# Patient Record
Sex: Female | Born: 1950
Health system: Southern US, Community
[De-identification: ages and names within clinical notes are randomized; demographics above are authoritative.]

## PROBLEM LIST (undated history)

## (undated) DIAGNOSIS — C7951 Secondary malignant neoplasm of bone: Secondary | ICD-10-CM

## (undated) DIAGNOSIS — C801 Malignant (primary) neoplasm, unspecified: Secondary | ICD-10-CM

## (undated) DIAGNOSIS — I10 Essential (primary) hypertension: Secondary | ICD-10-CM

## (undated) DIAGNOSIS — E785 Hyperlipidemia, unspecified: Secondary | ICD-10-CM

## (undated) DIAGNOSIS — Z923 Personal history of irradiation: Secondary | ICD-10-CM

## (undated) DIAGNOSIS — M1990 Unspecified osteoarthritis, unspecified site: Secondary | ICD-10-CM

## (undated) DIAGNOSIS — C50919 Malignant neoplasm of unspecified site of unspecified female breast: Secondary | ICD-10-CM

## (undated) HISTORY — DX: Malignant (primary) neoplasm, unspecified: C80.1

## (undated) HISTORY — DX: Essential (primary) hypertension: I10

## (undated) HISTORY — DX: Hyperlipidemia, unspecified: E78.5

## (undated) HISTORY — DX: Unspecified osteoarthritis, unspecified site: M19.90

## (undated) HISTORY — PX: CHOLECYSTECTOMY: SHX55

## (undated) HISTORY — PX: PORTACATH PLACEMENT: SHX2246

## (undated) HISTORY — PX: BREAST SURGERY: SHX581

## (undated) HISTORY — PX: PORT-A-CATH REMOVAL: SHX5289

---

## 2002-01-18 HISTORY — PX: TOTAL HIP ARTHROPLASTY: SHX124

## 2002-01-19 ENCOUNTER — Encounter: Payer: Self-pay | Admitting: Orthopedic Surgery

## 2002-01-19 ENCOUNTER — Inpatient Hospital Stay (HOSPITAL_COMMUNITY): Admission: RE | Admit: 2002-01-19 | Discharge: 2002-01-22 | Payer: Self-pay | Admitting: Orthopedic Surgery

## 2005-01-18 HISTORY — PX: COLONOSCOPY: SHX174

## 2006-01-18 HISTORY — PX: REPLACEMENT TOTAL KNEE: SUR1224

## 2006-01-18 HISTORY — PX: OTHER SURGICAL HISTORY: SHX169

## 2006-12-28 ENCOUNTER — Inpatient Hospital Stay (HOSPITAL_COMMUNITY): Admission: RE | Admit: 2006-12-28 | Discharge: 2007-01-01 | Payer: Self-pay | Admitting: Orthopedic Surgery

## 2008-01-19 HISTORY — PX: MASTECTOMY: SHX3

## 2008-04-25 ENCOUNTER — Encounter: Admission: RE | Admit: 2008-04-25 | Discharge: 2008-04-25 | Payer: Self-pay | Admitting: Surgery

## 2008-04-29 ENCOUNTER — Ambulatory Visit (HOSPITAL_BASED_OUTPATIENT_CLINIC_OR_DEPARTMENT_OTHER): Admission: RE | Admit: 2008-04-29 | Discharge: 2008-04-30 | Payer: Self-pay | Admitting: Surgery

## 2008-04-29 ENCOUNTER — Encounter (INDEPENDENT_AMBULATORY_CARE_PROVIDER_SITE_OTHER): Payer: Self-pay | Admitting: Surgery

## 2008-05-07 ENCOUNTER — Ambulatory Visit: Payer: Self-pay | Admitting: Oncology

## 2008-05-14 ENCOUNTER — Ambulatory Visit: Admission: RE | Admit: 2008-05-14 | Discharge: 2008-07-25 | Payer: Self-pay | Admitting: Radiation Oncology

## 2008-05-30 ENCOUNTER — Ambulatory Visit (HOSPITAL_COMMUNITY): Admission: RE | Admit: 2008-05-30 | Discharge: 2008-05-30 | Payer: Self-pay | Admitting: Oncology

## 2008-06-11 ENCOUNTER — Encounter: Payer: Self-pay | Admitting: Oncology

## 2008-06-11 ENCOUNTER — Ambulatory Visit: Admission: RE | Admit: 2008-06-11 | Discharge: 2008-06-11 | Payer: Self-pay | Admitting: Oncology

## 2008-06-28 ENCOUNTER — Ambulatory Visit: Payer: Self-pay | Admitting: Oncology

## 2008-07-01 ENCOUNTER — Ambulatory Visit (HOSPITAL_COMMUNITY): Admission: RE | Admit: 2008-07-01 | Discharge: 2008-07-01 | Payer: Self-pay | Admitting: Surgery

## 2008-07-12 LAB — CBC WITH DIFFERENTIAL/PLATELET
BASO%: 0.5 % (ref 0.0–2.0)
Basophils Absolute: 0.1 10e3/uL (ref 0.0–0.1)
EOS%: 2 % (ref 0.0–7.0)
Eosinophils Absolute: 0.2 10e3/uL (ref 0.0–0.5)
HCT: 35.5 % (ref 34.8–46.6)
HGB: 11.4 g/dL — ABNORMAL LOW (ref 11.6–15.9)
LYMPH%: 17.3 % (ref 14.0–49.7)
MCH: 26.4 pg (ref 25.1–34.0)
MCHC: 32.1 g/dL (ref 31.5–36.0)
MCV: 82.2 fL (ref 79.5–101.0)
MONO#: 1.1 10e3/uL — ABNORMAL HIGH (ref 0.1–0.9)
MONO%: 12 % (ref 0.0–14.0)
NEUT#: 6.4 10e3/uL (ref 1.5–6.5)
NEUT%: 68.2 % (ref 38.4–76.8)
Platelets: 164 10e3/uL (ref 145–400)
RBC: 4.32 10e6/uL (ref 3.70–5.45)
RDW: 14.2 % (ref 11.2–14.5)
WBC: 9.3 10e3/uL (ref 3.9–10.3)
lymph#: 1.6 10e3/uL (ref 0.9–3.3)

## 2008-07-19 LAB — COMPREHENSIVE METABOLIC PANEL
AST: 18 U/L (ref 0–37)
BUN: 29 mg/dL — ABNORMAL HIGH (ref 6–23)
CO2: 25 mEq/L (ref 19–32)
Calcium: 9.7 mg/dL (ref 8.4–10.5)
Chloride: 105 mEq/L (ref 96–112)
Creatinine, Ser: 1.12 mg/dL (ref 0.40–1.20)

## 2008-07-19 LAB — CBC WITH DIFFERENTIAL/PLATELET
Basophils Absolute: 0 10*3/uL (ref 0.0–0.1)
EOS%: 0.2 % (ref 0.0–7.0)
HCT: 34.8 % (ref 34.8–46.6)
HGB: 11.6 g/dL (ref 11.6–15.9)
MCH: 27.3 pg (ref 25.1–34.0)
NEUT%: 82.9 % — ABNORMAL HIGH (ref 38.4–76.8)
Platelets: 246 10*3/uL (ref 145–400)
lymph#: 1.2 10*3/uL (ref 0.9–3.3)

## 2008-07-23 LAB — CBC WITH DIFFERENTIAL/PLATELET
Basophils Absolute: 0 10*3/uL (ref 0.0–0.1)
Eosinophils Absolute: 0 10*3/uL (ref 0.0–0.5)
HCT: 34.3 % — ABNORMAL LOW (ref 34.8–46.6)
HGB: 11.1 g/dL — ABNORMAL LOW (ref 11.6–15.9)
MONO#: 1 10*3/uL — ABNORMAL HIGH (ref 0.1–0.9)
NEUT%: 80 % — ABNORMAL HIGH (ref 38.4–76.8)
WBC: 11.9 10*3/uL — ABNORMAL HIGH (ref 3.9–10.3)
lymph#: 1.3 10*3/uL (ref 0.9–3.3)

## 2008-08-08 ENCOUNTER — Ambulatory Visit: Payer: Self-pay | Admitting: Oncology

## 2008-08-13 LAB — CBC WITH DIFFERENTIAL/PLATELET
BASO%: 0.1 % (ref 0.0–2.0)
EOS%: 1.8 % (ref 0.0–7.0)
HGB: 11.6 g/dL (ref 11.6–15.9)
MCH: 26.5 pg (ref 25.1–34.0)
MCHC: 32.2 g/dL (ref 31.5–36.0)
MCV: 82.4 fL (ref 79.5–101.0)
NEUT#: 7.4 10*3/uL — ABNORMAL HIGH (ref 1.5–6.5)
NEUT%: 76.2 % (ref 38.4–76.8)
Platelets: 182 10*3/uL (ref 145–400)
RBC: 4.37 10*6/uL (ref 3.70–5.45)
WBC: 9.7 10*3/uL (ref 3.9–10.3)
lymph#: 1.1 10*3/uL (ref 0.9–3.3)

## 2008-08-13 LAB — COMPREHENSIVE METABOLIC PANEL
Albumin: 3.9 g/dL (ref 3.5–5.2)
BUN: 20 mg/dL (ref 6–23)
Calcium: 8.9 mg/dL (ref 8.4–10.5)
Chloride: 107 mEq/L (ref 96–112)
Glucose, Bld: 92 mg/dL (ref 70–99)
Potassium: 4.1 mEq/L (ref 3.5–5.3)

## 2008-09-03 LAB — CBC WITH DIFFERENTIAL/PLATELET
Basophils Absolute: 0 10*3/uL (ref 0.0–0.1)
Eosinophils Absolute: 0.2 10*3/uL (ref 0.0–0.5)
HCT: 34.8 % (ref 34.8–46.6)
HGB: 11.2 g/dL — ABNORMAL LOW (ref 11.6–15.9)
LYMPH%: 10.5 % — ABNORMAL LOW (ref 14.0–49.7)
MONO#: 0.9 10*3/uL (ref 0.1–0.9)
NEUT#: 6.5 10*3/uL (ref 1.5–6.5)
NEUT%: 76.1 % (ref 38.4–76.8)
Platelets: 189 10*3/uL (ref 145–400)
WBC: 8.5 10*3/uL (ref 3.9–10.3)
nRBC: 0 % (ref 0–0)

## 2008-09-04 ENCOUNTER — Ambulatory Visit: Payer: Self-pay | Admitting: Oncology

## 2008-09-10 ENCOUNTER — Ambulatory Visit: Admission: RE | Admit: 2008-09-10 | Discharge: 2008-11-18 | Payer: Self-pay | Admitting: Radiation Oncology

## 2008-10-04 ENCOUNTER — Ambulatory Visit: Payer: Self-pay | Admitting: Genetic Counselor

## 2008-10-04 ENCOUNTER — Ambulatory Visit: Payer: Self-pay | Admitting: Oncology

## 2008-10-04 LAB — CBC WITH DIFFERENTIAL/PLATELET
BASO%: 0.2 % (ref 0.0–2.0)
Basophils Absolute: 0 10*3/uL (ref 0.0–0.1)
EOS%: 4 % (ref 0.0–7.0)
HGB: 11.2 g/dL — ABNORMAL LOW (ref 11.6–15.9)
MCH: 28.8 pg (ref 25.1–34.0)
RDW: 19.7 % — ABNORMAL HIGH (ref 11.2–14.5)
lymph#: 0.5 10*3/uL — ABNORMAL LOW (ref 0.9–3.3)

## 2008-10-18 DIAGNOSIS — Z923 Personal history of irradiation: Secondary | ICD-10-CM

## 2008-10-18 HISTORY — DX: Personal history of irradiation: Z92.3

## 2008-10-21 ENCOUNTER — Encounter: Admission: RE | Admit: 2008-10-21 | Discharge: 2008-12-17 | Payer: Self-pay | Admitting: Radiation Oncology

## 2008-11-07 ENCOUNTER — Ambulatory Visit: Payer: Self-pay | Admitting: Oncology

## 2008-11-11 LAB — CBC WITH DIFFERENTIAL/PLATELET
BASO%: 0.1 % (ref 0.0–2.0)
EOS%: 2 % (ref 0.0–7.0)
HCT: 35.2 % (ref 34.8–46.6)
MCH: 31.3 pg (ref 25.1–34.0)
MCHC: 33.5 g/dL (ref 31.5–36.0)
NEUT%: 80.3 % — ABNORMAL HIGH (ref 38.4–76.8)
RBC: 3.77 10*6/uL (ref 3.70–5.45)
RDW: 13.4 % (ref 11.2–14.5)
lymph#: 0.6 10*3/uL — ABNORMAL LOW (ref 0.9–3.3)

## 2008-12-10 ENCOUNTER — Emergency Department (HOSPITAL_COMMUNITY): Admission: EM | Admit: 2008-12-10 | Discharge: 2008-12-10 | Payer: Self-pay | Admitting: Emergency Medicine

## 2009-01-03 ENCOUNTER — Ambulatory Visit: Payer: Self-pay | Admitting: Oncology

## 2009-07-04 ENCOUNTER — Ambulatory Visit: Payer: Self-pay | Admitting: Oncology

## 2010-01-05 ENCOUNTER — Ambulatory Visit: Payer: Self-pay | Admitting: Oncology

## 2010-04-27 LAB — DIFFERENTIAL
Basophils Absolute: 0 10*3/uL (ref 0.0–0.1)
Eosinophils Relative: 6 % — ABNORMAL HIGH (ref 0–5)
Lymphocytes Relative: 20 % (ref 12–46)
Monocytes Absolute: 0.5 10*3/uL (ref 0.1–1.0)

## 2010-04-27 LAB — COMPREHENSIVE METABOLIC PANEL
AST: 21 U/L (ref 0–37)
Albumin: 3.3 g/dL — ABNORMAL LOW (ref 3.5–5.2)
Alkaline Phosphatase: 116 U/L (ref 39–117)
Chloride: 108 mEq/L (ref 96–112)
GFR calc Af Amer: >60 mL/min (ref >60)
Potassium: 4.1 mEq/L (ref 3.5–5.1)
Sodium: 141 mEq/L (ref 135–145)
Total Bilirubin: 0.3 mg/dL (ref 0.3–1.2)

## 2010-04-27 LAB — CBC
Platelets: 252 10*3/uL (ref 150–400)
WBC: 7.3 10*3/uL (ref 4.0–10.5)

## 2010-04-27 LAB — GLUCOSE, CAPILLARY

## 2010-04-29 LAB — COMPREHENSIVE METABOLIC PANEL
ALT: 27 U/L (ref 0–35)
AST: 27 U/L (ref 0–37)
Alkaline Phosphatase: 108 U/L (ref 39–117)
CO2: 26 mEq/L (ref 19–32)
Chloride: 107 mEq/L (ref 96–112)
Creatinine, Ser: 1.06 mg/dL (ref 0.4–1.2)
GFR calc Af Amer: >60 mL/min (ref >60)
GFR calc non Af Amer: 53 mL/min — ABNORMAL LOW (ref >60)
Potassium: 4.8 mEq/L (ref 3.5–5.1)
Sodium: 141 mEq/L (ref 135–145)
Total Bilirubin: 0.4 mg/dL (ref 0.3–1.2)

## 2010-04-29 LAB — DIFFERENTIAL
Basophils Absolute: 0 10*3/uL (ref 0.0–0.1)
Basophils Relative: 0 % (ref 0–1)
Eosinophils Absolute: 0.3 10*3/uL (ref 0.0–0.7)
Eosinophils Relative: 5 % (ref 0–5)

## 2010-04-29 LAB — CBC
MCV: 90.3 fL (ref 78.0–100.0)
RBC: 4.27 MIL/uL (ref 3.87–5.11)
WBC: 5.9 10*3/uL (ref 4.0–10.5)

## 2010-04-29 LAB — GLUCOSE, CAPILLARY: Glucose-Capillary: 107 mg/dL — ABNORMAL HIGH (ref 70–99)

## 2010-05-21 ENCOUNTER — Encounter (INDEPENDENT_AMBULATORY_CARE_PROVIDER_SITE_OTHER): Payer: Self-pay | Admitting: Surgery

## 2010-06-02 NOTE — Op Note (Signed)
[name], [name]              ACCOUNT NO.:  [id number]   MEDICAL RECORD NO.:  [id number]          PATIENT TYPE:  AMB   LOCATION:  DAY                          FACILITY:  [facility]   PHYSICIAN:  Thomas A. Cornett, M.D.DATE OF BIRTH:  September 28, 1950   DATE OF PROCEDURE:  07/01/2008  DATE OF DISCHARGE:                               OPERATIVE REPORT   PREOPERATIVE DIAGNOSES:  Stage II breast cancer with poor venous access.   POSTOPERATIVE DIAGNOSES:  Stage II breast cancer with poor venous  access.   PROCEDURE:  Placement of right subclavian 8-French Power Port-A-Cath  under fluoroscopy guidance.   SURGEON:  Maisie Fus A. Cornett, M.D.   ANESTHESIA:  LMA of 0.25% Sensorcaine local.   ESTIMATED BLOOD LOSS:  20 mL.   SPECIMEN:  None.   INDICATIONS FOR PROCEDURE:  The patient is a 60 year old female with  stage II breast cancer.  She needs venous access for chemotherapy.  She  presents today for that.  Informed consent was obtained in the office  with her.   DESCRIPTION OF PROCEDURE:  The patient was brought to the operating  room.  She was placed supine.  After induction of LMA anesthesia, both  arms were tucked and the upper chest regions bilaterally were prepped  and draped in sterile fashion.  She was then placed in Trendelenburg.  The right subclavian vein was cannulated easily and the wire was  threaded through the needle without difficulty.  The patient was then  flattened out.  The wire was going into the subclavian vein down to the  superior vena cava and inferior vena cava.  A small incision was made at  the wire entrance site.  A second incision was made below that of about  3 cm.  A small cavity was created with the cautery.  A 8-French Power  Port-A-Cath was brought into the field, attached and then placed in the  wound and placed inside the pocket.  It was then tunneled from the lower  to the upper incision.  It was cut to about 20 cm.  The patient was  placed back in  Trendelenburg.  The dilator and introducer complex was  then fed over the wire moving the wire to-and-fro without difficulty.  Once the introducer was in place the dilator and wire were removed.  The  catheter was then placed within the peel-away sheath and it was peeled  away without difficulty.  The tip the catheter was then examined by  fluoroscopy.  Given her body habitus it was very difficult.  This  appeared to be in the superior vena cava and right atrium junction.  The  catheter was secured to the chest wall with 2-0 Prolene.  We were able  to draw back from the port without difficulty, getting dark non-  pulsatile blood and then flushing it with heparinized saline without  difficulty.  The 5 mL of 100 units/mL of heparinized saline were then  placed in the catheter.  We then closed the incision with a deep layer  of 3-0 Vicryl and subsequent 4-0 Monocryl stitch.  I then accessed  the  catheter with the [facility] needle and this drew back easily and flushed  easily.  This was secured to the skin with Steri-Strips.  Dermabond was placed over both incisions.  All final counts of sponge,  needle and instruments were found to be correct at this portion of the  case.  Sterile dressings were applied.  All final counts were found be  correct x2.  The patient was awoken and taken to recovery where a chest  x-ray will be obtained.  She was in stable condition.      Thomas A. Cornett, M.D.  Electronically Signed     TAC/MEDQ  D:  07/01/2008  T:  07/01/2008  Job:  [id number]   cc:   Leighton Roach. Truett Perna, M.D.  Fax: [phone]

## 2010-06-02 NOTE — Op Note (Signed)
[name], [name]              ACCOUNT NO.:  [id number]   MEDICAL RECORD NO.:  [id number]          PATIENT TYPE:  AMB   LOCATION:  DSC                          FACILITY:  [facility]   PHYSICIAN:  Thomas A. Cornett, M.D.DATE OF BIRTH:  02/18/1950   DATE OF PROCEDURE:  DATE OF DISCHARGE:                               OPERATIVE REPORT   PREOPERATIVE DIAGNOSIS:  Left breast cancer.   POSTOPERATIVE DIAGNOSIS:  Left breast cancer.   PROCEDURE:  1. Left modified radical mastectomy.  2. Left sentinel lymph node mapping with injection of methylene blue      dye.   SURGEON:  Maisie Fus A. Cornett, MD   ANESTHESIA:  LMA.   ESTIMATED BLOOD LOSS:  150 mL.   DRAINS:  A 19-Blake drains to left mastectomy wound.   SPECIMEN:  Left breast with axillary contents to pathology.   INDICATIONS FOR PROCEDURE:  The patient is a 60 year old female who has  two large foci of breast cancer in her left breast.  These were too far  apart for breast conserving means and she presents today for a left  simple mastectomy with sentinel lymph node mapping.  We discussed the  procedure preoperatively and the risk of bleeding, infection, wound  complications, poor wound healing, and other complications related to  surgery including DVT, with pulmonary embolus, myocardial infarction,  stroke, injury to other neighboring organs.  She understood all the  above potential complications and agreed to proceed.   DESCRIPTION OF PROCEDURE:  The patient was brought to the operating room  and placed supine.  After induction of LMA anesthesia, 4 mL of methylene  blue dye were injected in a subareolar position and massaged for 5  minutes after sterile prep.  Once this was done the left breast and left  axilla were prepped and draped in a sterile fashion.  She was injected  with technetium sulfur colloid in the holding area by Radiology.  She  had a very large breast that extended laterally.  Curvilinear incisions  were made  above and below the nipple areolar complex.  Superior skin  flap was taken all the way up to just above the clavicle.  Inferior skin  flap was taken down below the inframammary fold.  The breast was then  excised in a medial-to-lateral fashion off the pectoralis fascia.  The  axilla was entered and a NEO-prep was used to identify the sentinel  node.  This was both blue and hot in the left axilla.  It was found to  be positive.  At this point, we conducted a left axillary node  dissection.  The axillary vein was identified.  The long thoracic nerve  was also identified.  We identified the thoracodorsal trunk.  All  lymphovascular issue between these landmarks were removed with the  specimen passed off the field.  They were all preserved.  Clips were  used to control any small lymphatics or any small veins.  The  intercostal nerves were divided.  We inspected the axillary fossa and it  was dry.  She had a significant dog ear on this side  of the wound due to  her morbid obesity and I was able to trim the skin down as far as I  could to limit the superior inferior skin flaps to make this as flat as  we could without any of the tissue rolling up or bulging.  Irrigation  was used and suctioned out.  We used two separate stab incisions and 19-  Blake drains were placed.  She had a very large breast.  Therefore,  incision extended out well into the axilla to encompass all this and  just to the midline.  We closed the wound in layers, the deep layer with  of 3-0 Vicryl and subsequent 3-0 Monocryl layer.  Dermabond was applied.  Drains were secured to the skin with drain stitches of two 3-0 nylon and  placed to bulb suction.  Dermabond was applied.  All final counts of  sponge, needle, and instruments were found to be correct at this portion  of the case.  The patient was awoken and taken to recovery in  satisfactory condition.      Thomas A. Cornett, M.D.  Electronically Signed      TAC/MEDQ  D:  04/29/2008  T:  04/30/2008  Job:  [id number]   cc:   Mady Gemma, MD

## 2010-06-02 NOTE — Op Note (Signed)
NAMELISETH, WANN              ACCOUNT NO.:  [id number]   MEDICAL RECORD NO.:  [id number]          PATIENT TYPE:  INP   LOCATION:  2550                         FACILITY:  [facility]   PHYSICIAN:  Nadara Mustard, MD     DATE OF BIRTH:  04/29/1950   DATE OF PROCEDURE:  12/28/2006  DATE OF DISCHARGE:                               OPERATIVE REPORT   PREOPERATIVE DIAGNOSIS:  PVNS with degenerative arthritis, right knee.   POSTOPERATIVE DIAGNOSIS:  PVNS with degenerative arthritis, right knee.   PROCEDURE:  Right total knee arthroplasty with synovectomy with DePuy  components, 2.5 femur, 2.5 tibia, with a 10-mm poly tray and a 35-mm  patella.   SURGEON:  Nadara Mustard, MD   ANESTHESIA:  General plus femoral block.   ESTIMATED BLOOD LOSS:  Minimal.   ANTIBIOTICS:  2 grams of Kefzol.   DRAINS:  None.   COMPLICATIONS:  None.   TOURNIQUET TIME:  300 mmHg at approximately 45 minutes to the thigh.   DISPOSITION:  To PACU in stable condition.   INDICATIONS FOR PROCEDURE:  The patient is a 60 year old woman with PVNS  of her knee.  She has had degenerative arthritic changes secondary to  the PVNS.  She is status post arthroscopic debridement.  She has pain  with activities of daily living and presents at this time for total knee  replacement.  Risks and benefits were discussed including infection,  neurovascular injury, persistent pain, need for additional surgery.  The  patient states she understands and wished proceed at this time.   PROCEDURE:  The patient was brought to OR room 4 after undergoing a  femoral block.  She then underwent general anesthetic.  After adequate  level of anesthesia obtained the patient's right lower extremity was  prepped using DuraPrep, draped into a sterile field.  An Collier Flowers was used  to cover all exposed skin.  The knee was flexed and the tourniquet  inflated to 300 mmHg.  A midline incision was made.  This carried down  to the medial parapatellar  retinacular incision.  The patella was  everted and visualization showed a massive synovitis of all  compartments.  The patient underwent a complete synovectomy of the  joint.  The drill was then used for the IM guide up the femur with it  set to 5 degrees of valgus for the right knee.  This distal cutting  block was then placed and 11 mm was taken off the distal femur.  This  was sized for 2.5 and 2.5 cutting block was placed and the chamfer cuts  were made.  Attention was then focused on the tibia.  The external  alignment guide was used and with neutral posterior slope and neutral  varus and valgus alignment with the tibial cut was made at 10 mm.  This  was then sized for 2.5 and 2.5 trial tower and keel punches were made  and the tibial component was left in place.  Attention was then focused  on the femur.  The box cut was then made for the femur.  The trial  femoral component was placed.  The peg holes were drilled and this was  tried with a 10-mm poly tray.  The patient had full extension, stable  varus and valgus alignment.  The trial instruments removed.  The knee  was irrigated with pulse lavage.  The patella was then resurfaced and 10  mm was taken off the patella.  This was sized for a 35 and the peg cuts  were made for the 35-mm patella.  The knee was again irrigated with  pulse lavage.  The tibial and then femoral components were cemented in  place.  The loose cement was removed and the poly tray was placed after  again irrigation and the knee was kept in extension until the cement  hardened.  The patella button was placed and this was left with the  clamp in place after cement was removed and this was also left in place  until the cement hardened.  The knee was then placed through full range  of motion. There was no lateral tracking of the patella.  No lateral  tilt.  The patient's knee was stable.  The tourniquet was deflated and  hemostasis was obtained.  The posterior  capsule of the joint was  injected with total 60 mL of 0.25% Marcaine plain.  The retinacular  incision was closed using #1 Vicryl, the subcu was closed using 2-0  Vicryl.  Skin was closed using Proximate staples.  The wound was covered  Adaptic orthopedic sponges, ABD dressing, Webril and Coban dressing.  The patient was placed in easy wrap ice pack, extubated and taken to  PACU in stable condition      Nadara Mustard, MD  Electronically Signed     MVD/MEDQ  D:  12/28/2006  T:  12/28/2006  Job:  [id number]

## 2010-06-05 NOTE — H&P (Signed)
NAME:  [name], CHIEFFO                        ACCOUNT NO.:  [id number]   MEDICAL RECORD NO.:  [id number]                   PATIENT TYPE:  INP   LOCATION:  2899                                 FACILITY:  [facility]   PHYSICIAN:  Nadara Mustard, M.D.                DATE OF BIRTH:  07/12/1950   DATE OF ADMISSION:  01/19/2002  DATE OF DISCHARGE:                                HISTORY & PHYSICAL   HISTORY OF PRESENT ILLNESS:  The patient is a 60 year old woman with a  chronic right hip pain.  The patient has a decreased range of motion, has  chronic pain with activities of daily living.  The patient complains of  weakness, inability to ambulate, and presents at this time for a total hip  arthroplasty after a failure of conservative care, anti-inflammatories, and  cortisone injections.   ALLERGIES:  No known drug allergies.   MEDICATIONS:  Include Accupril, Vicodin, Flexeril, and glipizide, as well as  sulfasalizine and Arava.   PAST MEDICAL HISTORY:  Positive for cholecystectomy in 1990.   SOCIAL HISTORY:  Negative for tobacco, negative for alcohol.  She is  married.   FAMILY HISTORY:  Positive for diabetes, heart disease, and hypertension.   REVIEW OF SYSTEMS:  Positive for rheumatoid arthritis, diabetes type 2, and  hypertension.   PHYSICAL EXAMINATION:  VITAL SIGNS:  Temperature 98.1, pulse 64, respiratory  rate 16, blood pressure 142/90.  GENERAL:  She is well-developed, well-nourished, morbidly obese.  NECK:  Supple, no bruits.  LUNGS:  Clear to auscultation.  CARDIOVASCULAR:  Regular rate and rhythm.  RIGHT LOWER EXTREMITY:  She has approximately 10 to 20 degrees of external  rotation which is extremely painful.  She has 0 degrees of internal  rotation.  She has flexion of 90 degrees.  She has pain with straightening  of her hip.  She has a shortening of the right leg by about 1/4 to 1/2 inch.  She has no pain to palpation around the right knee.   IMAGING STUDIES:  She  does have an MRI scan of her right knee which does  show a meniscal tear but she is asymptomatic to palpation and range of  motion of the knee.  Radiographs of the right hip shows severe arthritic  changes with subchondral cysts and bone-on-bone contact.   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    PLAN:  The patient is scheduled for a right total hip arthroplasty at this  time.  The risks and benefits were discussed including infection,  neurovascular injury, persistent pain, injury to the sciatic nerve,  dislocation of the hip, DVT, pulmonary embolus and death.  The patient  states she understands and wishes to proceed at this time.  Nadara Mustard, M.D.    MVD/MEDQ  D:  01/19/2002  T:  01/19/2002  Job:  [id number]

## 2010-06-05 NOTE — Op Note (Signed)
NAME:  [name], [name]                        ACCOUNT NO.:  [id number]   MEDICAL RECORD NO.:  [id number]                   PATIENT TYPE:  INP   LOCATION:  2899                                 FACILITY:  [facility]   PHYSICIAN:  Nadara Mustard, M.D.                DATE OF BIRTH:  1950/03/20   DATE OF PROCEDURE:  01/19/2002  DATE OF DISCHARGE:                                 OPERATIVE REPORT   PREOPERATIVE DIAGNOSIS:  Osteoarthritis right hip versus rheumatoid  arthritis.   POSTOPERATIVE DIAGNOSIS:  Osteoarthritis right hip versus rheumatoid  arthritis.   OPERATION PERFORMED:  Right total hip arthroplasty with a #7 press-fit  secure fit stem, 32 mm ceramic ball with a 48 mm acetabulum with a 10 degree  poly liner.   SURGEON:  Nadara Mustard, M.D.   ANESTHESIA:  General.   ESTIMATED BLOOD LOSS:  300 cc.   ANTIBIOTICS:  1 gm of Kefzol.   DISPOSITION:  To PACU in stable condition.   INDICATIONS FOR PROCEDURE:  The patient is a 60 year old woman with severe  arthritic changes of her right hip.  The patient has failed conservative  care.  She is unable to perform activities of daily living and presents at  this time for total hip arthroplasty.  The risks and benefits were  discussed.  The patient states she understands and wishes to proceed at this  time.   DESCRIPTION OF PROCEDURE:  The patient was brought to operating room 5 and  underwent a general anesthetic.  After adequate level of anesthesia was  obtained, the patient was placed in left lateral decubitus position with the  right side up and the right lower extremity was prepped using DuraPrep,  draped into a sterile field and Ioban was used to cover all exposed skin.  A  posterolateral incision was made.  This was carried down to the tensor  fascia lata which was split.  A Charnley retractor was placed.  The sciatic  nerve was protected and was very close to the surgical field.  The short  external rotators and the  piriformis were tagged and retracted and were used  to protect the sciatic nerve.  The capsule was T-d and this was also tagged  and retracted.  The hip was dislocated and a femoral neck cut was made one  half inch proximal to the calcar.  Attention was first focused on the  acetabulum.  The patient had a significant amount of synovitis which was  debrided.  The acetabulum was sequentially reamed up to 48 mm and the 48 mm  acetabulum was inserted and the trial liner was placed.  Attention was then  focused on the femur.  The femur was sequentially reamed and broached up to  a #7.  This was then reduced with a  #7 trial and placed through a full  range of motion.  The patient had 90 degrees  of flexion, had 70 degrees of  internal rotation, was stable.  She also had full extension with external  rotation and this was also stable.  Leg lengths were equal.  The trials were  removed.  The centralizer plug was placed on the acetabulum.  The acetabular  liner was placed and the femoral canal was then reamed distally to 10.5 for  an 11 mm distal stem.  The stem was inserted as the ceramic head was  inserted and the hip was reduced.  The hip was again placed through a range  of motion and was stable.  The wound was irrigated throughout the case with  normal saline.  The deep fascia iliotibial band was closed using a #1  Vicryl.  The deep fascia was closed using a 2-0 Vicryl.  The superficial  fascia was closed with 2-0 Vicryl.  The skin was closed using Proximate  staples.  The wounds were covered with Adaptic orthopedic sponges, ABD  dressing and HypaFix tape.  The patient was extubated, transferred supine.  Knee immobilizer was applied and she was taken to PACU in stable condition.                                                Nadara Mustard, M.D.    MVD/MEDQ  D:  01/19/2002  T:  01/19/2002  Job:  [id number]

## 2010-06-05 NOTE — Discharge Summary (Signed)
   NAME:  [name], [name]                        ACCOUNT NO.:  [id number]   MEDICAL RECORD NO.:  [id number]                   PATIENT TYPE:  INP   LOCATION:  5037                                 FACILITY:  [facility]   PHYSICIAN:  Nadara Mustard, M.D.                DATE OF BIRTH:  12-26-1950   DATE OF ADMISSION:  01/19/2002  DATE OF DISCHARGE:  01/22/2002                                 DISCHARGE SUMMARY   DISPOSITION:  The patient is discharged to home in stable condition.   DIAGNOSIS:  Osteoarthritis of the right hip.   PROCEDURE:  Right total hip arthroplasty.   FOLLOW UP:  The patient is to follow-up in the office in two weeks.   HISTORY OF PRESENT ILLNESS:  The patient is a 60 year-old woman with chronic  right hip pain.  She has decreased range of motion and is unable to perform  activities of daily living.  She has failed conservative care and presents  at this time for a total hip arthroplasty.   HOSPITAL COURSE:  The patient underwent a right total hip arthroplasty on  January 19, 2002 with Osteonics components, #7 femur, 32 mm ceramic ball with  a 48 mm acetabulum.  She was treated with Kefzol for infection prophylaxis  and Coumadin for DVT prophylaxis.  She progressed well with physical therapy  and was discharged to home in stable condition on January 22, 2002 with home  physical therapy, weight bearing as tolerated, discharge on low dose  Coumadin with follow-up in the office in two weeks.                                               Nadara Mustard, M.D.    MVD/MEDQ  D:  02/22/2002  T:  02/22/2002  Job:  [id number]

## 2010-07-07 ENCOUNTER — Encounter (HOSPITAL_BASED_OUTPATIENT_CLINIC_OR_DEPARTMENT_OTHER): Payer: 59 | Admitting: Oncology

## 2010-07-07 DIAGNOSIS — M069 Rheumatoid arthritis, unspecified: Secondary | ICD-10-CM

## 2010-07-07 DIAGNOSIS — Z170 Estrogen receptor positive status [ER+]: Secondary | ICD-10-CM

## 2010-07-07 DIAGNOSIS — E119 Type 2 diabetes mellitus without complications: Secondary | ICD-10-CM

## 2010-07-07 DIAGNOSIS — C50919 Malignant neoplasm of unspecified site of unspecified female breast: Secondary | ICD-10-CM

## 2010-10-26 LAB — BASIC METABOLIC PANEL
BUN: 19
CO2: 24
CO2: 28
Calcium: 8.7
Calcium: 9
Chloride: 104
Chloride: 104
Chloride: 106
Creatinine, Ser: 0.92
Creatinine, Ser: 1.06
GFR calc Af Amer: >60
GFR calc non Af Amer: >60
Glucose, Bld: 121 — ABNORMAL HIGH
Glucose, Bld: 134 — ABNORMAL HIGH
Potassium: 4.4
Sodium: 136
Sodium: 139
Sodium: 141

## 2010-10-26 LAB — CBC
HCT: 26.5 — ABNORMAL LOW
HCT: 27.3 — ABNORMAL LOW
HCT: 33.8 — ABNORMAL LOW
Hemoglobin: 11.3 — ABNORMAL LOW
Hemoglobin: 8.1 — ABNORMAL LOW
MCHC: 32.8
MCHC: 33.1
MCV: 80.8
MCV: 81.3
MCV: 81.7
Platelets: 234
Platelets: 281
RDW: 17.8 — ABNORMAL HIGH
RDW: 17.9 — ABNORMAL HIGH
RDW: 18 — ABNORMAL HIGH
WBC: 11.1 — ABNORMAL HIGH
WBC: 8.8

## 2010-10-26 LAB — PROTIME-INR
INR: 1.4
INR: 2.4 — ABNORMAL HIGH
Prothrombin Time: 23.6 — ABNORMAL HIGH

## 2010-10-27 ENCOUNTER — Encounter (INDEPENDENT_AMBULATORY_CARE_PROVIDER_SITE_OTHER): Payer: Self-pay | Admitting: Surgery

## 2010-11-30 ENCOUNTER — Ambulatory Visit (INDEPENDENT_AMBULATORY_CARE_PROVIDER_SITE_OTHER): Payer: Self-pay | Admitting: Surgery

## 2011-01-01 ENCOUNTER — Encounter (INDEPENDENT_AMBULATORY_CARE_PROVIDER_SITE_OTHER): Payer: Self-pay | Admitting: Surgery

## 2011-01-01 ENCOUNTER — Ambulatory Visit (INDEPENDENT_AMBULATORY_CARE_PROVIDER_SITE_OTHER): Payer: 59 | Admitting: Surgery

## 2011-01-01 VITALS — BP 128/74 | HR 76 | Temp 97.4°F | Resp 12 | Ht 63.25 in | Wt 250.6 lb

## 2011-01-01 DIAGNOSIS — Z853 Personal history of malignant neoplasm of breast: Secondary | ICD-10-CM

## 2011-01-01 NOTE — Patient Instructions (Signed)
Follow up 1 year 

## 2011-01-01 NOTE — Progress Notes (Signed)
Subjective:     Patient ID: [name], female   DOB: 01-03-1951, 60 y.o.   MRN: [id number]  HPIThe patient returns today due to history of stage II left breast cancer. Date of surgery was 2010 and she underwent a left modified radical mastectomy. She completed chemotherapy. She is doing well except for small area of skin irritation along the mastectomy wound. This is been present for 2 weeks.  Review of Systems  Constitutional: Negative for fever, chills and unexpected weight change.  HENT: Negative for hearing loss, congestion, sore throat, trouble swallowing and voice change.   Eyes: Negative for visual disturbance.  Respiratory: Negative for cough and wheezing.   Cardiovascular: Negative for chest pain, palpitations and leg swelling.  Gastrointestinal: Negative for nausea, vomiting, abdominal pain, diarrhea, constipation, blood in stool, abdominal distention and anal bleeding.  Genitourinary: Negative for hematuria, vaginal bleeding and difficulty urinating.  Musculoskeletal: Negative for arthralgias.  Skin: Negative for rash and wound.  Neurological: Negative for seizures, syncope and headaches.  Hematological: Negative for adenopathy. Does not bruise/bleed easily.  Psychiatric/Behavioral: Negative for confusion.       Objective:   Physical Exam  Constitutional: She is oriented to person, place, and time. She appears well-developed and well-nourished.  HENT:  Head: Normocephalic and atraumatic.  Eyes: EOM are normal. Pupils are equal, round, and reactive to light.  Neck: Normal range of motion. Neck supple.  Pulmonary/Chest:       Left breast surgically absent. No mass. Right breast normal.  Musculoskeletal: Normal range of motion.  Neurological: She is alert and oriented to person, place, and time.       Assessment:     Stage II left breast cancer history    Plan:     Return to clinic one year. Her rheumatologist wants to start  embrel. This will be up to Dr.  Elnita Maxwell and the patient

## 2011-01-05 ENCOUNTER — Other Ambulatory Visit: Payer: Self-pay | Admitting: *Deleted

## 2011-01-05 ENCOUNTER — Ambulatory Visit (HOSPITAL_BASED_OUTPATIENT_CLINIC_OR_DEPARTMENT_OTHER): Payer: 59 | Admitting: Oncology

## 2011-01-05 VITALS — BP 119/63 | HR 87 | Temp 97.0°F | Ht 63.25 in | Wt 249.5 lb

## 2011-01-05 DIAGNOSIS — C50919 Malignant neoplasm of unspecified site of unspecified female breast: Secondary | ICD-10-CM

## 2011-01-05 DIAGNOSIS — Z79811 Long term (current) use of aromatase inhibitors: Secondary | ICD-10-CM

## 2011-01-05 DIAGNOSIS — Z9010 Acquired absence of unspecified breast and nipple: Secondary | ICD-10-CM

## 2011-01-05 DIAGNOSIS — Z9221 Personal history of antineoplastic chemotherapy: Secondary | ICD-10-CM

## 2011-01-05 MED ORDER — ANASTROZOLE 1 MG PO TABS: 1.0000 mg | ORAL_TABLET | Freq: Every day | ORAL | Status: DC

## 2011-01-05 NOTE — Progress Notes (Signed)
OFFICE PROGRESS NOTE   INTERVAL HISTORY:   She continues  arimidex. She exercises regularly. She does not have significant hot flashes or arthralgias. The rheumatoid arthritis is currently under good control. She has no new complaint.  Objective:  Vital signs in last 24 hours:  Blood pressure 119/63, pulse 87, temperature 97 F (36.1 C), temperature source Oral, height 5' 3.25" (1.607 m), weight 249 lb 8 oz (113.172 kg).    HEENT: Neck without mass Lymphatics: No cervical, supraclavicular, or axillary lymph nodes Resp: Lungs clear bilaterally Cardio: Regular rate and rhythm GI: No hepato- Vascular:  No leg edema, mild edema of the left arm  Breasts: Status post left mastectomy. No evidence for chest wall tumor recurrence. Right breast without mass.    Medications: I have reviewed the patient's current medications.  Assessment/Plan: 1. 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  She completed 4 cycles of adjuvant AC chemotherapy June 15 through September 03, 2008, and then completed the left chest wall radiation.  She began Arimidex following an office visit on November 11, 2008. 2. Left chest subcutaneous mass noted on exam October 04, 2008, status post biopsy by Dr. Luisa Hart with a benign pathology. 3. History of delayed healing of the left mastectomy incision. 4. Rheumatoid arthritis. 5. Diabetes. 6. Hypercholesterolemia. 7. Hypertension. 8. Family history of breast cancer.  She has been evaluated at the genetic screening clinic.  Disposition:  She remains in clinical remission from breast cancer. She will continue Arimidex. She is scheduled for a return visit in 6 months.   Lucile Shutters, MD  01/05/2011  8:59 PM

## 2011-01-06 ENCOUNTER — Telehealth: Payer: Self-pay | Admitting: Oncology

## 2011-01-06 NOTE — Telephone Encounter (Signed)
[facility] advising the pt of her f/u appt in June 2013

## 2011-01-21 ENCOUNTER — Other Ambulatory Visit: Payer: Self-pay | Admitting: *Deleted

## 2011-01-21 DIAGNOSIS — C50919 Malignant neoplasm of unspecified site of unspecified female breast: Secondary | ICD-10-CM

## 2011-01-21 DIAGNOSIS — C50419 Malignant neoplasm of upper-outer quadrant of unspecified female breast: Secondary | ICD-10-CM

## 2011-01-21 MED ORDER — ANASTROZOLE 1 MG PO TABS: 1.0000 mg | ORAL_TABLET | Freq: Every day | ORAL | Status: DC

## 2011-05-28 ENCOUNTER — Encounter (INDEPENDENT_AMBULATORY_CARE_PROVIDER_SITE_OTHER): Payer: Self-pay

## 2011-07-06 ENCOUNTER — Telehealth: Payer: Self-pay | Admitting: Oncology

## 2011-07-06 ENCOUNTER — Ambulatory Visit (HOSPITAL_BASED_OUTPATIENT_CLINIC_OR_DEPARTMENT_OTHER): Payer: 59 | Admitting: Nurse Practitioner

## 2011-07-06 VITALS — BP 125/66 | HR 86 | Temp 98.1°F | Ht 63.25 in | Wt 254.8 lb

## 2011-07-06 DIAGNOSIS — E785 Hyperlipidemia, unspecified: Secondary | ICD-10-CM

## 2011-07-06 DIAGNOSIS — E119 Type 2 diabetes mellitus without complications: Secondary | ICD-10-CM

## 2011-07-06 DIAGNOSIS — M069 Rheumatoid arthritis, unspecified: Secondary | ICD-10-CM

## 2011-07-06 DIAGNOSIS — C50919 Malignant neoplasm of unspecified site of unspecified female breast: Secondary | ICD-10-CM

## 2011-07-06 NOTE — Progress Notes (Signed)
OFFICE PROGRESS NOTE  Interval history:  [name] returns as scheduled. She continues Arimidex. She denies significant hot flashes or arthralgias. She feels the rheumatoid arthritis is under good control. She continues methotrexate. No unusual areas of pain. She has a good appetite. No nausea or vomiting. Bowels moving regularly. Her main complaint is fatigue. She relates the fatigue to traveling.   Objective: Blood pressure 125/66, pulse 86, temperature 98.1 F (36.7 C), temperature source Oral, height 5' 3.25" (1.607 m), weight 254 lb 12.8 oz (115.577 kg).  Oropharynx is without thrush or ulceration. No palpable cervical, supraclavicular or axillary lymph nodes. Status post left mastectomy. No evidence for chest wall tumor recurrence. No mass palpated in the right breast. Lungs clear. Regular cardiac rhythm. Abdomen is soft, obese. No obvious hepatomegaly. Legs without edema.  Lab Results: Lab Results  Component Value Date   WBC 7.3 11/11/2008   HGB 11.8 11/11/2008   HCT 35.2 11/11/2008   MCV 93.3 11/11/2008   PLT 209 11/11/2008    Chemistry:    Chemistry      Component Value Date/Time   NA 138 08/13/2008 1026   K 4.1 08/13/2008 1026   CL 107 08/13/2008 1026   CO2 24 08/13/2008 1026   BUN 20 08/13/2008 1026   CREATININE 1.13 08/13/2008 1026      Component Value Date/Time   CALCIUM 8.9 08/13/2008 1026   ALKPHOS 106 08/13/2008 1026   AST 31 08/13/2008 1026   ALT 32 08/13/2008 1026   BILITOT 0.5 08/13/2008 1026       Studies/Results: Mammogram 05/06/2011 with no significant abnormality or change.  Medications: I have reviewed the patient's current medications.  Assessment/Plan:  1. 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 She completed 4 cycles of adjuvant AC chemotherapy June 15 through September 03, 2008, and then completed the left chest wall  radiation. She began Arimidex following an office visit on November 11, 2008. 2. Left chest subcutaneous mass noted on exam October 04, 2008, status post biopsy by Dr. Luisa Hart with a benign pathology. 3. History of delayed healing of the left mastectomy incision. 4. Rheumatoid arthritis. 5. Diabetes. 6. Hypercholesterolemia. 7. Hypertension. 8. Family history of breast cancer. She has been evaluated at the genetic screening clinic.  Disposition-[name] appears stable. She remains in clinical remission from the breast cancer. She continues Arimidex. She will return for a followup visit in 6 months. She will contact the office in the interim with any problems.  Plan reviewed with Dr. Truett Perna.  [name] ANP/GNP-BC

## 2011-07-06 NOTE — Telephone Encounter (Signed)
Gave pt appt  Calendar for December 2013 MD only

## 2011-12-30 ENCOUNTER — Ambulatory Visit: Payer: 59 | Admitting: Oncology

## 2012-01-04 ENCOUNTER — Telehealth: Payer: Self-pay | Admitting: Oncology

## 2012-01-04 ENCOUNTER — Ambulatory Visit (HOSPITAL_BASED_OUTPATIENT_CLINIC_OR_DEPARTMENT_OTHER): Payer: 59 | Admitting: Oncology

## 2012-01-04 VITALS — BP 128/68 | HR 84 | Temp 96.6°F | Resp 20 | Ht 63.25 in | Wt 266.4 lb

## 2012-01-04 DIAGNOSIS — Z9010 Acquired absence of unspecified breast and nipple: Secondary | ICD-10-CM

## 2012-01-04 DIAGNOSIS — C50919 Malignant neoplasm of unspecified site of unspecified female breast: Secondary | ICD-10-CM | POA: Insufficient documentation

## 2012-01-04 DIAGNOSIS — L539 Erythematous condition, unspecified: Secondary | ICD-10-CM

## 2012-01-04 DIAGNOSIS — Z803 Family history of malignant neoplasm of breast: Secondary | ICD-10-CM

## 2012-01-04 MED ORDER — ANASTROZOLE 1 MG PO TABS: 1.0000 mg | ORAL_TABLET | Freq: Every day | ORAL | Status: DC

## 2012-01-04 NOTE — Progress Notes (Signed)
   [facility]    OFFICE PROGRESS NOTE   INTERVAL HISTORY:   She returns as scheduled. She continues Arimidex. No new complaint. No change at the chest wall. She continues to have a few small open areas at the left posterior lateral chest wall. She is scheduled to see Dr. Luisa Hart next month. A right mammogram was negative on 05/06/2011  Objective:  Vital signs in last 24 hours:  Blood pressure 128/68, pulse 84, temperature 96.6 F (35.9 C), temperature source Oral, resp. rate 20, height 5' 3.25" (1.607 m), weight 266 lb 6.4 oz (120.838 kg).    HEENT: Neck without mass Lymphatics: No cervical, supraclavicular, or axillary nodes Resp: Lungs clear bilaterally Cardio: Regular rate and rhythm GI: No hepatomegaly Vascular: No leg edema Breasts: Right breast without mass. Status post left mastectomy. No evidence for chest wall tumor recurrence.  Skin: There are 2-3  1 cm erythematous areas with superficial ulceration at the left posterior lateral chest wall.    Medications: I have reviewed the patient's current medications.  Assessment/Plan: 1. 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 She completed 4 cycles of adjuvant AC chemotherapy June 15 through September 03, 2008, and then completed the left chest wall radiation. She began Arimidex following an office visit on November 11, 2008. 2. Left chest subcutaneous mass noted on exam October 04, 2008, status post biopsy by Dr. Luisa Hart with a benign pathology. 3. History of delayed healing of the left mastectomy incision. 4. Rheumatoid arthritis. 5. Diabetes. 6. Hypercholesterolemia. 7. Hypertension. 8. Family history of breast cancer. She has been evaluated at the genetic screening clinic.   Disposition:  She remains in clinical remission from breast cancer. She will continue Arimidex.  [name] will see Dr. Luisa Hart next month. I suspect the erythematous areas over the left chest wall are related to benign skin abrasions from her bra, potentially complicated by treatment for the rheumatoid arthritis. She will return for an office visit in 6 months. She will schedule a right mammogram for April 2014.   Thornton Papas, MD  01/04/2012  12:33 PM

## 2012-01-04 NOTE — Telephone Encounter (Signed)
gv and printed pt appt schedule for June 2014 °

## 2012-02-04 ENCOUNTER — Encounter (INDEPENDENT_AMBULATORY_CARE_PROVIDER_SITE_OTHER): Payer: Self-pay | Admitting: Surgery

## 2012-02-04 ENCOUNTER — Ambulatory Visit (INDEPENDENT_AMBULATORY_CARE_PROVIDER_SITE_OTHER): Payer: 59 | Admitting: Surgery

## 2012-02-04 VITALS — BP 128/84 | Temp 97.2°F | Ht 63.3 in | Wt 263.8 lb

## 2012-02-04 DIAGNOSIS — Z853 Personal history of malignant neoplasm of breast: Secondary | ICD-10-CM | POA: Insufficient documentation

## 2012-02-04 NOTE — Progress Notes (Signed)
Subjective:     Patient ID: [name], female   DOB: 1950-06-28, 62 y.o.   MRN: [id number]  HPIThe patient returns today due to history of stage II left breast cancer. Date of surgery was 2010 and she underwent a left modified radical mastectomy. She completed chemotherapy. She is doing well except for small area of skin irritation along the mastectomy wound. This is been present for 2 weeks.  Review of Systems  Constitutional: Negative for fever, chills and unexpected weight change.  HENT: Negative for hearing loss, congestion, sore throat, trouble swallowing and voice change.   Eyes: Negative for visual disturbance.  Respiratory: Negative for cough and wheezing.   Cardiovascular: Negative for chest pain, palpitations and leg swelling.  Gastrointestinal: Negative for nausea, vomiting, abdominal pain, diarrhea, constipation, blood in stool, abdominal distention and anal bleeding.  Genitourinary: Negative for hematuria, vaginal bleeding and difficulty urinating.  Musculoskeletal: Negative for arthralgias.  Skin: Negative for rash and wound.  Neurological: Negative for seizures, syncope and headaches.  Hematological: Negative for adenopathy. Does not bruise/bleed easily.  Psychiatric/Behavioral: Negative for confusion.       Objective:   Physical Exam  Constitutional: She is oriented to person, place, and time. She appears well-developed and well-nourished.  HENT:  Head: Normocephalic and atraumatic.  Eyes: EOM are normal. Pupils are equal, round, and reactive to light.  Neck: Normal range of motion. Neck supple.  Pulmonary/Chest:       Left breast surgically absent. No mass. Right breast normal.  Musculoskeletal: Normal range of motion.  Neurological: She is alert and oriented to person, place, and time.  Mammogram UTD.  Due in April 2014       Assessment:     Stage II left breast cancer history    Plan:     Return to clinic one year.

## 2012-02-04 NOTE — Patient Instructions (Signed)
Return as needed

## 2012-06-20 ENCOUNTER — Telehealth: Payer: Self-pay | Admitting: Oncology

## 2012-06-20 ENCOUNTER — Ambulatory Visit (HOSPITAL_BASED_OUTPATIENT_CLINIC_OR_DEPARTMENT_OTHER): Payer: 59 | Admitting: Oncology

## 2012-06-20 VITALS — BP 138/80 | HR 88 | Temp 96.8°F | Resp 19 | Ht 63.0 in | Wt 262.2 lb

## 2012-06-20 DIAGNOSIS — Z803 Family history of malignant neoplasm of breast: Secondary | ICD-10-CM

## 2012-06-20 DIAGNOSIS — C50912 Malignant neoplasm of unspecified site of left female breast: Secondary | ICD-10-CM

## 2012-06-20 DIAGNOSIS — C50919 Malignant neoplasm of unspecified site of unspecified female breast: Secondary | ICD-10-CM

## 2012-06-20 DIAGNOSIS — C773 Secondary and unspecified malignant neoplasm of axilla and upper limb lymph nodes: Secondary | ICD-10-CM

## 2012-06-20 DIAGNOSIS — I10 Essential (primary) hypertension: Secondary | ICD-10-CM

## 2012-06-20 DIAGNOSIS — E119 Type 2 diabetes mellitus without complications: Secondary | ICD-10-CM

## 2012-06-20 NOTE — Telephone Encounter (Signed)
gv and printed appt sched and avs for pt  °

## 2012-06-20 NOTE — Progress Notes (Signed)
   [facility]    OFFICE PROGRESS NOTE   INTERVAL HISTORY:   She returns as scheduled. She continues Arimidex. No change at the chest wall. She is scheduled for a right mammogram on 06/22/2012. She continues an exercise program.  Objective:  Vital signs in last 24 hours:  Blood pressure 138/80, pulse 88, temperature 96.8 F (36 C), temperature source Oral, resp. rate 19, height 5\' 3"  (1.6 m), weight 262 lb 3.2 oz (118.933 kg).    HEENT: Neck without mass Lymphatics: No cervical, supraclavicular, or axillary nodes Resp: end inspiratory bronchial sounds bilaterally, no respiratory distress Cardio: Regular rate and rhythm GI: No hepatomegaly Vascular: No leg edema Breasts: Status post left mastectomy. No evidence for chest wall tumor recurrence. Right breast without mass.    Medications: I have reviewed the patient's current medications.  Assessment/Plan: 1. 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 She completed 4 cycles of adjuvant AC chemotherapy June 15 through September 03, 2008, and then completed the left chest wall radiation. She began Arimidex following an office visit on November 11, 2008. 2. Left chest subcutaneous mass noted on exam October 04, 2008, status post biopsy by Dr. Luisa Hart with a benign pathology. 3. History of delayed healing of the left mastectomy incision. 4. Rheumatoid arthritis. 5. Diabetes. 6. Hypercholesterolemia. 7. Hypertension. 8. Family history of breast cancer. She has been evaluated at the genetic screening clinic.  Disposition:  She remains in clinical remission from breast cancer. She will have a right mammogram later this week. Ms. Vicie Mutters will return for an office visit in 6 months. The plan is to continue Arimidex for at least 5 years.   Thornton Papas, MD  06/20/2012  10:41 AM

## 2012-07-19 ENCOUNTER — Encounter: Payer: Self-pay | Admitting: Oncology

## 2012-12-21 ENCOUNTER — Ambulatory Visit (HOSPITAL_BASED_OUTPATIENT_CLINIC_OR_DEPARTMENT_OTHER): Payer: 59 | Admitting: Oncology

## 2012-12-21 ENCOUNTER — Telehealth: Payer: Self-pay | Admitting: Oncology

## 2012-12-21 VITALS — BP 122/66 | HR 96 | Temp 97.1°F | Resp 18 | Ht 63.0 in | Wt 254.4 lb

## 2012-12-21 DIAGNOSIS — C773 Secondary and unspecified malignant neoplasm of axilla and upper limb lymph nodes: Secondary | ICD-10-CM

## 2012-12-21 DIAGNOSIS — C50912 Malignant neoplasm of unspecified site of left female breast: Secondary | ICD-10-CM

## 2012-12-21 DIAGNOSIS — E119 Type 2 diabetes mellitus without complications: Secondary | ICD-10-CM

## 2012-12-21 DIAGNOSIS — Z803 Family history of malignant neoplasm of breast: Secondary | ICD-10-CM

## 2012-12-21 DIAGNOSIS — Z170 Estrogen receptor positive status [ER+]: Secondary | ICD-10-CM

## 2012-12-21 DIAGNOSIS — I10 Essential (primary) hypertension: Secondary | ICD-10-CM

## 2012-12-21 DIAGNOSIS — C50919 Malignant neoplasm of unspecified site of unspecified female breast: Secondary | ICD-10-CM

## 2012-12-21 NOTE — Progress Notes (Signed)
   [name]    OFFICE PROGRESS NOTE   INTERVAL HISTORY:   She returns for scheduled followup of breast cancer. She continues Arimidex. A right mammogram on 06/22/2012 was negative. No hot flashes. She continues to have stiffness and arthritis pain. Good appetite and energy level. She exercises regularly. Objective:  Vital signs in last 24 hours:  Blood pressure 122/66, pulse 96, temperature 97.1 F (36.2 C), temperature source Oral, resp. rate 18, height 5\' 3"  (1.6 m), weight 254 lb 6.4 oz (115.395 kg).    HEENT: Neck without mass Lymphatics: No cervical, supraclavicular, or axillary nodes Resp: Lungs clear bilaterally Cardio: Regular rate and rhythm GI: No hepatomegaly Vascular: No leg edema  Breasts: Status post left mastectomy. No evidence for chest wall tumor recurrence. Right breast without mass.     Medications: I have reviewed the patient's current medications.  Assessment/Plan: 1. 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 She completed 4 cycles of adjuvant AC chemotherapy June 15 through September 03, 2008, and then completed the left chest wall radiation. She began Arimidex following an office visit on November 11, 2008. 2. Left chest subcutaneous mass noted on exam October 04, 2008, status post biopsy by Dr. Luisa Hart with a benign pathology. 3. History of delayed healing of the left mastectomy incision. 4. Rheumatoid arthritis. 5. Diabetes. 6. Hypercholesterolemia. 7. Hypertension. 8. Family history of breast cancer. She has been evaluated at the genetic screening clinic.  Disposition:  [name] remains in clinical remission from breast cancer. She will continue Arimidex. She will return for an office visit in 9 months. She is scheduled for a right mammogram in June of 2015.   Thornton Papas, MD  12/21/2012    8:48 AM

## 2012-12-21 NOTE — Telephone Encounter (Signed)
appt made per 12/4 POF AVS and CAL given shh

## 2013-01-02 ENCOUNTER — Other Ambulatory Visit: Payer: Self-pay | Admitting: Oncology

## 2013-01-02 DIAGNOSIS — C50919 Malignant neoplasm of unspecified site of unspecified female breast: Secondary | ICD-10-CM

## 2013-01-22 ENCOUNTER — Encounter (INDEPENDENT_AMBULATORY_CARE_PROVIDER_SITE_OTHER): Payer: Self-pay | Admitting: Surgery

## 2013-01-22 ENCOUNTER — Encounter (INDEPENDENT_AMBULATORY_CARE_PROVIDER_SITE_OTHER): Payer: Self-pay

## 2013-01-22 ENCOUNTER — Ambulatory Visit (INDEPENDENT_AMBULATORY_CARE_PROVIDER_SITE_OTHER): Payer: 59 | Admitting: Surgery

## 2013-01-22 VITALS — BP 132/80 | HR 98 | Temp 98.0°F | Resp 18 | Ht 63.75 in | Wt 251.0 lb

## 2013-01-22 DIAGNOSIS — Z853 Personal history of malignant neoplasm of breast: Secondary | ICD-10-CM

## 2013-01-22 NOTE — Progress Notes (Signed)
Subjective:     Patient ID: [name], female   DOB: 1950-04-13, 63 y.o.   MRN: [id number]  [place] patient returns today due to history of stage II left breast cancer. Date of surgery was 2010 and she underwent a left modified radical mastectomy. She completed chemotherapy. She is doing well except for small area of skin irritation along the mastectomy wound. Doing well.   Review of Systems  Constitutional: Negative for fever, chills and unexpected weight change.  HENT: Negative for hearing loss, congestion, sore throat, trouble swallowing and voice change.   Eyes: Negative for visual disturbance.  Respiratory: Negative for cough and wheezing.   Cardiovascular: Negative for chest pain, palpitations and leg swelling.  Gastrointestinal: Negative for nausea, vomiting, abdominal pain, diarrhea, constipation, blood in stool, abdominal distention and anal bleeding.  Genitourinary: Negative for hematuria, vaginal bleeding and difficulty urinating.  Musculoskeletal: Negative for arthralgias.  Skin: Negative for rash and wound.  Neurological: Negative for seizures, syncope and headaches.  Hematological: Negative for adenopathy. Does not bruise/bleed easily.  Psychiatric/Behavioral: Negative for confusion.       Objective:   Physical Exam  Constitutional: She is oriented to person, place, and time. She appears well-developed and well-nourished.  HENT:  Head: Normocephalic and atraumatic.  Eyes: EOM are normal. Pupils are equal, round, and reactive to light.  Neck: Normal range of motion. Neck supple.  Pulmonary/Chest:       Left breast surgically absent. No mass. Right breast normal.  Musculoskeletal: Normal range of motion.  Neurological: She is alert and oriented to person, place, and time.  Mammogram UTD.  Due in April 2015      Assessment:     Stage II left breast cancer history    Plan:     Pt is released at this point.  Continue oncology follow up as directed.

## 2013-01-22 NOTE — Patient Instructions (Signed)
You are released. Return as needed.

## 2013-05-21 ENCOUNTER — Other Ambulatory Visit (HOSPITAL_COMMUNITY): Payer: Self-pay | Admitting: Rheumatology

## 2013-05-21 ENCOUNTER — Telehealth: Payer: Self-pay | Admitting: *Deleted

## 2013-05-21 NOTE — Telephone Encounter (Signed)
Received message from pt stating "My rheumatologist Dr. Kathi Ludwig wants to order a bone scan since my alkaline level has increased; in Jan it was 1.48 and now it's 1.59.  Is this OK with Dr. Benay Spice?'  Note to MD.

## 2013-05-22 NOTE — Telephone Encounter (Signed)
Returned patient call, per Dr Benay Spice, we are not too clear on the previous lab values that patient left on voicemail, however he did say that if Dr Estanislado Pandy is concerned of cancer in her bones, he does not feel that she is of high risk for metastatic disease. We can not say that patient is completely resistant to bone mets, but it is not likely this is the cause of elevated Alk Phos levels. Patient is going to call the rheumotologist back and post pone this Bone Scan until her labs are drawn again. Reviewed contributing factors for elevated levels, patient will also ask to have Vit D levels evaluated at that time. It is also time for her every 2 year bone density. She will be intouch if needed or any further questions. Gave fax number to patient to have her next labs faxed to our office for our records.

## 2013-06-01 ENCOUNTER — Ambulatory Visit (HOSPITAL_COMMUNITY)
Admission: RE | Admit: 2013-06-01 | Discharge: 2013-06-01 | Disposition: A | Discharge status: Home / Self Care | Payer: 59 | Source: Ambulatory Visit | Attending: Rheumatology | Admitting: Rheumatology

## 2013-06-01 ENCOUNTER — Encounter (HOSPITAL_COMMUNITY)
Admission: RE | Admit: 2013-06-01 | Discharge: 2013-06-01 | Disposition: A | Discharge status: Home / Self Care | Payer: 59 | Source: Ambulatory Visit | Attending: Rheumatology | Admitting: Rheumatology

## 2013-06-01 DIAGNOSIS — M545 Low back pain, unspecified: Secondary | ICD-10-CM | POA: Insufficient documentation

## 2013-06-01 MED ORDER — TECHNETIUM TC 99M MEDRONATE IV KIT
26.5000 | PACK | Freq: Once | INTRAVENOUS | Status: AC | PRN
  Administered 2013-06-01: 26.5 via INTRAVENOUS

## 2013-06-04 ENCOUNTER — Telehealth: Payer: Self-pay | Admitting: *Deleted

## 2013-06-04 NOTE — Telephone Encounter (Signed)
Pt called states "Just wanted Dr. Benay Spice to know that they (Dr. Arlean Hopping office ) will be sending bone scan results; reports 3 suspicious areas that they want Dr. Benay Spice to look at"  Pt states she is anxious to know what MD thinks about this and her labs -alkaline phosphatase- level high.  Note to Dr Benay Spice.

## 2013-06-08 ENCOUNTER — Telehealth: Payer: Self-pay | Admitting: *Deleted

## 2013-06-08 NOTE — Telephone Encounter (Signed)
Per Dr. Benay Spice; notified pt that shoulder likely arthritc, right hip may be related to prosthesis; could order MRI T-Spine to make sure.  Pt verbalized understanding and states she does want to have an MRI.  Dr. Benay Spice made aware of request.

## 2013-06-15 ENCOUNTER — Telehealth: Payer: Self-pay | Admitting: Oncology

## 2013-06-15 ENCOUNTER — Other Ambulatory Visit: Payer: Self-pay | Admitting: Oncology

## 2013-06-15 DIAGNOSIS — C50919 Malignant neoplasm of unspecified site of unspecified female breast: Secondary | ICD-10-CM

## 2013-06-15 NOTE — Telephone Encounter (Signed)
, °

## 2013-06-26 ENCOUNTER — Other Ambulatory Visit: Payer: Self-pay | Admitting: Oncology

## 2013-06-26 ENCOUNTER — Ambulatory Visit
Admission: RE | Admit: 2013-06-26 | Discharge: 2013-06-26 | Disposition: A | Discharge status: Home / Self Care | Payer: 59 | Source: Ambulatory Visit | Attending: Oncology | Admitting: Oncology

## 2013-06-26 DIAGNOSIS — C50919 Malignant neoplasm of unspecified site of unspecified female breast: Secondary | ICD-10-CM

## 2013-06-27 ENCOUNTER — Other Ambulatory Visit: Payer: Self-pay | Admitting: *Deleted

## 2013-06-27 ENCOUNTER — Telehealth: Payer: Self-pay | Admitting: Oncology

## 2013-06-27 NOTE — Telephone Encounter (Signed)
added appt for 6/16 - s/w pt she is aware.

## 2013-07-03 ENCOUNTER — Telehealth: Payer: Self-pay | Admitting: Oncology

## 2013-07-03 ENCOUNTER — Ambulatory Visit (HOSPITAL_BASED_OUTPATIENT_CLINIC_OR_DEPARTMENT_OTHER): Payer: 59 | Admitting: Oncology

## 2013-07-03 ENCOUNTER — Ambulatory Visit (HOSPITAL_BASED_OUTPATIENT_CLINIC_OR_DEPARTMENT_OTHER): Payer: 59

## 2013-07-03 VITALS — BP 140/92 | HR 108 | Temp 98.0°F | Resp 20 | Ht 63.75 in | Wt 233.5 lb

## 2013-07-03 DIAGNOSIS — Z803 Family history of malignant neoplasm of breast: Secondary | ICD-10-CM

## 2013-07-03 DIAGNOSIS — C773 Secondary and unspecified malignant neoplasm of axilla and upper limb lymph nodes: Secondary | ICD-10-CM

## 2013-07-03 DIAGNOSIS — M549 Dorsalgia, unspecified: Secondary | ICD-10-CM

## 2013-07-03 DIAGNOSIS — I10 Essential (primary) hypertension: Secondary | ICD-10-CM

## 2013-07-03 DIAGNOSIS — E119 Type 2 diabetes mellitus without complications: Secondary | ICD-10-CM

## 2013-07-03 DIAGNOSIS — C50919 Malignant neoplasm of unspecified site of unspecified female breast: Secondary | ICD-10-CM

## 2013-07-03 DIAGNOSIS — E785 Hyperlipidemia, unspecified: Secondary | ICD-10-CM

## 2013-07-03 DIAGNOSIS — M899 Disorder of bone, unspecified: Secondary | ICD-10-CM

## 2013-07-03 DIAGNOSIS — M949 Disorder of cartilage, unspecified: Secondary | ICD-10-CM

## 2013-07-03 DIAGNOSIS — M069 Rheumatoid arthritis, unspecified: Secondary | ICD-10-CM

## 2013-07-03 LAB — CBC WITH DIFFERENTIAL/PLATELET
BASO%: 0.3 % (ref 0.0–2.0)
Basophils Absolute: 0 10*3/uL (ref 0.0–0.1)
EOS ABS: 0.1 10*3/uL (ref 0.0–0.5)
EOS%: 2.6 % (ref 0.0–7.0)
HCT: 37.9 % (ref 34.8–46.6)
HGB: 12.1 g/dL (ref 11.6–15.9)
LYMPH%: 12.1 % — AB (ref 14.0–49.7)
MCH: 28.5 pg (ref 25.1–34.0)
MCHC: 31.8 g/dL (ref 31.5–36.0)
MCV: 89.6 fL (ref 79.5–101.0)
MONO#: 0.5 10*3/uL (ref 0.1–0.9)
MONO%: 10.5 % (ref 0.0–14.0)
NEUT%: 74.5 % (ref 38.4–76.8)
NEUTROS ABS: 3.8 10*3/uL (ref 1.5–6.5)
PLATELETS: 181 10*3/uL (ref 145–400)
RBC: 4.23 10*6/uL (ref 3.70–5.45)
RDW: 14.5 % (ref 11.2–14.5)
WBC: 5.1 10*3/uL (ref 3.9–10.3)
lymph#: 0.6 10*3/uL — ABNORMAL LOW (ref 0.9–3.3)

## 2013-07-03 LAB — COMPREHENSIVE METABOLIC PANEL (CC13)
ALT: 15 U/L (ref 0–55)
AST: 20 U/L (ref 5–34)
Albumin: 3.6 g/dL (ref 3.5–5.0)
Alkaline Phosphatase: 179 U/L — ABNORMAL HIGH (ref 40–150)
Anion Gap: 11 mEq/L (ref 3–11)
BILIRUBIN TOTAL: 0.25 mg/dL (ref 0.20–1.20)
BUN: 26.3 mg/dL — ABNORMAL HIGH (ref 7.0–26.0)
CALCIUM: 9.9 mg/dL (ref 8.4–10.4)
CO2: 18 meq/L — AB (ref 22–29)
CREATININE: 1.7 mg/dL — AB (ref 0.6–1.1)
Chloride: 110 mEq/L — ABNORMAL HIGH (ref 98–109)
GLUCOSE: 115 mg/dL (ref 70–140)
Potassium: 4.9 mEq/L (ref 3.5–5.1)
SODIUM: 139 meq/L (ref 136–145)
Total Protein: 7.2 g/dL (ref 6.4–8.3)

## 2013-07-03 MED ORDER — HYDROCODONE-ACETAMINOPHEN 5-325 MG PO TABS: 1.0000 | ORAL_TABLET | ORAL | Status: DC | PRN

## 2013-07-03 MED ORDER — ALPRAZOLAM 0.5 MG PO TABS: 0.5000 mg | ORAL_TABLET | Freq: Three times a day (TID) | ORAL | Status: DC | PRN

## 2013-07-03 NOTE — Telephone Encounter (Signed)
gv and printed appt sched and avs for pt for June and July...sent pt to lab

## 2013-07-03 NOTE — Progress Notes (Signed)
Patient became tearful as nurse was speaking with her today. Is anxious and afraid. Is able to talk with family, but admits she holds back a lot to protect them. She is asking for something to help with her anxiety. She also agrees to [employer] officer, museum referral to talk about her situation and emotions and non-medical ways to manage stress. Referral placed in EPIC.

## 2013-07-03 NOTE — Progress Notes (Signed)
[facility] OFFICE PROGRESS NOTE   Diagnosis: Breast cancer  INTERVAL HISTORY:   She is followed by Dr. Patrecia [name] for rheumatoid arthritis. The alkaline phosphatase was elevated on recent laboratory determinations. She was referred for a bone scan on 06/01/2013. New areas of osseous remodeling were noted in the T10 and T11 vertebrae and at the right inferior pubic ramus. I reviewed the bone scan with a radiologist. An MRI of the thoracic spine was recommended. The MRI without contrast on 06/26/2013 revealed metastatic disease in the thoracic spine. No fracture or cord compression. Tumor was noted throughout the T9 vertebral body. Extensive tumor was noted in the T10 vertebral body extending into the pedicle and posterior elements. Mild epidural tumor in the canal without deformity of the cord. Metastatic deposits were also noted T11 and L1.  I discussed the MRI findings with [name] by telephone last week. She continues Arimidex. She complains of pain in the mid and lower back that has been worse recently. She takes half of a hydrocodone tablet for partial relief of pain. No change over the chest wall. She lost weight intentionally with an exercise program, but has recently developed anorexia. She also reports balance difficulty.  Objective:  Vital signs in last 24 hours:  Blood pressure 140/92, pulse 108, temperature 98 F (36.7 C), temperature source Oral, resp. rate 20, height 5' 3.75" (1.619 m), weight 233 lb 8 oz (105.915 kg), SpO2 100.00%.    HEENT: Neck without mass Lymphatics: No cervical, supraclavicular, axillary, or inguinal nodes Resp: Lungs clear bilaterally Cardio: Regular rate and rhythm GI: No hepatomegaly, nontender Vascular: No leg edema Neuro: Alert and oriented, the motor exam appears intact in the upper and lower extremities. Finger to nose testing is normal. The gait is normal   Breasts: Right breast without mass, status post left mastectomy. No  evidence for chest wall tumor recurrence. Musculoskeletal: No spine tenderness   Imaging:  I reviewed these 06/26/2013 thoracic MRI and the 06/01/2013 bone scan with [name] and [name]  X-ray right hip 06/26/2013-total right hip placement, no acute abnormality  Medications: I have reviewed the patient's current medications.  Assessment/Plan: 1. 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 ER positive, PR positive, Sabrina-2 negative She completed 4 cycles of adjuvant AC chemotherapy June 15 through September 03, 2008, and then completed the left chest wall radiation. She began Arimidex following an office visit on November 11, 2008.  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 Left chest subcutaneous mass noted on exam October 04, 2008, status post biopsy by Dr. Brantley Stage with a benign pathology. 3. History of delayed healing of the left mastectomy incision. 4. Rheumatoid arthritis. 5. Diabetes. 6. Hypercholesterolemia. 7. Hypertension. 8. [name] history of breast cancer. She has been evaluated at the genetic screening clinic.   Disposition:  I discussed the x-ray findings and reviewed the images with [name] and [name]. She understands the clinical presentation is most consistent with metastatic breast cancer. The differential diagnosis includes another primary tumor or less likely a benign process. She will be scheduled for a staging brain MRI and PET scan. We will then choose a site to biopsy to confirm the diagnosis and check hormone receptor/Sabrina-2 studies.  She will return for an office visit 07/18/2013. She will discontinue Arimidex. Ms.  [name] knows to contact us for neurologic symptoms. I prescribed hydrocodone/APAP (5-3.5) to  use as needed for pain.  We will likely recommend second line hormonal therapy if she is found to have hormone receptor positive metastatic breast cancer.  Approximately 40 minutes were spent with patient today. The majority of time was used for counseling and coordination of care.  Sabrina Coder, MD  07/03/2013  11:14 AM

## 2013-07-04 ENCOUNTER — Other Ambulatory Visit: Payer: Self-pay | Admitting: *Deleted

## 2013-07-04 DIAGNOSIS — C50919 Malignant neoplasm of unspecified site of unspecified female breast: Secondary | ICD-10-CM

## 2013-07-04 LAB — CANCER ANTIGEN 27.29: CA 27.29: 84 U/mL — ABNORMAL HIGH (ref 0–39)

## 2013-07-04 NOTE — Progress Notes (Signed)
Changed MRI to W/O contrast only due to creatinine

## 2013-07-09 ENCOUNTER — Telehealth: Payer: Self-pay | Admitting: *Deleted

## 2013-07-09 ENCOUNTER — Encounter (HOSPITAL_COMMUNITY)
Admission: RE | Admit: 2013-07-09 | Discharge: 2013-07-09 | Disposition: A | Discharge status: Home / Self Care | Payer: 59 | Source: Ambulatory Visit | Attending: Oncology | Admitting: Oncology

## 2013-07-09 DIAGNOSIS — C50919 Malignant neoplasm of unspecified site of unspecified female breast: Secondary | ICD-10-CM | POA: Insufficient documentation

## 2013-07-09 LAB — GLUCOSE, CAPILLARY: Glucose-Capillary: 105 mg/dL — ABNORMAL HIGH (ref 70–99)

## 2013-07-09 MED ORDER — FLUDEOXYGLUCOSE F - 18 (FDG) INJECTION
13.6000 | Freq: Once | INTRAVENOUS | Status: AC | PRN
  Administered 2013-07-09: 13.6 via INTRAVENOUS

## 2013-07-09 NOTE — Telephone Encounter (Signed)
Per Dr. Benay Spice; notified pt that ca27.29 is elevated consistent with breast cancer. Pt verbalized understanding and confirmed appt for 07/18/13.

## 2013-07-09 NOTE — Telephone Encounter (Signed)
Message copied by Domenic Schwab on Mon Jul 09, 2013  4:36 PM ------      Message from: Betsy Coder B      Created: Wed Jul 04, 2013  8:52 AM       Please call patient, ca27.29 is elevated consistent with breast cancer ------

## 2013-07-10 ENCOUNTER — Other Ambulatory Visit: Payer: 59

## 2013-07-10 ENCOUNTER — Telehealth: Payer: Self-pay | Admitting: *Deleted

## 2013-07-10 ENCOUNTER — Ambulatory Visit
Admission: RE | Admit: 2013-07-10 | Discharge: 2013-07-10 | Disposition: A | Discharge status: Home / Self Care | Payer: 59 | Source: Ambulatory Visit | Attending: Oncology | Admitting: Oncology

## 2013-07-10 DIAGNOSIS — C50919 Malignant neoplasm of unspecified site of unspecified female breast: Secondary | ICD-10-CM

## 2013-07-10 NOTE — Telephone Encounter (Signed)
THE RESULTS WERE CALLED. THE REPORT WAS FAXED AND GIVEN TO DR.SHERRILL.

## 2013-07-10 NOTE — Telephone Encounter (Signed)
Message copied by Domenic Schwab on Tue Jul 10, 2013  3:38 PM ------      Message from: Betsy Coder B      Created: Mon Jul 09, 2013  8:01 PM       Please call patient, PET c/w multiple bone metastases, and a few mediastinal lymph nodes, no lung or liver lesions ------

## 2013-07-10 NOTE — Telephone Encounter (Signed)
Per Dr. Benay Spice; notified pt that PET c/w multiple bone metastases, and a few mediastinal lymph nodes, no lung or liver lesions.  MRI Head showed no intracranial intra-axial metastatic disease identified.  Pt verbalized understanding and expressed appreciation for call and confirmed appt for 07/18/13.

## 2013-07-18 ENCOUNTER — Telehealth: Payer: Self-pay | Admitting: Nurse Practitioner

## 2013-07-18 ENCOUNTER — Ambulatory Visit (HOSPITAL_BASED_OUTPATIENT_CLINIC_OR_DEPARTMENT_OTHER): Payer: 59 | Admitting: Oncology

## 2013-07-18 VITALS — BP 132/51 | HR 101 | Temp 97.5°F | Resp 20 | Ht 63.75 in | Wt 229.9 lb

## 2013-07-18 DIAGNOSIS — C50919 Malignant neoplasm of unspecified site of unspecified female breast: Secondary | ICD-10-CM

## 2013-07-18 DIAGNOSIS — I10 Essential (primary) hypertension: Secondary | ICD-10-CM

## 2013-07-18 DIAGNOSIS — Z803 Family history of malignant neoplasm of breast: Secondary | ICD-10-CM

## 2013-07-18 DIAGNOSIS — M79609 Pain in unspecified limb: Secondary | ICD-10-CM

## 2013-07-18 DIAGNOSIS — E119 Type 2 diabetes mellitus without complications: Secondary | ICD-10-CM

## 2013-07-18 DIAGNOSIS — M949 Disorder of cartilage, unspecified: Secondary | ICD-10-CM

## 2013-07-18 DIAGNOSIS — C50912 Malignant neoplasm of unspecified site of left female breast: Secondary | ICD-10-CM

## 2013-07-18 DIAGNOSIS — C773 Secondary and unspecified malignant neoplasm of axilla and upper limb lymph nodes: Secondary | ICD-10-CM

## 2013-07-18 DIAGNOSIS — M899 Disorder of bone, unspecified: Secondary | ICD-10-CM

## 2013-07-18 DIAGNOSIS — E785 Hyperlipidemia, unspecified: Secondary | ICD-10-CM

## 2013-07-18 DIAGNOSIS — M549 Dorsalgia, unspecified: Secondary | ICD-10-CM

## 2013-07-18 DIAGNOSIS — G893 Neoplasm related pain (acute) (chronic): Secondary | ICD-10-CM

## 2013-07-18 MED ORDER — HYDROCODONE-ACETAMINOPHEN 10-325 MG PO TABS
1.0000 | ORAL_TABLET | ORAL | Status: DC | PRN
Start: 2013-07-18 — End: 2013-07-18

## 2013-07-18 MED ORDER — HYDROCODONE-ACETAMINOPHEN 10-325 MG PO TABS: 1.0000 | ORAL_TABLET | ORAL | Status: DC | PRN

## 2013-07-18 NOTE — Progress Notes (Signed)
  [facility] OFFICE PROGRESS NOTE   Diagnosis: Breast cancer  INTERVAL HISTORY:   [name] returns as scheduled. She continues to have back pain and pain in the left hip and leg. No leg weakness. No difficulty with bowel or bladder function. Hydrocodone does not help the pain significantly. The back pain is constant.  Objective:  Vital signs in last 24 hours:  Blood pressure 132/51, pulse 101, temperature 97.5 F (36.4 C), temperature source Oral, resp. rate 20, height 5' 3.75" (1.619 m), weight 229 lb 14.4 oz (104.282 kg).   Musculoskeletal: No spine tenderness. No pain with motion at the left hip.    Lab Results:  Lab Results  Component Value Date   WBC 5.1 07/03/2013   HGB 12.1 07/03/2013   HCT 37.9 07/03/2013   MCV 89.6 07/03/2013   PLT 181 07/03/2013   NEUTROABS 3.8 07/03/2013   CA 27-29-84  Imaging: PET scan 07/09/2013-hypermetabolic mediastinal nodes, no abnormal hypermetabolism in the liver, adrenal glands, spleen, or pancreas. No hypermetabolic abdomen/pelvis nodes. Numerous hypermetabolic bone lesions including lesions at the spine, pelvis, and scapula.  I reviewed the PET images with the [name] and her family  Medications: I have reviewed the patient's current medications.  Assessment/Plan: 1. 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 ER positive, PR positive, HER-2 negative She completed 4 cycles of adjuvant AC chemotherapy June 15 through September 03, 2008, and then completed the left chest wall radiation. She began Arimidex following an office visit on November 11, 2008. Bone scan 06/01/2013 suggestive of thoracic spine metastases, thoracic MRI 06/27/2011 consistent with multiple bone metastases involving the thoracolumbar spine PET scan 07/09/2013 with multiple hypermetabolic bone lesions and hypermetabolic  mediastinal nodes 2. Left chest subcutaneous mass noted on exam October 04, 2008, status post biopsy by Dr. Brantley Stage with a benign pathology. 3. History of delayed healing of the left mastectomy incision. 4. Rheumatoid arthritis. 5. Diabetes. 6. Hypercholesterolemia. 7. Hypertension. 8. Family history of breast cancer. She has been evaluated at the genetic screening clinic. 9. Pain secondary to metastatic breast cancer involving the spine   Disposition:  [name] appears to have metastatic breast cancer. I reviewed the PET images and discuss treatment options with [name] and her family. She will be referred for a biopsy of one of the bone lesions to confirm a diagnosis of metastatic breast cancer and performed ER/PR and if possible HER-2 testing.  We will decide on systemic therapy based on the biopsy findings.  She is symptomatic with pain at the lower back and left leg. The leg pain may be radicular pain from a lesion at the lumbar spine. I will refer her to radiation oncology to consider palliative radiation to the cervical, thoracic, and lumbar spine. We increased the dose of hydrocodone to use as needed for pain. She knows to contact us for neurologic symptoms.  [name] will return for an office visit and xygeva therapy 07/28/2011. We reviewed the potential toxicities associated with xygeva including osteonecrosis, hypocalcemia, flulike symptoms, and renal toxicity. She agrees to proceed.  Sabrina Coder, MD  07/18/2013  2:56 PM

## 2013-07-18 NOTE — Telephone Encounter (Signed)
, °

## 2013-07-19 ENCOUNTER — Other Ambulatory Visit: Payer: Self-pay | Admitting: *Deleted

## 2013-07-19 DIAGNOSIS — C50912 Malignant neoplasm of unspecified site of left female breast: Secondary | ICD-10-CM

## 2013-07-19 NOTE — Progress Notes (Signed)
Patient is scheduled to see Dr. Isidore Moos 07/27/13. Thanks, Santiago Glad

## 2013-07-24 ENCOUNTER — Other Ambulatory Visit: Payer: Self-pay | Admitting: Radiology

## 2013-07-25 ENCOUNTER — Encounter: Payer: Self-pay | Admitting: Radiation Oncology

## 2013-07-25 NOTE — Progress Notes (Signed)
Histology and Location of Primary Cancer: Breast: diagnosed in 2010  Sites of Visceral and Bony Metastatic Disease: Back pain and pain in the lumbar spine, left hip, and leg. No leg weakness. No difficulty with bowel or bladder function. Hydrocodone does not help the pain significantly. The back pain is constant.     Bone scan 06/01/2013 suggestive of thoracic spine metastases, thoracic MRI 06/27/2011 consistent with multiple bone metastases involving the thoracolumbar spine PET scan 07/09/2013 with multiple hypermetabolic bone lesions and hypermetabolic mediastinal nodes :  Original Biopsy:  1. SENTINEL NODE #1: METASTATIC CARCINOMA  2. LEFT BREAST, MASTECTOMY WITH AXILLARY CONTENTS: INVASIVE AND IN SITU DUCTAL CARCINOMA, TWO FOCI, 3.8 AND 2.2 CM. MARGINS NOT INVOLVED. METASTATIC CARCINOMA IN TWO OF EIGHT LYMPH NODES.   Location(s) of Symptomatic Metastases: Thoracic Spine  Past/Anticipated chemotherapy by medical oncology, if any: Completed 4 cycles of adjuvant AC chemotherapy June 15 through September 03, 2008, and then completed the left chest wall radiation. She began Arimidex following an office visit on November 11, 2008. She began Arimidex following an office visit on November 11, 2008  Pain on a scale of 0-10 is:  Lumbar spine, left hip and leg. Takes Hydrocodone but not daily for "waistband" back pain and left hip pain. Tylenol prn at times.  If Spine Met(s), symptoms, if any, include:  Bowel/Bladder retention or incontinence (please describe): no  Numbness or weakness in extremities (please describe): right knee/hip due to surgeries  Current Decadron regimen, if applicable: N/A  Ambulatory status? Walker? Wheelchair?: ambulatory  SAFETY ISSUES:  Prior radiation? 2010 left chest wall/left supraclavicular region- 45 Gy in 25 fractions, left chest wall/ mastectomy scar 59 Gy in 32 fractions, Dr Rolena Infante  Pacemaker/ICD? No  Possible current pregnancy? No  Is the patient on  methotrexate? No  Current Complaints / other details:  Married, no children. Retired from [employer].

## 2013-07-26 ENCOUNTER — Ambulatory Visit (HOSPITAL_COMMUNITY)
Admission: RE | Admit: 2013-07-26 | Discharge: 2013-07-26 | Disposition: A | Discharge status: Home / Self Care | Payer: 59 | Source: Ambulatory Visit | Attending: Oncology | Admitting: Oncology

## 2013-07-26 ENCOUNTER — Encounter (HOSPITAL_COMMUNITY): Payer: Self-pay

## 2013-07-26 DIAGNOSIS — Z96659 Presence of unspecified artificial knee joint: Secondary | ICD-10-CM | POA: Insufficient documentation

## 2013-07-26 DIAGNOSIS — C50912 Malignant neoplasm of unspecified site of left female breast: Secondary | ICD-10-CM

## 2013-07-26 DIAGNOSIS — M899 Disorder of bone, unspecified: Secondary | ICD-10-CM | POA: Insufficient documentation

## 2013-07-26 DIAGNOSIS — E785 Hyperlipidemia, unspecified: Secondary | ICD-10-CM | POA: Insufficient documentation

## 2013-07-26 DIAGNOSIS — Z9010 Acquired absence of unspecified breast and nipple: Secondary | ICD-10-CM | POA: Insufficient documentation

## 2013-07-26 DIAGNOSIS — C50919 Malignant neoplasm of unspecified site of unspecified female breast: Secondary | ICD-10-CM | POA: Insufficient documentation

## 2013-07-26 DIAGNOSIS — Z7982 Long term (current) use of aspirin: Secondary | ICD-10-CM | POA: Insufficient documentation

## 2013-07-26 DIAGNOSIS — Z79899 Other long term (current) drug therapy: Secondary | ICD-10-CM | POA: Insufficient documentation

## 2013-07-26 DIAGNOSIS — E119 Type 2 diabetes mellitus without complications: Secondary | ICD-10-CM | POA: Insufficient documentation

## 2013-07-26 DIAGNOSIS — M949 Disorder of cartilage, unspecified: Secondary | ICD-10-CM

## 2013-07-26 DIAGNOSIS — I10 Essential (primary) hypertension: Secondary | ICD-10-CM | POA: Insufficient documentation

## 2013-07-26 DIAGNOSIS — C7952 Secondary malignant neoplasm of bone marrow: Principal | ICD-10-CM

## 2013-07-26 DIAGNOSIS — C7951 Secondary malignant neoplasm of bone: Secondary | ICD-10-CM | POA: Insufficient documentation

## 2013-07-26 LAB — CBC
HEMATOCRIT: 37.8 % (ref 36.0–46.0)
Hemoglobin: 12.2 g/dL (ref 12.0–15.0)
MCH: 28.8 pg (ref 26.0–34.0)
MCHC: 32.3 g/dL (ref 30.0–36.0)
MCV: 89.2 fL (ref 78.0–100.0)
Platelets: 169 10*3/uL (ref 150–400)
RBC: 4.24 MIL/uL (ref 3.87–5.11)
RDW: 14.7 % (ref 11.5–15.5)
WBC: 4.9 10*3/uL (ref 4.0–10.5)

## 2013-07-26 LAB — APTT: APTT: 32 s (ref 24–37)

## 2013-07-26 LAB — PROTIME-INR
INR: 0.91 (ref 0.00–1.49)
Prothrombin Time: 12.3 seconds (ref 11.6–15.2)

## 2013-07-26 LAB — GLUCOSE, CAPILLARY: Glucose-Capillary: 108 mg/dL — ABNORMAL HIGH (ref 70–99)

## 2013-07-26 MED ORDER — MIDAZOLAM HCL 2 MG/2ML IJ SOLN
INTRAMUSCULAR | Status: AC
  Filled 2013-07-26: qty 4

## 2013-07-26 MED ORDER — MIDAZOLAM HCL 2 MG/2ML IJ SOLN
INTRAMUSCULAR | Status: AC | PRN
  Administered 2013-07-26: 2 mg via INTRAVENOUS
  Administered 2013-07-26: 1 mg via INTRAVENOUS

## 2013-07-26 MED ORDER — FENTANYL CITRATE 0.05 MG/ML IJ SOLN
INTRAMUSCULAR | Status: AC | PRN
Start: 2013-07-26 — End: 2013-07-26
  Administered 2013-07-26 (×2): 50 ug via INTRAVENOUS

## 2013-07-26 MED ORDER — FENTANYL CITRATE 0.05 MG/ML IJ SOLN
INTRAMUSCULAR | Status: AC
  Filled 2013-07-26: qty 4

## 2013-07-26 MED ORDER — SODIUM CHLORIDE 0.9 % IV SOLN
INTRAVENOUS | Status: DC
  Administered 2013-07-26: 08:00:00 via INTRAVENOUS

## 2013-07-26 NOTE — H&P (Signed)
[name] is an 63 y.o. female.   Chief Complaint: "I'm here for a CT biopsy" HPI: Patient with history of left breast carcinoma and recent PET scan revealing hypermetabolic mediastinal lymph nodes and widespread osseous disease presents today for CT guided left iliac bone lesion biopsy.  Past Medical History  Diagnosis Date  . Diabetes mellitus   . Hypertension   . Arthritis   . Hyperlipidemia   . Cancer     left breast  . Bone metastases     Past Surgical History  Procedure Laterality Date  . Colonoscopy  2007  . Bone density test  2008  . Replacement total knee  2008    RIGHT KNEE  . Total hip arthroplasty  2004    RIGHT HIP  . Mastectomy  2010    LEFT BREAST  . Portacath placement    . Port-a-cath removal    . Cholecystectomy    . Breast surgery      Family History  Problem Relation Age of Onset  . Diabetes Mother   . Hypertension Mother   . Heart disease Mother   . Diabetes Father   . Hypertension Father   . Heart disease Father   . Cancer Sister     breast   Social History:  reports that she has never smoked. She does not have any smokeless tobacco history on file. She reports that she does not drink alcohol or use illicit drugs.  Allergies:  Allergies  Allergen Reactions  . Doxycycline Hives    Current outpatient prescriptions:ALPRAZolam (XANAX) 0.5 MG tablet, Take 1 tablet (0.5 mg total) by mouth every 8 (eight) hours as needed for anxiety., Disp: 60 tablet, Rfl: 0;  Ascorbic Acid (VITAMIN C) 500 MG tablet, Take 500 mg by mouth daily.  , Disp: , Rfl: ;  aspirin (BABY ASPIRIN) 81 MG chewable tablet, Chew 81 mg by mouth daily.  , Disp: , Rfl: ;  buPROPion (WELLBUTRIN XL) 150 MG 24 hr tablet, Take 150 mg by mouth daily., Disp: , Rfl:  cholecalciferol (VITAMIN D) 1000 UNITS tablet, Take 1,000 Units by mouth daily., Disp: , Rfl: ;  diclofenac sodium (VOLTAREN) 1 % GEL, Apply 2 g topically as needed (for left hip, hands,shoulders,back). , Disp: , Rfl: ;   diphenhydrAMINE (BENADRYL) 25 mg capsule, Take 25 mg by mouth at bedtime as needed for sleep., Disp: , Rfl: ;  folic acid (FOLVITE) 1 MG tablet, Take 2 mg by mouth daily after breakfast. , Disp: , Rfl:  glipiZIDE (GLUCOTROL) 5 MG tablet, Take 5 mg by mouth daily before breakfast. , Disp: , Rfl: ;  HYDROcodone-acetaminophen (NORCO) 10-325 MG per tablet, Take 1 tablet by mouth every 4 (four) hours as needed., Disp: 60 tablet, Rfl: 0;  hydroxychloroquine (PLAQUENIL) 200 MG tablet, Take 200 mg by mouth at bedtime. , Disp: , Rfl: ;  leflunomide (ARAVA) 10 MG tablet, Take 10 mg by mouth daily., Disp: , Rfl:  metFORMIN (GLUMETZA) 500 MG (MOD) 24 hr tablet, Take 500 mg by mouth 2 (two) times daily with a meal. , Disp: , Rfl: ;  Multiple Vitamins-Minerals (CENTRUM SILVER PO), Take 1 tablet by mouth daily. , Disp: , Rfl: ;  quinapril (ACCUPRIL) 20 MG tablet, Take 20 mg by mouth at bedtime.  , Disp: , Rfl: ;  rosuvastatin (CRESTOR) 10 MG tablet, Take 10 mg by mouth at bedtime. , Disp: , Rfl:  Current facility-administered medications:0.9 %  sodium chloride infusion, , Intravenous, Continuous, Ascencion Dike, PA-C,  Last Rate: 20 mL/hr at 07/26/13 0814;  fentaNYL (SUBLIMAZE) 0.05 MG/ML injection, , , , ;  midazolam (VERSED) 2 MG/2ML injection, , , ,    Results for orders placed during the hospital encounter of 07/26/13 (from the past 48 hour(s))  APTT     Status: None   Collection Time    07/26/13  7:45 AM      Result Value Ref Range   aPTT 32  24 - 37 seconds  CBC     Status: None   Collection Time    07/26/13  7:45 AM      Result Value Ref Range   WBC 4.9  4.0 - 10.5 K/uL   RBC 4.24  3.87 - 5.11 MIL/uL   Hemoglobin 12.2  12.0 - 15.0 g/dL   HCT 37.8  36.0 - 46.0 %   MCV 89.2  78.0 - 100.0 fL   MCH 28.8  26.0 - 34.0 pg   MCHC 32.3  30.0 - 36.0 g/dL   RDW 14.7  11.5 - 15.5 %   Platelets 169  150 - 400 K/uL  PROTIME-INR     Status: None   Collection Time    07/26/13  7:45 AM      Result Value Ref Range    Prothrombin Time 12.3  11.6 - 15.2 seconds   INR 0.91  0.00 - 1.49   No results found.  Review of Systems  Constitutional: Negative for fever and chills.  Respiratory: Negative for hemoptysis and shortness of breath.        Occ cough  Cardiovascular: Negative for chest pain.       Has some occ tingling/numbnes at left breast mastectomy incision site  Gastrointestinal: Negative for nausea, vomiting, abdominal pain and blood in stool.  Genitourinary: Negative for dysuria and hematuria.  Musculoskeletal: Positive for back pain.       Has pain left hip/leg as well as some paresthesias rt hip /thigh  Neurological: Negative for headaches.  Psychiatric/Behavioral: The patient is nervous/anxious.     Blood pressure 142/79, pulse 97, temperature 97.3 F (36.3 C), resp. rate 18, SpO2 97.00%. Physical Exam   Assessment/Plan Patient with history of left breast carcinoma and recent PET scan revealing hypermetabolic mediastinal lymph nodes and widespread osseous disease presents today for CT guided left iliac bone lesion biopsy. Details/risks of procedure d/w pt with her understanding and consent.  ALLRED,D KEVIN 07/26/2013, 8:36 AM

## 2013-07-26 NOTE — H&P (Signed)
Agree.  For bone biopsy.  Most accessible lesion is left iliac lesion.

## 2013-07-26 NOTE — Discharge Instructions (Signed)
Conscious Sedation °Sedation is the use of medicines to promote relaxation and relieve discomfort and anxiety. Conscious sedation is a type of sedation. Under conscious sedation you are less alert than normal but are still able to respond to instructions or stimulation. Conscious sedation is used during short medical and dental procedures. It is milder than deep sedation or general anesthesia and allows you to return to your regular activities sooner.  °LET YOUR HEALTH CARE PROVIDER KNOW ABOUT:  °· Any allergies you have. °· All medicines you are taking, including vitamins, herbs, eye drops, creams, and over-the-counter medicines. °· Use of steroids (by mouth or creams). °· Previous problems you or members of your family have had with the use of anesthetics. °· Any blood disorders you have. °· Previous surgeries you have had. °· Medical conditions you have. °· Possibility of pregnancy, if this applies. °· Use of cigarettes, alcohol, or illegal drugs. °RISKS AND COMPLICATIONS °Generally, this is a safe procedure. However, as with any procedure, problems can occur. Possible problems include: °· Oversedation. °· Trouble breathing on your own. You may need to have a breathing tube until you are awake and breathing on your own. °· Allergic reaction to any of the medicines used for the procedure. °BEFORE THE PROCEDURE °· You may have blood tests done. These tests can help show how well your kidneys and liver are working. They can also show how well your blood clots. °· A physical exam will be done.   °· Only take medicines as directed by your health care provider. You may need to stop taking medicines (such as blood thinners, aspirin, or nonsteroidal anti-inflammatory drugs) before the procedure.   °· Do not eat or drink at least 6 hours before the procedure or as directed by your health care provider. °· Arrange for a responsible adult, family member, or friend to take you home after the procedure. He or she should stay  with you for at least 24 hours after the procedure, until the medicine has worn off. °PROCEDURE  °· An intravenous (IV) catheter will be inserted into one of your veins. Medicine will be able to flow directly into your body through this catheter. You may be given medicine through this tube to help prevent pain and help you relax. °· The medical or dental procedure will be done. °AFTER THE PROCEDURE °· You will stay in a recovery area until the medicine has worn off. Your blood pressure and pulse will be checked.   °·  Depending on the procedure you had, you may be allowed to go home when you can tolerate liquids and your pain is under control. °Document Released: 09/29/2000 Document Revised: 01/09/2013 Document Reviewed: 09/11/2012 °Exit[employer]® Patient Information ©2015 [employer]. This information is not intended to replace advice given to you by your health care provider. Make sure you discuss any questions you have with your health care provider. ° °Bone Marrow Aspiration, Bone Marrow Biopsy °Care After °Read the instructions outlined below and refer to this sheet in the next few weeks. These discharge instructions provide you with general information on caring for yourself after you leave the hospital. Your caregiver may also give you specific instructions. While your treatment has been planned according to the most current medical practices available, unavoidable complications occasionally occur. If you have any problems or questions after discharge, call your caregiver. °FINDING OUT THE RESULTS OF YOUR TEST °Not all test results are available during your visit. If your test results are not back during the visit, make an appointment with   your caregiver to find out the results. Do not assume everything is normal if you have not heard from your caregiver or the medical facility. It is important for you to follow up on all of your test results.  °HOME CARE INSTRUCTIONS  °You have had sedation and may be sleepy or  dizzy. Your thinking may not be as clear as usual. For the next 24 hours: °· Only take over-the-counter or prescription medicines for pain, discomfort, and or fever as directed by your caregiver. °· Do not drink alcohol. °· Do not smoke. °· Do not drive. °· Do not make important legal decisions. °· Do not operate heavy machinery. °· Do not care for small children by yourself. °· Keep your dressing clean and dry. You may replace dressing with a bandage after 24 hours. °· You may take a bath or shower after 24 hours. °· Use an ice pack for 20 minutes every 2 hours while awake for pain as needed. °SEEK MEDICAL CARE IF:  °· There is redness, swelling, or increasing pain at the biopsy site. °· There is pus coming from the biopsy site. °· There is drainage from a biopsy site lasting longer than one day. °· An unexplained oral temperature above 102° F (38.9° C) develops. °SEEK IMMEDIATE MEDICAL CARE IF:  °· You develop a rash. °· You have difficulty breathing. °· You develop any reaction or side effects to medications given. °Document Released: 07/24/2004 Document Revised: 03/29/2011 Document Reviewed: 01/02/2008 °Exit[employer]® Patient Information ©2015 [employer]. This information is not intended to replace advice given to you by your health care provider. Make sure you discuss any questions you have with your health care provider. ° °

## 2013-07-26 NOTE — Procedures (Signed)
Procedure:  CT guided biopsy of left iliac bone lesion Findings:  Left iliac bone lesion sampled with 11 G bone cutting needle.  Two core samples submitted in formalin.

## 2013-07-27 ENCOUNTER — Ambulatory Visit (HOSPITAL_BASED_OUTPATIENT_CLINIC_OR_DEPARTMENT_OTHER): Payer: 59 | Admitting: Nurse Practitioner

## 2013-07-27 ENCOUNTER — Ambulatory Visit
Admission: RE | Admit: 2013-07-27 | Discharge: 2013-07-27 | Disposition: A | Discharge status: Home / Self Care | Payer: 59 | Source: Ambulatory Visit | Attending: Radiation Oncology | Admitting: Radiation Oncology

## 2013-07-27 ENCOUNTER — Telehealth: Payer: Self-pay | Admitting: Oncology

## 2013-07-27 ENCOUNTER — Ambulatory Visit: Payer: 59

## 2013-07-27 ENCOUNTER — Encounter: Payer: Self-pay | Admitting: Radiation Oncology

## 2013-07-27 VITALS — BP 98/44 | HR 98 | Temp 98.6°F | Resp 20 | Ht 63.75 in | Wt 233.0 lb

## 2013-07-27 VITALS — BP 123/64 | HR 98 | Temp 98.4°F | Resp 18 | Ht 63.75 in | Wt 231.6 lb

## 2013-07-27 DIAGNOSIS — Z803 Family history of malignant neoplasm of breast: Secondary | ICD-10-CM

## 2013-07-27 DIAGNOSIS — C7951 Secondary malignant neoplasm of bone: Secondary | ICD-10-CM | POA: Insufficient documentation

## 2013-07-27 DIAGNOSIS — I10 Essential (primary) hypertension: Secondary | ICD-10-CM | POA: Diagnosis not present

## 2013-07-27 DIAGNOSIS — M545 Low back pain, unspecified: Secondary | ICD-10-CM

## 2013-07-27 DIAGNOSIS — Z9221 Personal history of antineoplastic chemotherapy: Secondary | ICD-10-CM | POA: Insufficient documentation

## 2013-07-27 DIAGNOSIS — Z923 Personal history of irradiation: Secondary | ICD-10-CM | POA: Diagnosis not present

## 2013-07-27 DIAGNOSIS — Z96649 Presence of unspecified artificial hip joint: Secondary | ICD-10-CM | POA: Insufficient documentation

## 2013-07-27 DIAGNOSIS — E119 Type 2 diabetes mellitus without complications: Secondary | ICD-10-CM

## 2013-07-27 DIAGNOSIS — E785 Hyperlipidemia, unspecified: Secondary | ICD-10-CM | POA: Insufficient documentation

## 2013-07-27 DIAGNOSIS — G893 Neoplasm related pain (acute) (chronic): Secondary | ICD-10-CM

## 2013-07-27 DIAGNOSIS — Z96659 Presence of unspecified artificial knee joint: Secondary | ICD-10-CM | POA: Insufficient documentation

## 2013-07-27 DIAGNOSIS — K117 Disturbances of salivary secretion: Secondary | ICD-10-CM | POA: Insufficient documentation

## 2013-07-27 DIAGNOSIS — C7952 Secondary malignant neoplasm of bone marrow: Principal | ICD-10-CM

## 2013-07-27 DIAGNOSIS — C50919 Malignant neoplasm of unspecified site of unspecified female breast: Secondary | ICD-10-CM

## 2013-07-27 DIAGNOSIS — Z510 Encounter for antineoplastic radiation therapy: Secondary | ICD-10-CM | POA: Diagnosis not present

## 2013-07-27 DIAGNOSIS — Z170 Estrogen receptor positive status [ER+]: Secondary | ICD-10-CM | POA: Insufficient documentation

## 2013-07-27 DIAGNOSIS — M069 Rheumatoid arthritis, unspecified: Secondary | ICD-10-CM

## 2013-07-27 DIAGNOSIS — Z9010 Acquired absence of unspecified breast and nipple: Secondary | ICD-10-CM | POA: Diagnosis not present

## 2013-07-27 DIAGNOSIS — Z79899 Other long term (current) drug therapy: Secondary | ICD-10-CM | POA: Insufficient documentation

## 2013-07-27 HISTORY — DX: Secondary malignant neoplasm of bone: C79.51

## 2013-07-27 HISTORY — DX: Personal history of irradiation: Z92.3

## 2013-07-27 HISTORY — DX: Malignant neoplasm of unspecified site of unspecified female breast: C50.919

## 2013-07-27 MED ORDER — DENOSUMAB 120 MG/1.7ML ~~LOC~~ SOLN
120.0000 mg | Freq: Once | SUBCUTANEOUS | Status: AC
  Administered 2013-07-27: 120 mg via SUBCUTANEOUS
  Filled 2013-07-27: qty 1.7

## 2013-07-27 NOTE — Progress Notes (Signed)
Please see the Nurse Progress Note in the MD Initial Consult Encounter for this patient. 

## 2013-07-27 NOTE — Progress Notes (Addendum)
[facility] OFFICE PROGRESS NOTE   Diagnosis:  Breast cancer.  INTERVAL HISTORY:   [name] returns as scheduled. She underwent a CT biopsy of a left iliac bone lesion on 07/26/2013. Pathology showed metastatic carcinoma. ER/PR and HER-2/neu pending.  She is having some pain at the biopsy site. She continues to have lower back pain which worsens with activity. She is taking hydrocodone one time a day with good relief. She denies nausea/vomiting. No mouth sores. No constipation. She denies shortness breath, cough and fever. No hematuria or dysuria.  She saw Dr. Isidore Moos earlier today and reports a course of radiation is planned to the left pelvic bones and left femur.  She reports she is currently taking a total of 1500 mg of calcium per day.  Objective:  Vital signs in last 24 hours:  Blood pressure 123/64, pulse 98, temperature 98.4 F (36.9 C), temperature source Oral, resp. rate 18, height 5' 3.75" (1.619 m), weight 231 lb 9.6 oz (105.053 kg).    HEENT: No thrush or ulcerations. Lymphatics: No palpable cervical or supraclavicular lymph nodes. Resp: Lungs clear. Cardio: Regular cardiac rhythm. GI: Abdomen soft and nontender. No hepatomegaly. Vascular: No leg edema. Calves soft and nontender.  Skin: Left iliac biopsy site is unremarkable.    Lab Results:  Lab Results  Component Value Date   WBC 4.9 07/26/2013   HGB 12.2 07/26/2013   HCT 37.8 07/26/2013   MCV 89.2 07/26/2013   PLT 169 07/26/2013   NEUTROABS 3.8 07/03/2013    Imaging:  Ct Biopsy  07/26/2013   CLINICAL DATA:  Metastatic breast carcinoma with multiple bone lesions. Request has been made to sample tissue from bone metastasis for confirmation of metastatic disease and prognostic tissue studies.  EXAM: CT GUIDED CORE BIOPSY OF LEFT ILIAC BONE LESION  ANESTHESIA/SEDATION: 3.0  Mg IV Versed; 100 mcg IV Fentanyl  Total Moderate Sedation Time: 15 minutes.  PROCEDURE: The procedure risks, benefits, and  alternatives were explained to the patient. Questions regarding the procedure were encouraged and answered. The patient understands and consents to the procedure.  The left gluteal region was prepped with Betadine in a sterile fashion, and a sterile drape was applied covering the operative field. A sterile gown and sterile gloves were used for the procedure. Local anesthesia was provided with 1% Lidocaine.  CT was performed in a prone position. A lesion was localized in the left iliac bone. Under CT guidance, an 11 gauge bone cutting needle was advanced to the lesion. Initial core biopsy sample was attempted via a coaxially placed 13 gauge needle. Ultimately, 2 separate 11 gauge core biopsy samples were obtained. These were submitted in formalin.  COMPLICATIONS: None  FINDINGS: After review of prior imaging, the left iliac bone lesion was chosen for sampling given its predominantly lytic appearance and safer access compared to other bone lesions. Solid tissue was obtained.  IMPRESSION: CT-guided core biopsy performed of left iliac bone lesion.   Electronically Signed   By: Aletta Edouard M.D.   On: 07/26/2013 13:22    Medications: I have reviewed the patient's current medications.  Assessment/Plan: 1. 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 ER positive, PR positive, HER-2 negative She completed 4 cycles of adjuvant AC chemotherapy June 15 through September 03, 2008, and then completed the left chest wall radiation. She began Arimidex following an office visit on November 11, 2008. Bone scan 06/01/2013 suggestive of thoracic spine metastases, thoracic MRI 06/27/2011 consistent with multiple bone metastases involving the thoracolumbar spine  PET scan 07/09/2013 with multiple hypermetabolic bone lesions and hypermetabolic mediastinal nodes. Left iliac lesion biopsy 07/26/2013.  Pathology showed metastatic carcinoma. 2. Left chest subcutaneous mass noted on exam October 04, 2008, status post biopsy by Dr. Brantley Stage with a benign pathology. 3. History of delayed healing of the left mastectomy incision. 4. Rheumatoid arthritis. 5. Diabetes. 6. Hypercholesterolemia. 7. Hypertension. 8. Family history of breast cancer. She has been evaluated at the genetic screening clinic. 9. Pain secondary to metastatic breast cancer involving the spine.   Disposition: [name] appears stable. Dr. Benay Spice reviewed the biopsy result with her which confirmed metastatic breast cancer. ER/PR and HER-2/neu pending. If the hormone receptor status is positive the plan is to begin Faslodex. She has seen Dr. Isidore Moos with plans to proceed with palliative radiation. Delton See will be initiated today on a 3 month schedule.  We will contact her once the ER/PR and HER-2/neu results are available. She will return for a followup visit in 6 weeks.   Patient seen with Dr. Benay Spice. 25 minutes were spent face-to-face at today's visit with the majority of that time involved in counseling/coordination of care.    [name] ANP/GNP-BC   07/27/2013  10:47 AM This was a shared visit with [name]. The bone biopsy confirms metastatic carcinoma. A breast prognostic profile is pending. The plan is to begin Faslodex the tumor is ER positive. She will begin xygeva today. We will coordinate the timing of Faslodex therapy with Dr. Karma Lew, M.D.

## 2013-07-27 NOTE — Patient Instructions (Signed)
Denosumab injection What is this medicine? DENOSUMAB (den oh sue mab) slows bone breakdown. Prolia is used to treat osteoporosis in women after menopause and in men. Xgeva is used to prevent bone fractures and other bone problems caused by cancer bone metastases. Xgeva is also used to treat giant cell tumor of the bone. This medicine may be used for other purposes; ask your health care provider or pharmacist if you have questions. COMMON BRAND NAME(S): Prolia, XGEVA What should I tell my health care provider before I take this medicine? They need to know if you have any of these conditions: -dental disease -eczema -infection or history of infections -kidney disease or on dialysis -low blood calcium or vitamin D -malabsorption syndrome -scheduled to have surgery or tooth extraction -taking medicine that contains denosumab -thyroid or parathyroid disease -an unusual reaction to denosumab, other medicines, foods, dyes, or preservatives -pregnant or trying to get pregnant -breast-feeding How should I use this medicine? This medicine is for injection under the skin. It is given by a health care professional in a hospital or clinic setting. If you are getting Prolia, a special MedGuide will be given to you by the pharmacist with each prescription and refill. Be sure to read this information carefully each time. For Prolia, talk to your pediatrician regarding the use of this medicine in children. Special care may be needed. For Xgeva, talk to your pediatrician regarding the use of this medicine in children. While this drug may be prescribed for children as young as 13 years for selected conditions, precautions do apply. Overdosage: If you think you've taken too much of this medicine contact a poison control center or emergency room at once. Overdosage: If you think you have taken too much of this medicine contact a poison control center or emergency room at once. NOTE: This medicine is only for  you. Do not share this medicine with others. What if I miss a dose? It is important not to miss your dose. Call your doctor or health care professional if you are unable to keep an appointment. What may interact with this medicine? Do not take this medicine with any of the following medications: -other medicines containing denosumab This medicine may also interact with the following medications: -medicines that suppress the immune system -medicines that treat cancer -steroid medicines like prednisone or cortisone This list may not describe all possible interactions. Give your health care provider a list of all the medicines, herbs, non-prescription drugs, or dietary supplements you use. Also tell them if you smoke, drink alcohol, or use illegal drugs. Some items may interact with your medicine. What should I watch for while using this medicine? Visit your doctor or health care professional for regular checks on your progress. Your doctor or health care professional may order blood tests and other tests to see how you are doing. Call your doctor or health care professional if you get a cold or other infection while receiving this medicine. Do not treat yourself. This medicine may decrease your body's ability to fight infection. You should make sure you get enough calcium and vitamin D while you are taking this medicine, unless your doctor tells you not to. Discuss the foods you eat and the vitamins you take with your health care professional. See your dentist regularly. Brush and floss your teeth as directed. Before you have any dental work done, tell your dentist you are receiving this medicine. Do not become pregnant while taking this medicine or for 5 months after stopping   it. Women should inform their doctor if they wish to become pregnant or think they might be pregnant. There is a potential for serious side effects to an unborn child. Talk to your health care professional or pharmacist for more  information. What side effects may I notice from receiving this medicine? Side effects that you should report to your doctor or health care professional as soon as possible: -allergic reactions like skin rash, itching or hives, swelling of the face, lips, or tongue -breathing problems -chest pain -fast, irregular heartbeat -feeling faint or lightheaded, falls -fever, chills, or any other sign of infection -muscle spasms, tightening, or twitches -numbness or tingling -skin blisters or bumps, or is dry, peels, or red -slow healing or unexplained pain in the mouth or jaw -unusual bleeding or bruising Side effects that usually do not require medical attention (Report these to your doctor or health care professional if they continue or are bothersome.): -muscle pain -stomach upset, gas This list may not describe all possible side effects. Call your doctor for medical advice about side effects. You may report side effects to FDA at 1-800-FDA-1088. Where should I keep my medicine? This medicine is only given in a clinic, doctor's office, or other health care setting and will not be stored at home. NOTE: This sheet is a summary. It may not cover all possible information. If you have questions about this medicine, talk to your doctor, pharmacist, or health care provider.  2015, Elsevier/Gold Standard. (2011-07-05 12:37:47)  

## 2013-07-27 NOTE — Progress Notes (Signed)
Radiation Oncology         [phone]) [phone] ________________________________  Initial outpatient Consultation  Name: GUILLERMINA SHAFT MRN: [id number]  Date: 07/27/2013  DOB: 04-12-1950  YF:VCBSWH,QPRFFMB, PA-C  Ladell Pier, MD   REFERRING PHYSICIAN: Ladell Pier, MD  DIAGNOSIS: Breast cancer with bone metastases  HISTORY OF PRESENT ILLNESS::Chiana V Hufford is a 63 y.o. female who was diagnosed W. 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 She underwent left mastectomy and axillary lymph node dissection in April 2010. Her disease is ER/PR positive and HER-2/neu negative. She received adjuvant chemotherapy followed by radiotherapy to her left chest wall and completed these treatments later in 2010. She then started Arimidex. A bone scan in May 2015 showed lesions consistent with thoracic spine metastases. She underwent a thoracic MRI on 06/26/2013 which was reviewed at our spine tumor board this week. There is multilevel disease, including T9, T10, T11, and L1. The most prominent lesion is at T10 with mild epidural tumor. There is no cord compression or fracture identified. MRI of her brain without contrast on 623 was negative for metastatic disease. PET scan on 07/09/2013, which I have also reviewed in detail with the patient, demonstrates lesions consistent with mediastinal disease as well as numerous bone metastases. The most concerning regions to my eye aren't C1 and C2, as well as the lower thoracic spine.  She has significant disease in the left ilium which was biopsied yesterday.  Symptomatically, she reports low back pain, which is central and on the left side, over the left iliac region. Sometimes the back pain goes up to her bra strap. She sometimes has pain in her left hip and left upper leg if she is standing for a long period. Also sometimes has left thigh pain when she lies down at night. She denies any neck pain. She has only had pain radiate down her leg in a  radicular fashion once or twice. She is ambulatory. She denies any bowel bladder or issues. She denies any new numbness or weakness. She has rheumatoid arthritis but does not take methotrexate.  PREVIOUS RADIATION THERAPY: Yes 2010 left chest wall/left supraclavicular region- 45 Gy in 25 fractions, left chest wall/ mastectomy scar 59 Gy in 32 fractions, Dr Rolena Infante   PAST MEDICAL HISTORY:  has a past medical history of Diabetes mellitus; Hypertension; Arthritis; Hyperlipidemia; Cancer; Bone metastases; Breast cancer; and History of radiation therapy (10/2008).    PAST SURGICAL HISTORY: Past Surgical History  Procedure Laterality Date  . Colonoscopy  2007  . Bone density test  2008  . Replacement total knee  2008    RIGHT KNEE  . Total hip arthroplasty  2004    RIGHT HIP  . Mastectomy  2010    LEFT BREAST  . Portacath placement    . Port-a-cath removal    . Cholecystectomy    . Breast surgery      FAMILY HISTORY: family history includes Cancer in her sister; Diabetes in her father and mother; Heart disease in her father and mother; Hypertension in her father and mother.  SOCIAL HISTORY:  reports that she has never smoked. She does not have any smokeless tobacco history on file. She reports that she does not drink alcohol or use illicit drugs.  ALLERGIES: Doxycycline  MEDICATIONS:  Current Outpatient Prescriptions  Medication Sig Dispense Refill  . ALPRAZolam (XANAX) 0.5 MG tablet Take 1 tablet (0.5 mg total) by mouth every 8 (eight) hours as needed for anxiety.  Coqui  tablet  0  . Ascorbic Acid (VITAMIN C) 500 MG tablet Take 500 mg by mouth daily.        Marland Kitchen buPROPion (WELLBUTRIN XL) 150 MG 24 hr tablet Take 150 mg by mouth daily.      . cholecalciferol (VITAMIN D) 1000 UNITS tablet Take 1,000 Units by mouth daily.      . diclofenac sodium (VOLTAREN) 1 % GEL Apply 2 g topically as needed (for left hip, hands,shoulders,back).       . diphenhydrAMINE (BENADRYL) 25 mg capsule Take 25 mg by  mouth at bedtime as needed for sleep.      . folic acid (FOLVITE) 1 MG tablet Take 2 mg by mouth daily after breakfast.       . glipiZIDE (GLUCOTROL) 5 MG tablet Take 5 mg by mouth daily before breakfast.       . HYDROcodone-acetaminophen (NORCO) 10-325 MG per tablet Take 1 tablet by mouth every 4 (four) hours as needed.  60 tablet  0  . hydroxychloroquine (PLAQUENIL) 200 MG tablet Take 200 mg by mouth at bedtime.       Marland Kitchen leflunomide (ARAVA) 10 MG tablet Take 10 mg by mouth daily.      . metFORMIN (GLUMETZA) 500 MG (MOD) 24 hr tablet Take 500 mg by mouth 2 (two) times daily with a meal.       . Multiple Vitamins-Minerals (CENTRUM SILVER PO) Take 1 tablet by mouth daily.       . quinapril (ACCUPRIL) 20 MG tablet Take 20 mg by mouth at bedtime.        . rosuvastatin (CRESTOR) 10 MG tablet Take 10 mg by mouth at bedtime.        No current facility-administered medications for this encounter.   Facility-Administered Medications Ordered in Other Encounters  Medication Dose Route Frequency Provider Last Rate Last Dose  . denosumab (XGEVA) injection 120 mg  120 mg Subcutaneous Once Owens Shark, NP        REVIEW OF SYSTEMS:  Notable for that above.   PHYSICAL EXAM:  height is 5' 3.75" (1.619 m) and weight is 233 lb (105.688 kg). Her oral temperature is 98.6 F (37 C). Her blood pressure is 98/44 and her pulse is 98. Her respiration is 20.   General: Alert and oriented, in no acute distress HEENT: Head is normocephalic. Extraocular movements are intact. Oropharynx is clear. Neck: Neck is supple, no palpable cervical or supraclavicular lymphadenopathy. Heart: Regular in rate and rhythm Chest: Clear to auscultation bilaterally, with no rhonchi, wheezes, or rales. Abdomen: Soft, nontender, nondistended, with no rigidity or guarding. Extremities: No cyanosis or edema. Skin: No concerning lesions. Musculoskeletal: Ambulatory without cane. No reproducible tenderness. Although, I did not palpate the  left ilium where she had recent biopsy. Pain is localized in the left lower back/ilium Neurologic: Cranial nerves II through XII are grossly intact. No obvious focalities. Speech is fluent. Coordination is intact. Psychiatric: Judgment and insight are intact. Affect is appropriate.    ECOG = 1  0 - Asymptomatic (Fully active, able to carry on all predisease activities without restriction)  1 - Symptomatic but completely ambulatory (Restricted in physically strenuous activity but ambulatory and able to carry out work of a light or sedentary nature. For example, light housework, office work)  2 - Symptomatic, <50% in bed during the day (Ambulatory and capable of all self care but unable to carry out any work activities. Up and about more than 50% of waking hours)  3 - Symptomatic, >50% in bed, but not bedbound (Capable of only limited self-care, confined to bed or chair 50% or more of waking hours)  4 - Bedbound (Completely disabled. Cannot carry on any self-care. Totally confined to bed or chair)  5 - Death   Eustace Pen MM, Creech RH, Tormey DC, et al. [phone]). "Toxicity and response criteria of the [facility]". [place] Oncol. 5 (6): 649-55   LABORATORY DATA:  Lab Results  Component Value Date   WBC 4.9 07/26/2013   HGB 12.2 07/26/2013   HCT 37.8 07/26/2013   MCV 89.2 07/26/2013   PLT 169 07/26/2013   CMP     Component Value Date/Time   NA 139 07/03/2013 1233   NA 138 08/13/2008 1026   K 4.9 07/03/2013 1233   K 4.1 08/13/2008 1026   CL 107 08/13/2008 1026   CO2 18* 07/03/2013 1233   CO2 24 08/13/2008 1026   GLUCOSE 115 07/03/2013 1233   GLUCOSE 92 08/13/2008 1026   BUN 26.3* 07/03/2013 1233   BUN 20 08/13/2008 1026   CREATININE 1.7* 07/03/2013 1233   CREATININE 1.13 08/13/2008 1026   CALCIUM 9.9 07/03/2013 1233   CALCIUM 8.9 08/13/2008 1026   PROT 7.2 07/03/2013 1233   PROT 6.6 08/13/2008 1026   ALBUMIN 3.6 07/03/2013 1233   ALBUMIN 3.9 08/13/2008 1026   AST 20 07/03/2013  1233   AST 31 08/13/2008 1026   ALT 15 07/03/2013 1233   ALT 32 08/13/2008 1026   ALKPHOS 179* 07/03/2013 1233   ALKPHOS 106 08/13/2008 1026   BILITOT 0.25 07/03/2013 1233   BILITOT 0.5 08/13/2008 1026   GFRNONAA 55* 07/01/2008 0800   GFRAA  Value: >60        The eGFR has been calculated using the MDRD equation. This calculation has not been validated in all clinical situations. eGFR's persistently <60 mL/min signify possible Chronic Kidney Disease. 07/01/2008 0800         RADIOGRAPHY: Mr Brain Wo Contrast  07/10/2013   CLINICAL DATA:  Evaluate for metastatic disease. Renal function precludes gadolinium administration.  EXAM: MRI HEAD WITHOUT CONTRAST  TECHNIQUE: Multiplanar, multiecho pulse sequences of the brain and surrounding structures were obtained without intravenous contrast.  COMPARISON:  MRI of the spine 06/26/2013.  PET scan 07/09/2013.  FINDINGS: Sensitivity in the detection of intracranial metastatic disease is diminished in the absence of IV gadolinium.  Premature for age cerebral and cerebellar atrophy. Moderately extensive T2 and FLAIR hyperintensities suggesting chronic microvascular ischemic change.  No evidence for acute infarction, hemorrhage, mass lesion, hydrocephalus, or extra-axial fluid. Flow voids are maintained in the carotid, basilar, and both vertebral arteries. Normal pituitary, with no tonsillar herniation.  Extensive metastatic disease involves the C2 vertebral body primarily on the left, extending into the odontoid. Involvement of the lateral mass of C1 on the right is suspected. Posterior element involvement at C2 is also suspected. Within limits of evaluation on noncontrast MR, no visible epidural tumor or cord compression. No skull base involvement, but heterogeneity of the diploic space over the calvarium could reflect diffuse metastatic disease.  Negative orbits. Left nasal turbinate hypertrophy. Mild chronic sinus disease. No mastoid fluid.  IMPRESSION: No intracranial  intra-axial metastatic disease is identified. Atrophy and small vessel disease.  Extensive osseous metastatic disease involving the left C2 vertebral body and odontoid, and lateral mass of C1. No epidural tumor at this location although difficult to predict the likelihood of pathologic fracture at this location. Additional imaging  could be performed with dedicated MRI cervical spine or CT of the cervical spine.   Electronically Signed   By: Rolla Flatten M.D.   On: 07/10/2013 11:00   Nm Pet Image Initial (pi) Skull Base To Thigh  07/09/2013   CLINICAL DATA:  The initial treatment strategy for breast cancer.  EXAM: NUCLEAR MEDICINE PET SKULL BASE TO THIGH  TECHNIQUE: 13.6 mCi F-18 FDG was injected intravenously. Full-ring PET imaging was performed from the skull base to thigh after the radiotracer. CT data was obtained and used for attenuation correction and anatomic localization.  FASTING BLOOD GLUCOSE:  Value: 105 mg/dl  COMPARISON:  MR thoracic spine 06/26/2013 and bone scan 06/01/2013.  FINDINGS: NECK  No hypermetabolic lymph nodes in the neck. CT images show no acute findings.  CHEST  Hypermetabolic lymph nodes are seen in mediastinum and along the course of the descending thoracic aorta. Index high left paratracheal lymph node measures 7 mm (CT image 55) with an SUV max of 10.7. No hypermetabolic hilar or axillary lymph nodes. Postoperative changes of left mastectomy are seen with mild associated postoperative hypermetabolism. No hypermetabolic pulmonary nodules.  CT images show a 1.3 cm low-attenuation lesion in the right lobe of the thyroid. No pericardial or pleural effusion. Given normal respiratory motion, lungs are grossly clear. There is mild subpleural radiation fibrosis in the anterior left lung. Airway is unremarkable.  ABDOMEN/PELVIS  No abnormal hypermetabolism in the liver, adrenal glands, spleen or pancreas. No hypermetabolic lymph nodes.  CT images show the liver to be grossly unremarkable.  Cholecystectomy. Adrenal glands, kidneys, spleen, pancreas, stomach and bowel are grossly unremarkable. Uterus and ovaries are visualized. No free fluid. Scattered atherosclerotic calcification of the arterial vasculature without abdominal aortic aneurysm.  SKELETON  There are numerous hypermetabolic osseous metastatic lesions involving the axial and appendicular skeleton. Index lesion involving the T10 vertebral body has an SUV max of 20.0. Extensive epidural involvement was better seen on 06/26/2013.  IMPRESSION: 1. Hypermetabolic mediastinal lymph nodes, most consistent with metastatic disease. 2. Widespread osseous metastatic disease. T10 epidural involvement was better seen and described on 06/26/2013.   Electronically Signed   By: Lorin Picket M.D.   On: 07/09/2013 14:38   Ct Biopsy  07/26/2013   CLINICAL DATA:  Metastatic breast carcinoma with multiple bone lesions. Request has been made to sample tissue from bone metastasis for confirmation of metastatic disease and prognostic tissue studies.  EXAM: CT GUIDED CORE BIOPSY OF LEFT ILIAC BONE LESION  ANESTHESIA/SEDATION: 3.0  Mg IV Versed; 100 mcg IV Fentanyl  Total Moderate Sedation Time: 15 minutes.  PROCEDURE: The procedure risks, benefits, and alternatives were explained to the patient. Questions regarding the procedure were encouraged and answered. The patient understands and consents to the procedure.  The left gluteal region was prepped with Betadine in a sterile fashion, and a sterile drape was applied covering the operative field. A sterile gown and sterile gloves were used for the procedure. Local anesthesia was provided with 1% Lidocaine.  CT was performed in a prone position. A lesion was localized in the left iliac bone. Under CT guidance, an 11 gauge bone cutting needle was advanced to the lesion. Initial core biopsy sample was attempted via a coaxially placed 13 gauge needle. Ultimately, 2 separate 11 gauge core biopsy samples were obtained.  These were submitted in formalin.  COMPLICATIONS: None  FINDINGS: After review of prior imaging, the left iliac bone lesion was chosen for sampling given its predominantly lytic appearance and safer  access compared to other bone lesions. Solid tissue was obtained.  IMPRESSION: CT-guided core biopsy performed of left iliac bone lesion.   Electronically Signed   By: Aletta Edouard M.D.   On: 07/26/2013 13:22      IMPRESSION/PLAN: This Is a lovely 63 year old woman with recurrent breast cancer, now demonstrate mediastinal and bone metastases on recent PET scan. As discussed above, she was reviewed at our [facility] spine tumor board. After looking at her imaging, and examining and speaking with the patient, I feel that she is most likely symptomatic from the left ilium disease, and probably the lower thoracic disease. I'm not convinced that the lumbar metastases are causing symptoms. 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   We talked about the risks of radiating large amounts of bone marrow and large volumes all at once. Prioritizing the areas that are most likely to benefit from radiation, I recommended that we treat the upper cervical spine, lower thoracic spine, left ilium, and the left hip and proximal femur. I will probably treat all sites to 30 gray in 10 fractions except the left hip and proximal femur would receive 8 gray in 1 fraction. The cervical spine is not symptomatic but this area could cause problems in the future if she doesn't receive treatment now. She is enthusiastic about this plan. We discussed the risks benefits and side effects of treatment. This may include but not necessarily be limited to fatigue, skin irritation, sore throat, GI upset. A consent form has been signed and placed in her chart. Simulation will take place this Monday, July 13.     ________________________   Eppie Gibson, MD

## 2013-07-27 NOTE — Telephone Encounter (Signed)
gv pt appt schedule for aug thru oct. pt aware they BS will address sept f/u when he sees her 8/24.

## 2013-07-30 ENCOUNTER — Ambulatory Visit
Admission: RE | Admit: 2013-07-30 | Discharge: 2013-07-30 | Disposition: A | Discharge status: Home / Self Care | Payer: 59 | Source: Ambulatory Visit | Attending: Radiation Oncology | Admitting: Radiation Oncology

## 2013-07-30 DIAGNOSIS — Z510 Encounter for antineoplastic radiation therapy: Secondary | ICD-10-CM | POA: Diagnosis not present

## 2013-07-30 DIAGNOSIS — C7952 Secondary malignant neoplasm of bone marrow: Principal | ICD-10-CM

## 2013-07-30 DIAGNOSIS — C7951 Secondary malignant neoplasm of bone: Secondary | ICD-10-CM

## 2013-07-30 NOTE — Progress Notes (Signed)
  Radiation Oncology         [phone] ________________________________  Name: TERRIA DESCHEPPER MRN: [id number]  Date: 07/30/2013  DOB: 12/09/1950  SIMULATION AND TREATMENT PLANNING NOTE  Outpatient  DIAGNOSIS:  Bone metastases from breast cancer  NARRATIVE:  The patient was brought to the [facility].  Identity was confirmed.  All relevant records and images related to the planned course of therapy were reviewed.  The patient freely provided informed written consent to proceed with treatment after reviewing the details related to the planned course of therapy. The consent form was witnessed and verified by the simulation staff.    Then, the patient was set-up in a stable reproducible  supine position for radiation therapy.  Aquaplast mask for head and legs vaclock were fabricated. CT images were obtained.  Surface markings were placed.  The CT images were loaded into the planning software.    TREATMENT PLANNING NOTE: Treatment planning then occurred.  The radiation prescription was entered and confirmed.    A total of 11 medically necessary complex treatment devices were fabricated and supervised by me: aquaplast, vaclock, and 9 fields with MLCs to block  parotids, cord, esophagus/pharynx, lungs, heart. I have requested : 3D Simulation and dose calc's.  I have requested a DVH of the following structures: parotids, cord, esophagus/pharynx, lungs, heart, target volumes.   The patient will receive 30 Gy in 10 fractions to C1-3 with 3 fields (opposed laterals and PA), T8-T12 with 2 fields (AP/PA), and L ilium lesion (AP/PA).  8 Gy in 1 fraction to L proximal femur and L hip (AP/PA).   -----------------------------------  Eppie Gibson, MD

## 2013-08-01 DIAGNOSIS — Z510 Encounter for antineoplastic radiation therapy: Secondary | ICD-10-CM | POA: Diagnosis not present

## 2013-08-03 ENCOUNTER — Other Ambulatory Visit: Payer: Self-pay | Admitting: Nurse Practitioner

## 2013-08-03 ENCOUNTER — Ambulatory Visit
Admission: RE | Admit: 2013-08-03 | Discharge: 2013-08-03 | Disposition: A | Discharge status: Home / Self Care | Payer: 59 | Source: Ambulatory Visit | Attending: Radiation Oncology | Admitting: Radiation Oncology

## 2013-08-03 DIAGNOSIS — C7951 Secondary malignant neoplasm of bone: Secondary | ICD-10-CM

## 2013-08-03 DIAGNOSIS — C50912 Malignant neoplasm of unspecified site of left female breast: Secondary | ICD-10-CM

## 2013-08-03 DIAGNOSIS — C7952 Secondary malignant neoplasm of bone marrow: Secondary | ICD-10-CM

## 2013-08-03 NOTE — Progress Notes (Signed)
  Radiation Oncology         [phone] ________________________________  Name: [name] MRN: [id number]  Date: 08/03/2013  DOB: [birth year]  Simulation Verification Note  Status: outpatient  NARRATIVE: The patient was brought to the treatment unit and placed in the planned treatment position. The clinical setup was verified. Then port films were obtained and uploaded to the radiation oncology medical record software.  The treatment beams were carefully compared against the planned radiation fields. The position location and shape of the radiation fields was reviewed. They targeted volume of tissue appears to be appropriately covered by the radiation beams. Organs at risk appear to be excluded as planned.  Based on my personal review, I approved the simulation verification. The patient's treatment will proceed as planned.  ------------------------------------------------  Sheral Apley Tammi Klippel, M.D.

## 2013-08-05 ENCOUNTER — Telehealth: Payer: Self-pay | Admitting: Oncology

## 2013-08-05 NOTE — Telephone Encounter (Signed)
s.w. pt husband and advised on appts...ok and aware

## 2013-08-06 ENCOUNTER — Ambulatory Visit
Admission: RE | Admit: 2013-08-06 | Discharge: 2013-08-06 | Disposition: A | Discharge status: Home / Self Care | Payer: 59 | Source: Ambulatory Visit | Attending: Radiation Oncology | Admitting: Radiation Oncology

## 2013-08-06 VITALS — BP 142/69 | HR 88 | Temp 97.5°F | Resp 20 | Wt 230.0 lb

## 2013-08-06 DIAGNOSIS — C7952 Secondary malignant neoplasm of bone marrow: Principal | ICD-10-CM

## 2013-08-06 DIAGNOSIS — C7951 Secondary malignant neoplasm of bone: Secondary | ICD-10-CM

## 2013-08-06 DIAGNOSIS — Z510 Encounter for antineoplastic radiation therapy: Secondary | ICD-10-CM | POA: Diagnosis not present

## 2013-08-06 NOTE — Addendum Note (Signed)
Addended by: Betsy Coder B on: 08/06/2013 08:47 AM   Modules accepted: Orders

## 2013-08-06 NOTE — Progress Notes (Signed)
Patient denies pain but states her back "hurts" when she goes form sitting to standing or is on her feet for extended periods. She denies fatigue, loss of appetite.

## 2013-08-06 NOTE — Progress Notes (Signed)
   Weekly Management Note:  outpatient  Bone metastases  Current Dose:  8 of 8 Gy  To left hip/femur, and 3 of 30 Gy to T spine C spine and L ilium    Narrative:  The patient presents for routine under treatment assessment.  CBCT/MVCT images/Port film x-rays were reviewed.  The chart was checked. Dry mouth.  No fatigue. Appetite good. Weight stable. Drinking fluids.  Physical Findings:  weight is 230 lb (104.327 kg). Her temperature is 97.5 F (36.4 C). Her blood pressure is 142/69 and her pulse is 88. Her respiration is 20.  NAD, oral mucosa slightly dry, no thrush  Impression:  The patient is tolerating radiotherapy.  Plan:  Continue radiotherapy as planned.    ________________________________   Eppie Gibson, M.D.

## 2013-08-07 ENCOUNTER — Ambulatory Visit
Admission: RE | Admit: 2013-08-07 | Discharge: 2013-08-07 | Disposition: A | Discharge status: Home / Self Care | Payer: 59 | Source: Ambulatory Visit | Attending: Radiation Oncology | Admitting: Radiation Oncology

## 2013-08-07 DIAGNOSIS — Z510 Encounter for antineoplastic radiation therapy: Secondary | ICD-10-CM | POA: Diagnosis not present

## 2013-08-08 ENCOUNTER — Ambulatory Visit
Admission: RE | Admit: 2013-08-08 | Discharge: 2013-08-08 | Disposition: A | Discharge status: Home / Self Care | Payer: 59 | Source: Ambulatory Visit | Attending: Radiation Oncology | Admitting: Radiation Oncology

## 2013-08-08 DIAGNOSIS — Z510 Encounter for antineoplastic radiation therapy: Secondary | ICD-10-CM | POA: Diagnosis not present

## 2013-08-09 ENCOUNTER — Ambulatory Visit
Admission: RE | Admit: 2013-08-09 | Discharge: 2013-08-09 | Disposition: A | Discharge status: Home / Self Care | Payer: 59 | Source: Ambulatory Visit | Attending: Radiation Oncology | Admitting: Radiation Oncology

## 2013-08-09 DIAGNOSIS — Z510 Encounter for antineoplastic radiation therapy: Secondary | ICD-10-CM | POA: Diagnosis not present

## 2013-08-10 ENCOUNTER — Telehealth: Payer: Self-pay | Admitting: Oncology

## 2013-08-10 ENCOUNTER — Other Ambulatory Visit: Payer: Self-pay | Admitting: Physician Assistant

## 2013-08-10 ENCOUNTER — Ambulatory Visit: Payer: 59

## 2013-08-10 ENCOUNTER — Ambulatory Visit
Admission: RE | Admit: 2013-08-10 | Discharge: 2013-08-10 | Disposition: A | Discharge status: Home / Self Care | Payer: 59 | Source: Ambulatory Visit | Attending: Radiation Oncology | Admitting: Radiation Oncology

## 2013-08-10 ENCOUNTER — Ambulatory Visit (HOSPITAL_BASED_OUTPATIENT_CLINIC_OR_DEPARTMENT_OTHER): Payer: 59

## 2013-08-10 VITALS — BP 124/70 | HR 91 | Temp 98.1°F

## 2013-08-10 DIAGNOSIS — C7952 Secondary malignant neoplasm of bone marrow: Principal | ICD-10-CM

## 2013-08-10 DIAGNOSIS — Z853 Personal history of malignant neoplasm of breast: Secondary | ICD-10-CM

## 2013-08-10 DIAGNOSIS — C7951 Secondary malignant neoplasm of bone: Secondary | ICD-10-CM

## 2013-08-10 DIAGNOSIS — C50919 Malignant neoplasm of unspecified site of unspecified female breast: Secondary | ICD-10-CM

## 2013-08-10 DIAGNOSIS — Z5111 Encounter for antineoplastic chemotherapy: Secondary | ICD-10-CM

## 2013-08-10 DIAGNOSIS — Z510 Encounter for antineoplastic radiation therapy: Secondary | ICD-10-CM | POA: Diagnosis not present

## 2013-08-10 MED ORDER — FULVESTRANT 250 MG/5ML IM SOLN
500.0000 mg | INTRAMUSCULAR | Status: DC
  Administered 2013-08-10: 500 mg via INTRAMUSCULAR
  Filled 2013-08-10: qty 10

## 2013-08-10 NOTE — Patient Instructions (Signed)
Fulvestrant injection  What is this medicine?  FULVESTRANT (ful VES trant) blocks the effects of estrogen. It is used to treat breast cancer in women past the age of menopause.  This medicine may be used for other purposes; ask your health care provider or pharmacist if you have questions.  COMMON BRAND NAME(S): FASLODEX  What should I tell my health care provider before I take this medicine?  They need to know if you have any of these conditions:  -bleeding problems  -liver disease  -low levels of platelets in the blood  -an unusual or allergic reaction to fulvestrant, other medicines, foods, dyes, or preservatives  -pregnant or trying to get pregnant  -breast-feeding  How should I use this medicine?  This medicine is for injection into a muscle. It is usually given by a health care professional in a hospital or clinic setting.  Talk to your pediatrician regarding the use of this medicine in children. Special care may be needed.  Overdosage: If you think you have taken too much of this medicine contact a poison control center or emergency room at once.  NOTE: This medicine is only for you. Do not share this medicine with others.  What if I miss a dose?  It is important not to miss your dose. Call your doctor or health care professional if you are unable to keep an appointment.  What may interact with this medicine?  -medicines that treat or prevent blood clots like warfarin, enoxaparin, and dalteparin  This list may not describe all possible interactions. Give your health care provider a list of all the medicines, herbs, non-prescription drugs, or dietary supplements you use. Also tell them if you smoke, drink alcohol, or use illegal drugs. Some items may interact with your medicine.  What should I watch for while using this medicine?  Your condition will be monitored carefully while you are receiving this medicine. You will need important blood work done while you are taking this medicine.  Do not become pregnant  while taking this medicine. Women should inform their doctor if they wish to become pregnant or think they might be pregnant. There is a potential for serious side effects to an unborn child. Talk to your health care professional or pharmacist for more information.  What side effects may I notice from receiving this medicine?  Side effects that you should report to your doctor or health care professional as soon as possible:  -allergic reactions like skin rash, itching or hives, swelling of the face, lips, or tongue  -feeling faint or lightheaded, falls  -fever or flu-like symptoms  -sore throat  -vaginal bleeding  Side effects that usually do not require medical attention (report to your doctor or health care professional if they continue or are bothersome):  -aches, pains  -constipation or diarrhea  -headache  -hot flashes  -nausea, vomiting  -pain at site where injected  -stomach pain  This list may not describe all possible side effects. Call your doctor for medical advice about side effects. You may report side effects to FDA at 1-800-FDA-1088.  Where should I keep my medicine?  This drug is given in a hospital or clinic and will not be stored at home.  NOTE: This sheet is a summary. It may not cover all possible information. If you have questions about this medicine, talk to your doctor, pharmacist, or health care provider.   2015, Elsevier/Gold Standard. (2007-05-15 15:39:24)

## 2013-08-10 NOTE — Telephone Encounter (Signed)
gv and printed appt sched and avs for pt for July thru Dec

## 2013-08-13 ENCOUNTER — Encounter: Payer: Self-pay | Admitting: Radiation Oncology

## 2013-08-13 ENCOUNTER — Ambulatory Visit
Admission: RE | Admit: 2013-08-13 | Discharge: 2013-08-13 | Disposition: A | Discharge status: Home / Self Care | Payer: 59 | Source: Ambulatory Visit | Attending: Radiation Oncology | Admitting: Radiation Oncology

## 2013-08-13 VITALS — BP 118/72 | HR 87 | Temp 98.1°F | Ht 63.0 in | Wt 223.2 lb

## 2013-08-13 DIAGNOSIS — C7951 Secondary malignant neoplasm of bone: Secondary | ICD-10-CM

## 2013-08-13 DIAGNOSIS — Z510 Encounter for antineoplastic radiation therapy: Secondary | ICD-10-CM | POA: Diagnosis not present

## 2013-08-13 DIAGNOSIS — C7952 Secondary malignant neoplasm of bone marrow: Principal | ICD-10-CM

## 2013-08-13 NOTE — Progress Notes (Addendum)
[name] has received 6 fractions to her Lt. Ilium, C1-C# spine, and T8-T12.  She denies any pain while sitting, but reports that ambulation increases pain in her back.  She is taking Fasoldex and notes that since she started she notes a smell and a taste in her mouth " like a skunk" and notes that her urine smells the same.  Called the [facility] and they research that patients have stated that they have noted "really bad" body odors with Fasoldex which remain for approx. 1 week after starting.

## 2013-08-13 NOTE — Progress Notes (Signed)
Weekly Management Note:  Site: Left ilium/cervical spine/thoracic spine Current Dose:  1800  cGy Projected Dose: 3  cGy  Narrative: The patient is seen today for routine under treatment assessment. CBCT/MVCT images/port films were reviewed. The chart was reviewed.   She is without new complaints today, although she does have an altered sense of smell since starting Faslodex last Friday. The pharmacy confirms that smell can be altered on initiation of Faslodex therapy.  Physical Examination:  Filed Vitals:   08/13/13 1308  BP: 118/72  Pulse: 87  Temp: 98.1 F (36.7 C)  .  Weight: 223 lb 3.2 oz (101.243 kg). No change.  Impression: Tolerating radiation therapy well. She will finish radiation therapy this Friday  Plan: Continue radiation therapy as planned. One-month followup with Dr. Isidore Moos after completion of radiation therapy.

## 2013-08-14 ENCOUNTER — Ambulatory Visit
Admission: RE | Admit: 2013-08-14 | Discharge: 2013-08-14 | Disposition: A | Discharge status: Home / Self Care | Payer: 59 | Source: Ambulatory Visit | Attending: Radiation Oncology | Admitting: Radiation Oncology

## 2013-08-14 DIAGNOSIS — Z510 Encounter for antineoplastic radiation therapy: Secondary | ICD-10-CM | POA: Diagnosis not present

## 2013-08-15 ENCOUNTER — Encounter: Payer: Self-pay | Admitting: Physician Assistant

## 2013-08-15 ENCOUNTER — Ambulatory Visit
Admission: RE | Admit: 2013-08-15 | Discharge: 2013-08-15 | Disposition: A | Discharge status: Home / Self Care | Payer: 59 | Source: Ambulatory Visit | Attending: Radiation Oncology | Admitting: Radiation Oncology

## 2013-08-15 DIAGNOSIS — Z510 Encounter for antineoplastic radiation therapy: Secondary | ICD-10-CM | POA: Diagnosis not present

## 2013-08-15 NOTE — Progress Notes (Signed)
Late entry: Was approached on 08/10/2013 the injection nurse as well as [address] [facility] pharmacist regarding the initiation of Faslodex injections on [name]. Per Dr. Gearldine Shown note pending her pathology report Faslodex would be indicated. Her pathology report session number [id number] revealed estrogen receptor 100% progesterone receptor 90% proliferation marker Ki-67 was 27% HER-2/neu was negative. There review this information along with Dr. Gearldine Shown note with Dr. Jana Hakim agreed with initiation of Faslodex therapy. He recommended the patient be given an injection today followed by another injection in 2 weeks and in 4 weeks followed by  an injection every 28 days. He questioned whether Ibrandsshould also be added. This be discussed with Dr. Benay Spice when he returns to the office.  Wynetta Emery, Emarie Paul E, PA-C

## 2013-08-16 ENCOUNTER — Ambulatory Visit
Admission: RE | Admit: 2013-08-16 | Discharge: 2013-08-16 | Disposition: A | Discharge status: Home / Self Care | Payer: 59 | Source: Ambulatory Visit | Attending: Radiation Oncology | Admitting: Radiation Oncology

## 2013-08-16 DIAGNOSIS — Z510 Encounter for antineoplastic radiation therapy: Secondary | ICD-10-CM | POA: Diagnosis not present

## 2013-08-17 ENCOUNTER — Ambulatory Visit
Admission: RE | Admit: 2013-08-17 | Discharge: 2013-08-17 | Disposition: A | Discharge status: Home / Self Care | Payer: 59 | Source: Ambulatory Visit | Attending: Radiation Oncology | Admitting: Radiation Oncology

## 2013-08-17 ENCOUNTER — Encounter: Payer: Self-pay | Admitting: Radiation Oncology

## 2013-08-17 DIAGNOSIS — Z510 Encounter for antineoplastic radiation therapy: Secondary | ICD-10-CM | POA: Diagnosis not present

## 2013-08-19 NOTE — Progress Notes (Signed)
  Radiation Oncology         [phone] ________________________________  Name: [name] MRN: [id number]  Date: 08/17/2013  DOB: 21-Jun-1950  End of Treatment Note  Diagnosis: Breast cancer, bone metastases     Indication for treatment:  palliative       Radiation treatment dates:   08/03/2013-08/17/2013  Site/dose:  1) Left Ilium / 30 Gy in 10 fractions 2) C1-C3 / 30 Gy in 10 fractions  3) T8-T12 /  30 Gy in 10 fractions 4) left hip and proximal femur / 8 Gy in 1 fraction  Beams/energy:    1) AP/PA / 15MV 2) 3D / 10MV 3) 3D / 15 MV 4) AP/PA / 15 MV  Narrative: The patient tolerated radiation treatment relatively well.      Plan: The patient has completed radiation treatment. The patient will return to radiation oncology clinic for routine followup in one month. I advised them to call or return sooner if they have any questions or concerns related to their recovery or treatment.  -----------------------------------  Eppie Gibson, MD

## 2013-08-21 ENCOUNTER — Other Ambulatory Visit: Payer: Self-pay

## 2013-08-23 ENCOUNTER — Telehealth: Payer: Self-pay | Admitting: *Deleted

## 2013-08-23 NOTE — Telephone Encounter (Signed)
.    Ms Worster called and spoke with Rebeca Allegra to report that she is experiencing intermittent nausea and dry heaves and requested a script. Called and spoke with Awilda Metro to ask if Faslodex could cause nausea since she received an injection on 08/13/13.  Called a script for Compazine 10 mg po Q 6 hrs prn nausea, total 30 tabs as ordered by Awilda Metro, PA and informed Ms. Fulgham.  She expressed thanks.  Will follow-up later today.

## 2013-08-23 NOTE — Telephone Encounter (Signed)
Called [name] to check her status.  She stated that since taking Compazine she is feeling "much better.

## 2013-08-24 ENCOUNTER — Ambulatory Visit (HOSPITAL_BASED_OUTPATIENT_CLINIC_OR_DEPARTMENT_OTHER): Payer: 59

## 2013-08-24 VITALS — BP 108/64 | HR 91 | Temp 98.3°F

## 2013-08-24 DIAGNOSIS — C7952 Secondary malignant neoplasm of bone marrow: Principal | ICD-10-CM

## 2013-08-24 DIAGNOSIS — C50919 Malignant neoplasm of unspecified site of unspecified female breast: Secondary | ICD-10-CM

## 2013-08-24 DIAGNOSIS — C7951 Secondary malignant neoplasm of bone: Secondary | ICD-10-CM

## 2013-08-24 MED ORDER — FULVESTRANT 250 MG/5ML IM SOLN
500.0000 mg | Freq: Once | INTRAMUSCULAR | Status: AC
  Administered 2013-08-24: 500 mg via INTRAMUSCULAR
  Filled 2013-08-24: qty 10

## 2013-09-07 ENCOUNTER — Ambulatory Visit (HOSPITAL_BASED_OUTPATIENT_CLINIC_OR_DEPARTMENT_OTHER): Payer: 59

## 2013-09-07 VITALS — BP 106/54 | HR 82 | Temp 97.8°F

## 2013-09-07 DIAGNOSIS — Z5111 Encounter for antineoplastic chemotherapy: Secondary | ICD-10-CM

## 2013-09-07 DIAGNOSIS — C50919 Malignant neoplasm of unspecified site of unspecified female breast: Secondary | ICD-10-CM

## 2013-09-07 DIAGNOSIS — Z853 Personal history of malignant neoplasm of breast: Secondary | ICD-10-CM

## 2013-09-07 DIAGNOSIS — C7952 Secondary malignant neoplasm of bone marrow: Principal | ICD-10-CM

## 2013-09-07 DIAGNOSIS — C7951 Secondary malignant neoplasm of bone: Secondary | ICD-10-CM

## 2013-09-07 MED ORDER — FULVESTRANT 250 MG/5ML IM SOLN
500.0000 mg | Freq: Once | INTRAMUSCULAR | Status: AC
  Administered 2013-09-07: 500 mg via INTRAMUSCULAR
  Filled 2013-09-07: qty 10

## 2013-09-10 ENCOUNTER — Ambulatory Visit (HOSPITAL_BASED_OUTPATIENT_CLINIC_OR_DEPARTMENT_OTHER): Payer: 59 | Admitting: Oncology

## 2013-09-10 ENCOUNTER — Other Ambulatory Visit: Payer: Self-pay | Admitting: *Deleted

## 2013-09-10 ENCOUNTER — Ambulatory Visit: Payer: 59 | Admitting: Nutrition

## 2013-09-10 ENCOUNTER — Ambulatory Visit: Payer: 59

## 2013-09-10 ENCOUNTER — Telehealth: Payer: Self-pay | Admitting: Oncology

## 2013-09-10 VITALS — BP 122/64 | HR 94 | Temp 97.7°F | Resp 18 | Ht 63.0 in | Wt 223.6 lb

## 2013-09-10 DIAGNOSIS — Z853 Personal history of malignant neoplasm of breast: Secondary | ICD-10-CM

## 2013-09-10 DIAGNOSIS — R1310 Dysphagia, unspecified: Secondary | ICD-10-CM

## 2013-09-10 DIAGNOSIS — C50919 Malignant neoplasm of unspecified site of unspecified female breast: Secondary | ICD-10-CM

## 2013-09-10 DIAGNOSIS — C7951 Secondary malignant neoplasm of bone: Secondary | ICD-10-CM

## 2013-09-10 DIAGNOSIS — C50912 Malignant neoplasm of unspecified site of left female breast: Secondary | ICD-10-CM

## 2013-09-10 DIAGNOSIS — C7952 Secondary malignant neoplasm of bone marrow: Secondary | ICD-10-CM

## 2013-09-10 DIAGNOSIS — R5383 Other fatigue: Secondary | ICD-10-CM

## 2013-09-10 DIAGNOSIS — R5381 Other malaise: Secondary | ICD-10-CM

## 2013-09-10 MED ORDER — ALPRAZOLAM 0.5 MG PO TABS: 0.5000 mg | ORAL_TABLET | Freq: Three times a day (TID) | ORAL | Status: DC | PRN

## 2013-09-10 MED ORDER — HYDROCODONE-ACETAMINOPHEN 10-325 MG PO TABS: 1.0000 | ORAL_TABLET | ORAL | Status: DC | PRN

## 2013-09-10 NOTE — Progress Notes (Signed)
63 year old female diagnosed with metastatic breast cancer.  She is a patient of Dr. Benay Spice..  Past medical history includes diabetes, hypertension, arthritis, hyperlipidemia, and radiation therapy.  Medications include Xanax, vitamin C,  Vitamin D, Glucotrol, metformin, Crestor, and multivitamin.  Labs were reviewed.  Height: 63 inches. Weight: 223 pounds on August 24. Usual body weight 250 pounds. BMI: 39.5.  Patient reports taste alterations including metallic taste especially with meats.  Her nausea has improved, but she continues to have occasional dry heaves.  Dietary recall reveals patient is consuming alternate sources of protein.  Patient endorses weight loss and reports all her physicians have asked her not to lose anymore weight.  Nutrition diagnosis: Food and nutrition related knowledge deficit related to diagnosis of metastatic breast cancer and associated treatments as evidenced by no prior need for nutrition related information.  Intervention: Patient educated on strategies for improving taste.  Provided patient with fact sheet on taste alterations. Educated patient on strategies for dealing with nausea. Recommended patient consume primarily, low-fat plant-based diet with adequate protein.  Encouraged patient to resume activity with physician approval. Provided contact information.  Teach back method used.  Monitoring, evaluation, goals: Patient will tolerate adequate calories and protein to minimize further weight loss throughout treatment.  Patient will altered diet to be more plant-based with adequate protein.  Next visit: Patient will contact me if she develops further questions.  **Disclaimer: This note was dictated with voice recognition software. Similar sounding words can inadvertently be transcribed and this note may contain transcription errors which may not have been corrected upon publication of note.**

## 2013-09-10 NOTE — Telephone Encounter (Signed)
gv and printed appt sched and avs for pt for Sept and OCT °

## 2013-09-10 NOTE — Progress Notes (Signed)
  [facility] OFFICE PROGRESS NOTE   Diagnosis: Breast cancer  INTERVAL HISTORY:   [name] returns as scheduled. She completed palliative radiation 08/17/2013. She last received Faslodex on 09/07/2013. She has been treated with one dose of xygeva. She reports an abnormal taste after receiving Faslodex. The back pain has improved.  [name] reports developing odynophagia, altered taste, and malaise during and following radiation. She has occasional episodes of a cold feeling in the left arm and left leg.  Objective:  Vital signs in last 24 hours:  Blood pressure 122/64, pulse 94, temperature 97.7 F (36.5 C), temperature source Oral, resp. rate 18, height $RemoveBe'5\' 3"'mMfyITtPb$  (1.6 m), weight 223 lb 9.6 oz (101.424 kg), SpO2 100.00%.    HEENT: No thrush or ulcers Resp: Lungs clear bilaterally Cardio: Regular rate and rhythm GI: No hepatomegaly Vascular: No leg edema  Skin: Status post left mastectomy, no evidence for chest wall tumor recurrence     Medications: I have reviewed the patient's current medications.  Assessment/Plan: 1. 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 ER positive, PR positive, HER-2 negative She completed 4 cycles of adjuvant AC chemotherapy June 15 through September 03, 2008, and then completed the left chest wall radiation. She began Arimidex following an office visit on November 11, 2008. Bone scan 06/01/2013 suggestive of thoracic spine metastases, thoracic MRI 06/27/2011 consistent with multiple bone metastases involving the thoracolumbar spine  PET scan 07/09/2013 with multiple hypermetabolic bone lesions and hypermetabolic mediastinal nodes.  Left iliac lesion biopsy 07/26/2013. Pathology showed metastatic carcinoma, ER positive, PR positive, HER-2 negative Initiation of Faslodex 08/10/2013 2. Left chest subcutaneous  mass noted on exam October 04, 2008, status post biopsy by Dr. Brantley Stage with a benign pathology. 3. History of delayed healing of the left mastectomy incision. 4. Rheumatoid arthritis. 5. Diabetes. 6. Hypercholesterolemia. 7. Hypertension. 8. Family history of breast cancer. She has been evaluated at the genetic screening clinic. 9. 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  Disposition:  [name] has completed palliative radiation to multiple sites of bone metastases. The plan is to continue every three-month xgeva and monthly Faslodex. We will check a CA 27.29 when she returns for an office visit 11/02/2013. The radiation induced odynophagia and altered taste should improve.  Betsy Coder, MD  09/10/2013  9:52 AM

## 2013-09-21 ENCOUNTER — Encounter: Payer: Self-pay | Admitting: Radiation Oncology

## 2013-09-21 ENCOUNTER — Ambulatory Visit
Admission: RE | Admit: 2013-09-21 | Discharge: 2013-09-21 | Disposition: A | Discharge status: Home / Self Care | Payer: 59 | Source: Ambulatory Visit | Attending: Radiation Oncology | Admitting: Radiation Oncology

## 2013-09-21 VITALS — BP 115/68 | HR 79 | Temp 97.8°F | Wt 216.1 lb

## 2013-09-21 DIAGNOSIS — C7951 Secondary malignant neoplasm of bone: Secondary | ICD-10-CM

## 2013-09-21 DIAGNOSIS — C7952 Secondary malignant neoplasm of bone marrow: Principal | ICD-10-CM

## 2013-09-21 NOTE — Progress Notes (Signed)
Patient here for routine one month follow up completion of radiation for metastatic breast cancer to bone (left ilium, C1-C3 and T8-T12) Improvement in pain level since completion of radiation takes hydrocodone occasionally.At times patient has tingling/shooting sensation down left arm and right great toe.Improvement in taste buds.Skin looks great.Bowels are regular.No urinary problems.

## 2013-09-21 NOTE — Progress Notes (Signed)
Radiation Oncology         [phone]) [phone] ________________________________  Name: [name] MRN: [id number]  Date: 09/21/2013  DOB: September 11, 1950  Follow-Up Visit Note  Outpatient  CC: Lesia Sago, MD  Diagnosis and Prior Radiotherapy:   Breast cancer, bone metastases  Indication for treatment: palliative  Radiation treatment dates: 08/03/2013-08/17/2013  Site/dose:  1) Left Ilium / 30 Gy in 10 fractions  2) C1-C3 / 30 Gy in 10 fractions  3) T8-T12 / 30 Gy in 10 fractions  4) left hip and proximal femur / 8 Gy in 1 fraction  Narrative:  The patient returns today for routine follow-up. She reports improvement in pain level since completion of radiation takes hydrocodone occasionally. At times patient has tingling/shooting sensation down left arm and right great toe.Improvement in taste buds. Bowels are regular. No urinary problems. Pleased with the palliation she received from RT. She has lost about 16 lb in 2 months, partly due to taste changes.                             ALLERGIES:  is allergic to doxycycline.  Meds: Current Outpatient Prescriptions  Medication Sig Dispense Refill  . ALPRAZolam (XANAX) 0.5 MG tablet Take 1 tablet (0.5 mg total) by mouth every 8 (eight) hours as needed for anxiety.  60 tablet  0  . Ascorbic Acid (VITAMIN C) 500 MG tablet Take 500 mg by mouth daily.        . cholecalciferol (VITAMIN D) 1000 UNITS tablet Take 1,000 Units by mouth daily.      . diclofenac sodium (VOLTAREN) 1 % GEL Apply 2 g topically as needed (for left hip, hands,shoulders,back).       . diphenhydrAMINE (BENADRYL) 25 mg capsule Take 25 mg by mouth at bedtime as needed for sleep.      . folic acid (FOLVITE) 1 MG tablet Take 2 mg by mouth daily after breakfast.       . glipiZIDE (GLUCOTROL) 5 MG tablet Take 5 mg by mouth daily before breakfast.       . HYDROcodone-acetaminophen (NORCO) 10-325 MG per tablet Take 1 tablet by mouth every 4 (four) hours as  needed.  60 tablet  0  . hydroxychloroquine (PLAQUENIL) 200 MG tablet Take 200 mg by mouth at bedtime.       Marland Kitchen leflunomide (ARAVA) 10 MG tablet Take 10 mg by mouth daily.      . metFORMIN (GLUMETZA) 500 MG (MOD) 24 hr tablet Take 500 mg by mouth 2 (two) times daily with a meal.       . Multiple Vitamins-Minerals (CENTRUM SILVER PO) Take 1 tablet by mouth daily.       . prochlorperazine (COMPAZINE) 10 MG tablet       . quinapril (ACCUPRIL) 20 MG tablet Take 20 mg by mouth at bedtime.        . rosuvastatin (CRESTOR) 10 MG tablet Take 10 mg by mouth at bedtime.        No current facility-administered medications for this encounter.    Physical Findings: The patient is in no acute distress. Patient is alert and oriented.  weight is 216 lb 1.6 oz (98.022 kg). Her temperature is 97.8 F (36.6 C). Her blood pressure is 115/68 and her pulse is 79. Her oxygen saturation is 100%. .   No skin irritation in RT fields. Alopecia at nape of neck   Lab  Findings: Lab Results  Component Value Date   WBC 4.9 07/26/2013   HGB 12.2 07/26/2013   HCT 37.8 07/26/2013   MCV 89.2 07/26/2013   PLT 169 07/26/2013    Radiographic Findings: No results found.  Impression/Plan:  Great palliation from RT.  I think she would benefit from receiving guidance from [facility] of nutrition for controlled/modest weight loss, as she is overweight and adipose tissue could increase the estrogen in her body that is "feeding" her ER+ cancer. She is enthusiastic about learning more from [facility]. I will contact her about this.  She will increase her light exercise regimen from 2 to 3 times a week.  I encouraged her to continue followup with medical oncology. I will see her back on an as-needed basis. I have encouraged her to call if she has any issues or concerns in the future. I wished her the very best.  _____________________________________   Eppie Gibson, MD

## 2013-09-27 ENCOUNTER — Ambulatory Visit (HOSPITAL_BASED_OUTPATIENT_CLINIC_OR_DEPARTMENT_OTHER): Payer: 59 | Admitting: Oncology

## 2013-09-27 VITALS — BP 137/81 | HR 92 | Temp 97.7°F | Resp 18 | Ht 63.0 in | Wt 222.4 lb

## 2013-09-27 DIAGNOSIS — C50912 Malignant neoplasm of unspecified site of left female breast: Secondary | ICD-10-CM

## 2013-09-27 NOTE — Progress Notes (Signed)
Patient not seen.  Scheduled by mistake

## 2013-10-05 ENCOUNTER — Ambulatory Visit (HOSPITAL_BASED_OUTPATIENT_CLINIC_OR_DEPARTMENT_OTHER): Payer: 59

## 2013-10-05 VITALS — BP 121/61 | HR 88 | Temp 98.0°F

## 2013-10-05 DIAGNOSIS — C7952 Secondary malignant neoplasm of bone marrow: Principal | ICD-10-CM

## 2013-10-05 DIAGNOSIS — C50919 Malignant neoplasm of unspecified site of unspecified female breast: Secondary | ICD-10-CM

## 2013-10-05 DIAGNOSIS — Z5111 Encounter for antineoplastic chemotherapy: Secondary | ICD-10-CM

## 2013-10-05 DIAGNOSIS — Z853 Personal history of malignant neoplasm of breast: Secondary | ICD-10-CM

## 2013-10-05 DIAGNOSIS — C7951 Secondary malignant neoplasm of bone: Secondary | ICD-10-CM

## 2013-10-05 MED ORDER — FULVESTRANT 250 MG/5ML IM SOLN
500.0000 mg | Freq: Once | INTRAMUSCULAR | Status: AC
  Administered 2013-10-05: 500 mg via INTRAMUSCULAR
  Filled 2013-10-05: qty 10

## 2013-10-10 ENCOUNTER — Telehealth: Payer: Self-pay | Admitting: Nutrition

## 2013-10-10 NOTE — Telephone Encounter (Signed)
Called patient to schedule nutrition follow up for healthy diet for safe weight loss.  Patient not available.  Will continue to try to reach patient to schedule appointment.

## 2013-10-19 ENCOUNTER — Ambulatory Visit: Payer: 59

## 2013-10-19 ENCOUNTER — Other Ambulatory Visit: Payer: 59

## 2013-11-02 ENCOUNTER — Ambulatory Visit (HOSPITAL_BASED_OUTPATIENT_CLINIC_OR_DEPARTMENT_OTHER): Payer: 59

## 2013-11-02 ENCOUNTER — Ambulatory Visit (HOSPITAL_BASED_OUTPATIENT_CLINIC_OR_DEPARTMENT_OTHER): Payer: 59 | Admitting: Nurse Practitioner

## 2013-11-02 ENCOUNTER — Telehealth: Payer: Self-pay | Admitting: Oncology

## 2013-11-02 ENCOUNTER — Ambulatory Visit: Payer: 59

## 2013-11-02 ENCOUNTER — Other Ambulatory Visit (HOSPITAL_BASED_OUTPATIENT_CLINIC_OR_DEPARTMENT_OTHER): Payer: 59

## 2013-11-02 VITALS — BP 130/55 | HR 87 | Temp 98.0°F | Resp 19 | Ht 63.0 in | Wt 219.8 lb

## 2013-11-02 DIAGNOSIS — C50919 Malignant neoplasm of unspecified site of unspecified female breast: Secondary | ICD-10-CM

## 2013-11-02 DIAGNOSIS — Z5111 Encounter for antineoplastic chemotherapy: Secondary | ICD-10-CM

## 2013-11-02 DIAGNOSIS — Z853 Personal history of malignant neoplasm of breast: Secondary | ICD-10-CM

## 2013-11-02 DIAGNOSIS — Z23 Encounter for immunization: Secondary | ICD-10-CM

## 2013-11-02 DIAGNOSIS — C7951 Secondary malignant neoplasm of bone: Secondary | ICD-10-CM

## 2013-11-02 DIAGNOSIS — C773 Secondary and unspecified malignant neoplasm of axilla and upper limb lymph nodes: Secondary | ICD-10-CM

## 2013-11-02 DIAGNOSIS — E119 Type 2 diabetes mellitus without complications: Secondary | ICD-10-CM

## 2013-11-02 DIAGNOSIS — C50912 Malignant neoplasm of unspecified site of left female breast: Secondary | ICD-10-CM

## 2013-11-02 DIAGNOSIS — C7952 Secondary malignant neoplasm of bone marrow: Principal | ICD-10-CM

## 2013-11-02 DIAGNOSIS — Z170 Estrogen receptor positive status [ER+]: Secondary | ICD-10-CM

## 2013-11-02 DIAGNOSIS — M069 Rheumatoid arthritis, unspecified: Secondary | ICD-10-CM

## 2013-11-02 DIAGNOSIS — I10 Essential (primary) hypertension: Secondary | ICD-10-CM

## 2013-11-02 LAB — BASIC METABOLIC PANEL (CC13)
ANION GAP: 11 meq/L (ref 3–11)
BUN: 31.8 mg/dL — ABNORMAL HIGH (ref 7.0–26.0)
CHLORIDE: 111 meq/L — AB (ref 98–109)
CO2: 18 mEq/L — ABNORMAL LOW (ref 22–29)
Calcium: 9.5 mg/dL (ref 8.4–10.4)
Creatinine: 1.5 mg/dL — ABNORMAL HIGH (ref 0.6–1.1)
Glucose: 72 mg/dl (ref 70–140)
Potassium: 4.6 mEq/L (ref 3.5–5.1)
SODIUM: 140 meq/L (ref 136–145)

## 2013-11-02 MED ORDER — INFLUENZA VAC SPLIT QUAD 0.5 ML IM SUSY
0.5000 mL | PREFILLED_SYRINGE | Freq: Once | INTRAMUSCULAR | Status: AC
  Administered 2013-11-02: 0.5 mL via INTRAMUSCULAR
  Filled 2013-11-02: qty 0.5

## 2013-11-02 MED ORDER — HYDROCODONE-ACETAMINOPHEN 10-325 MG PO TABS: 1.0000 | ORAL_TABLET | ORAL | Status: DC | PRN

## 2013-11-02 MED ORDER — FULVESTRANT 250 MG/5ML IM SOLN
500.0000 mg | Freq: Once | INTRAMUSCULAR | Status: AC
  Administered 2013-11-02: 500 mg via INTRAMUSCULAR
  Filled 2013-11-02: qty 10

## 2013-11-02 MED ORDER — ALPRAZOLAM 0.5 MG PO TABS: 0.5000 mg | ORAL_TABLET | Freq: Three times a day (TID) | ORAL | Status: DC | PRN

## 2013-11-02 MED ORDER — DENOSUMAB 120 MG/1.7ML ~~LOC~~ SOLN
120.0000 mg | Freq: Once | SUBCUTANEOUS | Status: AC
  Administered 2013-11-02: 120 mg via SUBCUTANEOUS
  Filled 2013-11-02: qty 1.7

## 2013-11-02 NOTE — Telephone Encounter (Signed)
gv and printed appt sched and avs for pt for NOV and DEc.Marland KitchenMarland Kitchen

## 2013-11-02 NOTE — Progress Notes (Signed)
  [facility] OFFICE PROGRESS NOTE   Diagnosis:  Breast cancer  INTERVAL HISTORY:   [name] returns as scheduled. She last received Faslodex on 10/05/2013. No significant nausea/vomiting. She occasionally has "dry heaves". Stable back pain. She currently rates it as a 2 on the pain scale. She has an occasional sensation at the right back. She occasionally has a sharp pain beginning at the right side of her head and radiating to the neck. The pain occurs randomly and does not happen on a daily basis. She continues to have frequent bowel movements typically in the morning hours. No hot flashes.  Objective:  Vital signs in last 24 hours:  Blood pressure 130/55, pulse 87, temperature 98 F (36.7 C), temperature source Oral, resp. rate 19, height $RemoveBe'5\' 3"'LuTTlPHJM$  (1.6 m), weight 219 lb 12.8 oz (99.701 kg), SpO2 100.00%.    HEENT: No thrush or ulcers. Lymphatics: No palpable cervical, supraclavicular or axillary lymph nodes. Resp: Lungs clear bilaterally. Cardio: Regular rate and rhythm. GI: Abdomen soft and nontender. No hepatomegaly. Vascular: No leg edema.  Skin: Status post left mastectomy. No evidence for chest wall tumor recurrence    Lab Results:  Lab Results  Component Value Date   WBC 4.9 07/26/2013   HGB 12.2 07/26/2013   HCT 37.8 07/26/2013   MCV 89.2 07/26/2013   PLT 169 07/26/2013   NEUTROABS 3.8 07/03/2013    Imaging:  No results found.  Medications: I have reviewed the patient's current medications.  Assessment/Plan: 1. 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 ER positive, PR positive, HER-2 negative She completed 4 cycles of adjuvant AC chemotherapy June 15 through September 03, 2008, and then completed the left chest wall radiation. She began Arimidex following an office visit on November 11, 2008. Bone scan 06/01/2013 suggestive of  thoracic spine metastases, thoracic MRI 06/27/2011 consistent with multiple bone metastases involving the thoracolumbar spine  PET scan 07/09/2013 with multiple hypermetabolic bone lesions and hypermetabolic mediastinal nodes.  Left iliac lesion biopsy 07/26/2013. Pathology showed metastatic carcinoma, ER positive, PR positive, HER-2 negative  Initiation of Faslodex 08/10/2013. Xgeva every 3 months initiated 07/27/2013. 2. Left chest subcutaneous mass noted on exam October 04, 2008, status post biopsy by Dr. Brantley Stage with a benign pathology. 3. History of delayed healing of the left mastectomy incision. 4. Rheumatoid arthritis. 5. Diabetes. 6. Hypercholesterolemia. 7. Hypertension. 8. Family history of breast cancer. She has been evaluated at the genetic screening clinic. 9. 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   Disposition: [name] appears stable. Plan to continue monthly Faslodex and every three-month Xgeva. We will followup on the CA 27-29 from today. She will return for a followup visit in 2 months.  She will receive the influenza vaccine today.  Plan reviewed with Dr. Benay Spice.    [name] ANP/GNP-BC   11/02/2013  11:34 AM

## 2013-11-02 NOTE — Patient Instructions (Signed)
Denosumab injection What is this medicine? DENOSUMAB (den oh sue mab) slows bone breakdown. Prolia is used to treat osteoporosis in women after menopause and in men. Xgeva is used to prevent bone fractures and other bone problems caused by cancer bone metastases. Xgeva is also used to treat giant cell tumor of the bone. This medicine may be used for other purposes; ask your health care provider or pharmacist if you have questions. COMMON BRAND NAME(S): Prolia, XGEVA What should I tell my health care provider before I take this medicine? They need to know if you have any of these conditions: -dental disease -eczema -infection or history of infections -kidney disease or on dialysis -low blood calcium or vitamin D -malabsorption syndrome -scheduled to have surgery or tooth extraction -taking medicine that contains denosumab -thyroid or parathyroid disease -an unusual reaction to denosumab, other medicines, foods, dyes, or preservatives -pregnant or trying to get pregnant -breast-feeding How should I use this medicine? This medicine is for injection under the skin. It is given by a health care professional in a hospital or clinic setting. If you are getting Prolia, a special MedGuide will be given to you by the pharmacist with each prescription and refill. Be sure to read this information carefully each time. For Prolia, talk to your pediatrician regarding the use of this medicine in children. Special care may be needed. For Xgeva, talk to your pediatrician regarding the use of this medicine in children. While this drug may be prescribed for children as young as 13 years for selected conditions, precautions do apply. Overdosage: If you think you've taken too much of this medicine contact a poison control center or emergency room at once. Overdosage: If you think you have taken too much of this medicine contact a poison control center or emergency room at once. NOTE: This medicine is only for  you. Do not share this medicine with others. What if I miss a dose? It is important not to miss your dose. Call your doctor or health care professional if you are unable to keep an appointment. What may interact with this medicine? Do not take this medicine with any of the following medications: -other medicines containing denosumab This medicine may also interact with the following medications: -medicines that suppress the immune system -medicines that treat cancer -steroid medicines like prednisone or cortisone This list may not describe all possible interactions. Give your health care provider a list of all the medicines, herbs, non-prescription drugs, or dietary supplements you use. Also tell them if you smoke, drink alcohol, or use illegal drugs. Some items may interact with your medicine. What should I watch for while using this medicine? Visit your doctor or health care professional for regular checks on your progress. Your doctor or health care professional may order blood tests and other tests to see how you are doing. Call your doctor or health care professional if you get a cold or other infection while receiving this medicine. Do not treat yourself. This medicine may decrease your body's ability to fight infection. You should make sure you get enough calcium and vitamin D while you are taking this medicine, unless your doctor tells you not to. Discuss the foods you eat and the vitamins you take with your health care professional. See your dentist regularly. Brush and floss your teeth as directed. Before you have any dental work done, tell your dentist you are receiving this medicine. Do not become pregnant while taking this medicine or for 5 months after stopping   it. Women should inform their doctor if they wish to become pregnant or think they might be pregnant. There is a potential for serious side effects to an unborn child. Talk to your health care professional or pharmacist for more  information. What side effects may I notice from receiving this medicine? Side effects that you should report to your doctor or health care professional as soon as possible: -allergic reactions like skin rash, itching or hives, swelling of the face, lips, or tongue -breathing problems -chest pain -fast, irregular heartbeat -feeling faint or lightheaded, falls -fever, chills, or any other sign of infection -muscle spasms, tightening, or twitches -numbness or tingling -skin blisters or bumps, or is dry, peels, or red -slow healing or unexplained pain in the mouth or jaw -unusual bleeding or bruising Side effects that usually do not require medical attention (Report these to your doctor or health care professional if they continue or are bothersome.): -muscle pain -stomach upset, gas This list may not describe all possible side effects. Call your doctor for medical advice about side effects. You may report side effects to FDA at 1-800-FDA-1088. Where should I keep my medicine? This medicine is only given in a clinic, doctor's office, or other health care setting and will not be stored at home. NOTE: This sheet is a summary. It may not cover all possible information. If you have questions about this medicine, talk to your doctor, pharmacist, or health care provider.  2015, Elsevier/Gold Standard. (2011-07-05 12:37:47)  Fulvestrant injection What is this medicine? FULVESTRANT (ful VES trant) blocks the effects of estrogen. It is used to treat breast cancer in women past the age of menopause. This medicine may be used for other purposes; ask your health care provider or pharmacist if you have questions. COMMON BRAND NAME(S): FASLODEX What should I tell my health care provider before I take this medicine? They need to know if you have any of these conditions: -bleeding problems -liver disease -low levels of platelets in the blood -an unusual or allergic reaction to fulvestrant, other  medicines, foods, dyes, or preservatives -pregnant or trying to get pregnant -breast-feeding How should I use this medicine? This medicine is for injection into a muscle. It is usually given by a health care professional in a hospital or clinic setting. Talk to your pediatrician regarding the use of this medicine in children. Special care may be needed. Overdosage: If you think you have taken too much of this medicine contact a poison control center or emergency room at once. NOTE: This medicine is only for you. Do not share this medicine with others. What if I miss a dose? It is important not to miss your dose. Call your doctor or health care professional if you are unable to keep an appointment. What may interact with this medicine? -medicines that treat or prevent blood clots like warfarin, enoxaparin, and dalteparin This list may not describe all possible interactions. Give your health care provider a list of all the medicines, herbs, non-prescription drugs, or dietary supplements you use. Also tell them if you smoke, drink alcohol, or use illegal drugs. Some items may interact with your medicine. What should I watch for while using this medicine? Your condition will be monitored carefully while you are receiving this medicine. You will need important blood work done while you are taking this medicine. Do not become pregnant while taking this medicine. Women should inform their doctor if they wish to become pregnant or think they might be pregnant. There is   potential for serious side effects to an unborn child. Talk to your health care professional or pharmacist for more information. What side effects may I notice from receiving this medicine? Side effects that you should report to your doctor or health care professional as soon as possible: -allergic reactions like skin rash, itching or hives, swelling of the face, lips, or tongue -feeling faint or lightheaded, falls -fever or flu-like  symptoms -sore throat -vaginal bleeding Side effects that usually do not require medical attention (report to your doctor or health care professional if they continue or are bothersome): -aches, pains -constipation or diarrhea -headache -hot flashes -nausea, vomiting -pain at site where injected -stomach pain This list may not describe all possible side effects. Call your doctor for medical advice about side effects. You may report side effects to FDA at 1-800-FDA-1088. Where should I keep my medicine? This drug is given in a hospital or clinic and will not be stored at home. NOTE: This sheet is a summary. It may not cover all possible information. If you have questions about this medicine, talk to your doctor, pharmacist, or health care provider.  2015, Elsevier/Gold Standard. (2007-05-15 15:39:24) Avian Influenza Viruses Avian influenza or "bird flu" is also known as the H5N1 virus. It occurs naturally in wild and domestic birds. [place] flu is easily spread (contagious) among birds and is deadly to them. Though rare, bird flu can cause disease in humans.  CAUSES  Infected birds can shed the H5N1 virus through:   Feces.  Nasal secretions.  Saliva. Birds become infected when they come into contact with infected birds or contaminated surfaces. The bird flu virus is spread from country to country through international poultry trade or by migrating birds.  MODES OF TRANSMISSION TO HUMANS The bird flu virus does not normally infect humans. However, the virus can infect humans who have contact with infected birds, breathe in dust or touch surfaces contaminated with the virus. Human-to-human transmission of the H5N1 virus has been rare. The virus lacks the ability to grow itself (replicate) in humans. However, because all influenza viruses can mutate, scientists are concerned the H5N1 virus will someday replicate itself and make human-to-human transmission easier. If this happens, an influenza  "pandemic" could occur.  SYMPTOMS   Symptoms of H5N1 virus are similar to other influenza viruses:  Fever.  Cough.  Sore throat.  Nausea and vomiting.  Diarrhea.  Muscle aches.  Tiredness (malaise).  Some people may get inflammation or redness of the eyes (conjunctivitis).  Life-threatening complications may result in the death of the patient. These include:  Viral pneumonia.  Breathing (respiratory) distress syndrome.  Multi-organ failure. DIAGNOSIS   A person with a respiratory illness may be suffering from bird flu if direct or indirect contact has been made with infected birds. This includes handling or taking care of sick birds. The H5N1 virus may also be suspected if a person has breathed in particles or touched surfaces contaminated with the virus.  In addition to the above symptoms, a chest X-ray is useful to detect pneumonia.  Fluid specimens such as a sputum sample may be sent to a laboratory for further investigation.  Blood tests may be done to help detect the H5N1 virus. TREATMENT   The H5N1 virus has shown resistance to amantadine and rimantadine, which are two antiviral drugs commonly used for other influenza viruses. However, two other antivirals, oseltamivir and zanamivir, seem to be effective against the H5N1 strain.  If bird flu is suspected in a  person, treatment should start immediately without waiting for laboratory confirmation.  Treatment for the H5N1 strain is essentially the same as treating other influenza viruses. PREVENTION   Stay home from work, school, and errands when you are sick. Not being in contact with other people will help stop the spread of illness.  Cover your mouth and nose with your arm when coughing or sneezing. This may help keep those around you from getting sick.  Wash your hands often with warm water and soap. Illnesses are often spread when a person touches something that is contaminated with germs and then touches his  or her eyes, nose, or mouth.  Antiviral medications can help prevent the flu.  For optimal health, get plenty of rest, eat a healthy diet, and exercise. Document Released: 01/07/2003 Document Revised: 05/21/2013 Document Reviewed: 08/03/2007 [facility] Patient Information 2015 [place], [place]. This information is not intended to replace advice given to you by your health care provider. Make sure you discuss any questions you have with your health care provider.

## 2013-11-03 LAB — CANCER ANTIGEN 27.29: CA 27.29: 53 U/mL — ABNORMAL HIGH (ref 0–39)

## 2013-11-05 ENCOUNTER — Telehealth: Payer: Self-pay

## 2013-11-05 NOTE — Telephone Encounter (Signed)
Called and informed pt Ca27.29 is better at 53 and to follow up as scheduled. Pt verbalized understanding and denies any questions or concerns at this time.

## 2013-11-23 ENCOUNTER — Telehealth: Payer: Self-pay | Admitting: *Deleted

## 2013-11-23 NOTE — Telephone Encounter (Signed)
Reports noting a small amount of "pink" vaginal discharge when she wiped today. Not dripping in toilet or in panties. This is first incidence. Per Dr. Benay Spice : Just monitor for now. Call if it becomes consistent and increases in volume, may need to go to GYN. She understands and agrees.

## 2013-11-30 ENCOUNTER — Ambulatory Visit (HOSPITAL_BASED_OUTPATIENT_CLINIC_OR_DEPARTMENT_OTHER): Payer: 59

## 2013-11-30 DIAGNOSIS — C7952 Secondary malignant neoplasm of bone marrow: Principal | ICD-10-CM

## 2013-11-30 DIAGNOSIS — C50912 Malignant neoplasm of unspecified site of left female breast: Secondary | ICD-10-CM

## 2013-11-30 DIAGNOSIS — C50919 Malignant neoplasm of unspecified site of unspecified female breast: Secondary | ICD-10-CM

## 2013-11-30 DIAGNOSIS — C773 Secondary and unspecified malignant neoplasm of axilla and upper limb lymph nodes: Secondary | ICD-10-CM

## 2013-11-30 DIAGNOSIS — Z5111 Encounter for antineoplastic chemotherapy: Secondary | ICD-10-CM

## 2013-11-30 DIAGNOSIS — C7951 Secondary malignant neoplasm of bone: Secondary | ICD-10-CM

## 2013-11-30 MED ORDER — FULVESTRANT 250 MG/5ML IM SOLN
500.0000 mg | Freq: Once | INTRAMUSCULAR | Status: AC
  Administered 2013-11-30: 500 mg via INTRAMUSCULAR
  Filled 2013-11-30: qty 10

## 2013-11-30 NOTE — Patient Instructions (Signed)
Fulvestrant injection  What is this medicine?  FULVESTRANT (ful VES trant) blocks the effects of estrogen. It is used to treat breast cancer in women past the age of menopause.  This medicine may be used for other purposes; ask your health care provider or pharmacist if you have questions.  COMMON BRAND NAME(S): FASLODEX  What should I tell my health care provider before I take this medicine?  They need to know if you have any of these conditions:  -bleeding problems  -liver disease  -low levels of platelets in the blood  -an unusual or allergic reaction to fulvestrant, other medicines, foods, dyes, or preservatives  -pregnant or trying to get pregnant  -breast-feeding  How should I use this medicine?  This medicine is for injection into a muscle. It is usually given by a health care professional in a hospital or clinic setting.  Talk to your pediatrician regarding the use of this medicine in children. Special care may be needed.  Overdosage: If you think you have taken too much of this medicine contact a poison control center or emergency room at once.  NOTE: This medicine is only for you. Do not share this medicine with others.  What if I miss a dose?  It is important not to miss your dose. Call your doctor or health care professional if you are unable to keep an appointment.  What may interact with this medicine?  -medicines that treat or prevent blood clots like warfarin, enoxaparin, and dalteparin  This list may not describe all possible interactions. Give your health care provider a list of all the medicines, herbs, non-prescription drugs, or dietary supplements you use. Also tell them if you smoke, drink alcohol, or use illegal drugs. Some items may interact with your medicine.  What should I watch for while using this medicine?  Your condition will be monitored carefully while you are receiving this medicine. You will need important blood work done while you are taking this medicine.  Do not become pregnant  while taking this medicine. Women should inform their doctor if they wish to become pregnant or think they might be pregnant. There is a potential for serious side effects to an unborn child. Talk to your health care professional or pharmacist for more information.  What side effects may I notice from receiving this medicine?  Side effects that you should report to your doctor or health care professional as soon as possible:  -allergic reactions like skin rash, itching or hives, swelling of the face, lips, or tongue  -feeling faint or lightheaded, falls  -fever or flu-like symptoms  -sore throat  -vaginal bleeding  Side effects that usually do not require medical attention (report to your doctor or health care professional if they continue or are bothersome):  -aches, pains  -constipation or diarrhea  -headache  -hot flashes  -nausea, vomiting  -pain at site where injected  -stomach pain  This list may not describe all possible side effects. Call your doctor for medical advice about side effects. You may report side effects to FDA at 1-800-FDA-1088.  Where should I keep my medicine?  This drug is given in a hospital or clinic and will not be stored at home.  NOTE: This sheet is a summary. It may not cover all possible information. If you have questions about this medicine, talk to your doctor, pharmacist, or health care provider.   2015, Elsevier/Gold Standard. (2007-05-15 15:39:24)

## 2013-12-28 ENCOUNTER — Telehealth: Payer: Self-pay | Admitting: Oncology

## 2013-12-28 ENCOUNTER — Ambulatory Visit (HOSPITAL_BASED_OUTPATIENT_CLINIC_OR_DEPARTMENT_OTHER): Payer: 59 | Admitting: Oncology

## 2013-12-28 ENCOUNTER — Ambulatory Visit: Payer: 59

## 2013-12-28 ENCOUNTER — Ambulatory Visit (HOSPITAL_BASED_OUTPATIENT_CLINIC_OR_DEPARTMENT_OTHER): Payer: 59

## 2013-12-28 ENCOUNTER — Telehealth: Payer: Self-pay | Admitting: *Deleted

## 2013-12-28 ENCOUNTER — Other Ambulatory Visit (HOSPITAL_BASED_OUTPATIENT_CLINIC_OR_DEPARTMENT_OTHER): Payer: 59

## 2013-12-28 VITALS — BP 133/68 | HR 94 | Temp 98.2°F | Resp 18 | Ht 63.0 in | Wt 212.3 lb

## 2013-12-28 DIAGNOSIS — C773 Secondary and unspecified malignant neoplasm of axilla and upper limb lymph nodes: Secondary | ICD-10-CM

## 2013-12-28 DIAGNOSIS — C7951 Secondary malignant neoplasm of bone: Secondary | ICD-10-CM

## 2013-12-28 DIAGNOSIS — R152 Fecal urgency: Secondary | ICD-10-CM

## 2013-12-28 DIAGNOSIS — M069 Rheumatoid arthritis, unspecified: Secondary | ICD-10-CM

## 2013-12-28 DIAGNOSIS — Z5111 Encounter for antineoplastic chemotherapy: Secondary | ICD-10-CM

## 2013-12-28 DIAGNOSIS — C50919 Malignant neoplasm of unspecified site of unspecified female breast: Secondary | ICD-10-CM

## 2013-12-28 DIAGNOSIS — E119 Type 2 diabetes mellitus without complications: Secondary | ICD-10-CM

## 2013-12-28 DIAGNOSIS — G893 Neoplasm related pain (acute) (chronic): Secondary | ICD-10-CM

## 2013-12-28 DIAGNOSIS — C7952 Secondary malignant neoplasm of bone marrow: Principal | ICD-10-CM

## 2013-12-28 DIAGNOSIS — C50912 Malignant neoplasm of unspecified site of left female breast: Secondary | ICD-10-CM

## 2013-12-28 DIAGNOSIS — I10 Essential (primary) hypertension: Secondary | ICD-10-CM

## 2013-12-28 LAB — BASIC METABOLIC PANEL (CC13)
Anion Gap: 13 mEq/L — ABNORMAL HIGH (ref 3–11)
BUN: 33.9 mg/dL — AB (ref 7.0–26.0)
CHLORIDE: 108 meq/L (ref 98–109)
CO2: 20 mEq/L — ABNORMAL LOW (ref 22–29)
Calcium: 9.3 mg/dL (ref 8.4–10.4)
Creatinine: 1.2 mg/dL — ABNORMAL HIGH (ref 0.6–1.1)
EGFR: 46 mL/min/{1.73_m2} — ABNORMAL LOW (ref >90)
Glucose: 110 mg/dl (ref 70–140)
POTASSIUM: 4.3 meq/L (ref 3.5–5.1)
SODIUM: 141 meq/L (ref 136–145)

## 2013-12-28 LAB — CANCER ANTIGEN 27.29: CA 27.29: 35 U/mL (ref 0–39)

## 2013-12-28 LAB — ALKALINE PHOSPHATASE: ALK PHOS: 154 U/L — AB (ref 39–117)

## 2013-12-28 MED ORDER — DIPHENHYDRAMINE HCL 50 MG/ML IJ SOLN: 25.0000 mg | Freq: Once | INTRAMUSCULAR | Status: DC | PRN

## 2013-12-28 MED ORDER — DIPHENHYDRAMINE HCL 50 MG/ML IJ SOLN: 50.0000 mg | Freq: Once | INTRAMUSCULAR | Status: DC | PRN

## 2013-12-28 MED ORDER — EPINEPHRINE HCL 0.1 MG/ML IJ SOLN: 0.2500 mg | Freq: Once | INTRAMUSCULAR | Status: DC | PRN

## 2013-12-28 MED ORDER — ALBUTEROL SULFATE (2.5 MG/3ML) 0.083% IN NEBU
2.5000 mg | INHALATION_SOLUTION | Freq: Once | RESPIRATORY_TRACT | Status: DC | PRN
  Filled 2013-12-28: qty 3

## 2013-12-28 MED ORDER — HYDROCODONE-ACETAMINOPHEN 10-325 MG PO TABS: 1.0000 | ORAL_TABLET | ORAL | Status: DC | PRN

## 2013-12-28 MED ORDER — ALPRAZOLAM 0.5 MG PO TABS: 0.5000 mg | ORAL_TABLET | Freq: Three times a day (TID) | ORAL | Status: DC | PRN

## 2013-12-28 MED ORDER — METHYLPREDNISOLONE SODIUM SUCC 125 MG IJ SOLR: 125.0000 mg | Freq: Once | INTRAMUSCULAR | Status: DC | PRN

## 2013-12-28 MED ORDER — SODIUM CHLORIDE 0.9 % IV SOLN: Freq: Once | INTRAVENOUS | Status: DC | PRN

## 2013-12-28 MED ORDER — FULVESTRANT 250 MG/5ML IM SOLN
500.0000 mg | Freq: Once | INTRAMUSCULAR | Status: AC
  Administered 2013-12-28: 250 mg via INTRAMUSCULAR
  Filled 2013-12-28: qty 10

## 2013-12-28 NOTE — Progress Notes (Signed)
  [facility] OFFICE PROGRESS NOTE   Diagnosis: Breast cancer  INTERVAL HISTORY:   [name] returns as scheduled. She continues monthly Faslodex. Stable back pain. She takes hydrocodone once or twice daily for relief of pain. She complains of rectal urgency intermittently. She feels her stomach growling and has to get to the bathroom quickly. She otherwise has no difficulty with bowel or bladder control.  Objective:  Vital signs in last 24 hours:  Blood pressure 133/68, pulse 94, temperature 98.2 F (36.8 C), temperature source Oral, resp. rate 18, height $RemoveBe'5\' 3"'[id number]$  (1.6 m), weight 212 lb 4.8 oz (96.299 kg).    HEENT: Neck without mass Lymphatics: No cervical, supra-clavicular, or axillary nodes Resp: Lungs clear bilaterally Cardio: Regular rate and rhythm GI: No hepatomegaly, nontender Vascular: No leg edema Breast: Status post left mastectomy. No evidence for chest wall tumor recurrence.    Lab Results: Potassium 4.3, creatinine 1.2, BUN 33.9, calcium 9.3   11/02/2013-CA 27.29:  53   Medications: I have reviewed the patient's current medications.  Assessment/Plan: 1. 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 ER positive, PR positive, HER-2 negative She completed 4 cycles of adjuvant AC chemotherapy June 15 through September 03, 2008, and then completed the left chest wall radiation. She began Arimidex following an office visit on November 11, 2008.  Bone scan 06/01/2013 suggestive of thoracic spine metastases, thoracic MRI 06/27/2011 consistent with multiple bone metastases involving the thoracolumbar spine   PET scan 07/09/2013 with multiple hypermetabolic bone lesions and hypermetabolic mediastinal nodes.   Left iliac lesion biopsy 07/26/2013. Pathology showed metastatic carcinoma, ER positive, PR positive, HER-2 negative    Initiation of Faslodex 08/10/2013.  Xgeva every 3 months initiated 07/27/2013. 2. Left chest subcutaneous mass noted on exam October 04, 2008, status post biopsy by Dr. Brantley Stage with a benign pathology. 3. History of delayed healing of the left mastectomy incision. 4. Rheumatoid arthritis. 5. Diabetes. 6. Hypercholesterolemia. 7. Hypertension. 8. Family history of breast cancer. She has been evaluated at the genetic screening clinic. 9. 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 10. Diarrhea/urgency--most likely unrelated to the breast cancer diagnosis   Disposition:  She appears stable from an oncology standpoint. The plan is to continue monthly Faslodex. She receives xgeva every 3 months.   [name] will undergo a restaging PET scan prior to an office visit in one month.   She will contact us for worsening of the rectal urgency or for new neurologic symptoms.   Betsy Coder, MD  12/28/2013  1:33 PM

## 2013-12-28 NOTE — Telephone Encounter (Signed)
Gave asv & cal for Jan. °

## 2013-12-28 NOTE — Telephone Encounter (Signed)
Called and informed patient that CA 27-29 tumor marker is better and now in normal range.  Per Elby Showers. Marcello Moores, NP.  Patient verbalized understanding.

## 2013-12-28 NOTE — Telephone Encounter (Signed)
-----   Message from Owens Shark, NP sent at 12/28/2013  3:47 PM EST ----- Please let her know the CA 27-29 tumor marker is better and now in normal range.

## 2013-12-31 ENCOUNTER — Telehealth: Payer: Self-pay | Admitting: *Deleted

## 2013-12-31 NOTE — Telephone Encounter (Signed)
Per Dr. Benay Spice; notified pt that Alkaline Phos is better, coming down.  Pt verbalized understanding and expressed appreciation for call.

## 2013-12-31 NOTE — Telephone Encounter (Signed)
-----  Message from Ladell Pier, MD sent at 12/28/2013  6:06 PM EST ----- Please call patient, alk. Phos is better

## 2014-01-23 ENCOUNTER — Ambulatory Visit (HOSPITAL_COMMUNITY)
Admission: RE | Admit: 2014-01-23 | Discharge: 2014-01-23 | Disposition: A | Discharge status: Home / Self Care | Payer: 59 | Source: Ambulatory Visit | Attending: Oncology | Admitting: Oncology

## 2014-01-23 DIAGNOSIS — C7951 Secondary malignant neoplasm of bone: Secondary | ICD-10-CM | POA: Diagnosis not present

## 2014-01-23 DIAGNOSIS — C50912 Malignant neoplasm of unspecified site of left female breast: Secondary | ICD-10-CM | POA: Diagnosis present

## 2014-01-23 DIAGNOSIS — C50919 Malignant neoplasm of unspecified site of unspecified female breast: Secondary | ICD-10-CM

## 2014-01-23 LAB — GLUCOSE, CAPILLARY: Glucose-Capillary: 100 mg/dL — ABNORMAL HIGH (ref 70–99)

## 2014-01-23 MED ORDER — FLUDEOXYGLUCOSE F - 18 (FDG) INJECTION
10.5000 | Freq: Once | INTRAVENOUS | Status: AC | PRN
  Administered 2014-01-23: 10.5 via INTRAVENOUS

## 2014-01-24 ENCOUNTER — Other Ambulatory Visit: Payer: Self-pay | Admitting: Nurse Practitioner

## 2014-01-24 DIAGNOSIS — C50919 Malignant neoplasm of unspecified site of unspecified female breast: Secondary | ICD-10-CM

## 2014-01-24 DIAGNOSIS — C7952 Secondary malignant neoplasm of bone marrow: Secondary | ICD-10-CM

## 2014-01-24 DIAGNOSIS — C7951 Secondary malignant neoplasm of bone: Secondary | ICD-10-CM

## 2014-01-25 ENCOUNTER — Ambulatory Visit (HOSPITAL_BASED_OUTPATIENT_CLINIC_OR_DEPARTMENT_OTHER): Payer: 59 | Admitting: Nurse Practitioner

## 2014-01-25 ENCOUNTER — Ambulatory Visit (HOSPITAL_BASED_OUTPATIENT_CLINIC_OR_DEPARTMENT_OTHER): Payer: 59

## 2014-01-25 ENCOUNTER — Other Ambulatory Visit (HOSPITAL_BASED_OUTPATIENT_CLINIC_OR_DEPARTMENT_OTHER): Payer: 59

## 2014-01-25 ENCOUNTER — Telehealth: Payer: Self-pay | Admitting: Nurse Practitioner

## 2014-01-25 VITALS — BP 133/48 | HR 99 | Temp 97.5°F | Resp 19 | Ht 63.0 in | Wt 211.3 lb

## 2014-01-25 DIAGNOSIS — C50912 Malignant neoplasm of unspecified site of left female breast: Secondary | ICD-10-CM

## 2014-01-25 DIAGNOSIS — I10 Essential (primary) hypertension: Secondary | ICD-10-CM

## 2014-01-25 DIAGNOSIS — C773 Secondary and unspecified malignant neoplasm of axilla and upper limb lymph nodes: Secondary | ICD-10-CM

## 2014-01-25 DIAGNOSIS — C7951 Secondary malignant neoplasm of bone: Secondary | ICD-10-CM

## 2014-01-25 DIAGNOSIS — C7952 Secondary malignant neoplasm of bone marrow: Secondary | ICD-10-CM

## 2014-01-25 DIAGNOSIS — R150 Incomplete defecation: Secondary | ICD-10-CM

## 2014-01-25 DIAGNOSIS — C50919 Malignant neoplasm of unspecified site of unspecified female breast: Secondary | ICD-10-CM

## 2014-01-25 DIAGNOSIS — Z5111 Encounter for antineoplastic chemotherapy: Secondary | ICD-10-CM

## 2014-01-25 LAB — COMPREHENSIVE METABOLIC PANEL (CC13)
ALBUMIN: 3.5 g/dL (ref 3.5–5.0)
ALT: 13 U/L (ref 0–55)
ANION GAP: 9 meq/L (ref 3–11)
AST: 19 U/L (ref 5–34)
Alkaline Phosphatase: 161 U/L — ABNORMAL HIGH (ref 40–150)
BILIRUBIN TOTAL: 0.31 mg/dL (ref 0.20–1.20)
BUN: 25.4 mg/dL (ref 7.0–26.0)
CO2: 22 mEq/L (ref 22–29)
Calcium: 9.3 mg/dL (ref 8.4–10.4)
Chloride: 109 mEq/L (ref 98–109)
Creatinine: 1.1 mg/dL (ref 0.6–1.1)
EGFR: 51 mL/min/{1.73_m2} — ABNORMAL LOW (ref >90)
GLUCOSE: 89 mg/dL (ref 70–140)
POTASSIUM: 4.4 meq/L (ref 3.5–5.1)
Sodium: 140 mEq/L (ref 136–145)
Total Protein: 6.6 g/dL (ref 6.4–8.3)

## 2014-01-25 LAB — CBC WITH DIFFERENTIAL/PLATELET
BASO%: 0 % (ref 0.0–2.0)
Basophils Absolute: 0 10*3/uL (ref 0.0–0.1)
EOS%: 5.2 % (ref 0.0–7.0)
Eosinophils Absolute: 0.2 10*3/uL (ref 0.0–0.5)
HEMATOCRIT: 33.8 % — AB (ref 34.8–46.6)
HGB: 10.7 g/dL — ABNORMAL LOW (ref 11.6–15.9)
LYMPH%: 8.8 % — ABNORMAL LOW (ref 14.0–49.7)
MCH: 30 pg (ref 25.1–34.0)
MCHC: 31.7 g/dL (ref 31.5–36.0)
MCV: 94.7 fL (ref 79.5–101.0)
MONO#: 0.5 10*3/uL (ref 0.1–0.9)
MONO%: 12.3 % (ref 0.0–14.0)
NEUT%: 73.7 % (ref 38.4–76.8)
NEUTROS ABS: 3 10*3/uL (ref 1.5–6.5)
PLATELETS: 133 10*3/uL — AB (ref 145–400)
RBC: 3.57 10*6/uL — ABNORMAL LOW (ref 3.70–5.45)
RDW: 14 % (ref 11.2–14.5)
WBC: 4.1 10*3/uL (ref 3.9–10.3)
lymph#: 0.4 10*3/uL — ABNORMAL LOW (ref 0.9–3.3)

## 2014-01-25 MED ORDER — DENOSUMAB 120 MG/1.7ML ~~LOC~~ SOLN
120.0000 mg | Freq: Once | SUBCUTANEOUS | Status: AC
  Administered 2014-01-25: 120 mg via SUBCUTANEOUS
  Filled 2014-01-25: qty 1.7

## 2014-01-25 MED ORDER — FULVESTRANT 250 MG/5ML IM SOLN
500.0000 mg | Freq: Once | INTRAMUSCULAR | Status: AC
  Administered 2014-01-25: 500 mg via INTRAMUSCULAR
  Filled 2014-01-25: qty 10

## 2014-01-25 MED ORDER — HYDROCODONE-ACETAMINOPHEN 10-325 MG PO TABS: 1.0000 | ORAL_TABLET | ORAL | Status: DC | PRN

## 2014-01-25 NOTE — Telephone Encounter (Signed)
Pt confirmed labs/ov/inj per 01/08 POF, gave pt AVS.... KJ  °

## 2014-01-25 NOTE — Patient Instructions (Signed)
Fulvestrant injection What is this medicine? FULVESTRANT (ful VES trant) blocks the effects of estrogen. It is used to treat breast cancer in women past the age of menopause. This medicine may be used for other purposes; ask your health care provider or pharmacist if you have questions. COMMON BRAND NAME(S): FASLODEX What should I tell my health care provider before I take this medicine? They need to know if you have any of these conditions: -bleeding problems -liver disease -low levels of platelets in the blood -an unusual or allergic reaction to fulvestrant, other medicines, foods, dyes, or preservatives -pregnant or trying to get pregnant -breast-feeding How should I use this medicine? This medicine is for injection into a muscle. It is usually given by a health care professional in a hospital or clinic setting. Talk to your pediatrician regarding the use of this medicine in children. Special care may be needed. Overdosage: If you think you have taken too much of this medicine contact a poison control center or emergency room at once. NOTE: This medicine is only for you. Do not share this medicine with others. What if I miss a dose? It is important not to miss your dose. Call your doctor or health care professional if you are unable to keep an appointment. What may interact with this medicine? -medicines that treat or prevent blood clots like warfarin, enoxaparin, and dalteparin This list may not describe all possible interactions. Give your health care provider a list of all the medicines, herbs, non-prescription drugs, or dietary supplements you use. Also tell them if you smoke, drink alcohol, or use illegal drugs. Some items may interact with your medicine. What should I watch for while using this medicine? Your condition will be monitored carefully while you are receiving this medicine. You will need important blood work done while you are taking this medicine. Do not become pregnant  while taking this medicine. Women should inform their doctor if they wish to become pregnant or think they might be pregnant. There is a potential for serious side effects to an unborn child. Talk to your health care professional or pharmacist for more information. What side effects may I notice from receiving this medicine? Side effects that you should report to your doctor or health care professional as soon as possible: -allergic reactions like skin rash, itching or hives, swelling of the face, lips, or tongue -feeling faint or lightheaded, falls -fever or flu-like symptoms -sore throat -vaginal bleeding Side effects that usually do not require medical attention (report to your doctor or health care professional if they continue or are bothersome): -aches, pains -constipation or diarrhea -headache -hot flashes -nausea, vomiting -pain at site where injected -stomach pain This list may not describe all possible side effects. Call your doctor for medical advice about side effects. You may report side effects to FDA at 1-800-FDA-1088. Where should I keep my medicine? This drug is given in a hospital or clinic and will not be stored at home. NOTE: This sheet is a summary. It may not cover all possible information. If you have questions about this medicine, talk to your doctor, pharmacist, or health care provider.  2015, Elsevier/Gold Standard. (2007-05-15 15:39:24) Denosumab injection What is this medicine? DENOSUMAB (den oh sue mab) slows bone breakdown. Prolia is used to treat osteoporosis in women after menopause and in men. Delton See is used to prevent bone fractures and other bone problems caused by cancer bone metastases. Delton See is also used to treat giant cell tumor of the bone. This  medicine may be used for other purposes; ask your health care provider or pharmacist if you have questions. COMMON BRAND NAME(S): Prolia, XGEVA What should I tell my health care provider before I take this  medicine? They need to know if you have any of these conditions: -dental disease -eczema -infection or history of infections -kidney disease or on dialysis -low blood calcium or vitamin D -malabsorption syndrome -scheduled to have surgery or tooth extraction -taking medicine that contains denosumab -thyroid or parathyroid disease -an unusual reaction to denosumab, other medicines, foods, dyes, or preservatives -pregnant or trying to get pregnant -breast-feeding How should I use this medicine? This medicine is for injection under the skin. It is given by a health care professional in a hospital or clinic setting. If you are getting Prolia, a special MedGuide will be given to you by the pharmacist with each prescription and refill. Be sure to read this information carefully each time. For Prolia, talk to your pediatrician regarding the use of this medicine in children. Special care may be needed. For Delton See, talk to your pediatrician regarding the use of this medicine in children. While this drug may be prescribed for children as young as 13 years for selected conditions, precautions do apply. Overdosage: If you think you've taken too much of this medicine contact a poison control center or emergency room at once. Overdosage: If you think you have taken too much of this medicine contact a poison control center or emergency room at once. NOTE: This medicine is only for you. Do not share this medicine with others. What if I miss a dose? It is important not to miss your dose. Call your doctor or health care professional if you are unable to keep an appointment. What may interact with this medicine? Do not take this medicine with any of the following medications: -other medicines containing denosumab This medicine may also interact with the following medications: -medicines that suppress the immune system -medicines that treat cancer -steroid medicines like prednisone or cortisone This list  may not describe all possible interactions. Give your health care provider a list of all the medicines, herbs, non-prescription drugs, or dietary supplements you use. Also tell them if you smoke, drink alcohol, or use illegal drugs. Some items may interact with your medicine. What should I watch for while using this medicine? Visit your doctor or health care professional for regular checks on your progress. Your doctor or health care professional may order blood tests and other tests to see how you are doing. Call your doctor or health care professional if you get a cold or other infection while receiving this medicine. Do not treat yourself. This medicine may decrease your body's ability to fight infection. You should make sure you get enough calcium and vitamin D while you are taking this medicine, unless your doctor tells you not to. Discuss the foods you eat and the vitamins you take with your health care professional. See your dentist regularly. Brush and floss your teeth as directed. Before you have any dental work done, tell your dentist you are receiving this medicine. Do not become pregnant while taking this medicine or for 5 months after stopping it. Women should inform their doctor if they wish to become pregnant or think they might be pregnant. There is a potential for serious side effects to an unborn child. Talk to your health care professional or pharmacist for more information. What side effects may I notice from receiving this medicine? Side effects that you  should report to your doctor or health care professional as soon as possible: -allergic reactions like skin rash, itching or hives, swelling of the face, lips, or tongue -breathing problems -chest pain -fast, irregular heartbeat -feeling faint or lightheaded, falls -fever, chills, or any other sign of infection -muscle spasms, tightening, or twitches -numbness or tingling -skin blisters or bumps, or is dry, peels, or red -slow  healing or unexplained pain in the mouth or jaw -unusual bleeding or bruising Side effects that usually do not require medical attention (Report these to your doctor or health care professional if they continue or are bothersome.): -muscle pain -stomach upset, gas This list may not describe all possible side effects. Call your doctor for medical advice about side effects. You may report side effects to FDA at 1-800-FDA-1088. Where should I keep my medicine? This medicine is only given in a clinic, doctor's office, or other health care setting and will not be stored at home. NOTE: This sheet is a summary. It may not cover all possible information. If you have questions about this medicine, talk to your doctor, pharmacist, or health care provider.  2015, Elsevier/Gold Standard. (2011-07-05 12:37:47)

## 2014-01-25 NOTE — Progress Notes (Signed)
  [facility] OFFICE PROGRESS NOTE   Diagnosis:  Breast cancer  INTERVAL HISTORY:   [name] returns as scheduled. She continues monthly Faslodex and every three-month Xgeva. She has occasional mild nausea. No vomiting. No hot flashes. For about 5 days after each Faslodex injection she notes a foul taste in her mouth and a foul odor to her urine.  She continues to exercise. She notes increased left hip pain with increased activity. She takes Norco about 2 times a day.  Objective:  Vital signs in last 24 hours:  Blood pressure 133/48, pulse 99, temperature 97.5 F (36.4 C), temperature source Oral, resp. rate 19, height $RemoveBe'5\' 3"'sqaSLwzqp$  (1.6 m), weight 211 lb 4.8 oz (95.845 kg), SpO2 99 %.    HEENT: No thrush or ulcers.  Lymphatics: No palpable cervical, supraclavicular or axillary lymph nodes. Resp: Lungs clear bilaterally. Cardio: Regular rate and rhythm. GI: Abdomen soft and nontender. No hepatomegaly. Vascular: No leg edema.  Breast: Status post left mastectomy. No evidence for chest wall tumor recurrence.   Lab Results:  Lab Results  Component Value Date   WBC 4.1 01/25/2014   HGB 10.7* 01/25/2014   HCT 33.8* 01/25/2014   MCV 94.7 01/25/2014   PLT 133* 01/25/2014   NEUTROABS 3.0 01/25/2014    Imaging:  No results found.  Medications: I have reviewed the patient's current medications.  Assessment/Plan: 1. 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 ER positive, PR positive, HER-2 negative She completed 4 cycles of adjuvant AC chemotherapy June 15 through September 03, 2008, and then completed the left chest wall radiation. She began Arimidex following an office visit on November 11, 2008.  Bone scan 06/01/2013 suggestive of thoracic spine metastases, thoracic MRI 06/27/2011 consistent with multiple bone metastases involving the  thoracolumbar spine   PET scan 07/09/2013 with multiple hypermetabolic bone lesions and hypermetabolic mediastinal nodes.   Left iliac lesion biopsy 07/26/2013. Pathology showed metastatic carcinoma, ER positive, PR positive, HER-2 negative   Initiation of Faslodex 08/10/2013.  Xgeva every 3 months initiated 07/27/2013.  Restaging PET scan 01/23/2014 with no residual hypermetabolic mediastinal disease; marked improvement in the metastatic bone disease. 2. Left chest subcutaneous mass noted on exam October 04, 2008, status post biopsy by Dr. Brantley Stage with a benign pathology. 3. History of delayed healing of the left mastectomy incision. 4. Rheumatoid arthritis. 5. Diabetes. 6. Hypercholesterolemia. 7. Hypertension. 8. Family history of breast cancer. She has been evaluated at the genetic screening clinic. 9. 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 10. Diarrhea/urgency--most likely unrelated to the breast cancer diagnosis   Disposition: [name] appears stable. The restaging PET scan shows significant improvement. The plan is to continue monthly Faslodex. She will also continue every 3 month Xgeva. She will return for a follow-up visit in 3 months.  Plan reviewed with Dr. Benay Spice.    [name] ANP/GNP-BC   01/25/2014  11:48 AM

## 2014-01-26 LAB — CANCER ANTIGEN 27.29: CA 27.29: 32 U/mL (ref 0–39)

## 2014-01-28 ENCOUNTER — Telehealth: Payer: Self-pay

## 2014-01-28 NOTE — Telephone Encounter (Signed)
Called and informed pt of CA-27-29 results. Pt verbalized understanding and denies any questions or concerns at this time.

## 2014-01-28 NOTE — Telephone Encounter (Signed)
-----   Message from Owens Shark, NP sent at 01/28/2014  9:01 AM EST ----- Please let patient know CA-27-29 remains in normal range.

## 2014-02-14 ENCOUNTER — Ambulatory Visit: Payer: 59

## 2014-02-15 ENCOUNTER — Telehealth: Payer: Self-pay | Admitting: Oncology

## 2014-02-15 NOTE — Telephone Encounter (Signed)
Lvm advising appt chg from 4/1 [facility] to 3/31 @ 8.30am.

## 2014-02-22 ENCOUNTER — Other Ambulatory Visit: Payer: Self-pay | Admitting: Nurse Practitioner

## 2014-02-22 ENCOUNTER — Ambulatory Visit (HOSPITAL_BASED_OUTPATIENT_CLINIC_OR_DEPARTMENT_OTHER): Payer: 59

## 2014-02-22 VITALS — BP 124/48 | HR 82 | Temp 98.1°F

## 2014-02-22 DIAGNOSIS — C50919 Malignant neoplasm of unspecified site of unspecified female breast: Secondary | ICD-10-CM

## 2014-02-22 DIAGNOSIS — C7952 Secondary malignant neoplasm of bone marrow: Secondary | ICD-10-CM

## 2014-02-22 DIAGNOSIS — C50912 Malignant neoplasm of unspecified site of left female breast: Secondary | ICD-10-CM

## 2014-02-22 DIAGNOSIS — C773 Secondary and unspecified malignant neoplasm of axilla and upper limb lymph nodes: Secondary | ICD-10-CM

## 2014-02-22 DIAGNOSIS — C7951 Secondary malignant neoplasm of bone: Secondary | ICD-10-CM

## 2014-02-22 DIAGNOSIS — Z5111 Encounter for antineoplastic chemotherapy: Secondary | ICD-10-CM

## 2014-02-22 MED ORDER — FULVESTRANT 250 MG/5ML IM SOLN
500.0000 mg | Freq: Once | INTRAMUSCULAR | Status: AC
  Administered 2014-02-22: 500 mg via INTRAMUSCULAR
  Filled 2014-02-22: qty 10

## 2014-03-04 ENCOUNTER — Other Ambulatory Visit: Payer: Self-pay | Admitting: *Deleted

## 2014-03-04 DIAGNOSIS — C50919 Malignant neoplasm of unspecified site of unspecified female breast: Secondary | ICD-10-CM

## 2014-03-04 DIAGNOSIS — C7951 Secondary malignant neoplasm of bone: Principal | ICD-10-CM

## 2014-03-04 DIAGNOSIS — C50912 Malignant neoplasm of unspecified site of left female breast: Secondary | ICD-10-CM

## 2014-03-04 MED ORDER — ALPRAZOLAM 0.5 MG PO TABS: 0.5000 mg | ORAL_TABLET | Freq: Three times a day (TID) | ORAL | Status: DC | PRN

## 2014-03-04 MED ORDER — HYDROCODONE-ACETAMINOPHEN 10-325 MG PO TABS: 1.0000 | ORAL_TABLET | ORAL | Status: DC | PRN

## 2014-03-04 NOTE — Telephone Encounter (Signed)
Notified pt per request that re-fills for Xanax and Norco are ready for p/u.  Pt verbalized understanding and states she will p/u this Wednesday.

## 2014-03-22 ENCOUNTER — Other Ambulatory Visit: Payer: Self-pay | Admitting: Nurse Practitioner

## 2014-03-22 ENCOUNTER — Ambulatory Visit (HOSPITAL_BASED_OUTPATIENT_CLINIC_OR_DEPARTMENT_OTHER): Payer: 59

## 2014-03-22 VITALS — BP 139/58 | HR 81 | Temp 98.4°F

## 2014-03-22 DIAGNOSIS — C50919 Malignant neoplasm of unspecified site of unspecified female breast: Secondary | ICD-10-CM

## 2014-03-22 DIAGNOSIS — C7951 Secondary malignant neoplasm of bone: Secondary | ICD-10-CM

## 2014-03-22 DIAGNOSIS — Z5111 Encounter for antineoplastic chemotherapy: Secondary | ICD-10-CM

## 2014-03-22 DIAGNOSIS — C7952 Secondary malignant neoplasm of bone marrow: Secondary | ICD-10-CM

## 2014-03-22 DIAGNOSIS — C773 Secondary and unspecified malignant neoplasm of axilla and upper limb lymph nodes: Secondary | ICD-10-CM

## 2014-03-22 DIAGNOSIS — C50912 Malignant neoplasm of unspecified site of left female breast: Secondary | ICD-10-CM

## 2014-03-22 MED ORDER — FULVESTRANT 250 MG/5ML IM SOLN
500.0000 mg | Freq: Once | INTRAMUSCULAR | Status: AC
  Administered 2014-03-22: 500 mg via INTRAMUSCULAR
  Filled 2014-03-22: qty 10

## 2014-03-24 ENCOUNTER — Other Ambulatory Visit: Payer: Self-pay | Admitting: Oncology

## 2014-03-24 DIAGNOSIS — C7951 Secondary malignant neoplasm of bone: Secondary | ICD-10-CM

## 2014-03-24 DIAGNOSIS — C7952 Secondary malignant neoplasm of bone marrow: Principal | ICD-10-CM

## 2014-04-03 ENCOUNTER — Other Ambulatory Visit: Payer: Self-pay | Admitting: *Deleted

## 2014-04-03 DIAGNOSIS — C50919 Malignant neoplasm of unspecified site of unspecified female breast: Secondary | ICD-10-CM

## 2014-04-03 DIAGNOSIS — C50912 Malignant neoplasm of unspecified site of left female breast: Secondary | ICD-10-CM

## 2014-04-03 DIAGNOSIS — C7951 Secondary malignant neoplasm of bone: Principal | ICD-10-CM

## 2014-04-03 MED ORDER — ALPRAZOLAM 0.5 MG PO TABS
0.5000 mg | ORAL_TABLET | Freq: Three times a day (TID) | ORAL | Status: DC | PRN
Start: 2014-04-03 — End: 2014-04-30

## 2014-04-03 MED ORDER — HYDROCODONE-ACETAMINOPHEN 10-325 MG PO TABS: 1.0000 | ORAL_TABLET | ORAL | Status: DC | PRN

## 2014-04-03 NOTE — Telephone Encounter (Signed)
Per request; notified pt that pain refill request--Xanax and Hydrocodone---are ready for p/u.  Pt verbalized understanding and expressed appreciation for call.

## 2014-04-18 ENCOUNTER — Telehealth: Payer: Self-pay | Admitting: Oncology

## 2014-04-18 ENCOUNTER — Other Ambulatory Visit (HOSPITAL_BASED_OUTPATIENT_CLINIC_OR_DEPARTMENT_OTHER): Payer: 59

## 2014-04-18 ENCOUNTER — Ambulatory Visit (HOSPITAL_BASED_OUTPATIENT_CLINIC_OR_DEPARTMENT_OTHER): Payer: 59 | Admitting: Oncology

## 2014-04-18 ENCOUNTER — Ambulatory Visit (HOSPITAL_BASED_OUTPATIENT_CLINIC_OR_DEPARTMENT_OTHER): Payer: 59

## 2014-04-18 VITALS — BP 152/75 | HR 90 | Temp 98.7°F | Resp 18 | Ht 63.0 in | Wt 212.4 lb

## 2014-04-18 DIAGNOSIS — M25552 Pain in left hip: Secondary | ICD-10-CM

## 2014-04-18 DIAGNOSIS — C7951 Secondary malignant neoplasm of bone: Secondary | ICD-10-CM

## 2014-04-18 DIAGNOSIS — C50919 Malignant neoplasm of unspecified site of unspecified female breast: Secondary | ICD-10-CM

## 2014-04-18 DIAGNOSIS — G893 Neoplasm related pain (acute) (chronic): Secondary | ICD-10-CM

## 2014-04-18 DIAGNOSIS — Z803 Family history of malignant neoplasm of breast: Secondary | ICD-10-CM

## 2014-04-18 DIAGNOSIS — C773 Secondary and unspecified malignant neoplasm of axilla and upper limb lymph nodes: Secondary | ICD-10-CM

## 2014-04-18 DIAGNOSIS — I10 Essential (primary) hypertension: Secondary | ICD-10-CM

## 2014-04-18 DIAGNOSIS — Z170 Estrogen receptor positive status [ER+]: Secondary | ICD-10-CM | POA: Diagnosis not present

## 2014-04-18 DIAGNOSIS — C50912 Malignant neoplasm of unspecified site of left female breast: Secondary | ICD-10-CM

## 2014-04-18 DIAGNOSIS — C7952 Secondary malignant neoplasm of bone marrow: Principal | ICD-10-CM

## 2014-04-18 DIAGNOSIS — Z5111 Encounter for antineoplastic chemotherapy: Secondary | ICD-10-CM

## 2014-04-18 DIAGNOSIS — M069 Rheumatoid arthritis, unspecified: Secondary | ICD-10-CM

## 2014-04-18 DIAGNOSIS — E780 Pure hypercholesterolemia: Secondary | ICD-10-CM

## 2014-04-18 DIAGNOSIS — E119 Type 2 diabetes mellitus without complications: Secondary | ICD-10-CM

## 2014-04-18 DIAGNOSIS — M25551 Pain in right hip: Secondary | ICD-10-CM

## 2014-04-18 DIAGNOSIS — M545 Low back pain: Secondary | ICD-10-CM

## 2014-04-18 LAB — COMPREHENSIVE METABOLIC PANEL (CC13)
ALBUMIN: 3.4 g/dL — AB (ref 3.5–5.0)
ALK PHOS: 157 U/L — AB (ref 40–150)
ALT: 16 U/L (ref 0–55)
AST: 20 U/L (ref 5–34)
Anion Gap: 14 mEq/L — ABNORMAL HIGH (ref 3–11)
BILIRUBIN TOTAL: 0.24 mg/dL (ref 0.20–1.20)
BUN: 26 mg/dL (ref 7.0–26.0)
CALCIUM: 10.4 mg/dL (ref 8.4–10.4)
CHLORIDE: 108 meq/L (ref 98–109)
CO2: 22 mEq/L (ref 22–29)
Creatinine: 1.1 mg/dL (ref 0.6–1.1)
EGFR: 56 mL/min/{1.73_m2} — AB (ref >90)
Glucose: 53 mg/dl — ABNORMAL LOW (ref 70–140)
POTASSIUM: 4.5 meq/L (ref 3.5–5.1)
SODIUM: 144 meq/L (ref 136–145)
TOTAL PROTEIN: 6.7 g/dL (ref 6.4–8.3)

## 2014-04-18 LAB — CBC WITH DIFFERENTIAL/PLATELET
BASO%: 0.3 % (ref 0.0–2.0)
BASOS ABS: 0 10*3/uL (ref 0.0–0.1)
EOS ABS: 0.2 10*3/uL (ref 0.0–0.5)
EOS%: 5.5 % (ref 0.0–7.0)
HEMATOCRIT: 34.6 % — AB (ref 34.8–46.6)
HEMOGLOBIN: 11.2 g/dL — AB (ref 11.6–15.9)
LYMPH#: 0.6 10*3/uL — AB (ref 0.9–3.3)
LYMPH%: 15.5 % (ref 14.0–49.7)
MCH: 30 pg (ref 25.1–34.0)
MCHC: 32.2 g/dL (ref 31.5–36.0)
MCV: 93.2 fL (ref 79.5–101.0)
MONO#: 0.6 10*3/uL (ref 0.1–0.9)
MONO%: 13.9 % (ref 0.0–14.0)
NEUT%: 64.8 % (ref 38.4–76.8)
NEUTROS ABS: 2.6 10*3/uL (ref 1.5–6.5)
Platelets: 152 10*3/uL (ref 145–400)
RBC: 3.72 10*6/uL (ref 3.70–5.45)
RDW: 14.3 % (ref 11.2–14.5)
WBC: 4 10*3/uL (ref 3.9–10.3)

## 2014-04-18 LAB — CANCER ANTIGEN 27.29: CA 27.29: 22 U/mL (ref 0–39)

## 2014-04-18 MED ORDER — FULVESTRANT 250 MG/5ML IM SOLN
500.0000 mg | Freq: Once | INTRAMUSCULAR | Status: AC
  Administered 2014-04-18: 500 mg via INTRAMUSCULAR
  Filled 2014-04-18: qty 10

## 2014-04-18 MED ORDER — DENOSUMAB 120 MG/1.7ML ~~LOC~~ SOLN
120.0000 mg | Freq: Once | SUBCUTANEOUS | Status: AC
  Administered 2014-04-18: 120 mg via SUBCUTANEOUS
  Filled 2014-04-18: qty 1.7

## 2014-04-18 NOTE — Telephone Encounter (Signed)
Pt confirmed labs/ov per 03/31 POF, gave pt AVS and Calendar.... KJ  °

## 2014-04-18 NOTE — Progress Notes (Signed)
  [facility] OFFICE PROGRESS NOTE   Diagnosis: Breast cancer  INTERVAL HISTORY:   [name] returns as scheduled. She continues monthly Faslodex. She has noted increased pain at the low back and bilateral hips for the past few days. She recently completed a prednisone taper for a flare of rheumatoid arthritis in the right hand.  Objective:  Vital signs in last 24 hours:  Blood pressure 152/75, pulse 90, temperature 98.7 F (37.1 C), temperature source Oral, resp. rate 18, height $RemoveBe'5\' 3"'ZQXOhRjSd$  (1.6 m), weight 212 lb 6.4 oz (96.344 kg), SpO2 100 %.    HEENT: Neck without mass Lymphatics: No cervical, supra-clavicular, or axillary nodes Resp: Lungs clear bilaterally Cardio: Regular rate and rhythm GI: No hepatomegaly Vascular: No leg edema Breasts: Has post left mastectomy, no evidence for chest wall tumor recurrence  Musculoskeletal: No spine tenderness, no pain with motion at the hips, minimal tenderness over the iliacs     Lab Results:  Lab Results  Component Value Date   WBC 4.0 04/18/2014   HGB 11.2* 04/18/2014   HCT 34.6* 04/18/2014   MCV 93.2 04/18/2014   PLT 152 04/18/2014   NEUTROABS 2.6 04/18/2014   Creatinine 1.1, calcium 10.4, alk phosphatase 157  Medications: I have reviewed the patient's current medications.  Assessment/Plan: 1. 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 ER positive, PR positive, HER-2 negative She completed 4 cycles of adjuvant AC chemotherapy June 15 through September 03, 2008, and then completed the left chest wall radiation. She began Arimidex following an office visit on November 11, 2008.  Bone scan 06/01/2013 suggestive of thoracic spine metastases, thoracic MRI 06/27/2011 consistent with multiple bone metastases involving the thoracolumbar spine   PET scan 07/09/2013 with multiple hypermetabolic  bone lesions and hypermetabolic mediastinal nodes.   Left iliac lesion biopsy 07/26/2013. Pathology showed metastatic carcinoma, ER positive, PR positive, HER-2 negative   Initiation of Faslodex 08/10/2013.  Xgeva every 3 months initiated 07/27/2013.  Restaging PET scan 01/23/2014 with no residual hypermetabolic mediastinal disease; marked improvement in the metastatic bone disease. 2. Left chest subcutaneous mass noted on exam October 04, 2008, status post biopsy by Dr. Brantley Stage with a benign pathology. 3. History of delayed healing of the left mastectomy incision. 4. Rheumatoid arthritis. 5. Diabetes. 6. Hypercholesterolemia. 7. Hypertension. 8. Family history of breast cancer. She has been evaluated at the genetic screening clinic. 9. 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  Disposition:  Her overall status appears unchanged. She will continue monthly Faslodex. She will receive xgeva today. The low back and hip pain may be related to a benign musculoskeletal condition, rheumatoid arthritis, or less likely breast cancer. She will contact us if the pain does not improve. [name] will return for an office visit in 3 months. We will follow-up on the CA-27-29 from today.  Betsy Coder, MD  04/18/2014  9:11 AM

## 2014-04-19 ENCOUNTER — Ambulatory Visit: Payer: 59

## 2014-04-19 ENCOUNTER — Other Ambulatory Visit: Payer: 59

## 2014-04-19 ENCOUNTER — Ambulatory Visit: Payer: 59 | Admitting: Oncology

## 2014-04-22 ENCOUNTER — Telehealth: Payer: Self-pay | Admitting: *Deleted

## 2014-04-22 NOTE — Telephone Encounter (Signed)
Called pt with normal tumor marker results. She voiced understanding.

## 2014-04-22 NOTE — Telephone Encounter (Signed)
-----   Message from Ladell Pier, MD sent at 04/20/2014  7:31 PM EDT ----- Please call patient, ca27.29 is normal

## 2014-04-30 ENCOUNTER — Telehealth: Payer: Self-pay | Admitting: *Deleted

## 2014-04-30 ENCOUNTER — Other Ambulatory Visit: Payer: Self-pay | Admitting: *Deleted

## 2014-04-30 DIAGNOSIS — C7951 Secondary malignant neoplasm of bone: Principal | ICD-10-CM

## 2014-04-30 DIAGNOSIS — C50912 Malignant neoplasm of unspecified site of left female breast: Secondary | ICD-10-CM

## 2014-04-30 DIAGNOSIS — C50919 Malignant neoplasm of unspecified site of unspecified female breast: Secondary | ICD-10-CM

## 2014-04-30 MED ORDER — HYDROCODONE-ACETAMINOPHEN 10-325 MG PO TABS: 1.0000 | ORAL_TABLET | ORAL | Status: DC | PRN

## 2014-04-30 MED ORDER — ALPRAZOLAM 0.5 MG PO TABS: 0.5000 mg | ORAL_TABLET | Freq: Three times a day (TID) | ORAL | Status: DC | PRN

## 2014-04-30 NOTE — Telephone Encounter (Signed)
TCF patient asking for refills on Hydrocodone/APAP and Xanax. Prescriptions written; signed by [name], PA for Dr. Benay Spice. Pt. Notified that they would be available for pick up tomorrow 05/01/14

## 2014-04-30 NOTE — Telephone Encounter (Signed)
TCF from patient requesting refill on Hydrocodone/APAP and Xanax. Prescriptions printed out and to be signed by [name], PA. Pt. Made aware that prescriptions would be available for pick up on 05/01/14.

## 2014-05-01 ENCOUNTER — Other Ambulatory Visit: Payer: Self-pay | Admitting: *Deleted

## 2014-05-01 DIAGNOSIS — C7951 Secondary malignant neoplasm of bone: Principal | ICD-10-CM

## 2014-05-01 DIAGNOSIS — C50919 Malignant neoplasm of unspecified site of unspecified female breast: Secondary | ICD-10-CM

## 2014-05-01 DIAGNOSIS — C50912 Malignant neoplasm of unspecified site of left female breast: Secondary | ICD-10-CM

## 2014-05-01 MED ORDER — ALPRAZOLAM 0.5 MG PO TABS: 0.5000 mg | ORAL_TABLET | Freq: Three times a day (TID) | ORAL | Status: DC | PRN

## 2014-05-01 MED ORDER — HYDROCODONE-ACETAMINOPHEN 10-325 MG PO TABS: 1.0000 | ORAL_TABLET | ORAL | Status: DC | PRN

## 2014-05-16 ENCOUNTER — Ambulatory Visit (HOSPITAL_BASED_OUTPATIENT_CLINIC_OR_DEPARTMENT_OTHER): Payer: 59

## 2014-05-16 VITALS — BP 123/61 | HR 84 | Temp 98.4°F

## 2014-05-16 DIAGNOSIS — C7952 Secondary malignant neoplasm of bone marrow: Principal | ICD-10-CM

## 2014-05-16 DIAGNOSIS — C7951 Secondary malignant neoplasm of bone: Secondary | ICD-10-CM

## 2014-05-16 DIAGNOSIS — C773 Secondary and unspecified malignant neoplasm of axilla and upper limb lymph nodes: Secondary | ICD-10-CM

## 2014-05-16 DIAGNOSIS — C50912 Malignant neoplasm of unspecified site of left female breast: Secondary | ICD-10-CM

## 2014-05-16 DIAGNOSIS — Z5111 Encounter for antineoplastic chemotherapy: Secondary | ICD-10-CM | POA: Diagnosis not present

## 2014-05-16 DIAGNOSIS — C50919 Malignant neoplasm of unspecified site of unspecified female breast: Secondary | ICD-10-CM

## 2014-05-16 MED ORDER — FULVESTRANT 250 MG/5ML IM SOLN
500.0000 mg | Freq: Once | INTRAMUSCULAR | Status: AC
  Administered 2014-05-16: 500 mg via INTRAMUSCULAR
  Filled 2014-05-16: qty 10

## 2014-05-29 ENCOUNTER — Other Ambulatory Visit: Payer: Self-pay | Admitting: *Deleted

## 2014-05-29 DIAGNOSIS — C50912 Malignant neoplasm of unspecified site of left female breast: Secondary | ICD-10-CM

## 2014-05-29 DIAGNOSIS — C7951 Secondary malignant neoplasm of bone: Secondary | ICD-10-CM

## 2014-05-29 DIAGNOSIS — C50919 Malignant neoplasm of unspecified site of unspecified female breast: Secondary | ICD-10-CM

## 2014-05-29 MED ORDER — ALPRAZOLAM 0.5 MG PO TABS: 0.5000 mg | ORAL_TABLET | Freq: Three times a day (TID) | ORAL | Status: DC | PRN

## 2014-05-29 MED ORDER — HYDROCODONE-ACETAMINOPHEN 10-325 MG PO TABS: 1.0000 | ORAL_TABLET | ORAL | Status: DC | PRN

## 2014-05-29 NOTE — Telephone Encounter (Signed)
Refill: Hydrocodone/Xanax. Patient notified and verbalized understanding.

## 2014-06-13 ENCOUNTER — Ambulatory Visit (HOSPITAL_BASED_OUTPATIENT_CLINIC_OR_DEPARTMENT_OTHER): Payer: 59

## 2014-06-13 VITALS — BP 134/65 | HR 88 | Temp 98.6°F

## 2014-06-13 DIAGNOSIS — C50912 Malignant neoplasm of unspecified site of left female breast: Secondary | ICD-10-CM | POA: Diagnosis not present

## 2014-06-13 DIAGNOSIS — C7951 Secondary malignant neoplasm of bone: Secondary | ICD-10-CM | POA: Diagnosis not present

## 2014-06-13 DIAGNOSIS — C773 Secondary and unspecified malignant neoplasm of axilla and upper limb lymph nodes: Secondary | ICD-10-CM | POA: Diagnosis not present

## 2014-06-13 DIAGNOSIS — C7952 Secondary malignant neoplasm of bone marrow: Principal | ICD-10-CM

## 2014-06-13 DIAGNOSIS — C50919 Malignant neoplasm of unspecified site of unspecified female breast: Secondary | ICD-10-CM

## 2014-06-13 MED ORDER — FULVESTRANT 250 MG/5ML IM SOLN
500.0000 mg | Freq: Once | INTRAMUSCULAR | Status: AC
  Administered 2014-06-13: 500 mg via INTRAMUSCULAR
  Filled 2014-06-13: qty 10

## 2014-06-25 ENCOUNTER — Telehealth: Payer: Self-pay | Admitting: *Deleted

## 2014-06-25 ENCOUNTER — Other Ambulatory Visit: Payer: Self-pay | Admitting: *Deleted

## 2014-06-25 DIAGNOSIS — C50912 Malignant neoplasm of unspecified site of left female breast: Secondary | ICD-10-CM

## 2014-06-25 DIAGNOSIS — C50919 Malignant neoplasm of unspecified site of unspecified female breast: Secondary | ICD-10-CM

## 2014-06-25 DIAGNOSIS — C7951 Secondary malignant neoplasm of bone: Principal | ICD-10-CM

## 2014-06-25 MED ORDER — HYDROCODONE-ACETAMINOPHEN 10-325 MG PO TABS: 1.0000 | ORAL_TABLET | ORAL | Status: DC | PRN

## 2014-06-25 MED ORDER — ALPRAZOLAM 0.5 MG PO TABS: 0.5000 mg | ORAL_TABLET | Freq: Three times a day (TID) | ORAL | Status: DC | PRN

## 2014-06-25 NOTE — Telephone Encounter (Signed)
PT. WOULD LIKE TO PICK UP PRESCRIPTIONS TODAY. CALL PT. WHEN PRESCRIPTIONS ARE READY AT [facility] PHONE.

## 2014-06-25 NOTE — Telephone Encounter (Signed)
Notified pt that re-fill scripts for Xanax and Norco are ready for pick-up.  Pt verbalized understanding

## 2014-07-18 ENCOUNTER — Telehealth: Payer: Self-pay | Admitting: Nurse Practitioner

## 2014-07-18 ENCOUNTER — Ambulatory Visit (HOSPITAL_BASED_OUTPATIENT_CLINIC_OR_DEPARTMENT_OTHER): Payer: 59

## 2014-07-18 ENCOUNTER — Other Ambulatory Visit (HOSPITAL_BASED_OUTPATIENT_CLINIC_OR_DEPARTMENT_OTHER): Payer: 59

## 2014-07-18 ENCOUNTER — Ambulatory Visit (HOSPITAL_BASED_OUTPATIENT_CLINIC_OR_DEPARTMENT_OTHER): Payer: 59 | Admitting: Nurse Practitioner

## 2014-07-18 VITALS — BP 133/62 | HR 90 | Temp 97.9°F | Resp 18 | Ht 63.0 in | Wt 211.8 lb

## 2014-07-18 DIAGNOSIS — C7951 Secondary malignant neoplasm of bone: Secondary | ICD-10-CM

## 2014-07-18 DIAGNOSIS — C50919 Malignant neoplasm of unspecified site of unspecified female breast: Secondary | ICD-10-CM

## 2014-07-18 DIAGNOSIS — C50912 Malignant neoplasm of unspecified site of left female breast: Secondary | ICD-10-CM

## 2014-07-18 DIAGNOSIS — Z5111 Encounter for antineoplastic chemotherapy: Secondary | ICD-10-CM | POA: Diagnosis not present

## 2014-07-18 DIAGNOSIS — C7952 Secondary malignant neoplasm of bone marrow: Principal | ICD-10-CM

## 2014-07-18 LAB — COMPREHENSIVE METABOLIC PANEL (CC13)
ALBUMIN: 3.5 g/dL (ref 3.5–5.0)
ALT: 18 U/L (ref 0–55)
AST: 21 U/L (ref 5–34)
Alkaline Phosphatase: 149 U/L (ref 40–150)
Anion Gap: 10 mEq/L (ref 3–11)
BUN: 44.3 mg/dL — AB (ref 7.0–26.0)
CO2: 23 mEq/L (ref 22–29)
Calcium: 9.3 mg/dL (ref 8.4–10.4)
Chloride: 107 mEq/L (ref 98–109)
Creatinine: 1.4 mg/dL — ABNORMAL HIGH (ref 0.6–1.1)
EGFR: 41 mL/min/{1.73_m2} — ABNORMAL LOW (ref >90)
Glucose: 102 mg/dl (ref 70–140)
Potassium: 4.6 mEq/L (ref 3.5–5.1)
Sodium: 140 mEq/L (ref 136–145)
Total Bilirubin: 0.29 mg/dL (ref 0.20–1.20)
Total Protein: 6.7 g/dL (ref 6.4–8.3)

## 2014-07-18 LAB — CBC WITH DIFFERENTIAL/PLATELET
BASO%: 0 % (ref 0.0–2.0)
Basophils Absolute: 0 10*3/uL (ref 0.0–0.1)
EOS%: 6.3 % (ref 0.0–7.0)
Eosinophils Absolute: 0.3 10*3/uL (ref 0.0–0.5)
HCT: 35.9 % (ref 34.8–46.6)
HGB: 11.4 g/dL — ABNORMAL LOW (ref 11.6–15.9)
LYMPH%: 13.2 % — ABNORMAL LOW (ref 14.0–49.7)
MCH: 30 pg (ref 25.1–34.0)
MCHC: 31.8 g/dL (ref 31.5–36.0)
MCV: 94.5 fL (ref 79.5–101.0)
MONO#: 0.6 10*3/uL (ref 0.1–0.9)
MONO%: 12.2 % (ref 0.0–14.0)
NEUT#: 3.3 10*3/uL (ref 1.5–6.5)
NEUT%: 68.3 % (ref 38.4–76.8)
PLATELETS: 140 10*3/uL — AB (ref 145–400)
RBC: 3.8 10*6/uL (ref 3.70–5.45)
RDW: 14.1 % (ref 11.2–14.5)
WBC: 4.8 10*3/uL (ref 3.9–10.3)
lymph#: 0.6 10*3/uL — ABNORMAL LOW (ref 0.9–3.3)

## 2014-07-18 LAB — CANCER ANTIGEN 27.29: CA 27.29: 26 U/mL (ref 0–39)

## 2014-07-18 MED ORDER — HYDROCODONE-ACETAMINOPHEN 10-325 MG PO TABS: 1.0000 | ORAL_TABLET | ORAL | Status: DC | PRN

## 2014-07-18 MED ORDER — PROCHLORPERAZINE MALEATE 10 MG PO TABS: 10.0000 mg | ORAL_TABLET | Freq: Four times a day (QID) | ORAL | Status: DC | PRN

## 2014-07-18 MED ORDER — FULVESTRANT 250 MG/5ML IM SOLN
500.0000 mg | Freq: Once | INTRAMUSCULAR | Status: AC
  Administered 2014-07-18: 500 mg via INTRAMUSCULAR
  Filled 2014-07-18: qty 10

## 2014-07-18 MED ORDER — DENOSUMAB 120 MG/1.7ML ~~LOC~~ SOLN
120.0000 mg | Freq: Once | SUBCUTANEOUS | Status: AC
  Administered 2014-07-18: 120 mg via SUBCUTANEOUS
  Filled 2014-07-18: qty 1.7

## 2014-07-18 NOTE — Progress Notes (Signed)
[facility] OFFICE PROGRESS NOTE   Diagnosis:  Breast cancer  INTERVAL HISTORY:   [name] returns as scheduled. She continues monthly Faslodex. She receives [place] every 3 months. This week she has noted increased left hip and back pain. She takes hydrocodone as needed. She occasionally notes that her toes "tingle" at the end of the day. No leg weakness or numbness. No bowel or bladder dysfunction. She has occasional mild nausea. Overall good appetite. No urinary symptoms. She has difficulty sleeping. She plans on increasing the Benadryl dose from 25 mg to 50 mg at bedtime. She had a recent flare of rheumatoid arthritis in the right hand. She is completing a prednisone taper.  Objective:  Vital signs in last 24 hours:  Blood pressure 133/62, pulse 90, temperature 97.9 F (36.6 C), temperature source Oral, resp. rate 18, height '5\' 3"'  (1.6 m), weight 211 lb 12.8 oz (96.072 kg), SpO2 100 %.    HEENT: No thrush or ulcers. Lymphatics: No palpable cervical, supraclavicular or axillary lymph nodes. Resp: Lungs clear bilaterally. Cardio: Regular rate and rhythm. GI: Abdomen soft and nontender. No hepatomegaly. Vascular: No leg edema. Neuro: Lower extremity motor strength 5 over 5.  Breasts: Status post left mastectomy. No evidence for chest wall tumor recurrence.    Lab Results:  Lab Results  Component Value Date   WBC 4.8 07/18/2014   HGB 11.4* 07/18/2014   HCT 35.9 07/18/2014   MCV 94.5 07/18/2014   PLT 140* 07/18/2014   NEUTROABS 3.3 07/18/2014    Imaging:  No results found.  Medications: I have reviewed the patient's current medications.  Assessment/Plan: 1. 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 ER positive, PR positive, HER-2 negative She completed 4 cycles of adjuvant AC chemotherapy June 15 through September 03, 2008,  and then completed the left chest wall radiation. She began Arimidex following an office visit on November 11, 2008.  Bone scan 06/01/2013 suggestive of thoracic spine metastases, thoracic MRI 06/27/2011 consistent with multiple bone metastases involving the thoracolumbar spine   PET scan 07/09/2013 with multiple hypermetabolic bone lesions and hypermetabolic mediastinal nodes.   Left iliac lesion biopsy 07/26/2013. Pathology showed metastatic carcinoma, ER positive, PR positive, HER-2 negative   Initiation of Faslodex 08/10/2013.  Xgeva every 3 months initiated 07/27/2013.  Restaging PET scan 01/23/2014 with no residual hypermetabolic mediastinal disease; marked improvement in the metastatic bone disease. 2. Left chest subcutaneous mass noted on exam October 04, 2008, status post biopsy by Dr. Brantley Stage with a benign pathology. 3. History of delayed healing of the left mastectomy incision. 4. Rheumatoid arthritis. 5. Diabetes. 6. Hypercholesterolemia. 7. Hypertension. 8. Family history of breast cancer. She has been evaluated at the genetic screening clinic. 9. 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   Disposition: [name] appears unchanged. The plan is to continue monthly Faslodex and every three-month Xgeva. She understands to contact the office if the low back and hip pain worsen or she develops any new symptoms such as leg weakness or numbness, or bowel/bladder dysfunction. We will follow-up on the CA-27-29 from today. She will return for a follow-up visit in 3 months.  Plan reviewed with Dr. Benay Spice.    [name] ANP/GNP-BC   07/18/2014  10:20 AM

## 2014-07-18 NOTE — Telephone Encounter (Signed)
Pt confirmed labs/ov/inj per 06/30 POF, gave pt AVS and Calendar... KJ

## 2014-07-19 ENCOUNTER — Telehealth: Payer: Self-pay | Admitting: *Deleted

## 2014-07-19 NOTE — Telephone Encounter (Signed)
Per Dr. Benay Spice; notified pt that ca27.29 is normal.  Pt verbalized understanding and expressed appreciation "that makes my weekend"

## 2014-07-19 NOTE — Telephone Encounter (Signed)
-----   Message from Ladell Pier, MD sent at 07/18/2014  9:19 PM EDT ----- Please call patient, ca27.29 is normal

## 2014-08-05 ENCOUNTER — Encounter: Payer: Self-pay | Admitting: Genetic Counselor

## 2014-08-13 ENCOUNTER — Telehealth: Payer: Self-pay | Admitting: *Deleted

## 2014-08-13 ENCOUNTER — Other Ambulatory Visit: Payer: Self-pay | Admitting: *Deleted

## 2014-08-13 DIAGNOSIS — C50919 Malignant neoplasm of unspecified site of unspecified female breast: Secondary | ICD-10-CM

## 2014-08-13 DIAGNOSIS — C7951 Secondary malignant neoplasm of bone: Secondary | ICD-10-CM

## 2014-08-13 DIAGNOSIS — C50912 Malignant neoplasm of unspecified site of left female breast: Secondary | ICD-10-CM

## 2014-08-13 MED ORDER — HYDROCODONE-ACETAMINOPHEN 10-325 MG PO TABS: 1.0000 | ORAL_TABLET | ORAL | Status: DC | PRN

## 2014-08-13 MED ORDER — ALPRAZOLAM 0.5 MG PO TABS: 0.5000 mg | ORAL_TABLET | Freq: Three times a day (TID) | ORAL | Status: DC | PRN

## 2014-08-13 NOTE — Telephone Encounter (Signed)
"  I am coming in on Thursday for Treatment.  I would like to pick up hydrocodone and valium scripts at this time."

## 2014-08-13 NOTE — Telephone Encounter (Signed)
Refill: Norco/Xanax

## 2014-08-15 ENCOUNTER — Ambulatory Visit (HOSPITAL_BASED_OUTPATIENT_CLINIC_OR_DEPARTMENT_OTHER): Payer: 59

## 2014-08-15 VITALS — BP 129/66 | HR 90 | Temp 98.0°F

## 2014-08-15 DIAGNOSIS — C50912 Malignant neoplasm of unspecified site of left female breast: Secondary | ICD-10-CM

## 2014-08-15 DIAGNOSIS — C7951 Secondary malignant neoplasm of bone: Secondary | ICD-10-CM | POA: Diagnosis not present

## 2014-08-15 DIAGNOSIS — Z5111 Encounter for antineoplastic chemotherapy: Secondary | ICD-10-CM | POA: Diagnosis not present

## 2014-08-15 DIAGNOSIS — C773 Secondary and unspecified malignant neoplasm of axilla and upper limb lymph nodes: Secondary | ICD-10-CM

## 2014-08-15 DIAGNOSIS — C50919 Malignant neoplasm of unspecified site of unspecified female breast: Secondary | ICD-10-CM

## 2014-08-15 DIAGNOSIS — C7952 Secondary malignant neoplasm of bone marrow: Principal | ICD-10-CM

## 2014-08-15 MED ORDER — FULVESTRANT 250 MG/5ML IM SOLN
500.0000 mg | Freq: Once | INTRAMUSCULAR | Status: AC
  Administered 2014-08-15: 500 mg via INTRAMUSCULAR
  Filled 2014-08-15: qty 10

## 2014-09-10 ENCOUNTER — Telehealth: Payer: Self-pay | Admitting: *Deleted

## 2014-09-10 NOTE — Telephone Encounter (Signed)
PT. WOULD LIKE TO PICK UP THESE PRESCRIPTIONS ON 09/12/14.

## 2014-09-11 NOTE — Telephone Encounter (Signed)
Voicemail from patient reporting she called yesterday for one refill but needs two.  I want to pick up prescription for my Xanaxc and the Hydrocodone when I come in tomorrow for my injection."  Injection appointment is 09-12-2014 at 0945.

## 2014-09-11 NOTE — Telephone Encounter (Signed)
ok 

## 2014-09-12 ENCOUNTER — Other Ambulatory Visit: Payer: Self-pay | Admitting: *Deleted

## 2014-09-12 ENCOUNTER — Ambulatory Visit (HOSPITAL_BASED_OUTPATIENT_CLINIC_OR_DEPARTMENT_OTHER): Payer: 59

## 2014-09-12 ENCOUNTER — Other Ambulatory Visit: Payer: 59

## 2014-09-12 VITALS — BP 154/64 | HR 85 | Temp 98.7°F

## 2014-09-12 DIAGNOSIS — C7951 Secondary malignant neoplasm of bone: Secondary | ICD-10-CM | POA: Diagnosis not present

## 2014-09-12 DIAGNOSIS — Z5111 Encounter for antineoplastic chemotherapy: Secondary | ICD-10-CM | POA: Diagnosis not present

## 2014-09-12 DIAGNOSIS — C50912 Malignant neoplasm of unspecified site of left female breast: Secondary | ICD-10-CM

## 2014-09-12 DIAGNOSIS — C50919 Malignant neoplasm of unspecified site of unspecified female breast: Secondary | ICD-10-CM

## 2014-09-12 DIAGNOSIS — C7952 Secondary malignant neoplasm of bone marrow: Principal | ICD-10-CM

## 2014-09-12 MED ORDER — FULVESTRANT 250 MG/5ML IM SOLN
500.0000 mg | Freq: Once | INTRAMUSCULAR | Status: AC
  Administered 2014-09-12: 500 mg via INTRAMUSCULAR
  Filled 2014-09-12: qty 10

## 2014-09-12 MED ORDER — HYDROCODONE-ACETAMINOPHEN 10-325 MG PO TABS: 1.0000 | ORAL_TABLET | ORAL | Status: DC | PRN

## 2014-09-12 MED ORDER — ALPRAZOLAM 0.5 MG PO TABS: 0.5000 mg | ORAL_TABLET | Freq: Three times a day (TID) | ORAL | Status: DC | PRN

## 2014-09-12 NOTE — Telephone Encounter (Signed)
Scripts prepared 09-12-2014.

## 2014-10-10 ENCOUNTER — Other Ambulatory Visit: Payer: Self-pay | Admitting: Nurse Practitioner

## 2014-10-10 DIAGNOSIS — C50919 Malignant neoplasm of unspecified site of unspecified female breast: Secondary | ICD-10-CM

## 2014-10-11 ENCOUNTER — Encounter: Payer: Self-pay | Admitting: Oncology

## 2014-10-11 ENCOUNTER — Ambulatory Visit (HOSPITAL_BASED_OUTPATIENT_CLINIC_OR_DEPARTMENT_OTHER): Payer: 59 | Admitting: Oncology

## 2014-10-11 ENCOUNTER — Other Ambulatory Visit (HOSPITAL_BASED_OUTPATIENT_CLINIC_OR_DEPARTMENT_OTHER): Payer: 59

## 2014-10-11 ENCOUNTER — Telehealth: Payer: Self-pay | Admitting: Oncology

## 2014-10-11 ENCOUNTER — Ambulatory Visit (HOSPITAL_BASED_OUTPATIENT_CLINIC_OR_DEPARTMENT_OTHER): Payer: 59

## 2014-10-11 VITALS — BP 124/55 | HR 86 | Temp 98.0°F | Resp 18 | Ht 63.0 in | Wt 213.8 lb

## 2014-10-11 DIAGNOSIS — C50919 Malignant neoplasm of unspecified site of unspecified female breast: Secondary | ICD-10-CM | POA: Diagnosis not present

## 2014-10-11 DIAGNOSIS — C7951 Secondary malignant neoplasm of bone: Secondary | ICD-10-CM

## 2014-10-11 DIAGNOSIS — C50912 Malignant neoplasm of unspecified site of left female breast: Secondary | ICD-10-CM | POA: Diagnosis not present

## 2014-10-11 DIAGNOSIS — K121 Other forms of stomatitis: Secondary | ICD-10-CM | POA: Diagnosis not present

## 2014-10-11 DIAGNOSIS — C773 Secondary and unspecified malignant neoplasm of axilla and upper limb lymph nodes: Secondary | ICD-10-CM

## 2014-10-11 DIAGNOSIS — Z5111 Encounter for antineoplastic chemotherapy: Secondary | ICD-10-CM | POA: Diagnosis not present

## 2014-10-11 DIAGNOSIS — C7952 Secondary malignant neoplasm of bone marrow: Principal | ICD-10-CM

## 2014-10-11 DIAGNOSIS — Z23 Encounter for immunization: Secondary | ICD-10-CM

## 2014-10-11 LAB — COMPREHENSIVE METABOLIC PANEL (CC13)
ALT: 14 U/L (ref 0–55)
ANION GAP: 11 meq/L (ref 3–11)
AST: 22 U/L (ref 5–34)
Albumin: 3.7 g/dL (ref 3.5–5.0)
Alkaline Phosphatase: 155 U/L — ABNORMAL HIGH (ref 40–150)
BUN: 31.6 mg/dL — ABNORMAL HIGH (ref 7.0–26.0)
CALCIUM: 10 mg/dL (ref 8.4–10.4)
CHLORIDE: 110 meq/L — AB (ref 98–109)
CO2: 22 meq/L (ref 22–29)
Creatinine: 1.2 mg/dL — ABNORMAL HIGH (ref 0.6–1.1)
EGFR: 50 mL/min/{1.73_m2} — AB (ref >90)
Glucose: 54 mg/dl — ABNORMAL LOW (ref 70–140)
POTASSIUM: 4.3 meq/L (ref 3.5–5.1)
Sodium: 143 mEq/L (ref 136–145)
Total Bilirubin: <0.3 mg/dL (ref 0.20–1.20)
Total Protein: 6.9 g/dL (ref 6.4–8.3)

## 2014-10-11 MED ORDER — ALPRAZOLAM 0.5 MG PO TABS: 0.5000 mg | ORAL_TABLET | Freq: Three times a day (TID) | ORAL | Status: DC | PRN

## 2014-10-11 MED ORDER — HYDROCODONE-ACETAMINOPHEN 10-325 MG PO TABS: 1.0000 | ORAL_TABLET | ORAL | Status: DC | PRN

## 2014-10-11 MED ORDER — MAGIC MOUTHWASH: 5.0000 mL | Freq: Four times a day (QID) | ORAL | Status: DC | PRN

## 2014-10-11 MED ORDER — DENOSUMAB 120 MG/1.7ML ~~LOC~~ SOLN
120.0000 mg | Freq: Once | SUBCUTANEOUS | Status: AC
  Administered 2014-10-11: 120 mg via SUBCUTANEOUS
  Filled 2014-10-11: qty 1.7

## 2014-10-11 MED ORDER — INFLUENZA VAC SPLIT QUAD 0.5 ML IM SUSY
0.5000 mL | PREFILLED_SYRINGE | Freq: Once | INTRAMUSCULAR | Status: AC
  Administered 2014-10-11: 0.5 mL via INTRAMUSCULAR
  Filled 2014-10-11: qty 0.5

## 2014-10-11 MED ORDER — FULVESTRANT 250 MG/5ML IM SOLN
500.0000 mg | Freq: Once | INTRAMUSCULAR | Status: AC
  Administered 2014-10-11: 500 mg via INTRAMUSCULAR
  Filled 2014-10-11: qty 10

## 2014-10-11 NOTE — Telephone Encounter (Signed)
per pof to sch pt appt-gave pt copy of avs °

## 2014-10-11 NOTE — Progress Notes (Signed)
  [facility] OFFICE PROGRESS NOTE   Diagnosis: Breast cancer  INTERVAL HISTORY:   [name] returns as scheduled. She continues monthly Faslodex. She reports stable pain at multiple bone sites. She is exercising several days per week.  Objective:  Vital signs in last 24 hours:  Blood pressure 124/55, pulse 86, temperature 98 F (36.7 C), temperature source Oral, resp. rate 18, height $RemoveBe'5\' 3"'gZBIOKqXM$  (1.6 m), weight 213 lb 12.8 oz (96.979 kg), SpO2 99 %.    HEENT: Neck without mass Lymphatics: No cervical, supraclavicular, or axillar nodes Resp: Lungs clear bilaterally Cardio: Regular rate and rhythm GI: No hepatomegaly Vascular: No leg edema Breasts: Status post left mastectomy. No evidence for chest wall tumor recurrence.    Lab Results:  Lab Results  Component Value Date   WBC 4.8 07/18/2014   HGB 11.4* 07/18/2014   HCT 35.9 07/18/2014   MCV 94.5 07/18/2014   PLT 140* 07/18/2014   NEUTROABS 3.3 07/18/2014   CA-27-29 on 07/18/2014: 26  Medications: I have reviewed the patient's current medications.  Assessment/Plan: 1. 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 ER positive, PR positive, HER-2 negative She completed 4 cycles of adjuvant AC chemotherapy June 15 through September 03, 2008, and then completed the left chest wall radiation. She began Arimidex following an office visit on November 11, 2008.  Bone scan 06/01/2013 suggestive of thoracic spine metastases, thoracic MRI 06/27/2011 consistent with multiple bone metastases involving the thoracolumbar spine   PET scan 07/09/2013 with multiple hypermetabolic bone lesions and hypermetabolic mediastinal nodes.   Left iliac lesion biopsy 07/26/2013. Pathology showed metastatic carcinoma, ER positive, PR positive, HER-2 negative   Initiation of Faslodex 08/10/2013.  Xgeva every 3  months initiated 07/27/2013.  Restaging PET scan 01/23/2014 with no residual hypermetabolic mediastinal disease; marked improvement in the metastatic bone disease. 2. Left chest subcutaneous mass noted on exam October 04, 2008, status post biopsy by Dr. Brantley Stage with a benign pathology. 3. History of delayed healing of the left mastectomy incision. 4. Rheumatoid arthritis. 5. Diabetes. 6. Hypercholesterolemia. 7. Hypertension. 8. Family history of breast cancer. She has been evaluated at the genetic screening clinic. 9. 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 10.    Disposition:  [name] appears stable. The plan is to continue monthly Faslodex and every three-month Xgeva. She will return for an office visit in 3 months. She received an influenza vaccine today. There is no clinical evidence of progressive breast cancer.  Betsy Coder, MD  10/11/2014  8:40 AM

## 2014-10-12 LAB — CANCER ANTIGEN 27.29: CA 27.29: 25 U/mL (ref 0–39)

## 2014-10-15 ENCOUNTER — Telehealth: Payer: Self-pay | Admitting: *Deleted

## 2014-10-15 NOTE — Telephone Encounter (Signed)
Pt made aware of results and appreciated call. No other needs identified at this time.

## 2014-10-15 NOTE — Telephone Encounter (Signed)
-----   Message from Ladell Pier, MD sent at 10/14/2014  5:15 PM EDT ----- Please call patient, ca27.29 is normal

## 2014-11-06 ENCOUNTER — Other Ambulatory Visit: Payer: Self-pay | Admitting: *Deleted

## 2014-11-06 DIAGNOSIS — C50919 Malignant neoplasm of unspecified site of unspecified female breast: Secondary | ICD-10-CM

## 2014-11-06 DIAGNOSIS — C7951 Secondary malignant neoplasm of bone: Secondary | ICD-10-CM

## 2014-11-06 DIAGNOSIS — K121 Other forms of stomatitis: Secondary | ICD-10-CM

## 2014-11-06 MED ORDER — ALPRAZOLAM 0.5 MG PO TABS: 0.5000 mg | ORAL_TABLET | Freq: Three times a day (TID) | ORAL | Status: DC | PRN

## 2014-11-06 MED ORDER — HYDROCODONE-ACETAMINOPHEN 10-325 MG PO TABS: 1.0000 | ORAL_TABLET | ORAL | Status: DC | PRN

## 2014-11-06 NOTE — Telephone Encounter (Signed)
Message from pt requesting to pick up Xanax and Hydrocodone Rx on 10/21 while here for injections. Rx will be left in prescription book for pick up.

## 2014-11-08 ENCOUNTER — Ambulatory Visit (HOSPITAL_BASED_OUTPATIENT_CLINIC_OR_DEPARTMENT_OTHER): Payer: 59

## 2014-11-08 ENCOUNTER — Telehealth: Payer: Self-pay | Admitting: Nurse Practitioner

## 2014-11-08 VITALS — BP 139/77 | HR 89 | Temp 98.0°F | Resp 20

## 2014-11-08 DIAGNOSIS — C50912 Malignant neoplasm of unspecified site of left female breast: Secondary | ICD-10-CM

## 2014-11-08 DIAGNOSIS — C7951 Secondary malignant neoplasm of bone: Secondary | ICD-10-CM | POA: Diagnosis not present

## 2014-11-08 DIAGNOSIS — C7952 Secondary malignant neoplasm of bone marrow: Principal | ICD-10-CM

## 2014-11-08 DIAGNOSIS — C50919 Malignant neoplasm of unspecified site of unspecified female breast: Secondary | ICD-10-CM

## 2014-11-08 DIAGNOSIS — C773 Secondary and unspecified malignant neoplasm of axilla and upper limb lymph nodes: Secondary | ICD-10-CM | POA: Diagnosis not present

## 2014-11-08 MED ORDER — FULVESTRANT 250 MG/5ML IM SOLN
500.0000 mg | Freq: Once | INTRAMUSCULAR | Status: AC
  Administered 2014-11-08: 500 mg via INTRAMUSCULAR
  Filled 2014-11-08: qty 10

## 2014-11-08 NOTE — Patient Instructions (Signed)
Fulvestrant injection What is this medicine? FULVESTRANT (ful VES trant) blocks the effects of estrogen. It is used to treat breast cancer in women past the age of menopause. This medicine may be used for other purposes; ask your health care provider or pharmacist if you have questions. COMMON BRAND NAME(S): FASLODEX What should I tell my health care provider before I take this medicine? They need to know if you have any of these conditions: -bleeding problems -liver disease -low levels of platelets in the blood -an unusual or allergic reaction to fulvestrant, other medicines, foods, dyes, or preservatives -pregnant or trying to get pregnant -breast-feeding How should I use this medicine? This medicine is for injection into a muscle. It is usually given by a health care professional in a hospital or clinic setting. Talk to your pediatrician regarding the use of this medicine in children. Special care may be needed. Overdosage: If you think you have taken too much of this medicine contact a poison control center or emergency room at once. NOTE: This medicine is only for you. Do not share this medicine with others. What if I miss a dose? It is important not to miss your dose. Call your doctor or health care professional if you are unable to keep an appointment. What may interact with this medicine? -medicines that treat or prevent blood clots like warfarin, enoxaparin, and dalteparin This list may not describe all possible interactions. Give your health care provider a list of all the medicines, herbs, non-prescription drugs, or dietary supplements you use. Also tell them if you smoke, drink alcohol, or use illegal drugs. Some items may interact with your medicine. What should I watch for while using this medicine? Your condition will be monitored carefully while you are receiving this medicine. You will need important blood work done while you are taking this medicine. Do not become pregnant  while taking this medicine. Women should inform their doctor if they wish to become pregnant or think they might be pregnant. There is a potential for serious side effects to an unborn child. Talk to your health care professional or pharmacist for more information. What side effects may I notice from receiving this medicine? Side effects that you should report to your doctor or health care professional as soon as possible: -allergic reactions like skin rash, itching or hives, swelling of the face, lips, or tongue -feeling faint or lightheaded, falls -fever or flu-like symptoms -sore throat -vaginal bleeding Side effects that usually do not require medical attention (report to your doctor or health care professional if they continue or are bothersome): -aches, pains -constipation or diarrhea -headache -hot flashes -nausea, vomiting -pain at site where injected -stomach pain This list may not describe all possible side effects. Call your doctor for medical advice about side effects. You may report side effects to FDA at 1-800-FDA-1088. Where should I keep my medicine? This drug is given in a hospital or clinic and will not be stored at home. NOTE: This sheet is a summary. It may not cover all possible information. If you have questions about this medicine, talk to your doctor, pharmacist, or health care provider.  2015, Elsevier/Gold Standard. (2007-05-15 15:39:24)  Denosumab injection What is this medicine? DENOSUMAB (den oh sue mab) slows bone breakdown. Prolia is used to treat osteoporosis in women after menopause and in men. Xgeva is used to prevent bone fractures and other bone problems caused by cancer bone metastases. Xgeva is also used to treat giant cell tumor of the bone.   This medicine may be used for other purposes; ask your health care provider or pharmacist if you have questions. COMMON BRAND NAME(S): Prolia, XGEVA What should I tell my health care provider before I take this  medicine? They need to know if you have any of these conditions: -dental disease -eczema -infection or history of infections -kidney disease or on dialysis -low blood calcium or vitamin D -malabsorption syndrome -scheduled to have surgery or tooth extraction -taking medicine that contains denosumab -thyroid or parathyroid disease -an unusual reaction to denosumab, other medicines, foods, dyes, or preservatives -pregnant or trying to get pregnant -breast-feeding How should I use this medicine? This medicine is for injection under the skin. It is given by a health care professional in a hospital or clinic setting. If you are getting Prolia, a special MedGuide will be given to you by the pharmacist with each prescription and refill. Be sure to read this information carefully each time. For Prolia, talk to your pediatrician regarding the use of this medicine in children. Special care may be needed. For Xgeva, talk to your pediatrician regarding the use of this medicine in children. While this drug may be prescribed for children as young as 13 years for selected conditions, precautions do apply. Overdosage: If you think you've taken too much of this medicine contact a poison control center or emergency room at once. Overdosage: If you think you have taken too much of this medicine contact a poison control center or emergency room at once. NOTE: This medicine is only for you. Do not share this medicine with others. What if I miss a dose? It is important not to miss your dose. Call your doctor or health care professional if you are unable to keep an appointment. What may interact with this medicine? Do not take this medicine with any of the following medications: -other medicines containing denosumab This medicine may also interact with the following medications: -medicines that suppress the immune system -medicines that treat cancer -steroid medicines like prednisone or cortisone This list  may not describe all possible interactions. Give your health care provider a list of all the medicines, herbs, non-prescription drugs, or dietary supplements you use. Also tell them if you smoke, drink alcohol, or use illegal drugs. Some items may interact with your medicine. What should I watch for while using this medicine? Visit your doctor or health care professional for regular checks on your progress. Your doctor or health care professional may order blood tests and other tests to see how you are doing. Call your doctor or health care professional if you get a cold or other infection while receiving this medicine. Do not treat yourself. This medicine may decrease your body's ability to fight infection. You should make sure you get enough calcium and vitamin D while you are taking this medicine, unless your doctor tells you not to. Discuss the foods you eat and the vitamins you take with your health care professional. See your dentist regularly. Brush and floss your teeth as directed. Before you have any dental work done, tell your dentist you are receiving this medicine. Do not become pregnant while taking this medicine or for 5 months after stopping it. Women should inform their doctor if they wish to become pregnant or think they might be pregnant. There is a potential for serious side effects to an unborn child. Talk to your health care professional or pharmacist for more information. What side effects may I notice from receiving this medicine? Side effects that   you should report to your doctor or health care professional as soon as possible: -allergic reactions like skin rash, itching or hives, swelling of the face, lips, or tongue -breathing problems -chest pain -fast, irregular heartbeat -feeling faint or lightheaded, falls -fever, chills, or any other sign of infection -muscle spasms, tightening, or twitches -numbness or tingling -skin blisters or bumps, or is dry, peels, or red -slow  healing or unexplained pain in the mouth or jaw -unusual bleeding or bruising Side effects that usually do not require medical attention (Report these to your doctor or health care professional if they continue or are bothersome.): -muscle pain -stomach upset, gas This list may not describe all possible side effects. Call your doctor for medical advice about side effects. You may report side effects to FDA at 1-800-FDA-1088. Where should I keep my medicine? This medicine is only given in a clinic, doctor's office, or other health care setting and will not be stored at home. NOTE: This sheet is a summary. It may not cover all possible information. If you have questions about this medicine, talk to your doctor, pharmacist, or health care provider.  2015, Elsevier/Gold Standard. (2011-07-05 12:37:47)  

## 2014-11-08 NOTE — Telephone Encounter (Signed)
per pt req to sch time & date for appt to 12/16-gave updated avs

## 2014-12-03 ENCOUNTER — Other Ambulatory Visit: Payer: Self-pay | Admitting: *Deleted

## 2014-12-03 ENCOUNTER — Telehealth: Payer: Self-pay | Admitting: *Deleted

## 2014-12-03 DIAGNOSIS — K121 Other forms of stomatitis: Secondary | ICD-10-CM

## 2014-12-03 DIAGNOSIS — C50919 Malignant neoplasm of unspecified site of unspecified female breast: Secondary | ICD-10-CM

## 2014-12-03 DIAGNOSIS — C7951 Secondary malignant neoplasm of bone: Principal | ICD-10-CM

## 2014-12-03 MED ORDER — ALPRAZOLAM 0.5 MG PO TABS: 0.5000 mg | ORAL_TABLET | Freq: Three times a day (TID) | ORAL | Status: DC | PRN

## 2014-12-03 MED ORDER — HYDROCODONE-ACETAMINOPHEN 10-325 MG PO TABS: 1.0000 | ORAL_TABLET | ORAL | Status: DC | PRN

## 2014-12-03 NOTE — Telephone Encounter (Signed)
Patient would like a refill of her Norco and Xanax. Patient will pick up Friday (Same day as injection). Message forwarded to RN Amy.

## 2014-12-03 NOTE — Telephone Encounter (Signed)
Left voice message notifying pt that scripts are ready for p/u at her convenience.

## 2014-12-06 ENCOUNTER — Ambulatory Visit (HOSPITAL_BASED_OUTPATIENT_CLINIC_OR_DEPARTMENT_OTHER): Payer: 59

## 2014-12-06 VITALS — BP 142/58 | HR 80 | Temp 98.4°F

## 2014-12-06 DIAGNOSIS — Z5111 Encounter for antineoplastic chemotherapy: Secondary | ICD-10-CM

## 2014-12-06 DIAGNOSIS — C50912 Malignant neoplasm of unspecified site of left female breast: Secondary | ICD-10-CM

## 2014-12-06 DIAGNOSIS — C7951 Secondary malignant neoplasm of bone: Secondary | ICD-10-CM

## 2014-12-06 DIAGNOSIS — C50919 Malignant neoplasm of unspecified site of unspecified female breast: Secondary | ICD-10-CM

## 2014-12-06 DIAGNOSIS — C773 Secondary and unspecified malignant neoplasm of axilla and upper limb lymph nodes: Secondary | ICD-10-CM

## 2014-12-06 MED ORDER — FULVESTRANT 250 MG/5ML IM SOLN
500.0000 mg | Freq: Once | INTRAMUSCULAR | Status: AC
  Administered 2014-12-06: 500 mg via INTRAMUSCULAR
  Filled 2014-12-06: qty 10

## 2014-12-31 ENCOUNTER — Telehealth: Payer: Self-pay | Admitting: Oncology

## 2014-12-31 NOTE — Telephone Encounter (Signed)
Due to LT our per BS patient to keep 12/16 inj and f/u one mth after for lab/LT/inj. Called patient re change and patient answered during my leaving a message. Spoke with patient re schedule changes for 12/16 and gave her time for 12/16 inj appointment at 11 am and lab/LT/inj 01/31/15 @ 9:15 am.

## 2015-01-02 ENCOUNTER — Other Ambulatory Visit: Payer: Self-pay | Admitting: *Deleted

## 2015-01-02 DIAGNOSIS — C50919 Malignant neoplasm of unspecified site of unspecified female breast: Secondary | ICD-10-CM

## 2015-01-02 DIAGNOSIS — C7951 Secondary malignant neoplasm of bone: Secondary | ICD-10-CM

## 2015-01-02 DIAGNOSIS — K121 Other forms of stomatitis: Secondary | ICD-10-CM

## 2015-01-02 MED ORDER — HYDROCODONE-ACETAMINOPHEN 10-325 MG PO TABS: 1.0000 | ORAL_TABLET | ORAL | Status: DC | PRN

## 2015-01-02 MED ORDER — ALPRAZOLAM 0.5 MG PO TABS: 0.5000 mg | ORAL_TABLET | Freq: Three times a day (TID) | ORAL | Status: DC | PRN

## 2015-01-02 NOTE — Telephone Encounter (Signed)
Message from pt requesting to have prescriptions available for pick up when here for injection 12/16. Rx will be in prescription book.

## 2015-01-03 ENCOUNTER — Other Ambulatory Visit: Payer: 59

## 2015-01-03 ENCOUNTER — Ambulatory Visit: Payer: 59

## 2015-01-03 ENCOUNTER — Ambulatory Visit (HOSPITAL_BASED_OUTPATIENT_CLINIC_OR_DEPARTMENT_OTHER): Payer: 59

## 2015-01-03 ENCOUNTER — Other Ambulatory Visit (HOSPITAL_BASED_OUTPATIENT_CLINIC_OR_DEPARTMENT_OTHER): Payer: 59

## 2015-01-03 ENCOUNTER — Ambulatory Visit: Payer: 59 | Admitting: Nurse Practitioner

## 2015-01-03 VITALS — BP 140/56 | HR 76 | Temp 97.6°F

## 2015-01-03 DIAGNOSIS — C50912 Malignant neoplasm of unspecified site of left female breast: Secondary | ICD-10-CM

## 2015-01-03 DIAGNOSIS — C7951 Secondary malignant neoplasm of bone: Secondary | ICD-10-CM

## 2015-01-03 DIAGNOSIS — C50919 Malignant neoplasm of unspecified site of unspecified female breast: Secondary | ICD-10-CM

## 2015-01-03 DIAGNOSIS — Z5111 Encounter for antineoplastic chemotherapy: Secondary | ICD-10-CM

## 2015-01-03 DIAGNOSIS — K121 Other forms of stomatitis: Secondary | ICD-10-CM

## 2015-01-03 LAB — CBC WITH DIFFERENTIAL/PLATELET
BASO%: 0.3 % (ref 0.0–2.0)
Basophils Absolute: 0 10*3/uL (ref 0.0–0.1)
EOS%: 4.1 % (ref 0.0–7.0)
Eosinophils Absolute: 0.2 10*3/uL (ref 0.0–0.5)
HCT: 37.3 % (ref 34.8–46.6)
HGB: 11.9 g/dL (ref 11.6–15.9)
LYMPH#: 0.4 10*3/uL — AB (ref 0.9–3.3)
LYMPH%: 9.9 % — ABNORMAL LOW (ref 14.0–49.7)
MCH: 29.2 pg (ref 25.1–34.0)
MCHC: 31.8 g/dL (ref 31.5–36.0)
MCV: 91.9 fL (ref 79.5–101.0)
MONO#: 0.5 10*3/uL (ref 0.1–0.9)
MONO%: 10.3 % (ref 0.0–14.0)
NEUT#: 3.4 10*3/uL (ref 1.5–6.5)
NEUT%: 75.4 % (ref 38.4–76.8)
Platelets: 157 10*3/uL (ref 145–400)
RBC: 4.06 10*6/uL (ref 3.70–5.45)
RDW: 14.2 % (ref 11.2–14.5)
WBC: 4.5 10*3/uL (ref 3.9–10.3)

## 2015-01-03 LAB — COMPREHENSIVE METABOLIC PANEL
ALT: 17 U/L (ref 0–55)
AST: 22 U/L (ref 5–34)
Albumin: 3.8 g/dL (ref 3.5–5.0)
Alkaline Phosphatase: 138 U/L (ref 40–150)
Anion Gap: 10 mEq/L (ref 3–11)
BUN: 35.9 mg/dL — AB (ref 7.0–26.0)
CHLORIDE: 109 meq/L (ref 98–109)
CO2: 20 meq/L — AB (ref 22–29)
Calcium: 9.4 mg/dL (ref 8.4–10.4)
Creatinine: 1.2 mg/dL — ABNORMAL HIGH (ref 0.6–1.1)
EGFR: 50 mL/min/{1.73_m2} — ABNORMAL LOW (ref >90)
GLUCOSE: 69 mg/dL — AB (ref 70–140)
POTASSIUM: 4.6 meq/L (ref 3.5–5.1)
SODIUM: 139 meq/L (ref 136–145)
Total Bilirubin: <0.3 mg/dL (ref 0.20–1.20)
Total Protein: 7.2 g/dL (ref 6.4–8.3)

## 2015-01-03 MED ORDER — FULVESTRANT 250 MG/5ML IM SOLN
500.0000 mg | Freq: Once | INTRAMUSCULAR | Status: AC
  Administered 2015-01-03: 500 mg via INTRAMUSCULAR
  Filled 2015-01-03: qty 10

## 2015-01-03 MED ORDER — DENOSUMAB 120 MG/1.7ML ~~LOC~~ SOLN
120.0000 mg | Freq: Once | SUBCUTANEOUS | Status: AC
  Administered 2015-01-03: 120 mg via SUBCUTANEOUS
  Filled 2015-01-03: qty 1.7

## 2015-01-04 LAB — CANCER ANTIGEN 27.29: CA 27.29: 27 U/mL (ref 0–39)

## 2015-01-06 ENCOUNTER — Telehealth: Payer: Self-pay | Admitting: *Deleted

## 2015-01-06 NOTE — Telephone Encounter (Signed)
-----   Message from Ladell Pier, MD sent at 01/05/2015 11:09 AM EST ----- Please call patient, ca27.29 is normal

## 2015-01-06 NOTE — Telephone Encounter (Signed)
Per Dr Benay Spice; notified pt that ca27.29 is normal.  Pt verbalized understanding.

## 2015-01-10 ENCOUNTER — Ambulatory Visit: Payer: 59 | Admitting: Nurse Practitioner

## 2015-01-10 ENCOUNTER — Other Ambulatory Visit: Payer: 59

## 2015-01-10 ENCOUNTER — Ambulatory Visit: Payer: 59

## 2015-01-17 ENCOUNTER — Telehealth: Payer: Self-pay | Admitting: Oncology

## 2015-01-17 NOTE — Telephone Encounter (Signed)
Moved 1/13 lab/fu/inj from AM to 1:45 pm. Spoke with patient she is aware and ok with coming in the PM (room/maintience resource).

## 2015-01-31 ENCOUNTER — Telehealth: Payer: Self-pay | Admitting: Oncology

## 2015-01-31 ENCOUNTER — Other Ambulatory Visit (HOSPITAL_BASED_OUTPATIENT_CLINIC_OR_DEPARTMENT_OTHER): Payer: 59

## 2015-01-31 ENCOUNTER — Ambulatory Visit: Payer: 59

## 2015-01-31 ENCOUNTER — Ambulatory Visit (HOSPITAL_BASED_OUTPATIENT_CLINIC_OR_DEPARTMENT_OTHER): Payer: 59 | Admitting: Nurse Practitioner

## 2015-01-31 VITALS — BP 139/66 | HR 82 | Temp 97.4°F | Resp 17 | Ht 63.0 in | Wt 213.6 lb

## 2015-01-31 DIAGNOSIS — C50919 Malignant neoplasm of unspecified site of unspecified female breast: Secondary | ICD-10-CM

## 2015-01-31 DIAGNOSIS — I10 Essential (primary) hypertension: Secondary | ICD-10-CM

## 2015-01-31 DIAGNOSIS — C773 Secondary and unspecified malignant neoplasm of axilla and upper limb lymph nodes: Secondary | ICD-10-CM

## 2015-01-31 DIAGNOSIS — G893 Neoplasm related pain (acute) (chronic): Secondary | ICD-10-CM

## 2015-01-31 DIAGNOSIS — C7951 Secondary malignant neoplasm of bone: Principal | ICD-10-CM

## 2015-01-31 DIAGNOSIS — E119 Type 2 diabetes mellitus without complications: Secondary | ICD-10-CM

## 2015-01-31 DIAGNOSIS — M79605 Pain in left leg: Secondary | ICD-10-CM

## 2015-01-31 DIAGNOSIS — C50912 Malignant neoplasm of unspecified site of left female breast: Secondary | ICD-10-CM

## 2015-01-31 DIAGNOSIS — M545 Low back pain: Secondary | ICD-10-CM

## 2015-01-31 DIAGNOSIS — M069 Rheumatoid arthritis, unspecified: Secondary | ICD-10-CM

## 2015-01-31 DIAGNOSIS — Z803 Family history of malignant neoplasm of breast: Secondary | ICD-10-CM

## 2015-01-31 DIAGNOSIS — K121 Other forms of stomatitis: Secondary | ICD-10-CM

## 2015-01-31 DIAGNOSIS — Z5111 Encounter for antineoplastic chemotherapy: Secondary | ICD-10-CM | POA: Diagnosis not present

## 2015-01-31 LAB — COMPREHENSIVE METABOLIC PANEL
ALK PHOS: 127 U/L (ref 40–150)
ALT: 17 U/L (ref 0–55)
AST: 22 U/L (ref 5–34)
Albumin: 3.7 g/dL (ref 3.5–5.0)
Anion Gap: 11 mEq/L (ref 3–11)
BUN: 30.3 mg/dL — ABNORMAL HIGH (ref 7.0–26.0)
CO2: 21 meq/L — AB (ref 22–29)
Calcium: 8.6 mg/dL (ref 8.4–10.4)
Chloride: 109 mEq/L (ref 98–109)
Creatinine: 1.2 mg/dL — ABNORMAL HIGH (ref 0.6–1.1)
EGFR: 46 mL/min/{1.73_m2} — AB (ref >90)
GLUCOSE: 78 mg/dL (ref 70–140)
POTASSIUM: 5 meq/L (ref 3.5–5.1)
SODIUM: 141 meq/L (ref 136–145)
Total Bilirubin: <0.3 mg/dL (ref 0.20–1.20)
Total Protein: 7.1 g/dL (ref 6.4–8.3)

## 2015-01-31 MED ORDER — FULVESTRANT 250 MG/5ML IM SOLN
500.0000 mg | Freq: Once | INTRAMUSCULAR | Status: AC
  Administered 2015-01-31: 500 mg via INTRAMUSCULAR
  Filled 2015-01-31: qty 10

## 2015-01-31 MED ORDER — OXYCODONE-ACETAMINOPHEN 10-325 MG PO TABS: 1.0000 | ORAL_TABLET | ORAL | Status: DC | PRN

## 2015-01-31 MED ORDER — ALPRAZOLAM 0.5 MG PO TABS: 0.5000 mg | ORAL_TABLET | Freq: Two times a day (BID) | ORAL | Status: DC | PRN

## 2015-01-31 NOTE — Progress Notes (Signed)
[facility] OFFICE PROGRESS NOTE   Diagnosis:  Breast cancer  INTERVAL HISTORY:   [name] returns as scheduled. She continues monthly Faslodex. She reports increased pain at the low back and left hip. She attributes the increase in pain to providing care for her husband. 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 This requires a significant amount of bending. She is taking 6-6-1/2 Norco tablets a day with incomplete relief. No leg weakness or numbness. No bowel or bladder dysfunction. She is much more anxious. She also attributes this to providing care for her husband. She is having difficulty sleeping. Xanax 0.5 mg at bedtime is no longer effective.  Objective:  Vital signs in last 24 hours:  Blood pressure 139/66, pulse 82, temperature 97.4 F (36.3 C), temperature source Oral, resp. rate 17, height '5\' 3"'  (1.6 m), weight 213 lb 9.6 oz (96.888 kg), SpO2 100 %.    HEENT: No thrush or ulcers. Lymphatics: No palpable cervical, supraclavicular or axillary lymph nodes. Resp: Lungs clear bilaterally. Cardio: Regular rate and rhythm. GI: Abdomen soft and nontender. No hepatomegaly. Vascular: No leg edema. Breast: Status post post left mastectomy. No evidence for chest wall tumor recurrence.    Lab Results:  Lab Results  Component Value Date   WBC 4.5 01/03/2015   HGB 11.9 01/03/2015   HCT 37.3 01/03/2015   MCV 91.9 01/03/2015   PLT 157 01/03/2015   NEUTROABS 3.4 01/03/2015    Imaging:  No results found.  Medications: I have reviewed the patient's current medications.  Assessment/Plan: 1. 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 ER positive, PR positive, HER-2 negative She completed 4 cycles of adjuvant AC chemotherapy June 15 through September 03, 2008, and then completed the left chest wall radiation. She began  Arimidex following an office visit on November 11, 2008.  Bone scan 06/01/2013 suggestive of thoracic spine metastases, thoracic MRI 06/27/2011 consistent with multiple bone metastases involving the thoracolumbar spine   PET scan 07/09/2013 with multiple hypermetabolic bone lesions and hypermetabolic mediastinal nodes.   Left iliac lesion biopsy 07/26/2013. Pathology showed metastatic carcinoma, ER positive, PR positive, HER-2 negative   Initiation of Faslodex 08/10/2013.  Xgeva every 3 months initiated 07/27/2013.  Restaging PET scan 01/23/2014 with no residual hypermetabolic mediastinal disease; marked improvement in the metastatic bone disease. 2. Left chest subcutaneous mass noted on exam October 04, 2008, status post biopsy by Dr. Brantley Stage with a benign pathology. 3. History of delayed healing of the left mastectomy incision. 4. Rheumatoid arthritis. 5. Diabetes. 6. Hypercholesterolemia. 7. Hypertension. 8. Family history of breast cancer. She has been evaluated at the genetic screening clinic. 9. 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   Disposition: [name] is having increased low back and left hip pain. We adjusted her pain medication to Percocet 10/325 one tablet every 4 hours as needed and referred her for a restaging PET scan. She understands to contact the office if the pain worsens, is poorly controlled or she develops any new symptoms such as leg weakness, numbness, bowel/bladder dysfunction.  For the difficulty sleeping she will try increasing the Xanax to 1 mg at bedtime.   She understands she should not take Xanax and Percocet at the same time and that she should not  be driving while taking these medications.  She will receive a Faslodex injection today.  She will return for a follow-up visit in 4 weeks. She  will contact the office in the interim as outlined above or with any other problems.  Plan reviewed with Dr. Benay Spice. 25 minutes were spent face-to-face at today's visit with the majority of that time involved in counseling/coordination of care.    [name] ANP/GNP-BC   01/31/2015  1:57 PM

## 2015-01-31 NOTE — Telephone Encounter (Signed)
Gave patient avs report and appointments for Feb.

## 2015-02-17 ENCOUNTER — Ambulatory Visit (HOSPITAL_COMMUNITY)
Admission: RE | Admit: 2015-02-17 | Discharge: 2015-02-17 | Disposition: A | Discharge status: Home / Self Care | Payer: 59 | Source: Ambulatory Visit | Attending: Nurse Practitioner | Admitting: Nurse Practitioner

## 2015-02-17 DIAGNOSIS — C50919 Malignant neoplasm of unspecified site of unspecified female breast: Secondary | ICD-10-CM | POA: Diagnosis present

## 2015-02-17 DIAGNOSIS — C7951 Secondary malignant neoplasm of bone: Secondary | ICD-10-CM | POA: Diagnosis not present

## 2015-02-17 LAB — GLUCOSE, CAPILLARY: GLUCOSE-CAPILLARY: 85 mg/dL (ref 65–99)

## 2015-02-17 MED ORDER — FLUDEOXYGLUCOSE F - 18 (FDG) INJECTION
10.6600 | Freq: Once | INTRAVENOUS | Status: AC | PRN
  Administered 2015-02-17: 10.66 via INTRAVENOUS

## 2015-02-18 ENCOUNTER — Encounter: Payer: Self-pay | Admitting: Oncology

## 2015-02-18 NOTE — Progress Notes (Signed)
per [facility], they need notes/labs faxed [phone]- sharepoint noted

## 2015-02-20 ENCOUNTER — Telehealth: Payer: Self-pay | Admitting: *Deleted

## 2015-02-20 NOTE — Telephone Encounter (Signed)
Pt called requesting PET results

## 2015-02-20 NOTE — Telephone Encounter (Signed)
Pt notified of PET results by Dr. Benay Spice.

## 2015-02-28 ENCOUNTER — Telehealth: Payer: Self-pay | Admitting: Oncology

## 2015-02-28 ENCOUNTER — Ambulatory Visit (HOSPITAL_BASED_OUTPATIENT_CLINIC_OR_DEPARTMENT_OTHER): Payer: 59 | Admitting: Nurse Practitioner

## 2015-02-28 ENCOUNTER — Other Ambulatory Visit (HOSPITAL_BASED_OUTPATIENT_CLINIC_OR_DEPARTMENT_OTHER): Payer: 59

## 2015-02-28 ENCOUNTER — Ambulatory Visit (HOSPITAL_BASED_OUTPATIENT_CLINIC_OR_DEPARTMENT_OTHER): Payer: 59

## 2015-02-28 VITALS — BP 137/74 | HR 90 | Temp 98.2°F | Resp 17 | Ht 63.0 in | Wt 214.2 lb

## 2015-02-28 DIAGNOSIS — C50919 Malignant neoplasm of unspecified site of unspecified female breast: Secondary | ICD-10-CM | POA: Diagnosis not present

## 2015-02-28 DIAGNOSIS — C7951 Secondary malignant neoplasm of bone: Secondary | ICD-10-CM

## 2015-02-28 DIAGNOSIS — Z5111 Encounter for antineoplastic chemotherapy: Secondary | ICD-10-CM | POA: Diagnosis not present

## 2015-02-28 DIAGNOSIS — E119 Type 2 diabetes mellitus without complications: Secondary | ICD-10-CM

## 2015-02-28 DIAGNOSIS — G893 Neoplasm related pain (acute) (chronic): Secondary | ICD-10-CM | POA: Diagnosis not present

## 2015-02-28 DIAGNOSIS — I10 Essential (primary) hypertension: Secondary | ICD-10-CM

## 2015-02-28 DIAGNOSIS — C50912 Malignant neoplasm of unspecified site of left female breast: Secondary | ICD-10-CM

## 2015-02-28 LAB — COMPREHENSIVE METABOLIC PANEL
ALK PHOS: 141 U/L (ref 40–150)
ALT: 15 U/L (ref 0–55)
AST: 19 U/L (ref 5–34)
Albumin: 3.9 g/dL (ref 3.5–5.0)
Anion Gap: 12 mEq/L — ABNORMAL HIGH (ref 3–11)
BUN: 33 mg/dL — AB (ref 7.0–26.0)
CHLORIDE: 106 meq/L (ref 98–109)
CO2: 22 meq/L (ref 22–29)
Calcium: 10.5 mg/dL — ABNORMAL HIGH (ref 8.4–10.4)
Creatinine: 1.4 mg/dL — ABNORMAL HIGH (ref 0.6–1.1)
EGFR: 40 mL/min/{1.73_m2} — AB (ref >90)
GLUCOSE: 59 mg/dL — AB (ref 70–140)
POTASSIUM: 4.6 meq/L (ref 3.5–5.1)
SODIUM: 140 meq/L (ref 136–145)
Total Bilirubin: <0.3 mg/dL (ref 0.20–1.20)
Total Protein: 7.5 g/dL (ref 6.4–8.3)

## 2015-02-28 LAB — CBC WITH DIFFERENTIAL/PLATELET
BASO%: 0.2 % (ref 0.0–2.0)
BASOS ABS: 0 10*3/uL (ref 0.0–0.1)
EOS%: 4.2 % (ref 0.0–7.0)
Eosinophils Absolute: 0.2 10*3/uL (ref 0.0–0.5)
HEMATOCRIT: 39.3 % (ref 34.8–46.6)
HGB: 12.4 g/dL (ref 11.6–15.9)
LYMPH#: 0.7 10*3/uL — AB (ref 0.9–3.3)
LYMPH%: 13.3 % — AB (ref 14.0–49.7)
MCH: 29.2 pg (ref 25.1–34.0)
MCHC: 31.6 g/dL (ref 31.5–36.0)
MCV: 92.1 fL (ref 79.5–101.0)
MONO#: 0.7 10*3/uL (ref 0.1–0.9)
MONO%: 11.9 % (ref 0.0–14.0)
NEUT#: 3.9 10*3/uL (ref 1.5–6.5)
NEUT%: 70.4 % (ref 38.4–76.8)
PLATELETS: 180 10*3/uL (ref 145–400)
RBC: 4.27 10*6/uL (ref 3.70–5.45)
RDW: 14.5 % (ref 11.2–14.5)
WBC: 5.5 10*3/uL (ref 3.9–10.3)

## 2015-02-28 MED ORDER — OXYCODONE-ACETAMINOPHEN 10-325 MG PO TABS: 1.0000 | ORAL_TABLET | ORAL | Status: DC | PRN

## 2015-02-28 MED ORDER — FULVESTRANT 250 MG/5ML IM SOLN
500.0000 mg | Freq: Once | INTRAMUSCULAR | Status: AC
  Administered 2015-02-28: 500 mg via INTRAMUSCULAR
  Filled 2015-02-28: qty 10

## 2015-02-28 MED ORDER — ALPRAZOLAM 0.5 MG PO TABS: 0.5000 mg | ORAL_TABLET | Freq: Two times a day (BID) | ORAL | Status: DC | PRN

## 2015-02-28 NOTE — Telephone Encounter (Signed)
Appointments made and avs printed °

## 2015-02-28 NOTE — Progress Notes (Addendum)
[name] OFFICE PROGRESS NOTE   Diagnosis:  Breast cancer  INTERVAL HISTORY:   [name] returns as scheduled. She continues monthly Faslodex and receives [place] every 3 months. She reports her pain is controlled with Percocet 4-6 tablets a day. Main pain is involving left hip and lower back.  Objective:  Vital signs in last 24 hours:  Blood pressure 137/74, pulse 90, temperature 98.2 F (36.8 C), temperature source Oral, resp. rate 17, height '5\' 3"'  (1.6 m), weight 214 lb 3.2 oz (97.16 kg), SpO2 100 %.    HEENT: No thrush or ulcers. Lymphatics: No left axillary adenopathy. Resp: Lungs clear bilaterally. Cardio: Regular rate and rhythm. GI: Abdomen soft and nontender. No hepatomegaly. Vascular: No leg edema. Status post left mastectomy. No evidence for chest wall tumor recurrence.   Lab Results:  Lab Results  Component Value Date   WBC 5.5 02/28/2015   HGB 12.4 02/28/2015   HCT 39.3 02/28/2015   MCV 92.1 02/28/2015   PLT 180 02/28/2015   NEUTROABS 3.9 02/28/2015    Imaging:  No results found.  Medications: I have reviewed the patient's current medications.  Assessment/Plan: 1. 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 ER positive, PR positive, HER-2 negative She completed 4 cycles of adjuvant AC chemotherapy June 15 through September 03, 2008, and then completed the left chest wall radiation. She began Arimidex following an office visit on November 11, 2008.  Bone scan 06/01/2013 suggestive of thoracic spine metastases, thoracic MRI 06/27/2011 consistent with multiple bone metastases involving the thoracolumbar spine   PET scan 07/09/2013 with multiple hypermetabolic bone lesions and hypermetabolic mediastinal nodes.   Left iliac lesion biopsy 07/26/2013. Pathology showed metastatic carcinoma, ER positive, PR  positive, HER-2 negative   Initiation of Faslodex 08/10/2013.  Xgeva every 3 months initiated 07/27/2013.  Restaging PET scan 01/23/2014 with no residual hypermetabolic mediastinal disease; marked improvement in the metastatic bone disease.  Restaging PET scan 02/17/2015 with recurrence of skeletal metastasis. New hypermetabolic lesions within the pelvis and ribs. No hypermetabolic lymphadenopathy. 2. Left chest subcutaneous mass noted on exam October 04, 2008, status post biopsy by Dr. Brantley Stage with a benign pathology. 3. History of delayed healing of the left mastectomy incision. 4. Rheumatoid arthritis. 5. Diabetes. 6. Hypercholesterolemia. 7. Hypertension. 8. Family history of breast cancer. She has been evaluated at the genetic screening clinic. 9. 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   Disposition: [name] has progressive metastatic breast cancer involving the bones. We reviewed the recent PET scan results/images with her on the computer. Dr. Benay Spice recommends continuation of Faslodex and the addition of Ibrance 21 days of a 28 day cycle. We reviewed potential toxicities associated with Ibrance including myelotoxicity, nausea, vomiting, mouth sores, diarrhea, constipation, fatigue, hair loss. She is agreeable to proceed. Delton See will be continued every 3 months.  She will return for a follow-up visit and CBC approximately 2 weeks after beginning Ibrance.  Patient seen with Dr. Benay Spice. 30 minutes were spent face-to-face at today's visit with the majority of that time involving counseling/coordination of care.    [name] ANP/GNP-BC   02/28/2015  10:11 AM   This was a shared visit with [name]. He reviewed the PET images with [name] and her family. There  is clinical and x-ray evidence of disease  progression on Faslodex. She will begin Ibrance with continuation of Faslodex. We reviewed the potential toxicities associated with Ibrance.  [name], M.D.

## 2015-03-01 LAB — CANCER ANTIGEN 27-29 (PARALLEL TESTING): CA 27.29: 35 U/mL (ref <38)

## 2015-03-01 LAB — CANCER ANTIGEN 27.29: CAN 27.29: 32.7 U/mL (ref 0.0–38.6)

## 2015-03-03 ENCOUNTER — Other Ambulatory Visit: Payer: Self-pay | Admitting: Nurse Practitioner

## 2015-03-03 DIAGNOSIS — C50912 Malignant neoplasm of unspecified site of left female breast: Secondary | ICD-10-CM

## 2015-03-03 DIAGNOSIS — C7951 Secondary malignant neoplasm of bone: Principal | ICD-10-CM

## 2015-03-04 MED FILL — *IBRANCE 125 MG CAPSULE: 125 | 28 days supply | Qty: 21 | Fill #0

## 2015-03-06 ENCOUNTER — Encounter: Payer: Self-pay | Admitting: Pharmacist

## 2015-03-06 NOTE — Progress Notes (Signed)
Oral Chemotherapy Pharmacist Encounter   I spoke with patient for overview of new oral chemotherapy medication: Ibrance. Pt is doing well. The prescriptions have been sent to the [facility] for benefit analysis and approval. Copay card was used as patient has commercial insurance to bring cost down to < $25. Patient started medication last night on 2/15 and is doing well.   Counseled patient on administration, dosing, side effects, safe handling, and monitoring. Side effects include but not limited to: Myelosuppression, fatigue, headache, rash, nausea/vomiting and mouth sores.  [name] voiced understanding and appreciation.   All questions answered.  Will follow up in 1-2 weeks for adherence and toxicity management.   Thank you,  Montel Clock, PharmD, [facility]

## 2015-03-13 ENCOUNTER — Encounter: Payer: Self-pay | Admitting: Pharmacist

## 2015-03-13 NOTE — Progress Notes (Signed)
Oral Chemotherapy Follow-Up Form  Original Start date of oral chemotherapy: _2/15/2017___   Called patient today to follow up regarding patient's oral chemotherapy medication: _Ibrance_  Pt is doing well today. No major side effects. She is able to complete regular activities and go out on her errands. Occasional minor headache and mild diarrhea. [name] takes a half tablet of imodium if needed. No other symptoms to report. Follow up labs and visit next week on 3/2 (Day 14 of cycle 1) with [name].   Pt reports _0__ tablets/doses missed in the last week/month.   Pt reports the following side effects: _Mild headache and occasional diarrhea____    Will follow up and call patient again in _2 weeks____   Thank you,  Montel Clock, PharmD, [facility]

## 2015-03-20 ENCOUNTER — Telehealth: Payer: Self-pay | Admitting: Nurse Practitioner

## 2015-03-20 ENCOUNTER — Other Ambulatory Visit (HOSPITAL_BASED_OUTPATIENT_CLINIC_OR_DEPARTMENT_OTHER): Payer: 59

## 2015-03-20 ENCOUNTER — Ambulatory Visit (HOSPITAL_BASED_OUTPATIENT_CLINIC_OR_DEPARTMENT_OTHER): Payer: 59 | Admitting: Nurse Practitioner

## 2015-03-20 VITALS — BP 135/40 | HR 79 | Temp 97.5°F | Resp 17 | Ht 63.0 in | Wt 216.3 lb

## 2015-03-20 DIAGNOSIS — C50912 Malignant neoplasm of unspecified site of left female breast: Secondary | ICD-10-CM

## 2015-03-20 DIAGNOSIS — I10 Essential (primary) hypertension: Secondary | ICD-10-CM

## 2015-03-20 DIAGNOSIS — D6959 Other secondary thrombocytopenia: Secondary | ICD-10-CM

## 2015-03-20 DIAGNOSIS — M069 Rheumatoid arthritis, unspecified: Secondary | ICD-10-CM

## 2015-03-20 DIAGNOSIS — E119 Type 2 diabetes mellitus without complications: Secondary | ICD-10-CM

## 2015-03-20 DIAGNOSIS — C7951 Secondary malignant neoplasm of bone: Secondary | ICD-10-CM

## 2015-03-20 DIAGNOSIS — G893 Neoplasm related pain (acute) (chronic): Secondary | ICD-10-CM

## 2015-03-20 DIAGNOSIS — D709 Neutropenia, unspecified: Secondary | ICD-10-CM

## 2015-03-20 DIAGNOSIS — C50919 Malignant neoplasm of unspecified site of unspecified female breast: Secondary | ICD-10-CM

## 2015-03-20 LAB — COMPREHENSIVE METABOLIC PANEL
ALBUMIN: 3.7 g/dL (ref 3.5–5.0)
ALK PHOS: 122 U/L (ref 40–150)
ALT: 12 U/L (ref 0–55)
ANION GAP: 10 meq/L (ref 3–11)
AST: 18 U/L (ref 5–34)
BILIRUBIN TOTAL: 0.31 mg/dL (ref 0.20–1.20)
BUN: 37.5 mg/dL — ABNORMAL HIGH (ref 7.0–26.0)
CO2: 22 meq/L (ref 22–29)
CREATININE: 1.7 mg/dL — AB (ref 0.6–1.1)
Calcium: 9.5 mg/dL (ref 8.4–10.4)
Chloride: 110 mEq/L — ABNORMAL HIGH (ref 98–109)
EGFR: 31 mL/min/{1.73_m2} — AB (ref >90)
GLUCOSE: 55 mg/dL — AB (ref 70–140)
Potassium: 4.9 mEq/L (ref 3.5–5.1)
Sodium: 142 mEq/L (ref 136–145)
TOTAL PROTEIN: 7.2 g/dL (ref 6.4–8.3)

## 2015-03-20 LAB — CBC WITH DIFFERENTIAL/PLATELET
BASO%: 0 % (ref 0.0–2.0)
BASOS ABS: 0 10*3/uL (ref 0.0–0.1)
EOS ABS: 0.1 10*3/uL (ref 0.0–0.5)
EOS%: 3.4 % (ref 0.0–7.0)
HCT: 33.9 % — ABNORMAL LOW (ref 34.8–46.6)
HGB: 10.9 g/dL — ABNORMAL LOW (ref 11.6–15.9)
LYMPH%: 25 % (ref 14.0–49.7)
MCH: 29.9 pg (ref 25.1–34.0)
MCHC: 32.2 g/dL (ref 31.5–36.0)
MCV: 93.1 fL (ref 79.5–101.0)
MONO#: 0.1 10*3/uL (ref 0.1–0.9)
MONO%: 6.7 % (ref 0.0–14.0)
NEUT#: 1.4 10*3/uL — ABNORMAL LOW (ref 1.5–6.5)
NEUT%: 64.9 % (ref 38.4–76.8)
PLATELETS: 83 10*3/uL — AB (ref 145–400)
RBC: 3.64 10*6/uL — AB (ref 3.70–5.45)
RDW: 13.7 % (ref 11.2–14.5)
WBC: 2.1 10*3/uL — AB (ref 3.9–10.3)
lymph#: 0.5 10*3/uL — ABNORMAL LOW (ref 0.9–3.3)

## 2015-03-20 MED ORDER — ALPRAZOLAM 0.5 MG PO TABS: 0.5000 mg | ORAL_TABLET | Freq: Two times a day (BID) | ORAL | Status: DC | PRN

## 2015-03-20 MED ORDER — OXYCODONE-ACETAMINOPHEN 10-325 MG PO TABS: 1.0000 | ORAL_TABLET | ORAL | Status: DC | PRN

## 2015-03-20 NOTE — Progress Notes (Signed)
[place] OFFICE PROGRESS NOTE   Diagnosis:  Breast cancer  INTERVAL HISTORY:   [name] returns as scheduled. She continues monthly Faslodex and began cycle 1 Ibrance 03/05/2015. She initially had mild nausea. None at present. She thinks she may be slightly more fatigued. No mouth sores. No diarrhea. She has noted significant constipation. No fever. She continues to have pain mainly over the right ribs and left hip. She takes oxycodone as needed.  Objective:  Vital signs in last 24 hours:  Blood pressure 135/40, pulse 79, temperature 97.5 F (36.4 C), temperature source Oral, resp. rate 17, height '5\' 3"'  (1.6 m), weight 216 lb 4.8 oz (98.113 kg), SpO2 100 %.    HEENT: No thrush or ulcers. Resp: Lungs clear bilaterally. Cardio: Regular rate and rhythm. GI: Abdomen soft and nontender. No hepatomegaly. Vascular: No leg edema.  Lab Results:  Lab Results  Component Value Date   WBC 2.1* 03/20/2015   HGB 10.9* 03/20/2015   HCT 33.9* 03/20/2015   MCV 93.1 03/20/2015   PLT 83* 03/20/2015   NEUTROABS 1.4* 03/20/2015    Imaging:  No results found.  Medications: I have reviewed the patient's current medications.  Assessment/Plan: 1. 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 ER positive, PR positive, HER-2 negative She completed 4 cycles of adjuvant AC chemotherapy June 15 through September 03, 2008, and then completed the left chest wall radiation. She began Arimidex following an office visit on November 11, 2008.  Bone scan 06/01/2013 suggestive of thoracic spine metastases, thoracic MRI 06/27/2011 consistent with multiple bone metastases involving the thoracolumbar spine   PET scan 07/09/2013 with multiple hypermetabolic bone lesions and hypermetabolic mediastinal nodes.   Left iliac lesion biopsy 07/26/2013. Pathology showed  metastatic carcinoma, ER positive, PR positive, HER-2 negative   Initiation of Faslodex 08/10/2013.  Xgeva every 3 months initiated 07/27/2013.  Restaging PET scan 01/23/2014 with no residual hypermetabolic mediastinal disease; marked improvement in the metastatic bone disease.  Restaging PET scan 02/17/2015 with recurrence of skeletal metastasis. New hypermetabolic lesions within the pelvis and ribs. No hypermetabolic lymphadenopathy.  Continuation of Faslodex; initiation of Ibrance 03/05/2015 2. Left chest subcutaneous mass noted on exam October 04, 2008, status post biopsy by Dr. Brantley Stage with a benign pathology. 3. History of delayed healing of the left mastectomy incision. 4. Rheumatoid arthritis. 5. Diabetes. 6. Hypercholesterolemia. 7. Hypertension. 8. Family history of breast cancer. She has been evaluated at the genetic screening clinic. 9. 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     Disposition: [name] appears unchanged. She began cycle 1 Ibrance on 03/05/2015. She has mild neutropenia and thrombocytopenia on labs today. She will continue the Ibrance to complete the cycle. She understands to contact the office with fever, chills, other signs of infection, bleeding. She will return for a follow-up CBC in one week.  She also has progressive renal dysfunction on labs today. I discussed the renal dysfunction with pharmacy. No dose reduction recommended for Ibrance. We will repeat a basic metabolic panel in one week.  Next Faslodex 03/26/2015.  She will return for a follow-up visit and labs on 04/02/2015. The [facility] will likely be dose reduced with cycle 2.  Plan reviewed with Dr. Benay Spice. 25 minutes were spent face-to-face at today's visit  with the majority of that time involved in counseling/correlation of  care.  [name] ANP/GNP-BC   03/20/2015  11:04 AM

## 2015-03-20 NOTE — Telephone Encounter (Signed)
per pof to sch pt appt-sent Melissa email to override appt/pt aware

## 2015-03-26 ENCOUNTER — Ambulatory Visit (HOSPITAL_BASED_OUTPATIENT_CLINIC_OR_DEPARTMENT_OTHER): Payer: 59

## 2015-03-26 ENCOUNTER — Other Ambulatory Visit (HOSPITAL_BASED_OUTPATIENT_CLINIC_OR_DEPARTMENT_OTHER): Payer: 59

## 2015-03-26 VITALS — BP 141/63 | HR 77 | Temp 98.1°F

## 2015-03-26 DIAGNOSIS — Z5111 Encounter for antineoplastic chemotherapy: Secondary | ICD-10-CM

## 2015-03-26 DIAGNOSIS — C50919 Malignant neoplasm of unspecified site of unspecified female breast: Secondary | ICD-10-CM

## 2015-03-26 DIAGNOSIS — C50912 Malignant neoplasm of unspecified site of left female breast: Secondary | ICD-10-CM

## 2015-03-26 DIAGNOSIS — E162 Hypoglycemia, unspecified: Secondary | ICD-10-CM | POA: Diagnosis not present

## 2015-03-26 DIAGNOSIS — C7951 Secondary malignant neoplasm of bone: Secondary | ICD-10-CM

## 2015-03-26 LAB — BASIC METABOLIC PANEL
ANION GAP: 9 meq/L (ref 3–11)
BUN: 31.6 mg/dL — AB (ref 7.0–26.0)
CALCIUM: 8.5 mg/dL (ref 8.4–10.4)
CO2: 22 mEq/L (ref 22–29)
CREATININE: 1.4 mg/dL — AB (ref 0.6–1.1)
Chloride: 108 mEq/L (ref 98–109)
EGFR: 41 mL/min/{1.73_m2} — ABNORMAL LOW (ref >90)
GLUCOSE: 37 mg/dL — AB (ref 70–140)
POTASSIUM: 4.6 meq/L (ref 3.5–5.1)
Sodium: 140 mEq/L (ref 136–145)

## 2015-03-26 LAB — WHOLE BLOOD GLUCOSE
Glucose: 85 mg/dL (ref 70–100)
HRS PC: 4 Hours

## 2015-03-26 LAB — CBC WITH DIFFERENTIAL/PLATELET
BASO%: 0 % (ref 0.0–2.0)
BASOS ABS: 0 10*3/uL (ref 0.0–0.1)
EOS ABS: 0 10*3/uL (ref 0.0–0.5)
EOS%: 2.5 % (ref 0.0–7.0)
HEMATOCRIT: 32 % — AB (ref 34.8–46.6)
HGB: 10.5 g/dL — ABNORMAL LOW (ref 11.6–15.9)
LYMPH#: 0.5 10*3/uL — AB (ref 0.9–3.3)
LYMPH%: 30.2 % (ref 14.0–49.7)
MCH: 30.6 pg (ref 25.1–34.0)
MCHC: 32.8 g/dL (ref 31.5–36.0)
MCV: 93.3 fL (ref 79.5–101.0)
MONO#: 0.1 10*3/uL (ref 0.1–0.9)
MONO%: 6.2 % (ref 0.0–14.0)
NEUT#: 1 10*3/uL — ABNORMAL LOW (ref 1.5–6.5)
NEUT%: 61.1 % (ref 38.4–76.8)
PLATELETS: 51 10*3/uL — AB (ref 145–400)
RBC: 3.43 10*6/uL — AB (ref 3.70–5.45)
RDW: 13.9 % (ref 11.2–14.5)
WBC: 1.6 10*3/uL — ABNORMAL LOW (ref 3.9–10.3)

## 2015-03-26 MED ORDER — DENOSUMAB 120 MG/1.7ML ~~LOC~~ SOLN
120.0000 mg | Freq: Once | SUBCUTANEOUS | Status: AC
  Administered 2015-03-26: 120 mg via SUBCUTANEOUS
  Filled 2015-03-26: qty 1.7

## 2015-03-26 MED ORDER — FULVESTRANT 250 MG/5ML IM SOLN
500.0000 mg | Freq: Once | INTRAMUSCULAR | Status: AC
  Administered 2015-03-26: 500 mg via INTRAMUSCULAR
  Filled 2015-03-26: qty 10

## 2015-03-26 NOTE — Progress Notes (Signed)
Critical glucose noted, reviewed by Dr. Benay Spice. Pt given refreshments. Will recheck glucose level.

## 2015-03-27 ENCOUNTER — Telehealth: Payer: Self-pay | Admitting: Nurse Practitioner

## 2015-03-27 NOTE — Telephone Encounter (Signed)
-----   Message from Owens Shark, NP sent at 03/27/2015  3:09 PM EST ----- Please follow-up with her regarding the low blood sugar from yesterday. Does she check blood sugars at home? How have the readings been today? Please have her call with fever/bleeding.

## 2015-03-27 NOTE — Telephone Encounter (Signed)
TC placed to Pt. To follow up from blood sugar result from yesterday. Pt Stated she took blood sugar this morning it was 110 will take again in the evening. Also if she has any signs or symptoms of fever or bleeding to call  [place]. Pt. Verbalized understanding. No further questions at this time. Appreciated the follow up call.

## 2015-03-28 ENCOUNTER — Encounter: Payer: Self-pay | Admitting: Pharmacist

## 2015-03-28 NOTE — Progress Notes (Signed)
Oral Chemotherapy Follow-Up Form  Original Start date of oral chemotherapy: _2/15/2017_   Called patient today to follow up regarding patient's oral chemotherapy medication: _Ibrance_  Pt is doing well today. No major side effects. Pt reports mild fatigue. She is currently day 23 of cycle 1 and off Ibrance for the week. Her ANC on day 21 was low at 1.0 but should hopefully recover by next week. She has lab appointment next week prior to starting cycle 2 if counts have recovered.  She is able to complete regular activities and go out on her errands. Will call again in 2 weeks and if stable will follow up monthly.   Pt reports _0__ tablets/doses missed in the last week  Pt reports the following side effects: _mild fatigue__    Will follow up and call patient again in _2 weeks____   Thank you,  Montel Clock, PharmD, [facility]

## 2015-04-01 ENCOUNTER — Other Ambulatory Visit: Payer: Self-pay | Admitting: Nurse Practitioner

## 2015-04-01 ENCOUNTER — Other Ambulatory Visit: Payer: Self-pay | Admitting: *Deleted

## 2015-04-01 DIAGNOSIS — C7951 Secondary malignant neoplasm of bone: Principal | ICD-10-CM

## 2015-04-01 DIAGNOSIS — C50919 Malignant neoplasm of unspecified site of unspecified female breast: Secondary | ICD-10-CM

## 2015-04-02 ENCOUNTER — Other Ambulatory Visit: Payer: Self-pay | Admitting: *Deleted

## 2015-04-02 ENCOUNTER — Other Ambulatory Visit (HOSPITAL_BASED_OUTPATIENT_CLINIC_OR_DEPARTMENT_OTHER): Payer: 59

## 2015-04-02 ENCOUNTER — Telehealth: Payer: Self-pay | Admitting: Oncology

## 2015-04-02 ENCOUNTER — Ambulatory Visit (HOSPITAL_BASED_OUTPATIENT_CLINIC_OR_DEPARTMENT_OTHER): Payer: 59 | Admitting: Oncology

## 2015-04-02 VITALS — BP 140/68 | HR 90 | Temp 98.3°F | Resp 18 | Ht 63.0 in | Wt 218.5 lb

## 2015-04-02 DIAGNOSIS — D6959 Other secondary thrombocytopenia: Secondary | ICD-10-CM

## 2015-04-02 DIAGNOSIS — C50912 Malignant neoplasm of unspecified site of left female breast: Secondary | ICD-10-CM

## 2015-04-02 DIAGNOSIS — M069 Rheumatoid arthritis, unspecified: Secondary | ICD-10-CM

## 2015-04-02 DIAGNOSIS — D701 Agranulocytosis secondary to cancer chemotherapy: Secondary | ICD-10-CM | POA: Diagnosis not present

## 2015-04-02 DIAGNOSIS — E119 Type 2 diabetes mellitus without complications: Secondary | ICD-10-CM

## 2015-04-02 DIAGNOSIS — C7951 Secondary malignant neoplasm of bone: Secondary | ICD-10-CM

## 2015-04-02 DIAGNOSIS — C50919 Malignant neoplasm of unspecified site of unspecified female breast: Secondary | ICD-10-CM

## 2015-04-02 DIAGNOSIS — I10 Essential (primary) hypertension: Secondary | ICD-10-CM

## 2015-04-02 DIAGNOSIS — E785 Hyperlipidemia, unspecified: Secondary | ICD-10-CM

## 2015-04-02 DIAGNOSIS — M545 Low back pain: Secondary | ICD-10-CM

## 2015-04-02 LAB — COMPREHENSIVE METABOLIC PANEL
ALBUMIN: 3.8 g/dL (ref 3.5–5.0)
ALK PHOS: 143 U/L (ref 40–150)
ALT: 18 U/L (ref 0–55)
AST: 21 U/L (ref 5–34)
Anion Gap: 9 mEq/L (ref 3–11)
BUN: 24.4 mg/dL (ref 7.0–26.0)
CO2: 24 mEq/L (ref 22–29)
Calcium: 9.7 mg/dL (ref 8.4–10.4)
Chloride: 107 mEq/L (ref 98–109)
Creatinine: 1.3 mg/dL — ABNORMAL HIGH (ref 0.6–1.1)
EGFR: 42 mL/min/{1.73_m2} — ABNORMAL LOW (ref >90)
GLUCOSE: 65 mg/dL — AB (ref 70–140)
Potassium: 4.3 mEq/L (ref 3.5–5.1)
SODIUM: 140 meq/L (ref 136–145)
TOTAL PROTEIN: 7.5 g/dL (ref 6.4–8.3)

## 2015-04-02 LAB — CBC WITH DIFFERENTIAL/PLATELET
BASO%: 0.6 % (ref 0.0–2.0)
Basophils Absolute: 0 10*3/uL (ref 0.0–0.1)
EOS ABS: 0.1 10*3/uL (ref 0.0–0.5)
EOS%: 3.2 % (ref 0.0–7.0)
HCT: 33.6 % — ABNORMAL LOW (ref 34.8–46.6)
HEMOGLOBIN: 11.1 g/dL — AB (ref 11.6–15.9)
LYMPH%: 26.5 % (ref 14.0–49.7)
MCH: 30.7 pg (ref 25.1–34.0)
MCHC: 33 g/dL (ref 31.5–36.0)
MCV: 92.8 fL (ref 79.5–101.0)
MONO#: 0.2 10*3/uL (ref 0.1–0.9)
MONO%: 12.3 % (ref 0.0–14.0)
NEUT%: 57.4 % (ref 38.4–76.8)
NEUTROS ABS: 0.9 10*3/uL — AB (ref 1.5–6.5)
Platelets: 63 10*3/uL — ABNORMAL LOW (ref 145–400)
RBC: 3.62 10*6/uL — ABNORMAL LOW (ref 3.70–5.45)
RDW: 14.3 % (ref 11.2–14.5)
WBC: 1.6 10*3/uL — AB (ref 3.9–10.3)
lymph#: 0.4 10*3/uL — ABNORMAL LOW (ref 0.9–3.3)

## 2015-04-02 MED ORDER — PALBOCICLIB 100 MG PO CAPS: 100.0000 mg | ORAL_CAPSULE | Freq: Every day | ORAL | Status: DC

## 2015-04-02 NOTE — Telephone Encounter (Signed)
Ibrance dose decreased to 100 mg per order of Dr. Benay Spice.

## 2015-04-02 NOTE — Telephone Encounter (Signed)
per pof to sch pt appt-gave pt copy of avs °

## 2015-04-02 NOTE — Progress Notes (Signed)
Glucose 65 this AM.  Pt given oj, graham crackers and peanut butter to eat. Pt is without complaints at this time and plans to call Dr. Deatra Ina today with glucose levels.

## 2015-04-02 NOTE — Progress Notes (Signed)
  [place] OFFICE PROGRESS NOTE   Diagnosis: Breast cancer  INTERVAL HISTORY:   [name] returns as scheduled. She reports stable back pain. The rheumatoid arthritis has been worse with the cold weather. She reports constipation and mild nausea when taking Ibrance. She received xgeva and Faslodex last week.  Objective:  Vital signs in last 24 hours:  Blood pressure 140/68, pulse 90, temperature 98.3 F (36.8 C), temperature source Oral, resp. rate 18, height _0  (1.6 m), weight 218 lb 8 oz (99.111 kg), SpO2 99 %.    HEENT: No thrush or ulcers Resp: Lungs clear bilaterally Cardio: Regular rate and rhythm GI: No hepatosplenomegaly, tender at the right costal margin Vascular: No leg edema       Lab Results:  Lab Results  Component Value Date   WBC 1.6* 04/02/2015   HGB 11.1* 04/02/2015   HCT 33.6* 04/02/2015   MCV 92.8 04/02/2015   PLT 63* 04/02/2015   NEUTROABS 0.9* 04/02/2015    Medications: I have reviewed the patient's current medications.  Assessment/Plan: 1. 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 ER positive, PR positive, HER-2 negative She completed 4 cycles of adjuvant AC chemotherapy June 15 through September 03, 2008, and then completed the left chest wall radiation. She began Arimidex following an office visit on November 11, 2008.  Bone scan 06/01/2013 suggestive of thoracic spine metastases, thoracic MRI 06/27/2011 consistent with multiple bone metastases involving the thoracolumbar spine   PET scan 07/09/2013 with multiple hypermetabolic bone lesions and hypermetabolic mediastinal nodes.   Left iliac lesion biopsy 07/26/2013. Pathology showed metastatic carcinoma, ER positive, PR positive, HER-2 negative   Initiation of Faslodex 08/10/2013.  Xgeva every 3 months initiated 07/27/2013.  Restaging  PET scan 01/23/2014 with no residual hypermetabolic mediastinal disease; marked improvement in the metastatic bone disease.  Restaging PET scan 02/17/2015 with recurrence of skeletal metastasis. New hypermetabolic lesions within the pelvis and ribs. No hypermetabolic lymphadenopathy.  Continuation of Faslodex; initiation of Ibrance 03/05/2015 2. Left chest subcutaneous mass noted on exam October 04, 2008, status post biopsy by Dr. Brantley Stage with a benign pathology. 3. History of delayed healing of the left mastectomy incision. 4. Rheumatoid arthritis. 5. Diabetes. 6. Hypercholesterolemia. 7. Hypertension. 8. Family history of breast cancer. She has been evaluated at the genetic screening clinic. 9. 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 10. Neutropenia/thrombocytopenia secondary to Ibrance my dose reduction planned with cycle 2     Disposition:  Her overall status appears unchanged. She has persistent neutropenia/thrombocytopenia following cycle 1 Ibrance.  She will return for a CBC prior to beginning cycle 2 on 04/09/2015. The [facility] will be dose reduced with cycle 2.  [name] will see Dr. Deatra Ina to discuss recent episodes of hypoglycemia. She will return for an office visit here following cycle 2 Vanita Panda, MD  04/02/2015  11:36 AM

## 2015-04-02 NOTE — Telephone Encounter (Signed)
Faxed request records to [facility] with [employer] [phone] release id)

## 2015-04-03 ENCOUNTER — Telehealth: Payer: Self-pay | Admitting: *Deleted

## 2015-04-03 ENCOUNTER — Other Ambulatory Visit: Payer: Self-pay | Admitting: *Deleted

## 2015-04-03 DIAGNOSIS — C50919 Malignant neoplasm of unspecified site of unspecified female breast: Secondary | ICD-10-CM

## 2015-04-03 DIAGNOSIS — C7951 Secondary malignant neoplasm of bone: Principal | ICD-10-CM

## 2015-04-03 MED ORDER — PALBOCICLIB 100 MG PO CAPS: 100.0000 mg | ORAL_CAPSULE | Freq: Every day | ORAL | Status: DC

## 2015-04-03 MED FILL — *IBRANCE 100 MG CAPSULE: 100 | 28 days supply | Qty: 21 | Fill #0

## 2015-04-03 NOTE — Telephone Encounter (Signed)
Ibrance script mistakenly sent to [facility] in [place],  Prescription resent to [facility] and cancelled at [facility].  Notified pt of change.

## 2015-04-03 NOTE — Telephone Encounter (Signed)
"  My Ibrance refill was [facility] to [facility] local [facility] and should go to [facility]."  Apologized for any inconvenience.  Order re-sent at this time.

## 2015-04-04 ENCOUNTER — Encounter: Payer: Self-pay | Admitting: Pharmacist

## 2015-04-04 NOTE — Progress Notes (Signed)
Oral Chemotherapy Follow-Up Form  Original Start date of oral chemotherapy: _2/15/2017_   Called patient today to follow up regarding patient's oral chemotherapy medication: _Ibrance_  Pt is doing well today. No major side effects. Pt reports mild fatigue. But overall doing great. No missed doses. Follow up in 1 month Pt reports _0__ tablets/doses missed in the last 2 weeks  Pt reports the following side effects: _mild fatigue__    Will follow up and call patient again in _4 weeks____   Thank you,  Montel Clock, PharmD, [facility]

## 2015-04-09 ENCOUNTER — Telehealth: Payer: Self-pay | Admitting: Oncology

## 2015-04-09 ENCOUNTER — Telehealth: Payer: Self-pay | Admitting: *Deleted

## 2015-04-09 ENCOUNTER — Other Ambulatory Visit (HOSPITAL_BASED_OUTPATIENT_CLINIC_OR_DEPARTMENT_OTHER): Payer: 59

## 2015-04-09 DIAGNOSIS — C50912 Malignant neoplasm of unspecified site of left female breast: Secondary | ICD-10-CM

## 2015-04-09 DIAGNOSIS — C7951 Secondary malignant neoplasm of bone: Secondary | ICD-10-CM | POA: Diagnosis not present

## 2015-04-09 LAB — CBC WITH DIFFERENTIAL/PLATELET
BASO%: 0.4 % (ref 0.0–2.0)
Basophils Absolute: 0 10*3/uL (ref 0.0–0.1)
EOS%: 2.2 % (ref 0.0–7.0)
Eosinophils Absolute: 0.1 10*3/uL (ref 0.0–0.5)
HCT: 34.6 % — ABNORMAL LOW (ref 34.8–46.6)
HEMOGLOBIN: 11.2 g/dL — AB (ref 11.6–15.9)
LYMPH%: 24 % (ref 14.0–49.7)
MCH: 30.4 pg (ref 25.1–34.0)
MCHC: 32.4 g/dL (ref 31.5–36.0)
MCV: 94 fL (ref 79.5–101.0)
MONO#: 0.7 10*3/uL (ref 0.1–0.9)
MONO%: 26.2 % — AB (ref 0.0–14.0)
NEUT%: 47.2 % (ref 38.4–76.8)
NEUTROS ABS: 1.3 10*3/uL — AB (ref 1.5–6.5)
PLATELETS: 155 10*3/uL (ref 145–400)
RBC: 3.68 10*6/uL — ABNORMAL LOW (ref 3.70–5.45)
RDW: 15.8 % — AB (ref 11.2–14.5)
WBC: 2.7 10*3/uL — AB (ref 3.9–10.3)
lymph#: 0.6 10*3/uL — ABNORMAL LOW (ref 0.9–3.3)

## 2015-04-09 NOTE — Telephone Encounter (Signed)
s.w pt and advised on 3.31 appt.....pt ok and aware °

## 2015-04-09 NOTE — Telephone Encounter (Signed)
Spoke with pt in lobby, WBC  better. OK to start Cycle 2 of Ibrance today- per Dr. Benay Spice. This cycle is dose reduced. Per MD: Check CBC in 10 days.

## 2015-04-18 ENCOUNTER — Telehealth: Payer: Self-pay | Admitting: *Deleted

## 2015-04-18 ENCOUNTER — Other Ambulatory Visit (HOSPITAL_BASED_OUTPATIENT_CLINIC_OR_DEPARTMENT_OTHER): Payer: 59

## 2015-04-18 DIAGNOSIS — C50912 Malignant neoplasm of unspecified site of left female breast: Secondary | ICD-10-CM | POA: Diagnosis not present

## 2015-04-18 DIAGNOSIS — C7951 Secondary malignant neoplasm of bone: Secondary | ICD-10-CM | POA: Diagnosis not present

## 2015-04-18 LAB — CBC WITH DIFFERENTIAL/PLATELET
BASO%: 0 % (ref 0.0–2.0)
Basophils Absolute: 0 10*3/uL (ref 0.0–0.1)
EOS ABS: 0.1 10*3/uL (ref 0.0–0.5)
EOS%: 2.6 % (ref 0.0–7.0)
HCT: 34.5 % — ABNORMAL LOW (ref 34.8–46.6)
HGB: 11 g/dL — ABNORMAL LOW (ref 11.6–15.9)
LYMPH%: 18.3 % (ref 14.0–49.7)
MCH: 30.6 pg (ref 25.1–34.0)
MCHC: 31.9 g/dL (ref 31.5–36.0)
MCV: 95.8 fL (ref 79.5–101.0)
MONO#: 0.2 10*3/uL (ref 0.1–0.9)
MONO%: 7.1 % (ref 0.0–14.0)
NEUT%: 72 % (ref 38.4–76.8)
NEUTROS ABS: 2.3 10*3/uL (ref 1.5–6.5)
Platelets: 185 10*3/uL (ref 145–400)
RBC: 3.6 10*6/uL — AB (ref 3.70–5.45)
RDW: 16.3 % — ABNORMAL HIGH (ref 11.2–14.5)
WBC: 3.1 10*3/uL — AB (ref 3.9–10.3)
lymph#: 0.6 10*3/uL — ABNORMAL LOW (ref 0.9–3.3)

## 2015-04-18 NOTE — Telephone Encounter (Signed)
-----   Message from Ladell Pier, MD sent at 04/18/2015  4:20 PM EDT ----- Please call patient, white count looks good, continue Ibrance

## 2015-04-18 NOTE — Telephone Encounter (Signed)
Per Dr. Benay Spice, pt notified that white count looks good and to continue Ibrance.  Pt appreciative of call and has no questions or concerns at this time.

## 2015-04-24 ENCOUNTER — Telehealth: Payer: Self-pay | Admitting: Oncology

## 2015-04-24 ENCOUNTER — Ambulatory Visit (HOSPITAL_BASED_OUTPATIENT_CLINIC_OR_DEPARTMENT_OTHER): Payer: 59

## 2015-04-24 ENCOUNTER — Other Ambulatory Visit (HOSPITAL_BASED_OUTPATIENT_CLINIC_OR_DEPARTMENT_OTHER): Payer: 59

## 2015-04-24 ENCOUNTER — Ambulatory Visit (HOSPITAL_BASED_OUTPATIENT_CLINIC_OR_DEPARTMENT_OTHER): Payer: 59 | Admitting: Nurse Practitioner

## 2015-04-24 VITALS — BP 136/52 | HR 84 | Temp 97.8°F | Resp 18 | Ht 63.0 in | Wt 219.5 lb

## 2015-04-24 DIAGNOSIS — C7951 Secondary malignant neoplasm of bone: Secondary | ICD-10-CM

## 2015-04-24 DIAGNOSIS — E119 Type 2 diabetes mellitus without complications: Secondary | ICD-10-CM

## 2015-04-24 DIAGNOSIS — C50919 Malignant neoplasm of unspecified site of unspecified female breast: Secondary | ICD-10-CM

## 2015-04-24 DIAGNOSIS — D701 Agranulocytosis secondary to cancer chemotherapy: Secondary | ICD-10-CM

## 2015-04-24 DIAGNOSIS — R110 Nausea: Secondary | ICD-10-CM

## 2015-04-24 DIAGNOSIS — C50912 Malignant neoplasm of unspecified site of left female breast: Secondary | ICD-10-CM

## 2015-04-24 DIAGNOSIS — R52 Pain, unspecified: Secondary | ICD-10-CM

## 2015-04-24 DIAGNOSIS — I10 Essential (primary) hypertension: Secondary | ICD-10-CM

## 2015-04-24 DIAGNOSIS — Z5111 Encounter for antineoplastic chemotherapy: Secondary | ICD-10-CM

## 2015-04-24 DIAGNOSIS — D6959 Other secondary thrombocytopenia: Secondary | ICD-10-CM | POA: Diagnosis not present

## 2015-04-24 LAB — CBC WITH DIFFERENTIAL/PLATELET
BASO%: 0.1 % (ref 0.0–2.0)
Basophils Absolute: 0 10*3/uL (ref 0.0–0.1)
EOS ABS: 0.1 10*3/uL (ref 0.0–0.5)
EOS%: 3.9 % (ref 0.0–7.0)
HCT: 33 % — ABNORMAL LOW (ref 34.8–46.6)
HGB: 10.7 g/dL — ABNORMAL LOW (ref 11.6–15.9)
LYMPH%: 19.4 % (ref 14.0–49.7)
MCH: 31.1 pg (ref 25.1–34.0)
MCHC: 32.5 g/dL (ref 31.5–36.0)
MCV: 95.9 fL (ref 79.5–101.0)
MONO#: 0.2 10*3/uL (ref 0.1–0.9)
MONO%: 8.6 % (ref 0.0–14.0)
NEUT%: 68 % (ref 38.4–76.8)
NEUTROS ABS: 1.7 10*3/uL (ref 1.5–6.5)
PLATELETS: 150 10*3/uL (ref 145–400)
RBC: 3.44 10*6/uL — AB (ref 3.70–5.45)
RDW: 17.7 % — ABNORMAL HIGH (ref 11.2–14.5)
WBC: 2.5 10*3/uL — AB (ref 3.9–10.3)
lymph#: 0.5 10*3/uL — ABNORMAL LOW (ref 0.9–3.3)

## 2015-04-24 MED ORDER — ALPRAZOLAM 0.5 MG PO TABS: 0.5000 mg | ORAL_TABLET | Freq: Two times a day (BID) | ORAL | Status: DC | PRN

## 2015-04-24 MED ORDER — OXYCODONE-ACETAMINOPHEN 10-325 MG PO TABS: 1.0000 | ORAL_TABLET | ORAL | Status: DC | PRN

## 2015-04-24 MED ORDER — FULVESTRANT 250 MG/5ML IM SOLN
500.0000 mg | Freq: Once | INTRAMUSCULAR | Status: AC
  Administered 2015-04-24: 500 mg via INTRAMUSCULAR
  Filled 2015-04-24: qty 10

## 2015-04-24 NOTE — Progress Notes (Signed)
  [place] OFFICE PROGRESS NOTE   Diagnosis:  Breast cancer  INTERVAL HISTORY:   [name] returns as scheduled. She began cycle 2 Ibrance on 04/09/2015. She has intermittent nausea. No mouth sores. She has had recent urgency with bowel movements. No urinary symptoms. Pain is unchanged. She continues Percocet as needed. Main complaint is being tired.  Objective:  Vital signs in last 24 hours:  Blood pressure 136/52, pulse 84, temperature 97.8 F (36.6 C), temperature source Oral, resp. rate 18, height '5\' 3"'$  (1.6 m), weight 219 lb 8 oz (99.565 kg), SpO2 98 %.    HEENT: No thrush or ulcers. Resp: Lungs clear bilaterally. Cardio: Regular rate and rhythm. GI: Abdomen soft and nontender. No hepatomegaly. Vascular: No leg edema.  Lab Results:  Lab Results  Component Value Date   WBC 2.5* 04/24/2015   HGB 10.7* 04/24/2015   HCT 33.0* 04/24/2015   MCV 95.9 04/24/2015   PLT 150 04/24/2015   NEUTROABS 1.7 04/24/2015    Imaging:  No results found.  Medications: I have reviewed the patient's current medications.  Assessment/Plan: 1. 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 ER positive, PR positive, HER-2 negative She completed 4 cycles of adjuvant AC chemotherapy June 15 through September 03, 2008, and then completed the left chest wall radiation. She began Arimidex following an office visit on November 11, 2008.  Bone scan 06/01/2013 suggestive of thoracic spine metastases, thoracic MRI 06/27/2011 consistent with multiple bone metastases involving the thoracolumbar spine   PET scan 07/09/2013 with multiple hypermetabolic bone lesions and hypermetabolic mediastinal nodes.   Left iliac lesion biopsy 07/26/2013. Pathology showed metastatic carcinoma, ER positive, PR positive, HER-2 negative   Initiation of Faslodex  08/10/2013.  Xgeva every 3 months initiated 07/27/2013.  Restaging PET scan 01/23/2014 with no residual hypermetabolic mediastinal disease; marked improvement in the metastatic bone disease.  Restaging PET scan 02/17/2015 with recurrence of skeletal metastasis. New hypermetabolic lesions within the pelvis and ribs. No hypermetabolic lymphadenopathy.  Continuation of Faslodex; initiation of Ibrance 03/05/2015  Cycle 2 Ibrance 04/09/2015 2. Left chest subcutaneous mass noted on exam October 04, 2008, status post biopsy by Dr. Brantley Stage with a benign pathology. 3. History of delayed healing of the left mastectomy incision. 4. Rheumatoid arthritis. 5. Diabetes. 6. Hypercholesterolemia. 7. Hypertension. 8. Family history of breast cancer. She has been evaluated at the genetic screening clinic. 9. 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 10. 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 appears unchanged. She is completing cycle 2 Ibrance. She is due for Faslodex today.  She will return for a follow-up CBC prior to beginning cycle 3 Ibrance on 05/07/2015.  We scheduled a return visit, labs and Faslodex in one month. She will contact the office in the interim with any problems.    [name] ANP/GNP-BC   04/24/2015  12:27 PM

## 2015-04-24 NOTE — Telephone Encounter (Signed)
Gave and printed appt sched and avs for pt for April and May °

## 2015-04-29 ENCOUNTER — Telehealth: Payer: Self-pay | Admitting: Pharmacist

## 2015-04-29 NOTE — Telephone Encounter (Signed)
04/29/15: Attempted to reach patient for follow up on oral medication: Ibrance. No answer. Left VM for patient to call back with any questions or issues.   Thank you,  Montel Clock, PharmD, [facility] [phone]

## 2015-05-02 ENCOUNTER — Other Ambulatory Visit: Payer: Self-pay | Admitting: *Deleted

## 2015-05-02 DIAGNOSIS — C50912 Malignant neoplasm of unspecified site of left female breast: Secondary | ICD-10-CM

## 2015-05-06 ENCOUNTER — Ambulatory Visit: Payer: 59

## 2015-05-06 ENCOUNTER — Other Ambulatory Visit (HOSPITAL_BASED_OUTPATIENT_CLINIC_OR_DEPARTMENT_OTHER): Payer: 59

## 2015-05-06 DIAGNOSIS — C50919 Malignant neoplasm of unspecified site of unspecified female breast: Secondary | ICD-10-CM

## 2015-05-06 DIAGNOSIS — C7951 Secondary malignant neoplasm of bone: Secondary | ICD-10-CM

## 2015-05-06 DIAGNOSIS — C50912 Malignant neoplasm of unspecified site of left female breast: Secondary | ICD-10-CM

## 2015-05-06 LAB — COMPREHENSIVE METABOLIC PANEL
ALBUMIN: 3.6 g/dL (ref 3.5–5.0)
ALK PHOS: 123 U/L (ref 40–150)
ALT: 13 U/L (ref 0–55)
AST: 18 U/L (ref 5–34)
Anion Gap: 10 mEq/L (ref 3–11)
BUN: 40.1 mg/dL — AB (ref 7.0–26.0)
CALCIUM: 9.8 mg/dL (ref 8.4–10.4)
CO2: 21 mEq/L — ABNORMAL LOW (ref 22–29)
CREATININE: 1.4 mg/dL — AB (ref 0.6–1.1)
Chloride: 109 mEq/L (ref 98–109)
EGFR: 39 mL/min/{1.73_m2} — ABNORMAL LOW (ref >90)
Glucose: 70 mg/dl (ref 70–140)
Potassium: 4.4 mEq/L (ref 3.5–5.1)
Sodium: 141 mEq/L (ref 136–145)
Total Bilirubin: <0.3 mg/dL (ref 0.20–1.20)
Total Protein: 7.4 g/dL (ref 6.4–8.3)

## 2015-05-06 LAB — CBC WITH DIFFERENTIAL/PLATELET
BASO%: 0.4 % (ref 0.0–2.0)
BASOS ABS: 0 10*3/uL (ref 0.0–0.1)
EOS%: 2.3 % (ref 0.0–7.0)
Eosinophils Absolute: 0 10*3/uL (ref 0.0–0.5)
HEMATOCRIT: 34.1 % — AB (ref 34.8–46.6)
HEMOGLOBIN: 11.1 g/dL — AB (ref 11.6–15.9)
LYMPH#: 0.4 10*3/uL — AB (ref 0.9–3.3)
LYMPH%: 23.4 % (ref 14.0–49.7)
MCH: 31.6 pg (ref 25.1–34.0)
MCHC: 32.7 g/dL (ref 31.5–36.0)
MCV: 96.8 fL (ref 79.5–101.0)
MONO#: 0.3 10*3/uL (ref 0.1–0.9)
MONO%: 15.2 % — ABNORMAL HIGH (ref 0.0–14.0)
NEUT%: 58.7 % (ref 38.4–76.8)
NEUTROS ABS: 1 10*3/uL — AB (ref 1.5–6.5)
Platelets: 122 10*3/uL — ABNORMAL LOW (ref 145–400)
RBC: 3.52 10*6/uL — ABNORMAL LOW (ref 3.70–5.45)
RDW: 18.6 % — AB (ref 11.2–14.5)
WBC: 1.7 10*3/uL — ABNORMAL LOW (ref 3.9–10.3)

## 2015-05-07 ENCOUNTER — Telehealth: Payer: Self-pay | Admitting: Oncology

## 2015-05-07 ENCOUNTER — Telehealth: Payer: Self-pay | Admitting: *Deleted

## 2015-05-07 DIAGNOSIS — C50919 Malignant neoplasm of unspecified site of unspecified female breast: Secondary | ICD-10-CM

## 2015-05-07 DIAGNOSIS — C7951 Secondary malignant neoplasm of bone: Principal | ICD-10-CM

## 2015-05-07 MED ORDER — PALBOCICLIB 100 MG PO CAPS: 100.0000 mg | ORAL_CAPSULE | Freq: Every day | ORAL | Status: DC

## 2015-05-07 MED FILL — *IBRANCE 100 MG CAPSULE: 100 | 21 days supply | Qty: 21 | Fill #0

## 2015-05-07 NOTE — Telephone Encounter (Addendum)
Called pt, informed her refill has been sent to [facility]. OK to start next cycle of Ibrance. Will check lab 4/28. She voiced understanding, plans to pick it up today. Spoke with Brayton Layman at [facility]: Ibrance 100 mg will be available for pick up 4/20. They have informed pt.

## 2015-05-07 NOTE — Telephone Encounter (Signed)
-----   Message from Owens Shark, NP sent at 05/07/2015  1:43 PM EDT ----- Please let her know ok to start next cycle of Ibrance, repeat cbc 10 days

## 2015-05-07 NOTE — Telephone Encounter (Signed)
spoke w/ pt confirmed added lab for 4/28

## 2015-05-07 NOTE — Telephone Encounter (Signed)
"  I didn't receive a call about yesterday's lab results.  Today is the day I am to resume Ibrance and haven't heard from the pharmacy for a refill." Advised on lab results and to hold Ibrance until further notice.  Asked about platelet count.  Advised on neutropenic and low platelet precautions.  Return number [phone].

## 2015-05-16 ENCOUNTER — Other Ambulatory Visit (HOSPITAL_BASED_OUTPATIENT_CLINIC_OR_DEPARTMENT_OTHER): Payer: 59

## 2015-05-16 DIAGNOSIS — C7951 Secondary malignant neoplasm of bone: Secondary | ICD-10-CM

## 2015-05-16 DIAGNOSIS — C50919 Malignant neoplasm of unspecified site of unspecified female breast: Secondary | ICD-10-CM

## 2015-05-16 DIAGNOSIS — C50912 Malignant neoplasm of unspecified site of left female breast: Secondary | ICD-10-CM | POA: Diagnosis not present

## 2015-05-16 LAB — CBC WITH DIFFERENTIAL/PLATELET
BASO%: 0 % (ref 0.0–2.0)
BASOS ABS: 0 10*3/uL (ref 0.0–0.1)
EOS%: 3.4 % (ref 0.0–7.0)
Eosinophils Absolute: 0.1 10*3/uL (ref 0.0–0.5)
HEMATOCRIT: 32.9 % — AB (ref 34.8–46.6)
HEMOGLOBIN: 10.7 g/dL — AB (ref 11.6–15.9)
LYMPH#: 0.5 10*3/uL — AB (ref 0.9–3.3)
LYMPH%: 26.1 % (ref 14.0–49.7)
MCH: 31.9 pg (ref 25.1–34.0)
MCHC: 32.5 g/dL (ref 31.5–36.0)
MCV: 98.2 fL (ref 79.5–101.0)
MONO#: 0.2 10*3/uL (ref 0.1–0.9)
MONO%: 9.2 % (ref 0.0–14.0)
NEUT#: 1.3 10*3/uL — ABNORMAL LOW (ref 1.5–6.5)
NEUT%: 61.3 % (ref 38.4–76.8)
PLATELETS: 176 10*3/uL (ref 145–400)
RBC: 3.35 10*6/uL — ABNORMAL LOW (ref 3.70–5.45)
RDW: 17.1 % — AB (ref 11.2–14.5)
WBC: 2.1 10*3/uL — ABNORMAL LOW (ref 3.9–10.3)

## 2015-05-20 ENCOUNTER — Telehealth: Payer: Self-pay

## 2015-05-20 NOTE — Telephone Encounter (Signed)
Called and informed patient of lab results. Informed her to continue her ibrance and follow up as scheduled. Patient to come in Thursday 5/4 at 11am for lab/md app/ inj. patient verbalized understanding

## 2015-05-20 NOTE — Telephone Encounter (Signed)
-----   Message from Owens Shark, NP sent at 05/20/2015 10:10 AM EDT ----- Please let her know we've reviewed the labs from last week. The neutrophil count is better. Continue Ibrance and follow-up as scheduled.

## 2015-05-22 ENCOUNTER — Telehealth: Payer: Self-pay | Admitting: Oncology

## 2015-05-22 ENCOUNTER — Other Ambulatory Visit: Payer: Self-pay | Admitting: *Deleted

## 2015-05-22 ENCOUNTER — Other Ambulatory Visit (HOSPITAL_BASED_OUTPATIENT_CLINIC_OR_DEPARTMENT_OTHER): Payer: 59

## 2015-05-22 ENCOUNTER — Ambulatory Visit (HOSPITAL_BASED_OUTPATIENT_CLINIC_OR_DEPARTMENT_OTHER): Payer: 59

## 2015-05-22 ENCOUNTER — Ambulatory Visit (HOSPITAL_BASED_OUTPATIENT_CLINIC_OR_DEPARTMENT_OTHER): Payer: 59 | Admitting: Oncology

## 2015-05-22 VITALS — BP 112/71 | HR 78 | Temp 97.5°F | Resp 18 | Ht 63.0 in | Wt 215.3 lb

## 2015-05-22 DIAGNOSIS — C7951 Secondary malignant neoplasm of bone: Secondary | ICD-10-CM

## 2015-05-22 DIAGNOSIS — C50912 Malignant neoplasm of unspecified site of left female breast: Secondary | ICD-10-CM | POA: Diagnosis not present

## 2015-05-22 DIAGNOSIS — Z5111 Encounter for antineoplastic chemotherapy: Secondary | ICD-10-CM

## 2015-05-22 DIAGNOSIS — M545 Low back pain: Secondary | ICD-10-CM

## 2015-05-22 DIAGNOSIS — C50919 Malignant neoplasm of unspecified site of unspecified female breast: Secondary | ICD-10-CM

## 2015-05-22 DIAGNOSIS — E119 Type 2 diabetes mellitus without complications: Secondary | ICD-10-CM

## 2015-05-22 DIAGNOSIS — I10 Essential (primary) hypertension: Secondary | ICD-10-CM

## 2015-05-22 DIAGNOSIS — M069 Rheumatoid arthritis, unspecified: Secondary | ICD-10-CM | POA: Diagnosis not present

## 2015-05-22 LAB — CBC WITH DIFFERENTIAL/PLATELET
BASO%: 0.4 % (ref 0.0–2.0)
BASOS ABS: 0 10*3/uL (ref 0.0–0.1)
EOS ABS: 0.1 10*3/uL (ref 0.0–0.5)
EOS%: 5.1 % (ref 0.0–7.0)
HEMATOCRIT: 33.3 % — AB (ref 34.8–46.6)
HGB: 10.8 g/dL — ABNORMAL LOW (ref 11.6–15.9)
LYMPH#: 0.4 10*3/uL — AB (ref 0.9–3.3)
LYMPH%: 19.4 % (ref 14.0–49.7)
MCH: 32.1 pg (ref 25.1–34.0)
MCHC: 32.6 g/dL (ref 31.5–36.0)
MCV: 98.4 fL (ref 79.5–101.0)
MONO#: 0.2 10*3/uL (ref 0.1–0.9)
MONO%: 8.8 % (ref 0.0–14.0)
NEUT#: 1.4 10*3/uL — ABNORMAL LOW (ref 1.5–6.5)
NEUT%: 66.3 % (ref 38.4–76.8)
PLATELETS: 192 10*3/uL (ref 145–400)
RBC: 3.38 10*6/uL — AB (ref 3.70–5.45)
RDW: 19.2 % — ABNORMAL HIGH (ref 11.2–14.5)
WBC: 2.1 10*3/uL — ABNORMAL LOW (ref 3.9–10.3)

## 2015-05-22 LAB — COMPREHENSIVE METABOLIC PANEL
ALT: 12 U/L (ref 0–55)
ANION GAP: 10 meq/L (ref 3–11)
AST: 17 U/L (ref 5–34)
Albumin: 3.8 g/dL (ref 3.5–5.0)
Alkaline Phosphatase: 119 U/L (ref 40–150)
BUN: 37.3 mg/dL — ABNORMAL HIGH (ref 7.0–26.0)
CALCIUM: 9.6 mg/dL (ref 8.4–10.4)
CHLORIDE: 107 meq/L (ref 98–109)
CO2: 24 meq/L (ref 22–29)
CREATININE: 1.7 mg/dL — AB (ref 0.6–1.1)
EGFR: 31 mL/min/{1.73_m2} — AB (ref >90)
Glucose: 100 mg/dl (ref 70–140)
Potassium: 5 mEq/L (ref 3.5–5.1)
Sodium: 140 mEq/L (ref 136–145)
Total Bilirubin: 0.31 mg/dL (ref 0.20–1.20)
Total Protein: 7.3 g/dL (ref 6.4–8.3)

## 2015-05-22 MED ORDER — ALPRAZOLAM 0.5 MG PO TABS: 0.5000 mg | ORAL_TABLET | Freq: Two times a day (BID) | ORAL | Status: DC | PRN

## 2015-05-22 MED ORDER — OXYCODONE-ACETAMINOPHEN 10-325 MG PO TABS: 1.0000 | ORAL_TABLET | ORAL | Status: DC | PRN

## 2015-05-22 MED ORDER — FULVESTRANT 250 MG/5ML IM SOLN
500.0000 mg | Freq: Once | INTRAMUSCULAR | Status: AC
  Administered 2015-05-22: 500 mg via INTRAMUSCULAR
  Filled 2015-05-22: qty 10

## 2015-05-22 NOTE — Progress Notes (Signed)
  [place] OFFICE PROGRESS NOTE   Diagnosis: Breast cancer  INTERVAL HISTORY:   [name] returns as scheduled. She began the most recent cycle of Ibrance on 05/08/2015. She last received Faslodex 04/24/2015. She reports stable back pain. No new complaint. She is no longer taking specific therapy for rheumatoid arthritis.  Objective:  Vital signs in last 24 hours:  Blood pressure 112/71, pulse 78, temperature 97.5 F (36.4 C), temperature source Oral, resp. rate 18, height '5\' 3"'$  (1.6 m), weight 215 lb 4.8 oz (97.659 kg), SpO2 100 %.    HEENT: No thrush or ulcers Resp: Lungs clear bilaterally Cardio: Regular rate and rhythm GI: No hepatomegaly, nontender Vascular: No leg edema  Skin: Status post left mastectomy-no evidence for chest wall tumor recurrence    Lab Results:  Lab Results  Component Value Date   WBC 2.1* 05/22/2015   HGB 10.8* 05/22/2015   HCT 33.3* 05/22/2015   MCV 98.4 05/22/2015   PLT 192 05/22/2015   NEUTROABS 1.4* 05/22/2015     Medications: I have reviewed the patient's current medications.  Assessment/Plan: 1. 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 ER positive, PR positive, HER-2 negative She completed 4 cycles of adjuvant AC chemotherapy June 15 through September 03, 2008, and then completed the left chest wall radiation. She began Arimidex following an office visit on November 11, 2008.  Bone scan 06/01/2013 suggestive of thoracic spine metastases, thoracic MRI 06/27/2011 consistent with multiple bone metastases involving the thoracolumbar spine   PET scan 07/09/2013 with multiple hypermetabolic bone lesions and hypermetabolic mediastinal nodes.   Left iliac lesion biopsy 07/26/2013. Pathology showed metastatic carcinoma, ER positive, PR positive, HER-2 negative   Initiation of Faslodex  08/10/2013.  Xgeva every 3 months initiated 07/27/2013.  Restaging PET scan 01/23/2014 with no residual hypermetabolic mediastinal disease; marked improvement in the metastatic bone disease.  Restaging PET scan 02/17/2015 with recurrence of skeletal metastasis. New hypermetabolic lesions within the pelvis and ribs. No hypermetabolic lymphadenopathy.  Continuation of Faslodex; initiation of Ibrance 03/05/2015  Cycle 2 Ibrance 04/09/2015-dose reduced to 100 mg daily  Cycle 3 Ibrance 05/08/2015 2. Left chest subcutaneous mass noted on exam October 04, 2008, status post biopsy by Dr. Brantley Stage with a benign pathology. 3. History of delayed healing of the left mastectomy incision. 4. Rheumatoid arthritis. 5. Diabetes. 6. Hypercholesterolemia. 7. Hypertension. 8. Family history of breast cancer. She has been evaluated at the genetic screening clinic. 9. 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 10. Neutropenia/thrombocytopenia secondary to Ibrance; dose reduction with cycle 2   Disposition:  [name] appears stable. She will begin cycle 4 Ibrance on 06/05/2015. She will return for a CBC on 06/04/2015.  [name] is scheduled for an office visit, Faslodex, and Xgeva on 06/26/2015.  Betsy Coder, MD  05/22/2015  11:18 AM

## 2015-05-22 NOTE — Telephone Encounter (Signed)
left msg confirming appt

## 2015-05-23 LAB — CANCER ANTIGEN 27.29: CAN 27.29: 34.3 U/mL (ref 0.0–38.6)

## 2015-05-23 LAB — CANCER ANTIGEN 27-29 (PARALLEL TESTING): CA 27.29: 38 U/mL — AB (ref <38)

## 2015-06-03 ENCOUNTER — Telehealth: Payer: Self-pay | Admitting: *Deleted

## 2015-06-03 DIAGNOSIS — C50919 Malignant neoplasm of unspecified site of unspecified female breast: Secondary | ICD-10-CM

## 2015-06-03 DIAGNOSIS — C7951 Secondary malignant neoplasm of bone: Principal | ICD-10-CM

## 2015-06-03 MED ORDER — PALBOCICLIB 100 MG PO CAPS: 100.0000 mg | ORAL_CAPSULE | Freq: Every day | ORAL | Status: DC

## 2015-06-03 NOTE — Telephone Encounter (Signed)
Message from pt requesting Ibrance refill. Next cycle due 5/18, pt will come in for lab 5/17. Refill sent electronically, per Dr. Benay Spice. Pt notified. She understands not to start Ibrance until we call with lab results.

## 2015-06-04 ENCOUNTER — Encounter: Payer: Self-pay | Admitting: Pharmacist

## 2015-06-04 ENCOUNTER — Other Ambulatory Visit: Payer: Self-pay | Admitting: Pharmacist

## 2015-06-04 ENCOUNTER — Telehealth: Payer: Self-pay | Admitting: Nurse Practitioner

## 2015-06-04 ENCOUNTER — Other Ambulatory Visit (HOSPITAL_BASED_OUTPATIENT_CLINIC_OR_DEPARTMENT_OTHER): Payer: 59

## 2015-06-04 ENCOUNTER — Telehealth: Payer: Self-pay | Admitting: *Deleted

## 2015-06-04 DIAGNOSIS — C7951 Secondary malignant neoplasm of bone: Principal | ICD-10-CM

## 2015-06-04 DIAGNOSIS — C50919 Malignant neoplasm of unspecified site of unspecified female breast: Secondary | ICD-10-CM

## 2015-06-04 DIAGNOSIS — C50912 Malignant neoplasm of unspecified site of left female breast: Secondary | ICD-10-CM | POA: Diagnosis not present

## 2015-06-04 LAB — CBC WITH DIFFERENTIAL/PLATELET
BASO%: 0.4 % (ref 0.0–2.0)
Basophils Absolute: 0 10*3/uL (ref 0.0–0.1)
EOS ABS: 0.1 10*3/uL (ref 0.0–0.5)
EOS%: 3.5 % (ref 0.0–7.0)
HCT: 31.3 % — ABNORMAL LOW (ref 34.8–46.6)
HGB: 10.1 g/dL — ABNORMAL LOW (ref 11.6–15.9)
LYMPH%: 23 % (ref 14.0–49.7)
MCH: 32 pg (ref 25.1–34.0)
MCHC: 32.2 g/dL (ref 31.5–36.0)
MCV: 99.5 fL (ref 79.5–101.0)
MONO#: 0.3 10*3/uL (ref 0.1–0.9)
MONO%: 17.2 % — AB (ref 0.0–14.0)
NEUT%: 55.9 % (ref 38.4–76.8)
NEUTROS ABS: 1 10*3/uL — AB (ref 1.5–6.5)
Platelets: 104 10*3/uL — ABNORMAL LOW (ref 145–400)
RBC: 3.15 10*6/uL — ABNORMAL LOW (ref 3.70–5.45)
RDW: 18.5 % — ABNORMAL HIGH (ref 11.2–14.5)
WBC: 1.7 10*3/uL — AB (ref 3.9–10.3)
lymph#: 0.4 10*3/uL — ABNORMAL LOW (ref 0.9–3.3)

## 2015-06-04 MED ORDER — PALBOCICLIB 100 MG PO CAPS: 100.0000 mg | ORAL_CAPSULE | Freq: Every day | ORAL | Status: DC

## 2015-06-04 NOTE — Telephone Encounter (Signed)
per pof to sch pt appt-cld & spoke to pt and gave pt time & date of appt °

## 2015-06-04 NOTE — Progress Notes (Signed)
06/04/15: [facility] is now requiring [facility] for [place] refills (first few fills allowed at [facility]). Ibrance Rx e-scribed to [place] with request to expedite. Patient will be in for labs today to verify ok to start next cycle.   Thank you,  Montel Clock, PharmD, [facility]

## 2015-06-04 NOTE — Telephone Encounter (Signed)
-----   Message from Ladell Pier, MD sent at 06/04/2015  3:39 PM EDT ----- Please call patient, hold the ibrance, repeat cbc 1 week

## 2015-06-04 NOTE — Telephone Encounter (Signed)
Called pt with instructions to hold Ibrance. Will check labs in one week. Neutropenic precautions discussed. She voiced understanding. Order to schedulers for appt.

## 2015-06-09 ENCOUNTER — Other Ambulatory Visit: Payer: Self-pay | Admitting: *Deleted

## 2015-06-09 DIAGNOSIS — C7951 Secondary malignant neoplasm of bone: Principal | ICD-10-CM

## 2015-06-09 DIAGNOSIS — C50912 Malignant neoplasm of unspecified site of left female breast: Secondary | ICD-10-CM

## 2015-06-11 ENCOUNTER — Telehealth: Payer: Self-pay | Admitting: *Deleted

## 2015-06-11 ENCOUNTER — Other Ambulatory Visit (HOSPITAL_BASED_OUTPATIENT_CLINIC_OR_DEPARTMENT_OTHER): Payer: 59

## 2015-06-11 DIAGNOSIS — C7951 Secondary malignant neoplasm of bone: Secondary | ICD-10-CM

## 2015-06-11 DIAGNOSIS — C50912 Malignant neoplasm of unspecified site of left female breast: Secondary | ICD-10-CM

## 2015-06-11 LAB — CBC WITH DIFFERENTIAL/PLATELET
BASO%: 0.5 % (ref 0.0–2.0)
BASOS ABS: 0 10*3/uL (ref 0.0–0.1)
EOS%: 2.1 % (ref 0.0–7.0)
Eosinophils Absolute: 0.1 10*3/uL (ref 0.0–0.5)
HCT: 32.2 % — ABNORMAL LOW (ref 34.8–46.6)
HEMOGLOBIN: 10.7 g/dL — AB (ref 11.6–15.9)
LYMPH%: 15.7 % (ref 14.0–49.7)
MCH: 33.1 pg (ref 25.1–34.0)
MCHC: 33.2 g/dL (ref 31.5–36.0)
MCV: 99.7 fL (ref 79.5–101.0)
MONO#: 0.5 10*3/uL (ref 0.1–0.9)
MONO%: 15.5 % — AB (ref 0.0–14.0)
NEUT#: 2.2 10*3/uL (ref 1.5–6.5)
NEUT%: 66.2 % (ref 38.4–76.8)
Platelets: 167 10*3/uL (ref 145–400)
RBC: 3.22 10*6/uL — AB (ref 3.70–5.45)
RDW: 18.2 % — AB (ref 11.2–14.5)
WBC: 3.3 10*3/uL — ABNORMAL LOW (ref 3.9–10.3)
lymph#: 0.5 10*3/uL — ABNORMAL LOW (ref 0.9–3.3)

## 2015-06-11 NOTE — Telephone Encounter (Signed)
Per Dr. Benay Spice, pt informed that CBC results are OK to start cycle of Ibrance.  Pt verbalizes an understanding of information and has no questions or concerns at this time.  Pt appreciative of call.

## 2015-06-11 NOTE — Telephone Encounter (Signed)
-----   Message from Ladell Pier, MD sent at 06/11/2015 12:22 PM EDT ----- Please call patient, cbc ok to start cycle of ibrance, f/u as scheduled

## 2015-06-23 ENCOUNTER — Other Ambulatory Visit: Payer: Self-pay | Admitting: *Deleted

## 2015-06-23 ENCOUNTER — Telehealth: Payer: Self-pay

## 2015-06-23 DIAGNOSIS — C50919 Malignant neoplasm of unspecified site of unspecified female breast: Secondary | ICD-10-CM

## 2015-06-23 DIAGNOSIS — C7951 Secondary malignant neoplasm of bone: Principal | ICD-10-CM

## 2015-06-23 NOTE — Telephone Encounter (Signed)
lianne from [facility] for ibrance refill. Their phone is [phone]

## 2015-06-26 ENCOUNTER — Ambulatory Visit (HOSPITAL_BASED_OUTPATIENT_CLINIC_OR_DEPARTMENT_OTHER): Payer: 59 | Admitting: Nurse Practitioner

## 2015-06-26 ENCOUNTER — Other Ambulatory Visit (HOSPITAL_BASED_OUTPATIENT_CLINIC_OR_DEPARTMENT_OTHER): Payer: 59

## 2015-06-26 ENCOUNTER — Ambulatory Visit (HOSPITAL_BASED_OUTPATIENT_CLINIC_OR_DEPARTMENT_OTHER): Payer: 59

## 2015-06-26 ENCOUNTER — Other Ambulatory Visit: Payer: Self-pay | Admitting: *Deleted

## 2015-06-26 ENCOUNTER — Telehealth: Payer: Self-pay | Admitting: Nurse Practitioner

## 2015-06-26 VITALS — BP 140/78 | HR 91 | Temp 97.8°F | Resp 18 | Wt 213.8 lb

## 2015-06-26 DIAGNOSIS — C50919 Malignant neoplasm of unspecified site of unspecified female breast: Secondary | ICD-10-CM

## 2015-06-26 DIAGNOSIS — C7951 Secondary malignant neoplasm of bone: Secondary | ICD-10-CM

## 2015-06-26 DIAGNOSIS — R0609 Other forms of dyspnea: Secondary | ICD-10-CM | POA: Diagnosis not present

## 2015-06-26 DIAGNOSIS — C50912 Malignant neoplasm of unspecified site of left female breast: Secondary | ICD-10-CM

## 2015-06-26 DIAGNOSIS — Z5111 Encounter for antineoplastic chemotherapy: Secondary | ICD-10-CM

## 2015-06-26 DIAGNOSIS — I10 Essential (primary) hypertension: Secondary | ICD-10-CM

## 2015-06-26 DIAGNOSIS — R5383 Other fatigue: Secondary | ICD-10-CM

## 2015-06-26 DIAGNOSIS — E119 Type 2 diabetes mellitus without complications: Secondary | ICD-10-CM

## 2015-06-26 LAB — COMPREHENSIVE METABOLIC PANEL
ALT: 11 U/L (ref 0–55)
AST: 16 U/L (ref 5–34)
Albumin: 3.8 g/dL (ref 3.5–5.0)
Alkaline Phosphatase: 111 U/L (ref 40–150)
Anion Gap: 11 mEq/L (ref 3–11)
BUN: 29.2 mg/dL — ABNORMAL HIGH (ref 7.0–26.0)
CO2: 25 mEq/L (ref 22–29)
Calcium: 10.5 mg/dL — ABNORMAL HIGH (ref 8.4–10.4)
Chloride: 104 mEq/L (ref 98–109)
Creatinine: 1.7 mg/dL — ABNORMAL HIGH (ref 0.6–1.1)
EGFR: 31 mL/min/{1.73_m2} — ABNORMAL LOW (ref >90)
Glucose: 83 mg/dl (ref 70–140)
Potassium: 4.6 mEq/L (ref 3.5–5.1)
Sodium: 140 mEq/L (ref 136–145)
Total Bilirubin: 0.35 mg/dL (ref 0.20–1.20)
Total Protein: 7.7 g/dL (ref 6.4–8.3)

## 2015-06-26 LAB — CBC WITH DIFFERENTIAL/PLATELET
BASO%: 0 % (ref 0.0–2.0)
BASOS ABS: 0 10*3/uL (ref 0.0–0.1)
EOS ABS: 0.1 10*3/uL (ref 0.0–0.5)
EOS%: 4.4 % (ref 0.0–7.0)
HEMATOCRIT: 34.6 % — AB (ref 34.8–46.6)
HGB: 11.4 g/dL — ABNORMAL LOW (ref 11.6–15.9)
LYMPH%: 22.2 % (ref 14.0–49.7)
MCH: 33.1 pg (ref 25.1–34.0)
MCHC: 32.9 g/dL (ref 31.5–36.0)
MCV: 100.6 fL (ref 79.5–101.0)
MONO#: 0.2 10*3/uL (ref 0.1–0.9)
MONO%: 6.3 % (ref 0.0–14.0)
NEUT%: 67.1 % (ref 38.4–76.8)
NEUTROS ABS: 1.8 10*3/uL (ref 1.5–6.5)
PLATELETS: 130 10*3/uL — AB (ref 145–400)
RBC: 3.44 10*6/uL — AB (ref 3.70–5.45)
RDW: 14.8 % — ABNORMAL HIGH (ref 11.2–14.5)
WBC: 2.7 10*3/uL — AB (ref 3.9–10.3)
lymph#: 0.6 10*3/uL — ABNORMAL LOW (ref 0.9–3.3)

## 2015-06-26 MED ORDER — OXYCODONE-ACETAMINOPHEN 10-325 MG PO TABS: 1.0000 | ORAL_TABLET | ORAL | Status: DC | PRN

## 2015-06-26 MED ORDER — FULVESTRANT 250 MG/5ML IM SOLN
500.0000 mg | Freq: Once | INTRAMUSCULAR | Status: AC
  Administered 2015-06-26: 500 mg via INTRAMUSCULAR
  Filled 2015-06-26: qty 10

## 2015-06-26 MED ORDER — DENOSUMAB 120 MG/1.7ML ~~LOC~~ SOLN
120.0000 mg | Freq: Once | SUBCUTANEOUS | Status: AC
  Administered 2015-06-26: 120 mg via SUBCUTANEOUS
  Filled 2015-06-26: qty 1.7

## 2015-06-26 MED ORDER — ALPRAZOLAM 0.5 MG PO TABS: 0.5000 mg | ORAL_TABLET | Freq: Two times a day (BID) | ORAL | Status: DC | PRN

## 2015-06-26 MED ORDER — PALBOCICLIB 100 MG PO CAPS
100.0000 mg | ORAL_CAPSULE | Freq: Every day | ORAL | Status: DC
Start: 2015-06-26 — End: 2015-07-16

## 2015-06-26 MED FILL — OXYCODONE-APAP 10-325 TAB: 10-325 | 16 days supply | Qty: 100 | Fill #0

## 2015-06-26 MED FILL — ALPRAZolam 0.5 MG TABS: 0.5 | 30 days supply | Qty: 90 | Fill #0

## 2015-06-26 NOTE — Patient Instructions (Signed)
Fulvestrant injection What is this medicine? FULVESTRANT (ful VES trant) blocks the effects of estrogen. It is used to treat breast cancer in women past the age of menopause. This medicine may be used for other purposes; ask your health care provider or pharmacist if you have questions. COMMON BRAND NAME(S): FASLODEX What should I tell my health care provider before I take this medicine? They need to know if you have any of these conditions: -bleeding problems -liver disease -low levels of platelets in the blood -an unusual or allergic reaction to fulvestrant, other medicines, foods, dyes, or preservatives -pregnant or trying to get pregnant -breast-feeding How should I use this medicine? This medicine is for injection into a muscle. It is usually given by a health care professional in a hospital or clinic setting. Talk to your pediatrician regarding the use of this medicine in children. Special care may be needed. Overdosage: If you think you have taken too much of this medicine contact a poison control center or emergency room at once. NOTE: This medicine is only for you. Do not share this medicine with others. What if I miss a dose? It is important not to miss your dose. Call your doctor or health care professional if you are unable to keep an appointment. What may interact with this medicine? -medicines that treat or prevent blood clots like warfarin, enoxaparin, and dalteparin This list may not describe all possible interactions. Give your health care provider a list of all the medicines, herbs, non-prescription drugs, or dietary supplements you use. Also tell them if you smoke, drink alcohol, or use illegal drugs. Some items may interact with your medicine. What should I watch for while using this medicine? Your condition will be monitored carefully while you are receiving this medicine. You will need important blood work done while you are taking this medicine. Do not become pregnant  while taking this medicine. Women should inform their doctor if they wish to become pregnant or think they might be pregnant. There is a potential for serious side effects to an unborn child. Talk to your health care professional or pharmacist for more information. What side effects may I notice from receiving this medicine? Side effects that you should report to your doctor or health care professional as soon as possible: -allergic reactions like skin rash, itching or hives, swelling of the face, lips, or tongue -feeling faint or lightheaded, falls -fever or flu-like symptoms -sore throat -vaginal bleeding Side effects that usually do not require medical attention (report to your doctor or health care professional if they continue or are bothersome): -aches, pains -constipation or diarrhea -headache -hot flashes -nausea, vomiting -pain at site where injected -stomach pain This list may not describe all possible side effects. Call your doctor for medical advice about side effects. You may report side effects to FDA at 1-800-FDA-1088. Where should I keep my medicine? This drug is given in a hospital or clinic and will not be stored at home. NOTE: This sheet is a summary. It may not cover all possible information. If you have questions about this medicine, talk to your doctor, pharmacist, or health care provider.  2015, Elsevier/Gold Standard. (2007-05-15 15:39:24) Denosumab injection What is this medicine? DENOSUMAB (den oh sue mab) slows bone breakdown. Prolia is used to treat osteoporosis in women after menopause and in men. Delton See is used to prevent bone fractures and other bone problems caused by cancer bone metastases. Delton See is also used to treat giant cell tumor of the bone. This  This medicine may be used for other purposes; ask your health care provider or pharmacist if you have questions. COMMON BRAND NAME(S): Prolia, XGEVA What should I tell my health care provider before I take this  medicine? They need to know if you have any of these conditions: -dental disease -eczema -infection or history of infections -kidney disease or on dialysis -low blood calcium or vitamin D -malabsorption syndrome -scheduled to have surgery or tooth extraction -taking medicine that contains denosumab -thyroid or parathyroid disease -an unusual reaction to denosumab, other medicines, foods, dyes, or preservatives -pregnant or trying to get pregnant -breast-feeding How should I use this medicine? This medicine is for injection under the skin. It is given by a health care professional in a hospital or clinic setting. If you are getting Prolia, a special MedGuide will be given to you by the pharmacist with each prescription and refill. Be sure to read this information carefully each time. For Prolia, talk to your pediatrician regarding the use of this medicine in children. Special care may be needed. For Xgeva, talk to your pediatrician regarding the use of this medicine in children. While this drug may be prescribed for children as young as 13 years for selected conditions, precautions do apply. Overdosage: If you think you've taken too much of this medicine contact a poison control center or emergency room at once. Overdosage: If you think you have taken too much of this medicine contact a poison control center or emergency room at once. NOTE: This medicine is only for you. Do not share this medicine with others. What if I miss a dose? It is important not to miss your dose. Call your doctor or health care professional if you are unable to keep an appointment. What may interact with this medicine? Do not take this medicine with any of the following medications: -other medicines containing denosumab This medicine may also interact with the following medications: -medicines that suppress the immune system -medicines that treat cancer -steroid medicines like prednisone or cortisone This list  may not describe all possible interactions. Give your health care provider a list of all the medicines, herbs, non-prescription drugs, or dietary supplements you use. Also tell them if you smoke, drink alcohol, or use illegal drugs. Some items may interact with your medicine. What should I watch for while using this medicine? Visit your doctor or health care professional for regular checks on your progress. Your doctor or health care professional may order blood tests and other tests to see how you are doing. Call your doctor or health care professional if you get a cold or other infection while receiving this medicine. Do not treat yourself. This medicine may decrease your body's ability to fight infection. You should make sure you get enough calcium and vitamin D while you are taking this medicine, unless your doctor tells you not to. Discuss the foods you eat and the vitamins you take with your health care professional. See your dentist regularly. Brush and floss your teeth as directed. Before you have any dental work done, tell your dentist you are receiving this medicine. Do not become pregnant while taking this medicine or for 5 months after stopping it. Women should inform their doctor if they wish to become pregnant or think they might be pregnant. There is a potential for serious side effects to an unborn child. Talk to your health care professional or pharmacist for more information. What side effects may I notice from receiving this medicine? Side effects that   should report to your doctor or health care professional as soon as possible: -allergic reactions like skin rash, itching or hives, swelling of the face, lips, or tongue -breathing problems -chest pain -fast, irregular heartbeat -feeling faint or lightheaded, falls -fever, chills, or any other sign of infection -muscle spasms, tightening, or twitches -numbness or tingling -skin blisters or bumps, or is dry, peels, or red -slow  healing or unexplained pain in the mouth or jaw -unusual bleeding or bruising Side effects that usually do not require medical attention (Report these to your doctor or health care professional if they continue or are bothersome.): -muscle pain -stomach upset, gas This list may not describe all possible side effects. Call your doctor for medical advice about side effects. You may report side effects to FDA at 1-800-FDA-1088. Where should I keep my medicine? This medicine is only given in a clinic, doctor's office, or other health care setting and will not be stored at home. NOTE: This sheet is a summary. It may not cover all possible information. If you have questions about this medicine, talk to your doctor, pharmacist, or health care provider.  2015, Elsevier/Gold Standard. (2011-07-05 12:37:47) Denosumab injection What is this medicine? DENOSUMAB (den oh sue mab) slows bone breakdown. Prolia is used to treat osteoporosis in women after menopause and in men. Delton See is used to prevent bone fractures and other bone problems caused by cancer bone metastases. Delton See is also used to treat giant cell tumor of the bone. This medicine may be used for other purposes; ask your health care provider or pharmacist if you have questions. What should I tell my health care provider before I take this medicine? They need to know if you have any of these conditions: -dental disease -eczema -infection or history of infections -kidney disease or on dialysis -low blood calcium or vitamin D -malabsorption syndrome -scheduled to have surgery or tooth extraction -taking medicine that contains denosumab -thyroid or parathyroid disease -an unusual reaction to denosumab, other medicines, foods, dyes, or preservatives -pregnant or trying to get pregnant -breast-feeding How should I use this medicine? This medicine is for injection under the skin. It is given by a health care professional in a hospital or clinic  setting. If you are getting Prolia, a special MedGuide will be given to you by the pharmacist with each prescription and refill. Be sure to read this information carefully each time. For Prolia, talk to your pediatrician regarding the use of this medicine in children. Special care may be needed. For Delton See, talk to your pediatrician regarding the use of this medicine in children. While this drug may be prescribed for children as young as 13 years for selected conditions, precautions do apply. Overdosage: If you think you have taken too much of this medicine contact a poison control center or emergency room at once. NOTE: This medicine is only for you. Do not share this medicine with others. What if I miss a dose? It is important not to miss your dose. Call your doctor or health care professional if you are unable to keep an appointment. What may interact with this medicine? Do not take this medicine with any of the following medications: -other medicines containing denosumab This medicine may also interact with the following medications: -medicines that suppress the immune system -medicines that treat cancer -steroid medicines like prednisone or cortisone This list may not describe all possible interactions. Give your health care provider a list of all the medicines, herbs, non-prescription drugs, or dietary supplements  you use. Also tell them if you smoke, drink alcohol, or use illegal drugs. Some items may interact with your medicine. What should I watch for while using this medicine? Visit your doctor or health care professional for regular checks on your progress. Your doctor or health care professional may order blood tests and other tests to see how you are doing. Call your doctor or health care professional if you get a cold or other infection while receiving this medicine. Do not treat yourself. This medicine may decrease your body's ability to fight infection. You should make sure you get  enough calcium and vitamin D while you are taking this medicine, unless your doctor tells you not to. Discuss the foods you eat and the vitamins you take with your health care professional. See your dentist regularly. Brush and floss your teeth as directed. Before you have any dental work done, tell your dentist you are receiving this medicine. Do not become pregnant while taking this medicine or for 5 months after stopping it. Women should inform their doctor if they wish to become pregnant or think they might be pregnant. There is a potential for serious side effects to an unborn child. Talk to your health care professional or pharmacist for more information. What side effects may I notice from receiving this medicine? Side effects that you should report to your doctor or health care professional as soon as possible: -allergic reactions like skin rash, itching or hives, swelling of the face, lips, or tongue -breathing problems -chest pain -fast, irregular heartbeat -feeling faint or lightheaded, falls -fever, chills, or any other sign of infection -muscle spasms, tightening, or twitches -numbness or tingling -skin blisters or bumps, or is dry, peels, or red -slow healing or unexplained pain in the mouth or jaw -unusual bleeding or bruising Side effects that usually do not require medical attention (Report these to your doctor or health care professional if they continue or are bothersome.): -muscle pain -stomach upset, gas This list may not describe all possible side effects. Call your doctor for medical advice about side effects. You may report side effects to FDA at 1-800-FDA-1088. Where should I keep my medicine? This medicine is only given in a clinic, doctor's office, or other health care setting and will not be stored at home. NOTE: This sheet is a summary. It may not cover all possible information. If you have questions about this medicine, talk to your doctor, pharmacist, or health  care provider.    2016, Elsevier/Gold Standard. (2011-07-05 12:37:47)

## 2015-06-26 NOTE — Progress Notes (Signed)
[place] OFFICE PROGRESS NOTE   Diagnosis:  Breast cancer  INTERVAL HISTORY:   [name] returns as scheduled. She began cycle 4 Ibrance on 06/11/2015. She denies nausea/vomiting. No mouth sores. No diarrhea. No rash. Her main complaint is progressive fatigue. She reports mild dyspnea on exertion when it is "hot and humid" outside. She does not experience any shortness of breath when inside. No cough. No fever. She notes increased bilateral hand pain related to rheumatoid arthritis. Back pain is unchanged. She continues oxycodone.  Objective:  Vital signs in last 24 hours:  Blood pressure 140/78, pulse 91, temperature 97.8 F (36.6 C), temperature source Oral, resp. rate 18, weight 213 lb 12.8 oz (96.979 kg), SpO2 98 %.    HEENT: No thrush or ulcers. Resp: Lungs clear bilaterally. Cardio: Regular rate and rhythm. GI: Abdomen soft and nontender. No hepatomegaly. Vascular: No leg edema.    Lab Results:  Lab Results  Component Value Date   WBC 2.7* 06/26/2015   HGB 11.4* 06/26/2015   HCT 34.6* 06/26/2015   MCV 100.6 06/26/2015   PLT 130* 06/26/2015   NEUTROABS 1.8 06/26/2015    Imaging:  No results found.  Medications: I have reviewed the patient's current medications.  Assessment/Plan: 1. 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 ER positive, PR positive, HER-2 negative She completed 4 cycles of adjuvant AC chemotherapy June 15 through September 03, 2008, and then completed the left chest wall radiation. She began Arimidex following an office visit on November 11, 2008.  Bone scan 06/01/2013 suggestive of thoracic spine metastases, thoracic MRI 06/27/2011 consistent with multiple bone metastases involving the thoracolumbar spine   PET scan 07/09/2013 with multiple hypermetabolic bone lesions and hypermetabolic  mediastinal nodes.   Left iliac lesion biopsy 07/26/2013. Pathology showed metastatic carcinoma, ER positive, PR positive, HER-2 negative   Initiation of Faslodex 08/10/2013.  Xgeva every 3 months initiated 07/27/2013.  Restaging PET scan 01/23/2014 with no residual hypermetabolic mediastinal disease; marked improvement in the metastatic bone disease.  Restaging PET scan 02/17/2015 with recurrence of skeletal metastasis. New hypermetabolic lesions within the pelvis and ribs. No hypermetabolic lymphadenopathy.  Continuation of Faslodex; initiation of Ibrance 03/05/2015  Cycle 2 Ibrance 04/09/2015-dose reduced to 100 mg daily  Cycle 3 Ibrance 05/08/2015  Cycle 4 Ibrance 06/11/2015 2. Left chest subcutaneous mass noted on exam October 04, 2008, status post biopsy by Dr. Brantley Stage with a benign pathology. 3. History of delayed healing of the left mastectomy incision. 4. Rheumatoid arthritis. 5. Diabetes. 6. Hypercholesterolemia. 7. Hypertension. 8. Family history of breast cancer. She has been evaluated at the genetic screening clinic. 9. 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 10. 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 appears stable. She is currently completing cycle 4 Ibrance. The plan is for a restaging PET scan after she has completed 6 cycles of Ibrance.  She will receive a Faslodex injection today as well as Xgeva.  She will return for a follow-up CBC and basic metabolic panel (follow-up mildly elevated calcium on today's labs) on 07/08/2015. If counts are adequate the plan is to begin cycle 5 Ibrance on 07/09/2015.  She will return for a follow-up visit in 4 weeks. She will contact the office  in the interim with any problems.  Plan reviewed with Dr.  Benay [name].    [name] ANP/GNP-BC   06/26/2015  11:14 AM

## 2015-06-26 NOTE — Telephone Encounter (Signed)
Gave pt apt & avs °

## 2015-07-08 ENCOUNTER — Telehealth: Payer: Self-pay | Admitting: Oncology

## 2015-07-08 ENCOUNTER — Telehealth: Payer: Self-pay

## 2015-07-08 ENCOUNTER — Other Ambulatory Visit (HOSPITAL_BASED_OUTPATIENT_CLINIC_OR_DEPARTMENT_OTHER): Payer: 59

## 2015-07-08 DIAGNOSIS — C50919 Malignant neoplasm of unspecified site of unspecified female breast: Secondary | ICD-10-CM

## 2015-07-08 DIAGNOSIS — C50912 Malignant neoplasm of unspecified site of left female breast: Secondary | ICD-10-CM

## 2015-07-08 DIAGNOSIS — C7951 Secondary malignant neoplasm of bone: Secondary | ICD-10-CM

## 2015-07-08 LAB — CBC WITH DIFFERENTIAL/PLATELET
BASO%: 0.5 % (ref 0.0–2.0)
Basophils Absolute: 0 10*3/uL (ref 0.0–0.1)
EOS ABS: 0.1 10*3/uL (ref 0.0–0.5)
EOS%: 3.6 % (ref 0.0–7.0)
HEMATOCRIT: 30.2 % — AB (ref 34.8–46.6)
HEMOGLOBIN: 9.9 g/dL — AB (ref 11.6–15.9)
LYMPH#: 0.6 10*3/uL — AB (ref 0.9–3.3)
LYMPH%: 29.1 % (ref 14.0–49.7)
MCH: 33 pg (ref 25.1–34.0)
MCHC: 32.8 g/dL (ref 31.5–36.0)
MCV: 100.7 fL (ref 79.5–101.0)
MONO#: 0.2 10*3/uL (ref 0.1–0.9)
MONO%: 11.2 % (ref 0.0–14.0)
NEUT%: 55.6 % (ref 38.4–76.8)
NEUTROS ABS: 1.1 10*3/uL — AB (ref 1.5–6.5)
Platelets: 74 10*3/uL — ABNORMAL LOW (ref 145–400)
RBC: 3 10*6/uL — ABNORMAL LOW (ref 3.70–5.45)
RDW: 14.6 % — AB (ref 11.2–14.5)
WBC: 2 10*3/uL — AB (ref 3.9–10.3)

## 2015-07-08 LAB — BASIC METABOLIC PANEL
ANION GAP: 9 meq/L (ref 3–11)
BUN: 22.8 mg/dL (ref 7.0–26.0)
CALCIUM: 8.9 mg/dL (ref 8.4–10.4)
CO2: 24 mEq/L (ref 22–29)
Chloride: 108 mEq/L (ref 98–109)
Creatinine: 1.3 mg/dL — ABNORMAL HIGH (ref 0.6–1.1)
EGFR: 42 mL/min/{1.73_m2} — AB (ref >90)
GLUCOSE: 102 mg/dL (ref 70–140)
Potassium: 4.6 mEq/L (ref 3.5–5.1)
Sodium: 140 mEq/L (ref 136–145)

## 2015-07-08 NOTE — Telephone Encounter (Signed)
spoke w/ pt confirmed 6/27 apt

## 2015-07-08 NOTE — Telephone Encounter (Signed)
-----   Message from Owens Shark, NP sent at 07/08/2015  4:10 PM EDT ----- Please let her know white count and platelets are too low to begin next cycle of Ibrance. Repeat CBC in 1 week.

## 2015-07-08 NOTE — Telephone Encounter (Signed)
Called and informed patient of lab results and not to begin ibrance yet. Informed patient scheduling to call to set up repeat lab next week. Patient verbalized all understanding and denies any questions or concerns at this time.

## 2015-07-15 ENCOUNTER — Other Ambulatory Visit (HOSPITAL_BASED_OUTPATIENT_CLINIC_OR_DEPARTMENT_OTHER): Payer: 59

## 2015-07-15 DIAGNOSIS — C7951 Secondary malignant neoplasm of bone: Secondary | ICD-10-CM

## 2015-07-15 DIAGNOSIS — C50912 Malignant neoplasm of unspecified site of left female breast: Secondary | ICD-10-CM | POA: Diagnosis not present

## 2015-07-15 LAB — CBC WITH DIFFERENTIAL/PLATELET
BASO%: 0.3 % (ref 0.0–2.0)
BASOS ABS: 0 10*3/uL (ref 0.0–0.1)
EOS ABS: 0.1 10*3/uL (ref 0.0–0.5)
EOS%: 2.7 % (ref 0.0–7.0)
HEMATOCRIT: 30.7 % — AB (ref 34.8–46.6)
HGB: 10.1 g/dL — ABNORMAL LOW (ref 11.6–15.9)
LYMPH%: 20.3 % (ref 14.0–49.7)
MCH: 33.2 pg (ref 25.1–34.0)
MCHC: 32.9 g/dL (ref 31.5–36.0)
MCV: 101 fL (ref 79.5–101.0)
MONO#: 0.6 10*3/uL (ref 0.1–0.9)
MONO%: 18.9 % — AB (ref 0.0–14.0)
NEUT%: 57.8 % (ref 38.4–76.8)
NEUTROS ABS: 1.7 10*3/uL (ref 1.5–6.5)
NRBC: 0 % (ref 0–0)
PLATELETS: 133 10*3/uL — AB (ref 145–400)
RBC: 3.04 10*6/uL — ABNORMAL LOW (ref 3.70–5.45)
RDW: 15 % — AB (ref 11.2–14.5)
WBC: 2.9 10*3/uL — AB (ref 3.9–10.3)
lymph#: 0.6 10*3/uL — ABNORMAL LOW (ref 0.9–3.3)

## 2015-07-16 ENCOUNTER — Other Ambulatory Visit: Payer: Self-pay | Admitting: *Deleted

## 2015-07-16 DIAGNOSIS — C7951 Secondary malignant neoplasm of bone: Principal | ICD-10-CM

## 2015-07-16 DIAGNOSIS — C50919 Malignant neoplasm of unspecified site of unspecified female breast: Secondary | ICD-10-CM

## 2015-07-16 MED ORDER — PALBOCICLIB 100 MG PO CAPS: 100.0000 mg | ORAL_CAPSULE | Freq: Every day | ORAL | Status: DC

## 2015-07-16 NOTE — Telephone Encounter (Signed)
CBC results reviewed with L. Marcello Moores NP.  OK to start Ibrance as ordered per Dr. Benay Spice.  Called patient to notify her of CBC results and to restart Ibrance as directed.

## 2015-07-24 ENCOUNTER — Telehealth: Payer: Self-pay | Admitting: Oncology

## 2015-07-24 ENCOUNTER — Ambulatory Visit (HOSPITAL_BASED_OUTPATIENT_CLINIC_OR_DEPARTMENT_OTHER): Payer: 59

## 2015-07-24 ENCOUNTER — Ambulatory Visit (HOSPITAL_BASED_OUTPATIENT_CLINIC_OR_DEPARTMENT_OTHER): Payer: 59 | Admitting: Nurse Practitioner

## 2015-07-24 ENCOUNTER — Other Ambulatory Visit (HOSPITAL_BASED_OUTPATIENT_CLINIC_OR_DEPARTMENT_OTHER): Payer: 59

## 2015-07-24 VITALS — BP 150/80 | HR 85 | Temp 98.5°F | Resp 18 | Ht 63.0 in | Wt 214.9 lb

## 2015-07-24 DIAGNOSIS — C50919 Malignant neoplasm of unspecified site of unspecified female breast: Secondary | ICD-10-CM

## 2015-07-24 DIAGNOSIS — C50912 Malignant neoplasm of unspecified site of left female breast: Secondary | ICD-10-CM

## 2015-07-24 DIAGNOSIS — Z5111 Encounter for antineoplastic chemotherapy: Secondary | ICD-10-CM | POA: Diagnosis not present

## 2015-07-24 DIAGNOSIS — C7951 Secondary malignant neoplasm of bone: Secondary | ICD-10-CM

## 2015-07-24 LAB — COMPREHENSIVE METABOLIC PANEL
ALBUMIN: 3.7 g/dL (ref 3.5–5.0)
ALK PHOS: 112 U/L (ref 40–150)
ALT: 12 U/L (ref 0–55)
ANION GAP: 11 meq/L (ref 3–11)
AST: 14 U/L (ref 5–34)
BUN: 31.7 mg/dL — ABNORMAL HIGH (ref 7.0–26.0)
CALCIUM: 9.6 mg/dL (ref 8.4–10.4)
CHLORIDE: 108 meq/L (ref 98–109)
CO2: 23 mEq/L (ref 22–29)
Creatinine: 1.7 mg/dL — ABNORMAL HIGH (ref 0.6–1.1)
EGFR: 32 mL/min/{1.73_m2} — AB (ref >90)
Glucose: 110 mg/dl (ref 70–140)
POTASSIUM: 4.4 meq/L (ref 3.5–5.1)
Sodium: 142 mEq/L (ref 136–145)
Total Bilirubin: 0.33 mg/dL (ref 0.20–1.20)
Total Protein: 7.3 g/dL (ref 6.4–8.3)

## 2015-07-24 LAB — CBC WITH DIFFERENTIAL/PLATELET
BASO%: 0 % (ref 0.0–2.0)
BASOS ABS: 0 10*3/uL (ref 0.0–0.1)
EOS%: 4.6 % (ref 0.0–7.0)
Eosinophils Absolute: 0.2 10*3/uL (ref 0.0–0.5)
HEMATOCRIT: 33.3 % — AB (ref 34.8–46.6)
HGB: 11.1 g/dL — ABNORMAL LOW (ref 11.6–15.9)
LYMPH#: 0.6 10*3/uL — AB (ref 0.9–3.3)
LYMPH%: 13.9 % — ABNORMAL LOW (ref 14.0–49.7)
MCH: 33.5 pg (ref 25.1–34.0)
MCHC: 33.3 g/dL (ref 31.5–36.0)
MCV: 100.6 fL (ref 79.5–101.0)
MONO#: 0.2 10*3/uL (ref 0.1–0.9)
MONO%: 4.3 % (ref 0.0–14.0)
NEUT#: 3.1 10*3/uL (ref 1.5–6.5)
NEUT%: 77.2 % — AB (ref 38.4–76.8)
PLATELETS: 195 10*3/uL (ref 145–400)
RBC: 3.31 10*6/uL — ABNORMAL LOW (ref 3.70–5.45)
RDW: 13.8 % (ref 11.2–14.5)
WBC: 4 10*3/uL (ref 3.9–10.3)

## 2015-07-24 MED ORDER — FULVESTRANT 250 MG/5ML IM SOLN
500.0000 mg | Freq: Once | INTRAMUSCULAR | Status: AC
  Administered 2015-07-24: 500 mg via INTRAMUSCULAR
  Filled 2015-07-24: qty 10

## 2015-07-24 MED ORDER — OXYCODONE-ACETAMINOPHEN 10-325 MG PO TABS: 1.0000 | ORAL_TABLET | ORAL | Status: DC | PRN

## 2015-07-24 MED FILL — OXYCODONE-APAP 10-325 TAB: 10-325 | 20 days supply | Qty: 120 | Fill #0

## 2015-07-24 NOTE — Progress Notes (Signed)
[place] OFFICE PROGRESS NOTE   Diagnosis:  Breast cancer  INTERVAL HISTORY:   [name] returns as scheduled. She began cycle 5 Ibrance 07/15/2015. She denies nausea/vomiting. No mouth sores. No diarrhea or constipation. No rash. She continues to have pain mainly at the left hip and mid to low back. She is taking Percocet 10/325 one tablet every 6 hours. About 6 times a month she takes an additional half tablet at night time. Her main complaint continues to be fatigue.  Objective:  Vital signs in last 24 hours:  Blood pressure 150/80, pulse 85, temperature 98.5 F (36.9 C), temperature source Oral, resp. rate 18, height _0  (1.6 m), weight 214 lb 14.4 oz (97.478 kg), SpO2 99 %.    HEENT: No thrush or ulcers. Resp: Lungs clear bilaterally. Cardio: Regular rate and rhythm. GI: Abdomen soft and nontender. No hepatomegaly. Vascular: No leg edema.    Lab Results:  Lab Results  Component Value Date   WBC 4.0 07/24/2015   HGB 11.1* 07/24/2015   HCT 33.3* 07/24/2015   MCV 100.6 07/24/2015   PLT 195 07/24/2015   NEUTROABS 3.1 07/24/2015    Imaging:  No results found.  Medications: I have reviewed the patient's current medications.  Assessment/Plan: 1. 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 ER positive, PR positive, HER-2 negative She completed 4 cycles of adjuvant AC chemotherapy June 15 through September 03, 2008, and then completed the left chest wall radiation. She began Arimidex following an office visit on November 11, 2008.  Bone scan 06/01/2013 suggestive of thoracic spine metastases, thoracic MRI 06/27/2011 consistent with multiple bone metastases involving the thoracolumbar spine   PET scan 07/09/2013 with multiple hypermetabolic bone lesions and hypermetabolic mediastinal nodes.   Left iliac lesion biopsy  07/26/2013. Pathology showed metastatic carcinoma, ER positive, PR positive, HER-2 negative   Initiation of Faslodex 08/10/2013.  Xgeva every 3 months initiated 07/27/2013.  Restaging PET scan 01/23/2014 with no residual hypermetabolic mediastinal disease; marked improvement in the metastatic bone disease.  Restaging PET scan 02/17/2015 with recurrence of skeletal metastasis. New hypermetabolic lesions within the pelvis and ribs. No hypermetabolic lymphadenopathy.  Continuation of Faslodex; initiation of Ibrance 03/05/2015  Cycle 2 Ibrance 04/09/2015-dose reduced to 100 mg daily  Cycle 3 Ibrance 05/08/2015  Cycle 4 Ibrance 06/11/2015  Cycle 5 Ibrance 07/15/2015 2. Left chest subcutaneous mass noted on exam October 04, 2008, status post biopsy by Dr. Brantley Stage with a benign pathology. 3. History of delayed healing of the left mastectomy incision. 4. Rheumatoid arthritis. 5. Diabetes. 6. Hypercholesterolemia. 7. Hypertension. 8. Family history of breast cancer. She has been evaluated at the genetic screening clinic. 9. 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 10. 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 appears stable. She is completing cycle 5 Ibrance. Dr. Benay Spice recommends a restaging PET scan following completion of cycle 5.  She will receive Faslodex today.  For pain she will continue Percocet as needed. She was provided with a new prescription today.  She will return for a follow-up visit in approximately 3 weeks. She will contact the office in the interim with any problems.  Plan reviewed with Dr. Benay Spice.    [name] ANP/GNP-BC   07/24/2015  12:13 PM

## 2015-07-24 NOTE — Telephone Encounter (Signed)
Gave and printed appt sched and avs for pt for July  °

## 2015-07-24 NOTE — Patient Instructions (Signed)
Fulvestrant injection What is this medicine? FULVESTRANT (ful VES trant) blocks the effects of estrogen. It is used to treat breast cancer in women past the age of menopause. This medicine may be used for other purposes; ask your health care provider or pharmacist if you have questions. COMMON BRAND NAME(S): FASLODEX What should I tell my health care provider before I take this medicine? They need to know if you have any of these conditions: -bleeding problems -liver disease -low levels of platelets in the blood -an unusual or allergic reaction to fulvestrant, other medicines, foods, dyes, or preservatives -pregnant or trying to get pregnant -breast-feeding How should I use this medicine? This medicine is for injection into a muscle. It is usually given by a health care professional in a hospital or clinic setting. Talk to your pediatrician regarding the use of this medicine in children. Special care may be needed. Overdosage: If you think you have taken too much of this medicine contact a poison control center or emergency room at once. NOTE: This medicine is only for you. Do not share this medicine with others. What if I miss a dose? It is important not to miss your dose. Call your doctor or health care professional if you are unable to keep an appointment. What may interact with this medicine? -medicines that treat or prevent blood clots like warfarin, enoxaparin, and dalteparin This list may not describe all possible interactions. Give your health care provider a list of all the medicines, herbs, non-prescription drugs, or dietary supplements you use. Also tell them if you smoke, drink alcohol, or use illegal drugs. Some items may interact with your medicine. What should I watch for while using this medicine? Your condition will be monitored carefully while you are receiving this medicine. You will need important blood work done while you are taking this medicine. Do not become pregnant  while taking this medicine. Women should inform their doctor if they wish to become pregnant or think they might be pregnant. There is a potential for serious side effects to an unborn child. Talk to your health care professional or pharmacist for more information. What side effects may I notice from receiving this medicine? Side effects that you should report to your doctor or health care professional as soon as possible: -allergic reactions like skin rash, itching or hives, swelling of the face, lips, or tongue -feeling faint or lightheaded, falls -fever or flu-like symptoms -sore throat -vaginal bleeding Side effects that usually do not require medical attention (report to your doctor or health care professional if they continue or are bothersome): -aches, pains -constipation or diarrhea -headache -hot flashes -nausea, vomiting -pain at site where injected -stomach pain This list may not describe all possible side effects. Call your doctor for medical advice about side effects. You may report side effects to FDA at 1-800-FDA-1088. Where should I keep my medicine? This drug is given in a hospital or clinic and will not be stored at home. NOTE: This sheet is a summary. It may not cover all possible information. If you have questions about this medicine, talk to your doctor, pharmacist, or health care provider.  2015, Elsevier/Gold Standard. (2007-05-15 15:39:24) Denosumab injection What is this medicine? DENOSUMAB (den oh sue mab) slows bone breakdown. Prolia is used to treat osteoporosis in women after menopause and in men. Delton See is used to prevent bone fractures and other bone problems caused by cancer bone metastases. Delton See is also used to treat giant cell tumor of the bone. This  This medicine may be used for other purposes; ask your health care provider or pharmacist if you have questions. COMMON BRAND NAME(S): Prolia, XGEVA What should I tell my health care provider before I take this  medicine? They need to know if you have any of these conditions: -dental disease -eczema -infection or history of infections -kidney disease or on dialysis -low blood calcium or vitamin D -malabsorption syndrome -scheduled to have surgery or tooth extraction -taking medicine that contains denosumab -thyroid or parathyroid disease -an unusual reaction to denosumab, other medicines, foods, dyes, or preservatives -pregnant or trying to get pregnant -breast-feeding How should I use this medicine? This medicine is for injection under the skin. It is given by a health care professional in a hospital or clinic setting. If you are getting Prolia, a special MedGuide will be given to you by the pharmacist with each prescription and refill. Be sure to read this information carefully each time. For Prolia, talk to your pediatrician regarding the use of this medicine in children. Special care may be needed. For Xgeva, talk to your pediatrician regarding the use of this medicine in children. While this drug may be prescribed for children as young as 13 years for selected conditions, precautions do apply. Overdosage: If you think you've taken too much of this medicine contact a poison control center or emergency room at once. Overdosage: If you think you have taken too much of this medicine contact a poison control center or emergency room at once. NOTE: This medicine is only for you. Do not share this medicine with others. What if I miss a dose? It is important not to miss your dose. Call your doctor or health care professional if you are unable to keep an appointment. What may interact with this medicine? Do not take this medicine with any of the following medications: -other medicines containing denosumab This medicine may also interact with the following medications: -medicines that suppress the immune system -medicines that treat cancer -steroid medicines like prednisone or cortisone This list  may not describe all possible interactions. Give your health care provider a list of all the medicines, herbs, non-prescription drugs, or dietary supplements you use. Also tell them if you smoke, drink alcohol, or use illegal drugs. Some items may interact with your medicine. What should I watch for while using this medicine? Visit your doctor or health care professional for regular checks on your progress. Your doctor or health care professional may order blood tests and other tests to see how you are doing. Call your doctor or health care professional if you get a cold or other infection while receiving this medicine. Do not treat yourself. This medicine may decrease your body's ability to fight infection. You should make sure you get enough calcium and vitamin D while you are taking this medicine, unless your doctor tells you not to. Discuss the foods you eat and the vitamins you take with your health care professional. See your dentist regularly. Brush and floss your teeth as directed. Before you have any dental work done, tell your dentist you are receiving this medicine. Do not become pregnant while taking this medicine or for 5 months after stopping it. Women should inform their doctor if they wish to become pregnant or think they might be pregnant. There is a potential for serious side effects to an unborn child. Talk to your health care professional or pharmacist for more information. What side effects may I notice from receiving this medicine? Side effects that   should report to your doctor or health care professional as soon as possible: -allergic reactions like skin rash, itching or hives, swelling of the face, lips, or tongue -breathing problems -chest pain -fast, irregular heartbeat -feeling faint or lightheaded, falls -fever, chills, or any other sign of infection -muscle spasms, tightening, or twitches -numbness or tingling -skin blisters or bumps, or is dry, peels, or red -slow  healing or unexplained pain in the mouth or jaw -unusual bleeding or bruising Side effects that usually do not require medical attention (Report these to your doctor or health care professional if they continue or are bothersome.): -muscle pain -stomach upset, gas This list may not describe all possible side effects. Call your doctor for medical advice about side effects. You may report side effects to FDA at 1-800-FDA-1088. Where should I keep my medicine? This medicine is only given in a clinic, doctor's office, or other health care setting and will not be stored at home. NOTE: This sheet is a summary. It may not cover all possible information. If you have questions about this medicine, talk to your doctor, pharmacist, or health care provider.  2015, Elsevier/Gold Standard. (2011-07-05 12:37:47) Denosumab injection What is this medicine? DENOSUMAB (den oh sue mab) slows bone breakdown. Prolia is used to treat osteoporosis in women after menopause and in men. Delton See is used to prevent bone fractures and other bone problems caused by cancer bone metastases. Delton See is also used to treat giant cell tumor of the bone. This medicine may be used for other purposes; ask your health care provider or pharmacist if you have questions. What should I tell my health care provider before I take this medicine? They need to know if you have any of these conditions: -dental disease -eczema -infection or history of infections -kidney disease or on dialysis -low blood calcium or vitamin D -malabsorption syndrome -scheduled to have surgery or tooth extraction -taking medicine that contains denosumab -thyroid or parathyroid disease -an unusual reaction to denosumab, other medicines, foods, dyes, or preservatives -pregnant or trying to get pregnant -breast-feeding How should I use this medicine? This medicine is for injection under the skin. It is given by a health care professional in a hospital or clinic  setting. If you are getting Prolia, a special MedGuide will be given to you by the pharmacist with each prescription and refill. Be sure to read this information carefully each time. For Prolia, talk to your pediatrician regarding the use of this medicine in children. Special care may be needed. For Delton See, talk to your pediatrician regarding the use of this medicine in children. While this drug may be prescribed for children as young as 13 years for selected conditions, precautions do apply. Overdosage: If you think you have taken too much of this medicine contact a poison control center or emergency room at once. NOTE: This medicine is only for you. Do not share this medicine with others. What if I miss a dose? It is important not to miss your dose. Call your doctor or health care professional if you are unable to keep an appointment. What may interact with this medicine? Do not take this medicine with any of the following medications: -other medicines containing denosumab This medicine may also interact with the following medications: -medicines that suppress the immune system -medicines that treat cancer -steroid medicines like prednisone or cortisone This list may not describe all possible interactions. Give your health care provider a list of all the medicines, herbs, non-prescription drugs, or dietary supplements  you use. Also tell them if you smoke, drink alcohol, or use illegal drugs. Some items may interact with your medicine. What should I watch for while using this medicine? Visit your doctor or health care professional for regular checks on your progress. Your doctor or health care professional may order blood tests and other tests to see how you are doing. Call your doctor or health care professional if you get a cold or other infection while receiving this medicine. Do not treat yourself. This medicine may decrease your body's ability to fight infection. You should make sure you get  enough calcium and vitamin D while you are taking this medicine, unless your doctor tells you not to. Discuss the foods you eat and the vitamins you take with your health care professional. See your dentist regularly. Brush and floss your teeth as directed. Before you have any dental work done, tell your dentist you are receiving this medicine. Do not become pregnant while taking this medicine or for 5 months after stopping it. Women should inform their doctor if they wish to become pregnant or think they might be pregnant. There is a potential for serious side effects to an unborn child. Talk to your health care professional or pharmacist for more information. What side effects may I notice from receiving this medicine? Side effects that you should report to your doctor or health care professional as soon as possible: -allergic reactions like skin rash, itching or hives, swelling of the face, lips, or tongue -breathing problems -chest pain -fast, irregular heartbeat -feeling faint or lightheaded, falls -fever, chills, or any other sign of infection -muscle spasms, tightening, or twitches -numbness or tingling -skin blisters or bumps, or is dry, peels, or red -slow healing or unexplained pain in the mouth or jaw -unusual bleeding or bruising Side effects that usually do not require medical attention (Report these to your doctor or health care professional if they continue or are bothersome.): -muscle pain -stomach upset, gas This list may not describe all possible side effects. Call your doctor for medical advice about side effects. You may report side effects to FDA at 1-800-FDA-1088. Where should I keep my medicine? This medicine is only given in a clinic, doctor's office, or other health care setting and will not be stored at home. NOTE: This sheet is a summary. It may not cover all possible information. If you have questions about this medicine, talk to your doctor, pharmacist, or health  care provider.    2016, Elsevier/Gold Standard. (2011-07-05 12:37:47)

## 2015-08-04 ENCOUNTER — Other Ambulatory Visit: Payer: Self-pay | Admitting: *Deleted

## 2015-08-04 DIAGNOSIS — C50919 Malignant neoplasm of unspecified site of unspecified female breast: Secondary | ICD-10-CM

## 2015-08-04 DIAGNOSIS — C7951 Secondary malignant neoplasm of bone: Principal | ICD-10-CM

## 2015-08-04 MED ORDER — PALBOCICLIB 100 MG PO CAPS: 100.0000 mg | ORAL_CAPSULE | Freq: Every day | ORAL | Status: DC

## 2015-08-12 ENCOUNTER — Ambulatory Visit (HOSPITAL_COMMUNITY)
Admission: RE | Admit: 2015-08-12 | Discharge: 2015-08-12 | Disposition: A | Discharge status: Home / Self Care | Payer: 59 | Source: Ambulatory Visit | Attending: Nurse Practitioner | Admitting: Nurse Practitioner

## 2015-08-12 DIAGNOSIS — C7951 Secondary malignant neoplasm of bone: Secondary | ICD-10-CM | POA: Diagnosis present

## 2015-08-12 DIAGNOSIS — C50919 Malignant neoplasm of unspecified site of unspecified female breast: Secondary | ICD-10-CM | POA: Diagnosis present

## 2015-08-12 LAB — GLUCOSE, CAPILLARY: Glucose-Capillary: 120 mg/dL — ABNORMAL HIGH (ref 65–99)

## 2015-08-12 MED ORDER — FLUDEOXYGLUCOSE F - 18 (FDG) INJECTION
10.4000 | Freq: Once | INTRAVENOUS | Status: AC | PRN
  Administered 2015-08-12: 10.4 via INTRAVENOUS

## 2015-08-15 ENCOUNTER — Other Ambulatory Visit (HOSPITAL_BASED_OUTPATIENT_CLINIC_OR_DEPARTMENT_OTHER): Payer: 59

## 2015-08-15 ENCOUNTER — Other Ambulatory Visit: Payer: Self-pay | Admitting: *Deleted

## 2015-08-15 ENCOUNTER — Telehealth: Payer: Self-pay | Admitting: *Deleted

## 2015-08-15 ENCOUNTER — Ambulatory Visit (HOSPITAL_BASED_OUTPATIENT_CLINIC_OR_DEPARTMENT_OTHER): Payer: 59 | Admitting: Oncology

## 2015-08-15 ENCOUNTER — Telehealth: Payer: Self-pay | Admitting: Oncology

## 2015-08-15 VITALS — BP 142/71 | HR 96 | Temp 97.9°F | Resp 18 | Ht 63.0 in | Wt 216.3 lb

## 2015-08-15 DIAGNOSIS — C50912 Malignant neoplasm of unspecified site of left female breast: Secondary | ICD-10-CM | POA: Diagnosis not present

## 2015-08-15 DIAGNOSIS — C7951 Secondary malignant neoplasm of bone: Secondary | ICD-10-CM

## 2015-08-15 DIAGNOSIS — E876 Hypokalemia: Secondary | ICD-10-CM

## 2015-08-15 DIAGNOSIS — E119 Type 2 diabetes mellitus without complications: Secondary | ICD-10-CM

## 2015-08-15 DIAGNOSIS — I10 Essential (primary) hypertension: Secondary | ICD-10-CM

## 2015-08-15 DIAGNOSIS — C50919 Malignant neoplasm of unspecified site of unspecified female breast: Secondary | ICD-10-CM

## 2015-08-15 DIAGNOSIS — G893 Neoplasm related pain (acute) (chronic): Secondary | ICD-10-CM | POA: Diagnosis not present

## 2015-08-15 LAB — CBC WITH DIFFERENTIAL/PLATELET
BASO%: 0.5 % (ref 0.0–2.0)
Basophils Absolute: 0 10*3/uL (ref 0.0–0.1)
EOS%: 1.9 % (ref 0.0–7.0)
Eosinophils Absolute: 0 10*3/uL (ref 0.0–0.5)
HEMATOCRIT: 33.7 % — AB (ref 34.8–46.6)
HGB: 11.2 g/dL — ABNORMAL LOW (ref 11.6–15.9)
LYMPH#: 0.6 10*3/uL — AB (ref 0.9–3.3)
LYMPH%: 27.6 % (ref 14.0–49.7)
MCH: 33.7 pg (ref 25.1–34.0)
MCHC: 33.2 g/dL (ref 31.5–36.0)
MCV: 101.5 fL — ABNORMAL HIGH (ref 79.5–101.0)
MONO#: 0.3 10*3/uL (ref 0.1–0.9)
MONO%: 11.7 % (ref 0.0–14.0)
NEUT%: 58.3 % (ref 38.4–76.8)
NEUTROS ABS: 1.3 10*3/uL — AB (ref 1.5–6.5)
Platelets: 87 10*3/uL — ABNORMAL LOW (ref 145–400)
RBC: 3.32 10*6/uL — AB (ref 3.70–5.45)
RDW: 14.3 % (ref 11.2–14.5)
WBC: 2.1 10*3/uL — AB (ref 3.9–10.3)

## 2015-08-15 LAB — COMPREHENSIVE METABOLIC PANEL
ALT: 17 U/L (ref 0–55)
AST: 18 U/L (ref 5–34)
Albumin: 3.7 g/dL (ref 3.5–5.0)
Alkaline Phosphatase: 130 U/L (ref 40–150)
Anion Gap: 10 mEq/L (ref 3–11)
BUN: 34.9 mg/dL — AB (ref 7.0–26.0)
CALCIUM: 10.3 mg/dL (ref 8.4–10.4)
CHLORIDE: 107 meq/L (ref 98–109)
CO2: 22 meq/L (ref 22–29)
CREATININE: 1.5 mg/dL — AB (ref 0.6–1.1)
EGFR: 37 mL/min/{1.73_m2} — ABNORMAL LOW (ref >90)
Glucose: 109 mg/dl (ref 70–140)
Potassium: 5.6 mEq/L — ABNORMAL HIGH (ref 3.5–5.1)
Sodium: 139 mEq/L (ref 136–145)
Total Bilirubin: <0.3 mg/dL (ref 0.20–1.20)
Total Protein: 7.5 g/dL (ref 6.4–8.3)

## 2015-08-15 MED ORDER — EPINEPHRINE HCL 0.1 MG/ML IJ SOLN: 0.2500 mg | Freq: Once | INTRAMUSCULAR | Status: DC | PRN

## 2015-08-15 MED ORDER — ALPRAZOLAM 0.5 MG PO TABS: 0.5000 mg | ORAL_TABLET | Freq: Two times a day (BID) | ORAL | 0 refills | Status: DC | PRN

## 2015-08-15 MED ORDER — METHYLPREDNISOLONE SODIUM SUCC 125 MG IJ SOLR: 125.0000 mg | Freq: Once | INTRAMUSCULAR | Status: DC | PRN

## 2015-08-15 MED ORDER — SODIUM CHLORIDE 0.9 % IV SOLN: Freq: Once | INTRAVENOUS | Status: DC | PRN

## 2015-08-15 MED ORDER — DIPHENHYDRAMINE HCL 50 MG/ML IJ SOLN: 25.0000 mg | Freq: Once | INTRAMUSCULAR | Status: DC | PRN

## 2015-08-15 MED ORDER — DIPHENHYDRAMINE HCL 50 MG/ML IJ SOLN: 50.0000 mg | Freq: Once | INTRAMUSCULAR | Status: DC | PRN

## 2015-08-15 MED ORDER — DENOSUMAB 120 MG/1.7ML ~~LOC~~ SOLN
120.0000 mg | Freq: Once | SUBCUTANEOUS | Status: AC
  Administered 2015-08-15: 120 mg via SUBCUTANEOUS
  Filled 2015-08-15: qty 1.7

## 2015-08-15 MED ORDER — TAMOXIFEN CITRATE 20 MG PO TABS: 20.0000 mg | ORAL_TABLET | Freq: Every day | ORAL | 0 refills | Status: DC

## 2015-08-15 MED ORDER — ALBUTEROL SULFATE (2.5 MG/3ML) 0.083% IN NEBU
2.5000 mg | INHALATION_SOLUTION | Freq: Once | RESPIRATORY_TRACT | Status: DC | PRN
  Filled 2015-08-15: qty 3

## 2015-08-15 MED ORDER — OXYCODONE-ACETAMINOPHEN 10-325 MG PO TABS: 1.0000 | ORAL_TABLET | ORAL | 0 refills | Status: DC | PRN

## 2015-08-15 MED FILL — TAMOXIFEN 20 MG TABLET: 20 | 30 days supply | Qty: 30 | Fill #0

## 2015-08-15 NOTE — Telephone Encounter (Signed)
per pof to sch pt appt-gave pt copy of avs °

## 2015-08-15 NOTE — Progress Notes (Signed)
[place] OFFICE PROGRESS NOTE   Diagnosis: Breast cancer  INTERVAL HISTORY:   [name] returns as scheduled. She is currently maintained off of Ibrance. She continues to have pain at the left iliac region and mid back. She takes Percocet every 4 hours for relief of pain. She is noted increased arthritis pain and swelling in the hands.   Objective:  Vital signs in last 24 hours:  Blood pressure (!) 142/71, pulse 96, temperature 97.9 F (36.6 C), temperature source Oral, resp. rate 18, height '5\' 3"'  (1.6 m), weight 216 lb 4.8 oz (98.1 kg), SpO2 100 %.    HEENT: Neck without mass Lymphatics: No cervical, supra-clavicular, or axillary nodes Resp: Lungs clear bilaterally Cardio: Regular rate and rhythm GI: No hepatomegaly Vascular: No leg edema Breasts: Status post left mastectomy. No evidence for chest wall tumor recurrence  Musculoskeletal: Swelling at the second MTP joint of the right hand      Lab Results:  Lab Results  Component Value Date   WBC 2.1 (L) 08/15/2015   HGB 11.2 (L) 08/15/2015   HCT 33.7 (L) 08/15/2015   MCV 101.5 (H) 08/15/2015   PLT 87 (L) 08/15/2015   NEUTROABS 1.3 (L) 08/15/2015   Potassium 5.6, BUN 34.9, crit 21.5, calcium 10.3, alkaline phosphatase 1:30   Imaging:  Nm Pet Image Restag (ps) Skull Base To Thigh  Result Date: 08/12/2015 CLINICAL DATA:  Subsequent treatment strategy for tumor type. EXAM: NUCLEAR MEDICINE PET SKULL BASE TO THIGH TECHNIQUE: 10.4 mCi F-18 FDG was injected intravenously. Full-ring PET imaging was performed from the skull base to thigh after the radiotracer. CT data was obtained and used for attenuation correction and anatomic localization. FASTING BLOOD GLUCOSE:  Value: 120 mg/dl COMPARISON:  PET-CT scan 02/17/2015 FINDINGS: NECK No hypermetabolic lymph nodes in the neck. CHEST No hypermetabolic mediastinal or hilar nodes. No suspicious pulmonary nodules on the CT scan. ABDOMEN/PELVIS No abnormal  hypermetabolic activity within the liver, pancreas, adrenal glands, or spleen. No hypermetabolic lymph nodes in the abdomen or pelvis. SKELETON Several sites of hypermetabolic pelves skeletal metastasis has increased in intensity in the interval. For example, lesion in the RIGHT iliac wing (fused image 151) has SUV max equal 12.2 increased from 3.6. Lesion in the iliac body more medially with SUV max equal 15.3 increased from 13.2. These lesions appear slightly increased in size on the metabolic imaging. Lesions are difficult to define on the CT imaging. There multiple sclerotic lesions throughout the skeleton which are not metabolic. Pathologic fracture the posterior RIGHT rib (image 79, series 4) with moderate metabolic activity similar prior. No clear new sites of skeletal metastasis are identified IMPRESSION: Wall 1. Mild progression of skeletal metastasis within the pelvis with increased in metabolic activity and slight increase in size. 2. No new skeletal lesions are identified. 3. Multiple sclerotic skeletal metastasis are not hypermetabolic. 4. No evidence soft tissue metastasis. Electronically Signed   By: Suzy Bouchard M.D.   On: 08/12/2015 09:26 PET images reviewed with [name] and her family  Medications: I have reviewed the patient's current medications.  Assessment/Plan: 1. 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 ER positive, PR positive, HER-2 negative She completed 4 cycles of adjuvant AC chemotherapy June 15 through September 03, 2008, and then completed the left chest wall radiation. She began Arimidex following an office visit on November 11, 2008.  Bone scan  06/01/2013 suggestive of thoracic spine metastases, thoracic MRI 06/27/2011 consistent with multiple bone metastases involving the thoracolumbar spine   PET scan 07/09/2013 with multiple  hypermetabolic bone lesions and hypermetabolic mediastinal nodes.   Left iliac lesion biopsy 07/26/2013. Pathology showed metastatic carcinoma, ER positive, PR positive, HER-2 negative   Initiation of Faslodex 08/10/2013.  Xgeva every 3 months initiated 07/27/2013.  Restaging PET scan 01/23/2014 with no residual hypermetabolic mediastinal disease; marked improvement in the metastatic bone disease.  Restaging PET scan 02/17/2015 with recurrence of skeletal metastasis. New hypermetabolic lesions within the pelvis and ribs. No hypermetabolic lymphadenopathy.  Continuation of Faslodex; initiation of Ibrance 03/05/2015  Cycle 2 Ibrance 04/09/2015-dose reduced to 100 mg daily  Cycle 3 Ibrance 05/08/2015  Cycle 4 Ibrance 06/11/2015  Cycle 5 Ibrance 07/15/2015  PET scan 08/12/2015-increase in size and increased metabolic activity associated with bone metastases, no new lesions  Ibrance/Faslodex discontinued  Tamoxifen started 08/07/2015 2. Left chest subcutaneous mass noted on exam October 04, 2008, status post biopsy by Dr. Brantley Stage with a benign pathology. 3. History of delayed healing of the left mastectomy incision. 4. Rheumatoid arthritis. 5. Diabetes. 6. Hypercholesterolemia. 7. Hypertension. 8. Family history of breast cancer. She has been evaluated at the genetic screening clinic. 9. 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 10. 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 appears stable. She continues to have pain related to bone metastases. The restaging PET reveals enlargement of several bone lesions. My clinical impression is that she has slow disease progression. We decided to discontinue ibrance/Faslodex.  She will begin tamoxifen therapy. We  reviewed the potential toxicities associated with tamoxifen including the chance for thromboembolic disease, hot flashes, and the increased risk of uterine cancer. She agrees to proceed with tamoxifen.  [name] will continue xgeva. We refill prescriptions for Xanax and Percocet today.  She will return for an office visit in one month.  The potassium was elevated today. She will have a repeat chemistry panel prior to leaving the office today.  Betsy Coder, MD  08/15/2015  11:02 AM

## 2015-08-15 NOTE — Telephone Encounter (Signed)
Call placed to patient to inform her that repeat potassium is 5.9 and to d/c Accupril per Dr. Benay Spice and to call her primary MD to f/u with potassium results.  Pt verbalizes an understanding of MD instructions. Call placed to pt.'s primary MD to notify them of potassium results. CBC and CMET faxed to [place] at [facility]  Per Pam, she will f/u potassium results with Dr. Redmond Pulling who is on call for Bing Matter PA.  Pt informed of all information and appreciative of call.

## 2015-08-16 LAB — CANCER ANTIGEN 27.29: CA 27.29: 32.6 U/mL (ref 0.0–38.6)

## 2015-08-21 ENCOUNTER — Telehealth: Payer: Self-pay | Admitting: *Deleted

## 2015-08-21 NOTE — Telephone Encounter (Signed)
Received fax from [facility] stating they have not been able to contact pt re: [facility] Rx. Called pharmacy, notified Kimesh,PharmD  That [facility] has been discontinued.

## 2015-08-22 MED FILL — ALPRAZolam 0.5 MG TABS: 0.5 | 30 days supply | Qty: 90 | Fill #0

## 2015-08-22 MED FILL — OXYCODONE-APAP 10-325 TAB: 10-325 | 20 days supply | Qty: 120 | Fill #0

## 2015-09-15 ENCOUNTER — Telehealth: Payer: Self-pay | Admitting: Oncology

## 2015-09-15 ENCOUNTER — Ambulatory Visit (HOSPITAL_BASED_OUTPATIENT_CLINIC_OR_DEPARTMENT_OTHER): Payer: 59 | Admitting: Nurse Practitioner

## 2015-09-15 ENCOUNTER — Other Ambulatory Visit: Payer: Self-pay | Admitting: *Deleted

## 2015-09-15 ENCOUNTER — Ambulatory Visit (HOSPITAL_BASED_OUTPATIENT_CLINIC_OR_DEPARTMENT_OTHER): Payer: 59

## 2015-09-15 VITALS — BP 133/77 | HR 90 | Temp 98.2°F | Resp 18 | Ht 63.0 in | Wt 212.3 lb

## 2015-09-15 DIAGNOSIS — C50919 Malignant neoplasm of unspecified site of unspecified female breast: Secondary | ICD-10-CM

## 2015-09-15 DIAGNOSIS — C50912 Malignant neoplasm of unspecified site of left female breast: Secondary | ICD-10-CM

## 2015-09-15 DIAGNOSIS — R52 Pain, unspecified: Secondary | ICD-10-CM | POA: Diagnosis not present

## 2015-09-15 DIAGNOSIS — C7951 Secondary malignant neoplasm of bone: Secondary | ICD-10-CM | POA: Diagnosis not present

## 2015-09-15 DIAGNOSIS — M545 Low back pain: Secondary | ICD-10-CM

## 2015-09-15 DIAGNOSIS — E119 Type 2 diabetes mellitus without complications: Secondary | ICD-10-CM

## 2015-09-15 DIAGNOSIS — M069 Rheumatoid arthritis, unspecified: Secondary | ICD-10-CM

## 2015-09-15 LAB — COMPREHENSIVE METABOLIC PANEL
ALT: 13 U/L (ref 0–55)
ANION GAP: 11 meq/L (ref 3–11)
AST: 18 U/L (ref 5–34)
Albumin: 3.6 g/dL (ref 3.5–5.0)
Alkaline Phosphatase: 102 U/L (ref 40–150)
BILIRUBIN TOTAL: 0.35 mg/dL (ref 0.20–1.20)
BUN: 28.4 mg/dL — ABNORMAL HIGH (ref 7.0–26.0)
CHLORIDE: 107 meq/L (ref 98–109)
CO2: 24 meq/L (ref 22–29)
Calcium: 9.4 mg/dL (ref 8.4–10.4)
Creatinine: 1.3 mg/dL — ABNORMAL HIGH (ref 0.6–1.1)
EGFR: 43 mL/min/{1.73_m2} — AB (ref >90)
Glucose: 97 mg/dl (ref 70–140)
Potassium: 4.6 mEq/L (ref 3.5–5.1)
Sodium: 142 mEq/L (ref 136–145)
Total Protein: 7.4 g/dL (ref 6.4–8.3)

## 2015-09-15 LAB — CBC WITH DIFFERENTIAL/PLATELET
BASO%: 0.1 % (ref 0.0–2.0)
Basophils Absolute: 0 10*3/uL (ref 0.0–0.1)
EOS ABS: 0.2 10*3/uL (ref 0.0–0.5)
EOS%: 2.8 % (ref 0.0–7.0)
HCT: 36.6 % (ref 34.8–46.6)
HGB: 11.9 g/dL (ref 11.6–15.9)
LYMPH%: 7.6 % — ABNORMAL LOW (ref 14.0–49.7)
MCH: 32.5 pg (ref 25.1–34.0)
MCHC: 32.7 g/dL (ref 31.5–36.0)
MCV: 99.5 fL (ref 79.5–101.0)
MONO#: 0.7 10*3/uL (ref 0.1–0.9)
MONO%: 9.4 % (ref 0.0–14.0)
NEUT#: 6.1 10*3/uL (ref 1.5–6.5)
NEUT%: 80.1 % — AB (ref 38.4–76.8)
PLATELETS: 202 10*3/uL (ref 145–400)
RBC: 3.67 10*6/uL — AB (ref 3.70–5.45)
RDW: 14.3 % (ref 11.2–14.5)
WBC: 7.6 10*3/uL (ref 3.9–10.3)
lymph#: 0.6 10*3/uL — ABNORMAL LOW (ref 0.9–3.3)

## 2015-09-15 MED ORDER — TAMOXIFEN CITRATE 20 MG PO TABS: 20.0000 mg | ORAL_TABLET | Freq: Every day | ORAL | 0 refills | Status: DC

## 2015-09-15 MED ORDER — OXYCODONE HCL 5 MG PO TABS: 5.0000 mg | ORAL_TABLET | ORAL | 0 refills | Status: DC | PRN

## 2015-09-15 MED FILL — oxyCODONE HCL 5 MG TABS: 5 | 8 days supply | Qty: 150 | Fill #0

## 2015-09-15 MED FILL — TAMOXIFEN 20 MG TABLET: 20 | 30 days supply | Qty: 30 | Fill #0

## 2015-09-15 NOTE — Telephone Encounter (Signed)
Patient sent back to lab and given avs report and appointments for September  °

## 2015-09-15 NOTE — Progress Notes (Signed)
[place] OFFICE PROGRESS NOTE   Diagnosis:  Breast cancer  INTERVAL HISTORY:   [name] returns as scheduled. She continues tamoxifen. She has had a few hot flashes. She continues to have mid to left back pain. Over the past 2 weeks she has had intermittent pain involving multiple other sites as well including the left upper arm and right forearm. The pain "comes and goes". At baseline she takes one Percocet every 4 hours. Many times she has to take 2 Percocet.  Objective:  Vital signs in last 24 hours:  Blood pressure 133/77, pulse 90, temperature 98.2 F (36.8 C), temperature source Oral, resp. rate 18, height '5\' 3"'  (1.6 m), weight 212 lb 4.8 oz (96.3 kg), SpO2 100 %.    HEENT: No thrush or ulcers. Resp: Lungs clear bilaterally. Cardio: Regular rate and rhythm. GI: Abdomen soft and nontender. Vascular: No leg edema. Musculoskeletal: Tender over the right forearm and left upper arm.    Lab Results:  Lab Results  Component Value Date   WBC 2.1 (L) 08/15/2015   HGB 11.2 (L) 08/15/2015   HCT 33.7 (L) 08/15/2015   MCV 101.5 (H) 08/15/2015   PLT 87 (L) 08/15/2015   NEUTROABS 1.3 (L) 08/15/2015    Imaging:  No results found.  Medications: I have reviewed the patient's current medications.  Assessment/Plan: 1. 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 ER positive, PR positive, HER-2 negative She completed 4 cycles of adjuvant AC chemotherapy June 15 through September 03, 2008, and then completed the left chest wall radiation. She began Arimidex following an office visit on November 11, 2008.  Bone scan 06/01/2013 suggestive of thoracic spine metastases, thoracic MRI 06/27/2011 consistent with multiple bone metastases involving the thoracolumbar spine   PET scan 07/09/2013 with multiple hypermetabolic bone lesions and  hypermetabolic mediastinal nodes.   Left iliac lesion biopsy 07/26/2013. Pathology showed metastatic carcinoma, ER positive, PR positive, HER-2 negative   Initiation of Faslodex 08/10/2013.  Xgeva every 3 months initiated 07/27/2013.  Restaging PET scan 01/23/2014 with no residual hypermetabolic mediastinal disease; marked improvement in the metastatic bone disease.  Restaging PET scan 02/17/2015 with recurrence of skeletal metastasis. New hypermetabolic lesions within the pelvis and ribs. No hypermetabolic lymphadenopathy.  Continuation of Faslodex; initiation of Ibrance 03/05/2015  Cycle 2 Ibrance 04/09/2015-dose reduced to 100 mg daily  Cycle 3 Ibrance 05/08/2015  Cycle 4 Ibrance 06/11/2015  Cycle 5 Ibrance 07/15/2015  PET scan 08/12/2015-increase in size and increased metabolic activity associated with bone metastases, no new lesions  Ibrance/Faslodex discontinued  Tamoxifen started 08/07/2015 2. Left chest subcutaneous mass noted on exam October 04, 2008, status post biopsy by Dr. Brantley Stage with a benign pathology. 3. History of delayed healing of the left mastectomy incision. 4. Rheumatoid arthritis. 5. Diabetes. 6. Hypercholesterolemia. 7. Hypertension. 8. Family history of breast cancer. She has been evaluated at the genetic screening clinic. 9. 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 10. 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 appears unchanged. She will continue tamoxifen.   She has noted intermittent pain involving multiple sites over the past 2 weeks. She feels that some of the pain may be related to rheumatoid arthritis. I changed her pain medication from Percocet 10/325  to oxycodone 5 mg with instructions to take 1-3 tablets every 4 hours as  needed. She will return for a follow-up visit in 2 weeks to reevaluate the pain. She understands to contact the office in the interim with poor pain control.    [name] ANP/GNP-BC   09/15/2015  10:21 AM

## 2015-09-29 ENCOUNTER — Telehealth: Payer: Self-pay | Admitting: Oncology

## 2015-09-29 ENCOUNTER — Ambulatory Visit (HOSPITAL_BASED_OUTPATIENT_CLINIC_OR_DEPARTMENT_OTHER): Payer: 59 | Admitting: Nurse Practitioner

## 2015-09-29 DIAGNOSIS — C7951 Secondary malignant neoplasm of bone: Secondary | ICD-10-CM

## 2015-09-29 DIAGNOSIS — C50919 Malignant neoplasm of unspecified site of unspecified female breast: Secondary | ICD-10-CM

## 2015-09-29 DIAGNOSIS — C50912 Malignant neoplasm of unspecified site of left female breast: Secondary | ICD-10-CM | POA: Diagnosis not present

## 2015-09-29 DIAGNOSIS — M069 Rheumatoid arthritis, unspecified: Secondary | ICD-10-CM

## 2015-09-29 MED ORDER — ALPRAZOLAM 0.5 MG PO TABS: 0.5000 mg | ORAL_TABLET | Freq: Two times a day (BID) | ORAL | 0 refills | Status: DC | PRN

## 2015-09-29 MED ORDER — OXYCODONE-ACETAMINOPHEN 10-325 MG PO TABS: 1.0000 | ORAL_TABLET | ORAL | 0 refills | Status: DC | PRN

## 2015-09-29 MED FILL — OXYCODONE-ACETAMINOPHEN 10-: 10-325 | 20 days supply | Qty: 120 | Fill #0

## 2015-09-29 MED FILL — ALPRAZolam 0.5 MG TABS: 0.5 | 30 days supply | Qty: 90 | Fill #0

## 2015-09-29 NOTE — Progress Notes (Addendum)
[place] OFFICE PROGRESS NOTE   Diagnosis:  Breast cancer  INTERVAL HISTORY:   [name] returns as scheduled. She continues tamoxifen. At the time of her last visit on 09/15/2015 she was experiencing intermittent pain at multiple sites. Pain medication was adjusted. She is seen today for reevaluation.  She reports the hand and arm pain is "rheumatoid-related". She would like to resume treatment for the RA. She was previously on [place]. She reports other pain involving the low back, left hip and ribs is unchanged. She feels that Percocet works better than plain oxycodone and would like to resume the Percocet.  Objective:  Vital signs in last 24 hours:  Blood pressure (!) 143/83, pulse 74, temperature 98.3 F (36.8 C), temperature source Oral, resp. rate 18, weight 214 lb 1.6 oz (97.1 kg), SpO2 100 %.    HEENT: No thrush or ulcers. Resp: Lungs clear bilaterally. Cardio: Regular rate and rhythm. GI: Abdomen soft and nontender. No hepatomegaly. Vascular: No leg edema. Musculoskeletal: Hands with joint changes consistent with rheumatoid arthritis.   Lab Results:  Lab Results  Component Value Date   WBC 7.6 09/15/2015   HGB 11.9 09/15/2015   HCT 36.6 09/15/2015   MCV 99.5 09/15/2015   PLT 202 09/15/2015   NEUTROABS 6.1 09/15/2015    Imaging:  No results found.  Medications: I have reviewed the patient's current medications.  Assessment/Plan: 1. 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 ER positive, PR positive, HER-2 negative She completed 4 cycles of adjuvant AC chemotherapy June 15 through September 03, 2008, and then completed the left chest wall radiation. She began Arimidex following an office visit on November 11, 2008.  Bone scan 06/01/2013 suggestive of thoracic spine metastases, thoracic MRI 06/27/2011 consistent  with multiple bone metastases involving the thoracolumbar spine   PET scan 07/09/2013 with multiple hypermetabolic bone lesions and hypermetabolic mediastinal nodes.   Left iliac lesion biopsy 07/26/2013. Pathology showed metastatic carcinoma, ER positive, PR positive, HER-2 negative   Initiation of Faslodex 08/10/2013.  Xgeva every 3 months initiated 07/27/2013.  Restaging PET scan 01/23/2014 with no residual hypermetabolic mediastinal disease; marked improvement in the metastatic bone disease.  Restaging PET scan 02/17/2015 with recurrence of skeletal metastasis. New hypermetabolic lesions within the pelvis and ribs. No hypermetabolic lymphadenopathy.  Continuation of Faslodex; initiation of Ibrance 03/05/2015  Cycle 2 Ibrance 04/09/2015-dose reduced to 100 mg daily  Cycle 3 Ibrance 05/08/2015  Cycle 4 Ibrance 06/11/2015  Cycle 5 Ibrance 07/15/2015  PET scan 08/12/2015-increase in size and increased metabolic activity associated with bone metastases, no new lesions  Ibrance/Faslodex discontinued  Tamoxifen started 08/07/2015 2. Left chest subcutaneous mass noted on exam October 04, 2008, status post biopsy by Dr. Brantley Stage with a benign pathology. 3. History of delayed healing of the left mastectomy incision. 4. Rheumatoid arthritis. 5. Diabetes. 6. Hypercholesterolemia. 7. Hypertension. 8. Family history of breast cancer. She has been evaluated at the genetic screening clinic. 9. 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 10. 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 appears stable. The increased pain involving the upper extremities is likely related to rheumatoid arthritis. The pain we feel is related to the metastatic breast cancer is stable. She will  continue tamoxifen.  She would like to resume treatment for the rheumatoid arthritis. She reports previously taking Arava and Plaquenil. She understands the use of some immunosuppressive medications may increase the risk of malignancies. She is willing to accept this risk. We will forward this note to her rheumatologist.  She will return for labs and a follow-up visit in 4 weeks. She will contact the office in the interim with any problems. She was given new prescriptions for Percocet and Xanax at today's visit.  Patient seen with Dr. Benay Spice.    [name] ANP/GNP-BC   09/29/2015  12:22 PM  This was a shared visit with [name]. She has a current flare of the rheumatoid arthritis. She understands the concern for progression of cancer while on immunosuppressive therapy. I favor treating the rheumatoid arthritis flare and continuing tamoxifen.  [name], M.D.

## 2015-09-29 NOTE — Telephone Encounter (Signed)
Avs report and appt schd given per 09/29/15 los. °

## 2015-10-13 ENCOUNTER — Other Ambulatory Visit: Payer: Self-pay | Admitting: Nurse Practitioner

## 2015-10-13 DIAGNOSIS — C50012 Malignant neoplasm of nipple and areola, left female breast: Secondary | ICD-10-CM

## 2015-10-14 ENCOUNTER — Telehealth: Payer: Self-pay | Admitting: *Deleted

## 2015-10-14 MED FILL — TAMOXIFEN 20 MG TABLET: 20 | 30 days supply | Qty: 30 | Fill #0

## 2015-10-14 NOTE — Telephone Encounter (Signed)
"  I need a Tamoxifen refill to pick up.  I do not use mail order.  I called my pharmacy yesterday and they have not heard anything about the refill."  Refill authorized at this time.

## 2015-10-27 ENCOUNTER — Other Ambulatory Visit: Payer: 59

## 2015-10-27 ENCOUNTER — Telehealth: Payer: Self-pay | Admitting: Oncology

## 2015-10-27 ENCOUNTER — Ambulatory Visit: Payer: 59 | Admitting: Nurse Practitioner

## 2015-10-27 ENCOUNTER — Ambulatory Visit (HOSPITAL_BASED_OUTPATIENT_CLINIC_OR_DEPARTMENT_OTHER): Payer: 59 | Admitting: Oncology

## 2015-10-27 ENCOUNTER — Ambulatory Visit: Payer: 59 | Admitting: Oncology

## 2015-10-27 ENCOUNTER — Other Ambulatory Visit (HOSPITAL_BASED_OUTPATIENT_CLINIC_OR_DEPARTMENT_OTHER): Payer: 59

## 2015-10-27 VITALS — BP 131/48 | HR 71 | Temp 98.1°F | Resp 17 | Ht 63.0 in | Wt 215.0 lb

## 2015-10-27 DIAGNOSIS — C50919 Malignant neoplasm of unspecified site of unspecified female breast: Secondary | ICD-10-CM

## 2015-10-27 DIAGNOSIS — C7951 Secondary malignant neoplasm of bone: Secondary | ICD-10-CM

## 2015-10-27 DIAGNOSIS — C50912 Malignant neoplasm of unspecified site of left female breast: Secondary | ICD-10-CM

## 2015-10-27 DIAGNOSIS — M069 Rheumatoid arthritis, unspecified: Secondary | ICD-10-CM

## 2015-10-27 DIAGNOSIS — Z23 Encounter for immunization: Secondary | ICD-10-CM

## 2015-10-27 DIAGNOSIS — I10 Essential (primary) hypertension: Secondary | ICD-10-CM

## 2015-10-27 DIAGNOSIS — M545 Low back pain: Secondary | ICD-10-CM | POA: Diagnosis not present

## 2015-10-27 DIAGNOSIS — E119 Type 2 diabetes mellitus without complications: Secondary | ICD-10-CM

## 2015-10-27 LAB — COMPREHENSIVE METABOLIC PANEL
ALT: 14 U/L (ref 0–55)
AST: 20 U/L (ref 5–34)
Albumin: 3.4 g/dL — ABNORMAL LOW (ref 3.5–5.0)
Alkaline Phosphatase: 93 U/L (ref 40–150)
Anion Gap: 9 mEq/L (ref 3–11)
BUN: 37.7 mg/dL — ABNORMAL HIGH (ref 7.0–26.0)
CHLORIDE: 109 meq/L (ref 98–109)
CO2: 23 meq/L (ref 22–29)
CREATININE: 1.5 mg/dL — AB (ref 0.6–1.1)
Calcium: 9 mg/dL (ref 8.4–10.4)
EGFR: 37 mL/min/{1.73_m2} — AB (ref >90)
GLUCOSE: 78 mg/dL (ref 70–140)
Potassium: 4.9 mEq/L (ref 3.5–5.1)
SODIUM: 141 meq/L (ref 136–145)
TOTAL PROTEIN: 7.1 g/dL (ref 6.4–8.3)

## 2015-10-27 LAB — CBC WITH DIFFERENTIAL/PLATELET
BASO%: 0.2 % (ref 0.0–2.0)
Basophils Absolute: 0 10*3/uL (ref 0.0–0.1)
EOS ABS: 0.2 10*3/uL (ref 0.0–0.5)
EOS%: 3.6 % (ref 0.0–7.0)
HCT: 36.8 % (ref 34.8–46.6)
HGB: 11.9 g/dL (ref 11.6–15.9)
LYMPH%: 10.6 % — AB (ref 14.0–49.7)
MCH: 30.7 pg (ref 25.1–34.0)
MCHC: 32.3 g/dL (ref 31.5–36.0)
MCV: 95.2 fL (ref 79.5–101.0)
MONO#: 0.5 10*3/uL (ref 0.1–0.9)
MONO%: 9.2 % (ref 0.0–14.0)
NEUT#: 4.2 10*3/uL (ref 1.5–6.5)
NEUT%: 76.4 % (ref 38.4–76.8)
PLATELETS: 170 10*3/uL (ref 145–400)
RBC: 3.87 10*6/uL (ref 3.70–5.45)
RDW: 14.2 % (ref 11.2–14.5)
WBC: 5.5 10*3/uL (ref 3.9–10.3)
lymph#: 0.6 10*3/uL — ABNORMAL LOW (ref 0.9–3.3)

## 2015-10-27 MED ORDER — ALPRAZOLAM 0.5 MG PO TABS: 0.5000 mg | ORAL_TABLET | Freq: Two times a day (BID) | ORAL | 0 refills | Status: DC | PRN

## 2015-10-27 MED ORDER — INFLUENZA VAC SPLIT QUAD 0.5 ML IM SUSY
0.5000 mL | PREFILLED_SYRINGE | Freq: Once | INTRAMUSCULAR | Status: AC
  Administered 2015-10-27: 0.5 mL via INTRAMUSCULAR
  Filled 2015-10-27: qty 0.5

## 2015-10-27 MED ORDER — OXYCODONE-ACETAMINOPHEN 10-325 MG PO TABS: 1.0000 | ORAL_TABLET | ORAL | 0 refills | Status: DC | PRN

## 2015-10-27 MED FILL — ALPRAZolam 0.5 MG TABS: 0.5 | 22 days supply | Qty: 90 | Fill #0

## 2015-10-27 MED FILL — OXYCODONE-APAP 10-325: 10-325 | 30 days supply | Qty: 180 | Fill #0

## 2015-10-27 NOTE — Progress Notes (Signed)
   [place] OFFICE PROGRESS NOTE   Diagnosis: Breast cancer  INTERVAL HISTORY:   She returns as scheduled. She continues tamoxifen. No bleeding or symptom of thrombosis. She reports stable pain in the back and "ribs ". She has arthritis pain at multiple sites. She takes oxycodone for relief of both cancer and arthritis-related pain. She takes 5-6 oxycodone tablets per day. She has resumed plaquenel, but this has not helped the arthritis.  Objective:  Vital signs in last 24 hours:  Blood pressure (!) 131/48, pulse 71, temperature 98.1 F (36.7 C), temperature source Oral, resp. rate 17, height '5\' 3"'$  (1.6 m), weight 215 lb (97.5 kg), SpO2 100 %.    Lymphatics: No cervical, supraclavicular, or axillary nodes Resp: Lungs clear bilaterally Cardio: Regular rate and rhythm GI: No hepatosplenomegaly Vascular: No leg edema Breast: Status post left mastectomy. No evidence for chest wall tumor recurrence.     Lab Results:  Lab Results  Component Value Date   WBC 5.5 10/27/2015   HGB 11.9 10/27/2015   HCT 36.8 10/27/2015   MCV 95.2 10/27/2015   PLT 170 10/27/2015   NEUTROABS 4.2 10/27/2015     Medications: I have reviewed the patient's current medications.  Assessment/Plan: 1. 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 ER positive, PR positive, HER-2 negative She completed 4 cycles of adjuvant AC chemotherapy June 15 through September 03, 2008, and then completed the left chest wall radiation. She began Arimidex following an office visit on November 11, 2008.  Bone scan 06/01/2013 suggestive of thoracic spine metastases, thoracic MRI 06/27/2011 consistent with multiple bone metastases involving the thoracolumbar spine   PET scan 07/09/2013 with multiple hypermetabolic bone lesions and hypermetabolic mediastinal nodes.   Left iliac  lesion biopsy 07/26/2013. Pathology showed metastatic carcinoma, ER positive, PR positive, HER-2 negative   Initiation of Faslodex 08/10/2013.  Xgeva every 3 months initiated 07/27/2013.  Restaging PET scan 01/23/2014 with no residual hypermetabolic mediastinal disease; marked improvement in the metastatic bone disease.  Restaging PET scan 02/17/2015 with recurrence of skeletal metastasis. New hypermetabolic lesions within the pelvis and ribs. No hypermetabolic lymphadenopathy.  Continuation of Faslodex; initiation of Ibrance 03/05/2015  Cycle 2 Ibrance 04/09/2015-dose reduced to 100 mg daily  Cycle 3 Ibrance 05/08/2015  Cycle 4 Ibrance 06/11/2015  Cycle 5 Ibrance 07/15/2015  PET scan 08/12/2015-increase in size and increased metabolic activity associated with bone metastases, no new lesions  Ibrance/Faslodex discontinued  Tamoxifen started 08/07/2015 2. Left chest subcutaneous mass noted on exam October 04, 2008, status post biopsy by Dr. Brantley Stage with a benign pathology. 3. History of delayed healing of the left mastectomy incision. 4. Rheumatoid arthritis. 5. Diabetes. 6. Hypercholesterolemia. 7. Hypertension. 8. Family history of breast cancer. She has been evaluated at the genetic screening clinic. 9. 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 10. 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 appears unchanged. She will continue tamoxifen. She will return for an office visit and xgeva in one month. She received an influenza vaccine today.  We refilled her prescription for oxycodone.  It is okay to resume from an oncology standpoint.  Betsy Coder, MD  10/27/2015  11:24 AM

## 2015-10-27 NOTE — Telephone Encounter (Signed)
Avs report and appointment schedule given to patient, per 10/27/15 los °

## 2015-10-28 LAB — CANCER ANTIGEN 27.29: CA 27.29: 42.7 U/mL — ABNORMAL HIGH (ref 0.0–38.6)

## 2015-10-30 ENCOUNTER — Other Ambulatory Visit: Payer: Self-pay | Admitting: Nurse Practitioner

## 2015-10-30 DIAGNOSIS — C50919 Malignant neoplasm of unspecified site of unspecified female breast: Secondary | ICD-10-CM

## 2015-10-30 DIAGNOSIS — C7951 Secondary malignant neoplasm of bone: Principal | ICD-10-CM

## 2015-11-11 ENCOUNTER — Other Ambulatory Visit: Payer: Self-pay | Admitting: Oncology

## 2015-11-11 DIAGNOSIS — C50012 Malignant neoplasm of nipple and areola, left female breast: Secondary | ICD-10-CM

## 2015-11-12 ENCOUNTER — Telehealth: Payer: Self-pay | Admitting: *Deleted

## 2015-11-12 MED FILL — TAMOXIFEN 20 MG TABLET: 20 | 30 days supply | Qty: 30 | Fill #0

## 2015-11-12 NOTE — Telephone Encounter (Signed)
Tamoxifen e-prescribed. Pt notified.

## 2015-11-12 NOTE — Telephone Encounter (Signed)
Received call @ 1045  From pt in regards to a refill  On Tamoxifen.Patient stated that she was completely out.

## 2015-11-22 DIAGNOSIS — M24521 Contracture, right elbow: Secondary | ICD-10-CM | POA: Insufficient documentation

## 2015-11-22 DIAGNOSIS — Z79899 Other long term (current) drug therapy: Secondary | ICD-10-CM | POA: Insufficient documentation

## 2015-11-22 DIAGNOSIS — N289 Disorder of kidney and ureter, unspecified: Secondary | ICD-10-CM | POA: Insufficient documentation

## 2015-11-22 DIAGNOSIS — M0579 Rheumatoid arthritis with rheumatoid factor of multiple sites without organ or systems involvement: Secondary | ICD-10-CM | POA: Insufficient documentation

## 2015-11-23 NOTE — Progress Notes (Signed)
*IMAGE* Office Visit Note  Patient: [name]             Date of Birth: Mar 08, 1950           MRN: [id number]             PCP: Tula Nakayama Referring: Aletha Halim., PA-C Visit Date: 11/25/2015 Occupation:@GUAROCC @    Subjective:  No chief complaint on file. Patient is flaring. Her right elbow and right hand is having significant pain that's affecting her sleep. She is on Plaquenil but she is off of [place].  History of Present Illness: [name] is a 65 y.o. female last seen in our office 08/25/2015. Patient states that she talked to Dr. Benay Spice who is okay with patient starting array of and/or Plaquenil. Patient reports that she is having some pain to the right second DIP joint consistent with a rheumatoid nodule. There is also rheumatoid not nodule noted on right fourth PIP joint. The fourth right PIP joint nodule is not painful but the right second DIP joint is a very tender nodule.  Complaining about right elbow joint pain going on since last couple of weeks.  Status post left breast mastectomy. Having a lot of pain from the scar tissue there. Patient cannot sleep on her back because of arthritis to her back and metastatic cancer to the back. Left side of her body cannot take any weight secondary to the scar tissues. Cannot rest on the right elbow joint at night or maneuver the right elbow joint secondary to the current flare of arthritis in her right elbow joint. Patient is requesting some kind of control of this and we discussed a prednisone taper and patient is agreeable.   On last visit, this is patients HPI ==> HPI:  [name] is a 65 year old female with seropositive rheumatoid arthritis.  She had a PET scan after her last visit, which showed metastatic disease in her pelvis, rib cage and spine.  She states she started chemotherapy in February 2017 and had a repeat PET scan in July.  She did not show any response to chemotherapy and her metastatic  disease has spread further into her bones.  Now it is in all of her pelvis, right knee joint, right rib cage, left arm, shoulders, lower back and also in her feet.  She states she has been started on tamoxifen and Dr. Benay Spice is looking at some other options at this point.  She has a followup visit with Dr. Benay Spice on July 21.  She states since she has been off Arava and Plaquenil her rheumatoid arthritis is flaring.  She has been having increased pain and swelling in her hands and elbow joints.  Her prednisone helps to some extent.  She is only on 1 mg a day.  She states she was feeling better off chemotherapy.  She was quite sick while she was on chemotherapy.  Her labs have been stable, which have been followed by Dr. Benay Spice.  She gives a history of morning stiffness and some discomfort walking.  On last visit, this pt's impression and plan:   IMPRESSION AND PLAN:  Rheumatoid arthritis with positive rheumatoid factor and positive CCP.  She has flaring off DMARDs.  She has been having increased pain and swelling in her hands and had synovitis on examination.  Her right elbow joint contracture is old.  She has stiffness in her feet as well.  She is only on prednisone 1 mg a day to control the  symptoms, which takes only the edge off.  She has a very complicated medical history with metastatic bone cancer and metastatic lesions in her bones, which causes a lot of pain and discomfort in her bones and also nocturnal pain.  He medical history is also complicated by diabetes, hypercholesterolemia and low GFR.  She has a history of vitamin D deficiency as well.  Her right total hip replacement and right total knee replacement is doing well.  We had a detailed discussion with her today.  At this point I would like to see if Dr. Benay Spice will allow her to take any of the DMARDs like Arava or Plaquenil to control her symptoms, or if we can increase her prednisone dose to make her more comfortable.  The challenge will  be her neutropenia and thrombocytopenia in addition to DMARD medication.  Also we discussed in case she starts chemotherapy, it may control her RA also.  Face-to-face time spent with the patient was 30 minutes, 50% of the time was spent in counseling and coordination of care.     Activities of Daily Living:  Patient reports morning stiffness for 30 minutes.   Patient Reports nocturnal pain.  Difficulty dressing/grooming: Reports Difficulty climbing stairs: Reports Difficulty getting out of chair: Reports Difficulty using hands for taps, buttons, cutlery, and/or writing: Reports   Review of Systems  Constitutional: Negative for fatigue.  HENT: Negative for mouth sores and mouth dryness.   Eyes: Negative for dryness.  Respiratory: Negative for shortness of breath.   Gastrointestinal: Negative for constipation and diarrhea.  Musculoskeletal: Negative for myalgias and myalgias.  Skin: Negative for sensitivity to sunlight.  Psychiatric/Behavioral: Negative for decreased concentration and sleep disturbance.    PMFS History:  Patient Active Problem List   Diagnosis Date Noted  . Seropositive rheumatoid arthritis of multiple sites ([place]) 11/22/2015  . 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 High risk medication use 11/22/2015  . Low kidney function 11/22/2015  . Breast cancer metastasized to bone ([place]) 07/27/2013  . History of breast cancer 02/04/2012  . Breast cancer ([place]) 01/04/2012    Past Medical History:  Diagnosis Date  . Arthritis   . Bone metastases ([place])    breast primary  . Breast cancer ([place])   . Cancer ([place])    left breast  . Diabetes mellitus   . History of radiation therapy 10/2008   left chest wall, left supraclavicular region, mastectomy scar  . Hyperlipidemia   . Hypertension     Family History  Problem Relation Age of Onset  . Diabetes Mother   . Hypertension Mother   . Heart disease Mother   . Diabetes Father   . Hypertension Father   . Heart disease  Father   . Cancer Sister     breast   Past Surgical History:  Procedure Laterality Date  . BONE DENSITY TEST  2008  . BREAST SURGERY    . CHOLECYSTECTOMY    . COLONOSCOPY  2007  . MASTECTOMY  2010   LEFT BREAST  . PORT-A-CATH REMOVAL    . PORTACATH PLACEMENT    . REPLACEMENT TOTAL KNEE  2008   RIGHT KNEE  . TOTAL HIP ARTHROPLASTY  2004   RIGHT HIP   Social History   Social History Narrative  . No narrative on file   On last visit:   Last visit:  FAMILY HISTORY:  Nothing new added.   Last visit:  SOCIAL HISTORY:  She is a nonsmoker.  Does not drink  any alcohol.  No coffee.  Does exercise 3 times a week.   Last visit:  CURRENT MEDICATIONS:  Folic acid, metformin, quinapril, Crestor, Voltaren gel, alprazolam, glipizide, oxycodone, tamoxifen and prednisone 1 mg.   Last visit:  MEDICATION ALLERGIES:  DOXYCYCLINE.  Objective: Vital Signs: BP 132/75 (BP Location: Right Wrist, Patient Position: Sitting, Cuff Size: Small)   Pulse 80   Resp 13   Ht 5' 3.25" (1.607 m)   Wt 209 lb (94.8 kg)   BMI 36.73 kg/m    Physical Exam  Constitutional: She is oriented to person, place, and time. She appears well-developed and well-nourished.  HENT:  Head: Normocephalic and atraumatic.  Eyes: EOM are normal. Pupils are equal, round, and reactive to light.  Cardiovascular: Normal rate, regular rhythm and normal heart sounds.  Exam reveals no gallop and no friction rub.   No murmur heard. Pulmonary/Chest: Effort normal and breath sounds normal. She has no wheezes. She has no rales.  Abdominal: Soft. Bowel sounds are normal. She exhibits no distension. There is no tenderness. There is no guarding. No hernia.  Musculoskeletal: Normal range of motion. She exhibits no edema, tenderness or deformity.  Lymphadenopathy:    She has no cervical adenopathy.  Neurological: She is alert and oriented to person, place, and time. Coordination normal.  Skin: Skin is warm and dry. Capillary refill takes  less than 2 seconds. No rash noted.  Psychiatric: She has a normal mood and affect. Her behavior is normal.  Nursing note and vitals reviewed.    Musculoskeletal Exam:  Decreased range of motion of bilateral shoulders with about 90 of abduction. Right elbow joint with moderate contracture. About 32 History of right total hip replacement and right total knee replacement  CDAI Exam: CDAI Homunculus Exam:   Tenderness:  RUE: ulnohumeral and radiohumeral Right hand: 2nd DIP  Swelling:  RUE: ulnohumeral and radiohumeral Right hand: 2nd MCP, 4th PIP and 2nd DIP Left hand: 1st MCP  Joint Counts:  CDAI Tender Joint count: 1 CDAI Swollen Joint count: 4  Global Assessments:  Patient Global Assessment: 3 Provider Global Assessment: 3  CDAI Calculated Score: 11    Investigation: Findings:     Stage 2B synchronous primary left sided breast cancer, both T2 lesions, 3 positive lymph nodes..See the written copy of this report in the patient's paper medical record.  These results did not interface directly into the electronic medical record and are summarized here. Chart for full details    MRI- cw PVNS PLQ eye exam 12/12/16WNL 02/17/15 Arava decreased to qod due to kidney function   ==============  Patient's labs are in Epic and we can see it from October 2017. Labs are normal. They are CBC with differential and CMP with GFR.  ==================  Visit Date: 08/25/2015  ________________________________________     Last visit:  INVESTIGATIONS:  Her last labs from August 15, 2015, showed a white cell count of 2.1.  Platelets 87.  Comprehensive metabolic panel showed creatinine of 1.7 and GFR of 37, which is stable.  Her RAPID-3 score was 3.7 today, which showed moderate severity.   ------------------------------------------------------------------     Imaging: No results found.  Speciality Comments: No specialty comments available.    Procedures:  No procedures  performed Allergies: Doxycycline   Assessment / Plan: Visit Diagnoses: History of left breast cancer  Seropositive rheumatoid arthritis of multiple sites ([place])  Contracture of right elbow  High risk medication use  Low kidney function   ==================== The following note  was taken from 09/29/2015 visit with Miss [name], nurse practitioner with Dr. Benay Spice.  Disposition: [name] appears stable. The increased pain involving the upper extremities is likely related to rheumatoid arthritis. The pain we feel is related to the metastatic breast cancer is stable. She will continue tamoxifen.  She would like to resume treatment for the rheumatoid arthritis. She reports previously taking Arava and Plaquenil. She understands the use of some immunosuppressive medications may increase the risk of malignancies. She is willing to accept this risk. We will forward this note to her rheumatologist.  She will return for labs and a follow-up visit in 4 weeks. She will contact the office in the interim with any problems. She was given new prescriptions for Percocet and Xanax at today's visit.  Patient seen with Dr. Benay Spice. [name] ANP/GNP-BC   ============================== Plan: --------------------- OK to Prescribe  Prednisone 5mg :  4po qAM x 4 days,  3po qAM x 4 days, 2po qAM x 4 days, 1po qAM x 4 days, 1/2po qAM x 4 days, then return to 1mg  prednisone every morning (that you are currently taking).  Take until all gone and exactly as Rx'd. If diabetic, watch sugar levels and have pcp make adjustments for better control if needed.  ------  Handout and consent to restart Arava  Continue Plaquenil 2 mg daily Continue prednisone 1 mg daily but interrupted with a taper of 5 mg 4 pills every morning for 4 days and then decrease as discussed  CBC with differential CMP with GFR every 2 months. She goes to the [facility] as well as her PCP. And she comes to see [place].  She will get the labs done at one of the sites and share with [place] as appropriate. However I will give her standing order..   Plaquenil eye exam will be due December 2017/ January 2018. Patient aware.  Orders: No orders of the defined types were placed in this encounter.  Meds ordered this encounter  Medications  . hydroxychloroquine (PLAQUENIL) 200 MG tablet    Sig: Take 1 tablet (200 mg total) by mouth daily.    Dispense:  90 tablet    Refill:  1  . predniSONE (DELTASONE) 1 MG tablet    Sig: Take 1 tablet (1 mg total) by mouth daily.    Dispense:  90 tablet    Refill:  1  . hydroxychloroquine (PLAQUENIL) 200 MG tablet    Sig: Take 200 mg by mouth daily.   . predniSONE (DELTASONE) 5 MG tablet    Sig: 4po qAM x 4 days,  3po qAM x 4 days, 2po qAM x 4 days, 1po qAM x 4 days, 1/2po qAM x 4 days, then return to 1mg  prednisone every morning (that you are currently taking). Take until all gone and exactly as Rx'd. If diabetic, watch sugar levels and have pcp make adjustments for better control if needed.    Dispense:  42 tablet    Refill:  0    Order Specific Question:   Supervising Provider    Answer:   Bo Merino [id number]  . leflunomide (ARAVA) 10 MG tablet    Sig: Take 1 tablet (10 mg total) by mouth daily.    Dispense:  30 tablet    Refill:  2    Order Specific Question:   Supervising Provider    Answer:   Bo Merino [phone]    Face-to-face time spent with patient was 40 minutes. 50% of time was spent in counseling  and coordination of care.  Follow-Up Instructions: Return for RA, HRRX (plq only), left breast cancer w/ mets to bones,.  I examined and evaluated the patient with Eliezer Lofts PA. The plan of care was discussed as noted above.  Bo Merino, MD

## 2015-11-25 ENCOUNTER — Ambulatory Visit (HOSPITAL_BASED_OUTPATIENT_CLINIC_OR_DEPARTMENT_OTHER): Payer: 59 | Admitting: Nurse Practitioner

## 2015-11-25 ENCOUNTER — Encounter: Payer: Self-pay | Admitting: Rheumatology

## 2015-11-25 ENCOUNTER — Ambulatory Visit (HOSPITAL_BASED_OUTPATIENT_CLINIC_OR_DEPARTMENT_OTHER): Payer: 59

## 2015-11-25 ENCOUNTER — Other Ambulatory Visit (HOSPITAL_BASED_OUTPATIENT_CLINIC_OR_DEPARTMENT_OTHER): Payer: 59

## 2015-11-25 ENCOUNTER — Ambulatory Visit (INDEPENDENT_AMBULATORY_CARE_PROVIDER_SITE_OTHER): Payer: 59 | Admitting: Rheumatology

## 2015-11-25 ENCOUNTER — Telehealth: Payer: Self-pay | Admitting: Oncology

## 2015-11-25 VITALS — BP 130/71 | HR 77 | Temp 98.1°F | Resp 18 | Ht 63.25 in | Wt 214.5 lb

## 2015-11-25 VITALS — BP 132/75 | HR 80 | Resp 13 | Ht 63.25 in | Wt 209.0 lb

## 2015-11-25 VITALS — BP 91/77 | HR 77 | Temp 98.3°F | Resp 16

## 2015-11-25 DIAGNOSIS — N289 Disorder of kidney and ureter, unspecified: Secondary | ICD-10-CM

## 2015-11-25 DIAGNOSIS — I10 Essential (primary) hypertension: Secondary | ICD-10-CM

## 2015-11-25 DIAGNOSIS — Z853 Personal history of malignant neoplasm of breast: Secondary | ICD-10-CM

## 2015-11-25 DIAGNOSIS — G893 Neoplasm related pain (acute) (chronic): Secondary | ICD-10-CM | POA: Diagnosis not present

## 2015-11-25 DIAGNOSIS — M0579 Rheumatoid arthritis with rheumatoid factor of multiple sites without organ or systems involvement: Secondary | ICD-10-CM

## 2015-11-25 DIAGNOSIS — C7951 Secondary malignant neoplasm of bone: Secondary | ICD-10-CM

## 2015-11-25 DIAGNOSIS — M24521 Contracture, right elbow: Secondary | ICD-10-CM | POA: Diagnosis not present

## 2015-11-25 DIAGNOSIS — C50919 Malignant neoplasm of unspecified site of unspecified female breast: Secondary | ICD-10-CM

## 2015-11-25 DIAGNOSIS — E119 Type 2 diabetes mellitus without complications: Secondary | ICD-10-CM | POA: Diagnosis not present

## 2015-11-25 DIAGNOSIS — C50912 Malignant neoplasm of unspecified site of left female breast: Secondary | ICD-10-CM

## 2015-11-25 DIAGNOSIS — Z79899 Other long term (current) drug therapy: Secondary | ICD-10-CM

## 2015-11-25 LAB — COMPREHENSIVE METABOLIC PANEL
ALBUMIN: 3.4 g/dL — AB (ref 3.5–5.0)
ALK PHOS: 106 U/L (ref 40–150)
ALT: 22 U/L (ref 0–55)
ANION GAP: 9 meq/L (ref 3–11)
AST: 23 U/L (ref 5–34)
BILIRUBIN TOTAL: 0.26 mg/dL (ref 0.20–1.20)
BUN: 34.6 mg/dL — ABNORMAL HIGH (ref 7.0–26.0)
CO2: 19 meq/L — AB (ref 22–29)
CREATININE: 1.4 mg/dL — AB (ref 0.6–1.1)
Calcium: 8.5 mg/dL (ref 8.4–10.4)
Chloride: 112 mEq/L — ABNORMAL HIGH (ref 98–109)
EGFR: 39 mL/min/{1.73_m2} — AB (ref >90)
Glucose: 110 mg/dl (ref 70–140)
Potassium: 5.1 mEq/L (ref 3.5–5.1)
Sodium: 140 mEq/L (ref 136–145)
TOTAL PROTEIN: 7.2 g/dL (ref 6.4–8.3)

## 2015-11-25 MED ORDER — PREDNISONE 1 MG PO TABS: 1.0000 mg | ORAL_TABLET | Freq: Every day | ORAL | 1 refills | Status: DC

## 2015-11-25 MED ORDER — PREDNISONE 5 MG PO TABS: ORAL_TABLET | ORAL | 0 refills | Status: DC

## 2015-11-25 MED ORDER — LEFLUNOMIDE 10 MG PO TABS: 10.0000 mg | ORAL_TABLET | Freq: Every day | ORAL | 2 refills | Status: DC

## 2015-11-25 MED ORDER — ALPRAZOLAM 0.5 MG PO TABS: 0.5000 mg | ORAL_TABLET | Freq: Two times a day (BID) | ORAL | 0 refills | Status: DC | PRN

## 2015-11-25 MED ORDER — DENOSUMAB 120 MG/1.7ML ~~LOC~~ SOLN
120.0000 mg | Freq: Once | SUBCUTANEOUS | Status: AC
  Administered 2015-11-25: 120 mg via SUBCUTANEOUS
  Filled 2015-11-25: qty 1.7

## 2015-11-25 MED ORDER — OXYCODONE-ACETAMINOPHEN 10-325 MG PO TABS: 1.0000 | ORAL_TABLET | ORAL | 0 refills | Status: DC | PRN

## 2015-11-25 MED ORDER — HYDROXYCHLOROQUINE SULFATE 200 MG PO TABS: 200.0000 mg | ORAL_TABLET | Freq: Every day | ORAL | 1 refills | Status: DC

## 2015-11-25 MED FILL — OXYCODONE-APAP 10-325: 10-325 | 30 days supply | Qty: 180 | Fill #0

## 2015-11-25 MED FILL — ALPRAZolam 0.5 MG TABS: 0.5 | 30 days supply | Qty: 90 | Fill #0

## 2015-11-25 NOTE — Progress Notes (Signed)
Pharmacy Note  Subjective: Patient presents today to the [facility] to see Dr. Lacy Duverney. [place].  Patient seen by the pharmacist for counseling on leflunomide Jolee Ewing).    Objective: CBC    Component Value Date/Time   WBC 5.5 10/27/2015 1026   WBC 4.9 07/26/2013 0745   RBC 3.87 10/27/2015 1026   RBC 4.24 07/26/2013 0745   HGB 11.9 10/27/2015 1026   HCT 36.8 10/27/2015 1026   PLT 170 10/27/2015 1026   MCV 95.2 10/27/2015 1026   MCH 30.7 10/27/2015 1026   MCH 28.8 07/26/2013 0745   MCHC 32.3 10/27/2015 1026   MCHC 32.3 07/26/2013 0745   RDW 14.2 10/27/2015 1026   LYMPHSABS 0.6 (L) 10/27/2015 1026   MONOABS 0.5 10/27/2015 1026   EOSABS 0.2 10/27/2015 1026   BASOSABS 0.0 10/27/2015 1026    CMP     Component Value Date/Time   NA 141 10/27/2015 1026   K 4.9 10/27/2015 1026   CL 107 08/13/2008 1026   CO2 23 10/27/2015 1026   GLUCOSE 78 10/27/2015 1026   BUN 37.7 (H) 10/27/2015 1026   CREATININE 1.5 (H) 10/27/2015 1026   CALCIUM 9.0 10/27/2015 1026   PROT 7.1 10/27/2015 1026   ALBUMIN 3.4 (L) 10/27/2015 1026   AST 20 10/27/2015 1026   ALT 14 10/27/2015 1026   ALKPHOS 93 10/27/2015 1026   BILITOT <0.30 10/27/2015 1026   GFRNONAA 55 (L) 07/01/2008 0800   GFRAA  07/01/2008 0800    >60        The eGFR has been calculated using the MDRD equation. This calculation has not been validated in all clinical situations. eGFR's persistently <60 mL/min signify possible Chronic Kidney Disease.    TB Gold: negative (05/02/2012) Hepatitis panel: negative (05/02/2012) Pregnancy status:  postmenopausal  Vitals:   11/25/15 1047  BP: 132/75  Pulse: 80  Resp: 13    Assessment/Plan: Patient is being initiated on leflunomide (Arava) 10 mg daily.  Patient was counseled on the purpose, proper use, and adverse effects of leflunomide including risk of infection, nausea/diarrhea/weight loss, increase in blood pressure, rash, hair loss, tingling in the hands and  feet, and signs and symptoms of interstitial lung disease.  Discussed the importance of frequent monitoring of liver function and blood counts, and patient voiced understanding.  Provided patient with educational materials on leflunomide and answered all questions.  Patient consented to [place] use, and consent will be uploaded into the media tab.    Elisabeth Most, Pharm.D., BCPS Clinical Pharmacist Pager: [phone] Phone: [phone] 11/25/2015 12:25 PM

## 2015-11-25 NOTE — Telephone Encounter (Signed)
Gave patient avs report an appointments for December. Per patient injection q21mo - not needed w/12/5 visit.

## 2015-11-25 NOTE — Patient Instructions (Signed)
Leflunomide tablets  What is this medicine?  LEFLUNOMIDE (le FLOO na mide) is for rheumatoid arthritis.  This medicine may be used for other purposes; ask your health care provider or pharmacist if you have questions.  What should I tell my health care provider before I take this medicine?  They need to know if you have any of these conditions:  -alcoholism  -bone marrow problems  -fever or infection  -immune system problems  -kidney disease  -liver disease  -an unusual or allergic reaction to leflunomide, teriflunomide, other medicines, lactose, foods, dyes, or preservatives  -pregnant or trying to get pregnant  -breast-feeding  How should I use this medicine?  Take this medicine by mouth with a full glass of water. Follow the directions on the prescription label. Take your medicine at regular intervals. Do not take your medicine more often than directed. Do not stop taking except on your doctor's advice.  Talk to your pediatrician regarding the use of this medicine in children. Special care may be needed.  Overdosage: If you think you have taken too much of this medicine contact a poison control center or emergency room at once.  NOTE: This medicine is only for you. Do not share this medicine with others.  What if I miss a dose?  If you miss a dose, take it as soon as you can. If it is almost time for your next dose, take only that dose. Do not take double or extra doses.  What may interact with this medicine?  Do not take this medicine with any of the following medications:  -teriflunomide  This medicine may also interact with the following medications:  -charcoal  -cholestyramine  -methotrexate  -NSAIDs, medicines for pain and inflammation, like ibuprofen or naproxen  -phenytoin  -rifampin  -tolbutamide  -vaccines  -warfarin  This list may not describe all possible interactions. Give your health care provider a list of all the medicines, herbs, non-prescription drugs, or dietary supplements you use. Also tell  them if you smoke, drink alcohol, or use illegal drugs. Some items may interact with your medicine.  What should I watch for while using this medicine?  Visit your doctor or health care professional for regular checks on your progress. You will need frequent blood checks while you are receiving the medicine.  If you get a cold or other infection while receiving this medicine, call your doctor or health care professional. Do not treat yourself. The medicine may increase your risk of getting an infection.  If you are a woman who has the potential to become pregnant, discuss birth control options with your doctor or health care professional. You must not be pregnant, and you must be using a reliable form of birth control. The medicine may harm an unborn baby. Immediately call your doctor if you think you might be pregnant.  Alcoholic drinks may increase possible damage to your liver. Do not drink alcohol while taking this medicine.  What side effects may I notice from receiving this medicine?  Side effects that you should report to your doctor or health care professional as soon as possible:  -allergic reactions like skin rash, itching or hives, swelling of the face, lips, or tongue  -cough  -difficulty breathing or shortness of breath  -fever, chills or any other sign of infection  -redness, blistering, peeling or loosening of the skin, including inside the mouth  -unusual bleeding or bruising  -unusually weak or tired  -vomiting  -yellowing of eyes or skin    Side effects that usually do not require medical attention (report to your doctor or health care professional if they continue or are bothersome):  -diarrhea  -hair loss  -headache  -nausea  This list may not describe all possible side effects. Call your doctor for medical advice about side effects. You may report side effects to FDA at 1-800-FDA-1088.  Where should I keep my medicine?  Keep out of the reach of children.  Store at room temperature between 15 and  30 degrees C (59 and 86 degrees F). Protect from moisture and light. Throw away any unused medicine after the expiration date.  NOTE: This sheet is a summary. It may not cover all possible information. If you have questions about this medicine, talk to your doctor, pharmacist, or health care provider.     © 2016, [employer]. (2013-01-02 10:53:11)

## 2015-11-25 NOTE — Progress Notes (Signed)
[name] OFFICE PROGRESS NOTE   Diagnosis:  Breast cancer  INTERVAL HISTORY:   [name] returns as scheduled. She continues tamoxifen. She feels the hip and low back pain are overall controlled with Percocet. She continues to have arthritis pain at multiple sites. She reports she will be beginning a prednisone taper and resuming Arava in the near future. She denies significant hot flashes. No vaginal bleeding. No symptoms of thrombosis.  Objective:  Vital signs in last 24 hours:  Blood pressure 130/71, pulse 77, temperature 98.1 F (36.7 C), temperature source Oral, resp. rate 18, height 5' 3.25" (1.607 m), weight 214 lb 8 oz (97.3 kg), SpO2 100 %.    HEENT: No thrush or ulcers. Resp: Lungs clear bilaterally. Cardio: Regular rate and rhythm. GI: Abdomen soft and nontender. No organomegaly. Vascular: No leg edema. Breasts: Status post left mastectomy. No evidence for chest wall tumor recurrence.   Lab Results:  Lab Results  Component Value Date   WBC 5.5 10/27/2015   HGB 11.9 10/27/2015   HCT 36.8 10/27/2015   MCV 95.2 10/27/2015   PLT 170 10/27/2015   NEUTROABS 4.2 10/27/2015    Imaging:  No results found.  Medications: I have reviewed the patient's current medications.  Assessment/Plan:  1. 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 ER positive, PR positive, HER-2 negative She completed 4 cycles of adjuvant AC chemotherapy June 15 through September 03, 2008, and then completed the left chest wall radiation. She began Arimidex following an office visit on November 11, 2008.  Bone scan 06/01/2013 suggestive of thoracic spine metastases, thoracic MRI 06/27/2011 consistent with multiple bone metastases involving the thoracolumbar spine   PET scan 07/09/2013 with multiple hypermetabolic bone lesions and hypermetabolic  mediastinal nodes.   Left iliac lesion biopsy 07/26/2013. Pathology showed metastatic carcinoma, ER positive, PR positive, HER-2 negative   Initiation of Faslodex 08/10/2013.  Xgeva every 3 months initiated 07/27/2013.  Restaging PET scan 01/23/2014 with no residual hypermetabolic mediastinal disease; marked improvement in the metastatic bone disease.  Restaging PET scan 02/17/2015 with recurrence of skeletal metastasis. New hypermetabolic lesions within the pelvis and ribs. No hypermetabolic lymphadenopathy.  Continuation of Faslodex; initiation of Ibrance 03/05/2015  Cycle 2 Ibrance 04/09/2015-dose reduced to 100 mg daily  Cycle 3 Ibrance 05/08/2015  Cycle 4 Ibrance 06/11/2015  Cycle 5 Ibrance 07/15/2015  PET scan 08/12/2015-increase in size and increased metabolic activity associated with bone metastases, no new lesions  Ibrance/Faslodex discontinued  Tamoxifen started 08/07/2015 2. Left chest subcutaneous mass noted on exam October 04, 2008, status post biopsy by Dr. Brantley Stage with a benign pathology. 3. History of delayed healing of the left mastectomy incision. 4. Rheumatoid arthritis. 5. Diabetes. 6. Hypercholesterolemia. 7. Hypertension. 8. Family history of breast cancer. She has been evaluated at the genetic screening clinic. 9. 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 10. 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 She will continue tamoxifen. We will follow-up on the CA-27-29 from today. She will receive Xgeva today.  She was issued a new prescription for Percocet at today's visit.  She will return for labs and a follow-up visit in one month. She will contact the office in the interim with any problems.  [name] ANP/GNP-BC     11/25/2015  1:45 PM

## 2015-11-26 LAB — CANCER ANTIGEN 27.29: CA 27.29: 46.9 U/mL — ABNORMAL HIGH (ref 0.0–38.6)

## 2015-12-09 ENCOUNTER — Other Ambulatory Visit: Payer: Self-pay | Admitting: Oncology

## 2015-12-09 DIAGNOSIS — C50012 Malignant neoplasm of nipple and areola, left female breast: Secondary | ICD-10-CM

## 2015-12-10 MED FILL — TAMOXIFEN 20 MG TABLET: 20 | 30 days supply | Qty: 30 | Fill #0

## 2015-12-23 ENCOUNTER — Telehealth: Payer: Self-pay | Admitting: Oncology

## 2015-12-23 ENCOUNTER — Ambulatory Visit (HOSPITAL_BASED_OUTPATIENT_CLINIC_OR_DEPARTMENT_OTHER): Payer: 59 | Admitting: Oncology

## 2015-12-23 ENCOUNTER — Other Ambulatory Visit: Payer: Self-pay | Admitting: *Deleted

## 2015-12-23 ENCOUNTER — Other Ambulatory Visit (HOSPITAL_BASED_OUTPATIENT_CLINIC_OR_DEPARTMENT_OTHER): Payer: 59

## 2015-12-23 VITALS — BP 127/43 | HR 85 | Temp 97.6°F | Resp 18 | Ht 63.25 in | Wt 216.8 lb

## 2015-12-23 DIAGNOSIS — C7951 Secondary malignant neoplasm of bone: Secondary | ICD-10-CM

## 2015-12-23 DIAGNOSIS — G893 Neoplasm related pain (acute) (chronic): Secondary | ICD-10-CM

## 2015-12-23 DIAGNOSIS — M069 Rheumatoid arthritis, unspecified: Secondary | ICD-10-CM | POA: Diagnosis not present

## 2015-12-23 DIAGNOSIS — C50919 Malignant neoplasm of unspecified site of unspecified female breast: Secondary | ICD-10-CM

## 2015-12-23 DIAGNOSIS — E119 Type 2 diabetes mellitus without complications: Secondary | ICD-10-CM

## 2015-12-23 DIAGNOSIS — C50912 Malignant neoplasm of unspecified site of left female breast: Secondary | ICD-10-CM

## 2015-12-23 DIAGNOSIS — I10 Essential (primary) hypertension: Secondary | ICD-10-CM

## 2015-12-23 LAB — CBC WITH DIFFERENTIAL/PLATELET
BASO%: 0.2 % (ref 0.0–2.0)
BASOS ABS: 0 10*3/uL (ref 0.0–0.1)
EOS ABS: 0.1 10*3/uL (ref 0.0–0.5)
EOS%: 2.4 % (ref 0.0–7.0)
HCT: 37.2 % (ref 34.8–46.6)
HGB: 11.9 g/dL (ref 11.6–15.9)
LYMPH%: 8.8 % — AB (ref 14.0–49.7)
MCH: 29.6 pg (ref 25.1–34.0)
MCHC: 32 g/dL (ref 31.5–36.0)
MCV: 92.7 fL (ref 79.5–101.0)
MONO#: 0.5 10*3/uL (ref 0.1–0.9)
MONO%: 8.7 % (ref 0.0–14.0)
NEUT%: 79.9 % — ABNORMAL HIGH (ref 38.4–76.8)
NEUTROS ABS: 4.4 10*3/uL (ref 1.5–6.5)
Platelets: 149 10*3/uL (ref 145–400)
RBC: 4.01 10*6/uL (ref 3.70–5.45)
RDW: 14.8 % — ABNORMAL HIGH (ref 11.2–14.5)
WBC: 5.5 10*3/uL (ref 3.9–10.3)
lymph#: 0.5 10*3/uL — ABNORMAL LOW (ref 0.9–3.3)

## 2015-12-23 LAB — COMPREHENSIVE METABOLIC PANEL
ALT: 24 U/L (ref 0–55)
AST: 26 U/L (ref 5–34)
Albumin: 3.3 g/dL — ABNORMAL LOW (ref 3.5–5.0)
Alkaline Phosphatase: 110 U/L (ref 40–150)
Anion Gap: 11 mEq/L (ref 3–11)
BUN: 33.5 mg/dL — ABNORMAL HIGH (ref 7.0–26.0)
CO2: 20 meq/L — AB (ref 22–29)
Calcium: 8.4 mg/dL (ref 8.4–10.4)
Chloride: 109 mEq/L (ref 98–109)
Creatinine: 1.5 mg/dL — ABNORMAL HIGH (ref 0.6–1.1)
EGFR: 38 mL/min/{1.73_m2} — AB (ref >90)
GLUCOSE: 130 mg/dL (ref 70–140)
POTASSIUM: 4.7 meq/L (ref 3.5–5.1)
SODIUM: 140 meq/L (ref 136–145)
Total Bilirubin: 0.23 mg/dL (ref 0.20–1.20)
Total Protein: 7 g/dL (ref 6.4–8.3)

## 2015-12-23 MED ORDER — OXYCODONE-ACETAMINOPHEN 10-325 MG PO TABS: 1.0000 | ORAL_TABLET | ORAL | 0 refills | Status: DC | PRN

## 2015-12-23 MED ORDER — ALPRAZOLAM 0.5 MG PO TABS: 0.5000 mg | ORAL_TABLET | Freq: Two times a day (BID) | ORAL | 0 refills | Status: DC | PRN

## 2015-12-23 MED FILL — OXYCODONE-APAP 10-325: 10-325 | 30 days supply | Qty: 180 | Fill #0

## 2015-12-23 MED FILL — ALPRAZolam 0.5 MG TABS: 0.5 | 30 days supply | Qty: 90 | Fill #0

## 2015-12-23 NOTE — Progress Notes (Signed)
  [facility] OFFICE PROGRESS NOTE   Diagnosis: Breast cancer  INTERVAL HISTORY:   [name] returns as scheduled. She continues tamoxifen. She resumed arava and completed a prednisone Dosepak. The arthritis pain is much better. She has returned to exercising at the gym. She continues tamoxifen. No bleeding or symptom of thrombosis. No palpable change over the chest wall.  Objective:  Vital signs in last 24 hours:  Blood pressure (!) 127/43, pulse 85, temperature 97.6 F (36.4 C), temperature source Oral, resp. rate 18, height 5' 3.25" (1.607 m), weight 216 lb 12.8 oz (98.3 kg), SpO2 98 %.    HEENT: Neck Without mass Lymphatics: No cervical, supra-clavicular, or axillary nodes Resp: Lungs clear bilaterally Cardio: Regular rate and rhythm GI: No hepatomegaly Vascular: No leg edema Breast: Status post left mastectomy. No evidence for chest wall tumor recurrence.   Lab Results:  Lab Results  Component Value Date   WBC 5.5 12/23/2015   HGB 11.9 12/23/2015   HCT 37.2 12/23/2015   MCV 92.7 12/23/2015   PLT 149 12/23/2015   NEUTROABS 4.4 12/23/2015     Medications: I have reviewed the patient's current medications.  Assessment/plan: 1. 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 ER positive, PR positive, HER-2 negative She completed 4 cycles of adjuvant AC chemotherapy June 15 through September 03, 2008, and then completed the left chest wall radiation. She began Arimidex following an office visit on November 11, 2008.  Bone scan 06/01/2013 suggestive of thoracic spine metastases, thoracic MRI 06/27/2011 consistent with multiple bone metastases involving the thoracolumbar spine   PET scan 07/09/2013 with multiple hypermetabolic bone lesions and hypermetabolic mediastinal nodes.   Left iliac lesion biopsy 07/26/2013. Pathology showed  metastatic carcinoma, ER positive, PR positive, HER-2 negative   Initiation of Faslodex 08/10/2013.  Xgeva every 3 months initiated 07/27/2013.  Restaging PET scan 01/23/2014 with no residual hypermetabolic mediastinal disease; marked improvement in the metastatic bone disease.  Restaging PET scan 02/17/2015 with recurrence of skeletal metastasis. New hypermetabolic lesions within the pelvis and ribs. No hypermetabolic lymphadenopathy.  Continuation of Faslodex; initiation of Ibrance 03/05/2015  Cycle 2 Ibrance 04/09/2015-dose reduced to 100 mg daily  Cycle 3 Ibrance 05/08/2015  Cycle 4 Ibrance 06/11/2015  Cycle 5 Ibrance 07/15/2015  PET scan 08/12/2015-increase in size and increased metabolic activity associated with bone metastases, no new lesions  Ibrance/Faslodex discontinued  Tamoxifen started 08/07/2015 2. Left chest subcutaneous mass noted on exam October 04, 2008, status post biopsy by Dr. Brantley Stage with a benign pathology. 3. History of delayed healing of the left mastectomy incision. 4. Rheumatoid arthritis. 5. Diabetes. 6. Hypercholesterolemia. 7. Hypertension. 8. Family history of breast cancer. She has been evaluated at the genetic screening clinic. 9. 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 10. 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 She will be scheduled for a restaging PET scan prior to an office visit next month.  Her pain appears much improved since resuming treatment for rheumatoid arthritis and completing a steroid Dosepak.  She continues every three-month xgeva.  Betsy Coder, MD  12/23/2015  11:00 AM

## 2015-12-23 NOTE — Telephone Encounter (Signed)
Appointments scheduled per 12/5 LOS. Patient given AVS report and calendars with future scheduled appointments. Patient aware of PET scan to be scheduled.

## 2015-12-24 ENCOUNTER — Telehealth: Payer: Self-pay | Admitting: *Deleted

## 2015-12-24 LAB — CANCER ANTIGEN 27.29: CAN 27.29: 41.7 U/mL — AB (ref 0.0–38.6)

## 2015-12-24 NOTE — Telephone Encounter (Signed)
-----   Message from Ladell Pier, MD sent at 12/24/2015  3:05 PM EST ----- Please call patient, ca27.29 is better

## 2015-12-24 NOTE — Telephone Encounter (Signed)
Per Dr. Benay Spice, patient notified that Ca 27.29 is better.  Patient appreciative of call and has no questions or concerns at this time.

## 2016-01-07 ENCOUNTER — Other Ambulatory Visit: Payer: Self-pay | Admitting: Oncology

## 2016-01-07 DIAGNOSIS — C50012 Malignant neoplasm of nipple and areola, left female breast: Secondary | ICD-10-CM

## 2016-01-08 MED FILL — TAMOXIFEN 20 MG TABLET: 20 | 30 days supply | Qty: 30 | Fill #0

## 2016-01-20 ENCOUNTER — Telehealth: Payer: Self-pay | Admitting: *Deleted

## 2016-01-20 DIAGNOSIS — C50919 Malignant neoplasm of unspecified site of unspecified female breast: Secondary | ICD-10-CM

## 2016-01-20 DIAGNOSIS — C7951 Secondary malignant neoplasm of bone: Principal | ICD-10-CM

## 2016-01-20 MED ORDER — OXYCODONE-ACETAMINOPHEN 10-325 MG PO TABS: 1.0000 | ORAL_TABLET | ORAL | 0 refills | Status: DC | PRN

## 2016-01-20 NOTE — Telephone Encounter (Signed)
Pt requests refill on Percocet 10-325 mg.  She asks to be called when Rx is ready to pick up.

## 2016-01-20 NOTE — Addendum Note (Signed)
Addended by: Brien Few on: 01/20/2016 01:27 PM   Modules accepted: Orders

## 2016-01-21 MED FILL — OXYCODONE-APAP 10-325: 10-325 | 30 days supply | Qty: 180 | Fill #0

## 2016-01-29 ENCOUNTER — Telehealth: Payer: Self-pay | Admitting: *Deleted

## 2016-01-29 NOTE — Telephone Encounter (Signed)
Returned call to pt, informed her managed care is working on prior [place]. She voiced appreciation call.

## 2016-02-02 NOTE — Telephone Encounter (Signed)
Informed pt that prior auth has been obtained for PET. She voiced appreciation. Appts confirmed.

## 2016-02-04 ENCOUNTER — Ambulatory Visit (HOSPITAL_COMMUNITY)
Admission: RE | Admit: 2016-02-04 | Discharge: 2016-02-04 | Disposition: A | Discharge status: Home / Self Care | Payer: Medicare Other | Source: Ambulatory Visit | Attending: Oncology | Admitting: Oncology

## 2016-02-04 ENCOUNTER — Other Ambulatory Visit (HOSPITAL_BASED_OUTPATIENT_CLINIC_OR_DEPARTMENT_OTHER): Payer: Medicare Other

## 2016-02-04 DIAGNOSIS — J320 Chronic maxillary sinusitis: Secondary | ICD-10-CM | POA: Insufficient documentation

## 2016-02-04 DIAGNOSIS — C7951 Secondary malignant neoplasm of bone: Secondary | ICD-10-CM | POA: Insufficient documentation

## 2016-02-04 DIAGNOSIS — C50919 Malignant neoplasm of unspecified site of unspecified female breast: Secondary | ICD-10-CM

## 2016-02-04 DIAGNOSIS — I2510 Atherosclerotic heart disease of native coronary artery without angina pectoris: Secondary | ICD-10-CM | POA: Diagnosis not present

## 2016-02-04 DIAGNOSIS — C50912 Malignant neoplasm of unspecified site of left female breast: Secondary | ICD-10-CM | POA: Diagnosis not present

## 2016-02-04 DIAGNOSIS — C787 Secondary malignant neoplasm of liver and intrahepatic bile duct: Secondary | ICD-10-CM | POA: Insufficient documentation

## 2016-02-04 LAB — CBC WITH DIFFERENTIAL/PLATELET
BASO%: 0 % (ref 0.0–2.0)
Basophils Absolute: 0 10*3/uL (ref 0.0–0.1)
EOS%: 2.4 % (ref 0.0–7.0)
Eosinophils Absolute: 0.1 10*3/uL (ref 0.0–0.5)
HEMATOCRIT: 35.1 % (ref 34.8–46.6)
HGB: 11.3 g/dL — ABNORMAL LOW (ref 11.6–15.9)
LYMPH#: 0.6 10*3/uL — AB (ref 0.9–3.3)
LYMPH%: 12.8 % — AB (ref 14.0–49.7)
MCH: 29.7 pg (ref 25.1–34.0)
MCHC: 32.2 g/dL (ref 31.5–36.0)
MCV: 92.1 fL (ref 79.5–101.0)
MONO#: 0.8 10*3/uL (ref 0.1–0.9)
MONO%: 15.6 % — ABNORMAL HIGH (ref 0.0–14.0)
NEUT%: 69.2 % (ref 38.4–76.8)
NEUTROS ABS: 3.5 10*3/uL (ref 1.5–6.5)
Platelets: 146 10*3/uL (ref 145–400)
RBC: 3.81 10*6/uL (ref 3.70–5.45)
RDW: 14.3 % (ref 11.2–14.5)
WBC: 5 10*3/uL (ref 3.9–10.3)

## 2016-02-04 LAB — COMPREHENSIVE METABOLIC PANEL
ALT: 17 U/L (ref 0–55)
AST: 20 U/L (ref 5–34)
Albumin: 3.5 g/dL (ref 3.5–5.0)
Alkaline Phosphatase: 92 U/L (ref 40–150)
Anion Gap: 10 mEq/L (ref 3–11)
BILIRUBIN TOTAL: 0.3 mg/dL (ref 0.20–1.20)
BUN: 29.3 mg/dL — AB (ref 7.0–26.0)
CHLORIDE: 107 meq/L (ref 98–109)
CO2: 21 meq/L — AB (ref 22–29)
CREATININE: 1.6 mg/dL — AB (ref 0.6–1.1)
Calcium: 10.2 mg/dL (ref 8.4–10.4)
EGFR: 33 mL/min/{1.73_m2} — ABNORMAL LOW (ref >90)
Glucose: 79 mg/dl (ref 70–140)
Potassium: 5.1 mEq/L (ref 3.5–5.1)
Sodium: 138 mEq/L (ref 136–145)
TOTAL PROTEIN: 7 g/dL (ref 6.4–8.3)

## 2016-02-04 LAB — GLUCOSE, CAPILLARY: GLUCOSE-CAPILLARY: 90 mg/dL (ref 65–99)

## 2016-02-04 MED ORDER — FLUDEOXYGLUCOSE F - 18 (FDG) INJECTION
10.8200 | Freq: Once | INTRAVENOUS | Status: AC | PRN
  Administered 2016-02-04: 10.82 via INTRAVENOUS

## 2016-02-05 ENCOUNTER — Ambulatory Visit: Payer: 59 | Admitting: Nurse Practitioner

## 2016-02-06 ENCOUNTER — Telehealth: Payer: Self-pay | Admitting: Oncology

## 2016-02-06 ENCOUNTER — Ambulatory Visit (HOSPITAL_BASED_OUTPATIENT_CLINIC_OR_DEPARTMENT_OTHER): Payer: Medicare Other | Admitting: Nurse Practitioner

## 2016-02-06 VITALS — BP 148/73 | HR 89 | Temp 98.2°F | Resp 18 | Ht 63.25 in | Wt 210.2 lb

## 2016-02-06 DIAGNOSIS — K769 Liver disease, unspecified: Secondary | ICD-10-CM

## 2016-02-06 DIAGNOSIS — C50912 Malignant neoplasm of unspecified site of left female breast: Secondary | ICD-10-CM | POA: Diagnosis not present

## 2016-02-06 DIAGNOSIS — G893 Neoplasm related pain (acute) (chronic): Secondary | ICD-10-CM

## 2016-02-06 DIAGNOSIS — M545 Low back pain: Secondary | ICD-10-CM

## 2016-02-06 DIAGNOSIS — I10 Essential (primary) hypertension: Secondary | ICD-10-CM

## 2016-02-06 DIAGNOSIS — M069 Rheumatoid arthritis, unspecified: Secondary | ICD-10-CM

## 2016-02-06 DIAGNOSIS — C7951 Secondary malignant neoplasm of bone: Secondary | ICD-10-CM | POA: Diagnosis not present

## 2016-02-06 DIAGNOSIS — E119 Type 2 diabetes mellitus without complications: Secondary | ICD-10-CM

## 2016-02-06 DIAGNOSIS — C50919 Malignant neoplasm of unspecified site of unspecified female breast: Secondary | ICD-10-CM

## 2016-02-06 LAB — CANCER ANTIGEN 27.29: CAN 27.29: 53.2 U/mL — AB (ref 0.0–38.6)

## 2016-02-06 NOTE — Progress Notes (Addendum)
[facility] OFFICE PROGRESS NOTE   Diagnosis:  Breast cancer  INTERVAL HISTORY:   [name] returns as scheduled. She continues tamoxifen. Arthritis pain continues to be improved. She has persistent low back pain. She denies nausea/vomiting. Bowels moving regularly. No change over the chest wall.  Objective:  Vital signs in last 24 hours:  Blood pressure (!) 148/73, pulse 89, temperature 98.2 F (36.8 C), temperature source Oral, resp. rate 18, height 5' 3.25" (1.607 m), weight 210 lb 3.2 oz (95.3 kg), SpO2 100 %.    HEENT: No thrush or ulcers. Lymphatics: No palpable cervical, supraclavicular or left axillary lymph nodes. Resp: Lungs clear bilaterally. Cardio: Regular rate and rhythm. GI: Abdomen soft and nontender. No organomegaly. Vascular: No leg edema. Breast: Status post left mastectomy. No evidence for chest wall tumor recurrence.   Lab Results:  Lab Results  Component Value Date   WBC 5.0 02/04/2016   HGB 11.3 (L) 02/04/2016   HCT 35.1 02/04/2016   MCV 92.1 02/04/2016   PLT 146 02/04/2016   NEUTROABS 3.5 02/04/2016    Imaging:  Nm Pet Image Restag (ps) Skull Base To Thigh  Result Date: 02/04/2016 CLINICAL DATA:  Subsequent treatment strategy for metastatic breast cancer to skeleton. EXAM: NUCLEAR MEDICINE PET SKULL BASE TO THIGH TECHNIQUE: 10.8 mCi F-18 FDG was injected intravenously. Full-ring PET imaging was performed from the skull base to thigh after the radiotracer. CT data was obtained and used for attenuation correction and anatomic localization. FASTING BLOOD GLUCOSE:  Value: 90 mg/dl COMPARISON:  08/12/2015 FINDINGS: NECK No hypermetabolic lymph nodes in the neck. Chronic bilateral maxillary sinusitis. Periventricular white matter hypodensity compatible with chronic ischemic microvascular white matter disease. High activity along neck musculature likely physiologic. Similarly, high glottic activity bilaterally is likely physiologic. CHEST  Hypermetabolic activity in the left pectoralis, right shoulder girdle, and bilateral paraspinal musculature is felt to be physiologic and has no CT correlate. No hypermetabolic pulmonary nodules are identified. Aortic and coronary atherosclerotic vascular disease. ABDOMEN/PELVIS New hypermetabolic scattered liver lesions are present a dominant lesion measuring approximately 2.8 cm in the lateral segment left hepatic lobe has maximum standard uptake value 38.7. Physiologic activity in pelvic and paraspinal musculature. Prior cholecystectomy. Suspected calcified subserosal fibroid along the anterior uterine margin. SKELETON There are a variety of new skeletal metastatic lesions scattered in the chest, abdomen, and pelvis, including multiple thoracic and lumbar vertebra, multiple new areas of rib involvement, considerable no involvement in the sacrum, progressive involvement in the right iliac crest and anterior superior iliac spine, new involvement of the left upper and right posterior acetabulum, new involvement of the right inferior pubic ramus, and new involvement of the right greater trochanter. A new index lesion in the S1 level of the sacrum has a maximum standard uptake value of 32.9. The hip lesion does not appear in danger of causing an incipient fracture. There are multiple sclerotic lesions scattered in the skeleton which are not hypermetabolic currently and which are likely compatible with prior treated metastatic lesions. IMPRESSION: 1. Interval worsening metastatic disease, with about 6 new hypermetabolic metastatic lesions to the liver and numerous new skeletal metastatic lesions. 2. 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 Aortic and coronary atherosclerosis. Electronically Signed   By: Van Clines M.D.   On: 02/04/2016 13:22    Medications: I have reviewed the patient's  current medications.  Assessment/Plan: 1. Stage IIB synchronous primary left-sided breast  cancers, both T2 lesions, 3 positive lymph nodes, status post left mastectomy and axillary lymph node dissection April 29, 2008. ER positive, PR positive, HER-2 negative She completed 4 cycles of adjuvant AC chemotherapy June 15 through September 03, 2008, and then completed the left chest wall radiation. She began Arimidex following an office visit on November 11, 2008.  Bone scan 06/01/2013 suggestive of thoracic spine metastases, thoracic MRI 06/27/2011 consistent with multiple bone metastases involving the thoracolumbar spine   PET scan 07/09/2013 with multiple hypermetabolic bone lesions and hypermetabolic mediastinal nodes.   Left iliac lesion biopsy 07/26/2013. Pathology showed metastatic carcinoma, ER positive, PR positive, HER-2 negative   Initiation of Faslodex 08/10/2013.  Xgeva every 3 months initiated 07/27/2013.  Restaging PET scan 01/23/2014 with no residual hypermetabolic mediastinal disease; marked improvement in the metastatic bone disease.  Restaging PET scan 02/17/2015 with recurrence of skeletal metastasis. New hypermetabolic lesions within the pelvis and ribs. No hypermetabolic lymphadenopathy.  Continuation of Faslodex; initiation of Ibrance 03/05/2015  Cycle 2 Ibrance 04/09/2015-dose reduced to 100 mg daily  Cycle 3 Ibrance 05/08/2015  Cycle 4 Ibrance 06/11/2015  Cycle 5 Ibrance 07/15/2015  PET scan 08/12/2015-increase in size and increased metabolic activity associated with bone metastases, no new lesions  Ibrance/Faslodex discontinued  Tamoxifen started 08/07/2015  PET scan 02/04/2016 with new hypermetabolic liver lesions and numerous new skeletal lesions. 2. Left chest subcutaneous mass noted on exam October 04, 2008, status post biopsy by Dr. Brantley Stage with a benign pathology. 3. History of delayed healing of the left mastectomy incision. 4. Rheumatoid  arthritis. 5. Diabetes. 6. Hypercholesterolemia. 7. Hypertension. 8. Family history of breast cancer. She has been evaluated at the genetic screening clinic. 9. 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 10. 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 The recent restaging PET scan shows evidence of disease progression with new liver and bone lesions. She will discontinue tamoxifen. Dr. Benay Spice recommends Xeloda on a 7 day on/7 day off schedule, dose reduced due to renal dysfunction. We reviewed potential toxicities associated with Xeloda including myelosuppression, nausea, mouth sores, diarrhea, skin hyperpigmentation, hand-foot syndrome, conjunctivitis. She is agreeable to proceed. We anticipate a start date of 02/11/2016. She will return for labs and a follow-up visit on 03/05/2016. She will contact the office in the interim with any problems.  Patient seen with Dr. Benay Spice. PET images reviewed on the computer with Ms. Smithhart and her husband. 25 minutes were spent face-to-face at today's visit with the majority of that time involved in counseling/coordination of care.    [name] ANP/GNP-BC   02/06/2016  10:57 AM  this was a shared visit with [name]. [name] has progressive metastatic breast cancer.tamoxifen will be discontinued. She will begin Xeloda. We reviewed the potential toxicities from Xeloda. She agrees to proceed.  [name], M.D.

## 2016-02-06 NOTE — Telephone Encounter (Signed)
Appointments scheduled per 02/06/16 los. Patient was given a copy of the AVS report and appointment schedule,per 02/06/16 los. °

## 2016-02-11 ENCOUNTER — Encounter: Payer: Self-pay | Admitting: Pharmacist

## 2016-02-11 MED FILL — XELODA 500 MG TABLET: 500 | 21 days supply | Qty: 84 | Fill #0

## 2016-02-11 NOTE — Progress Notes (Signed)
Oral Chemotherapy Pharmacist Encounter New prescription for Xeloda has been sent to the [facility] outpatient pharmacy for benefit analysis and approval.   Will follow up with patient regarding insurance and pharmacy.   Detected drug-drug interaction between Xeloda and Leflunomide.  "Risk category D; consider therapy modification.  Patients on leflunomide & another immunosuppressive medication should be monitored for bone marrow suppression at least monthly throughout the duration of concurrent therapy" - [employer] Drug Interactions; accessed 02/11/16.  Thank you, Kennith Center, Pharm.D., CPP 02/11/2016@3 :52 PM Oral Chemotherapy Clinic

## 2016-02-12 ENCOUNTER — Other Ambulatory Visit: Payer: Self-pay | Admitting: Rheumatology

## 2016-02-12 NOTE — Telephone Encounter (Signed)
Last Visit: 11/25/15 Next Visit: 02/25/16 Labs: 02/04/16 BUN: 29.3, Creat. 1.6, GFR 33  Okay to refill Arava?

## 2016-02-18 ENCOUNTER — Other Ambulatory Visit: Payer: Self-pay | Admitting: Oncology

## 2016-02-18 ENCOUNTER — Telehealth: Payer: Self-pay

## 2016-02-18 DIAGNOSIS — C7951 Secondary malignant neoplasm of bone: Principal | ICD-10-CM

## 2016-02-18 DIAGNOSIS — C50919 Malignant neoplasm of unspecified site of unspecified female breast: Secondary | ICD-10-CM

## 2016-02-18 MED ORDER — OXYCODONE-ACETAMINOPHEN 10-325 MG PO TABS: 1.0000 | ORAL_TABLET | ORAL | 0 refills | Status: DC | PRN

## 2016-02-18 MED FILL — ALPRAZolam 0.5 MG TABS: 0.5 | 22 days supply | Qty: 90 | Fill #0

## 2016-02-18 NOTE — Telephone Encounter (Signed)
Pt called for percocet refill. Please call her only if there is a problem with her picking it up tomorrow.

## 2016-02-19 MED FILL — OXYCODONE-ACETAMINOPHEN 10-: 10-325 | 30 days supply | Qty: 180 | Fill #0

## 2016-02-25 ENCOUNTER — Encounter: Payer: Self-pay | Admitting: Rheumatology

## 2016-02-25 ENCOUNTER — Ambulatory Visit (INDEPENDENT_AMBULATORY_CARE_PROVIDER_SITE_OTHER): Payer: Medicare Other | Admitting: Rheumatology

## 2016-02-25 VITALS — BP 100/70 | Ht 63.0 in | Wt 196.0 lb

## 2016-02-25 DIAGNOSIS — M0579 Rheumatoid arthritis with rheumatoid factor of multiple sites without organ or systems involvement: Secondary | ICD-10-CM

## 2016-02-25 DIAGNOSIS — R5383 Other fatigue: Secondary | ICD-10-CM

## 2016-02-25 DIAGNOSIS — C50912 Malignant neoplasm of unspecified site of left female breast: Secondary | ICD-10-CM

## 2016-02-25 DIAGNOSIS — Z79899 Other long term (current) drug therapy: Secondary | ICD-10-CM | POA: Diagnosis not present

## 2016-02-25 NOTE — Progress Notes (Signed)
Office Visit Note  Patient: [name]             Date of Birth: [birth year]           MRN: [id number]             PCP: Tula Nakayama Referring: Aletha Halim., PA-C Visit Date: 02/25/2016 Occupation: '@GUAROCC' @    Subjective:  Follow-up Follow-up on rheumatoid arthritis, Arava and Plaquenil, left breast cancer with mastectomy and metastases to liver and bones  History of Present Illness: [name] is a 66 y.o. female  Last seen 11/25/2015  Patient reports that she is doing very well with Plaquenil and Arava Her rheumatoid arthritis is well controlled with the combination of these 2 medications.  On 02/04/2016, she had a PET scan. It showed metastasis to liver and bones. They started her on a new medication called Xeloda . This medication does have interaction with her immunosuppressive therapy.  Patient is unsure what to do regarding her immunosuppressive therapy. We discussed options of stopping her immunosuppressive therapy vs. monitoring her carefully. Patient elected to have her therapy monitored since she is responding so well to her current immunosuppressant therapy.  We have labs on file that were done before she started the Xeloda. We can use those labs and compare it to labs that she'll have done today. If these labs are comparable, we will just monitor her more carefully and keep her current therapy going. She is due to continue the Xeloda for at least 3 months total and she is already completed approximately 4 weeks. Patient is requested to call us tomorrow so we can check her labs for her. If we have those results, we can share them with the patient and inform her of our treatment strategy. If labs are noted and she'll call us Friday or Monday so we can keep patient informed of how we want to proceed.  Patient is otherwise doing very well and has no complaints.   Activities of Daily Living:  Patient reports morning stiffness for 15  minutes.   Patient Denies nocturnal pain.  Difficulty dressing/grooming: Denies Difficulty climbing stairs: Denies Difficulty getting out of chair: Denies Difficulty using hands for taps, buttons, cutlery, and/or writing: Denies   No Rheumatology ROS completed.   PMFS History:  Patient Active Problem List   Diagnosis Date Noted  . Seropositive rheumatoid arthritis of multiple sites ([place]) 11/22/2015  . 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 High risk medication use 11/22/2015  . Low kidney function 11/22/2015  . Breast cancer metastasized to bone ([place]) 07/27/2013  . History of breast cancer 02/04/2012  . Breast cancer ([place]) 01/04/2012    Past Medical History:  Diagnosis Date  . Arthritis   . Bone metastases ([place])    breast primary  . Breast cancer ([place])   . Cancer ([place])    left breast  . Diabetes mellitus   . History of radiation therapy 10/2008   left chest wall, left supraclavicular region, mastectomy scar  . Hyperlipidemia   . Hypertension     Family History  Problem Relation Age of Onset  . Diabetes Mother   . Hypertension Mother   . Heart disease Mother   . Diabetes Father   . Hypertension Father   . Heart disease Father   . Cancer Sister     breast   Past Surgical History:  Procedure Laterality Date  . BONE DENSITY TEST  2008  . BREAST SURGERY    .  CHOLECYSTECTOMY    . COLONOSCOPY  2007  . MASTECTOMY  2010   LEFT BREAST  . PORT-A-CATH REMOVAL    . PORTACATH PLACEMENT    . REPLACEMENT TOTAL KNEE  2008   RIGHT KNEE  . TOTAL HIP ARTHROPLASTY  2004   RIGHT HIP   Social History   Social History Narrative  . No narrative on file     Objective: Vital Signs: BP 100/70 (BP Location: Left Arm, Patient Position: Sitting, Cuff Size: Large)   Ht '5\' 3"'  (1.6 m)   Wt 196 lb (88.9 kg)   BMI 34.72 kg/m    Physical Exam   Musculoskeletal Exam:  Full range of motion of all joints Grip strength is equal and strong bilaterally Fiber myalgia tender  points are absent  CDAI Exam: CDAI Homunculus Exam:   Joint Counts:  CDAI Tender Joint count: 0 CDAI Swollen Joint count: 0  Global Assessments:  Patient Global Assessment: 0 Provider Global Assessment: 0  No synovitis on examination This is a very good improvement compared to her last visit where she had for swollen joints and 1 tender joint.   Investigation: No additional findings.  Hospital Outpatient Visit on 02/04/2016  Component Date Value Ref Range Status  . Glucose-Capillary 02/04/2016 90  65 - 99 mg/dL Final  Appointment on 02/04/2016  Component Date Value Ref Range Status  . WBC 02/04/2016 5.0  3.9 - 10.3 10e3/uL Final  . NEUT# 02/04/2016 3.5  1.5 - 6.5 10e3/uL Final  . HGB 02/04/2016 11.3* 11.6 - 15.9 g/dL Final  . HCT 02/04/2016 35.1  34.8 - 46.6 % Final  . Platelets 02/04/2016 146  145 - 400 10e3/uL Final  . MCV 02/04/2016 92.1  79.5 - 101.0 fL Final  . MCH 02/04/2016 29.7  25.1 - 34.0 pg Final  . MCHC 02/04/2016 32.2  31.5 - 36.0 g/dL Final  . RBC 02/04/2016 3.81  3.70 - 5.45 10e6/uL Final  . RDW 02/04/2016 14.3  11.2 - 14.5 % Final  . lymph# 02/04/2016 0.6* 0.9 - 3.3 10e3/uL Final  . MONO# 02/04/2016 0.8  0.1 - 0.9 10e3/uL Final  . Eosinophils Absolute 02/04/2016 0.1  0.0 - 0.5 10e3/uL Final  . Basophils Absolute 02/04/2016 0.0  0.0 - 0.1 10e3/uL Final  . NEUT% 02/04/2016 69.2  38.4 - 76.8 % Final  . LYMPH% 02/04/2016 12.8* 14.0 - 49.7 % Final  . MONO% 02/04/2016 15.6* 0.0 - 14.0 % Final  . EOS% 02/04/2016 2.4  0.0 - 7.0 % Final  . BASO% 02/04/2016 0.0  0.0 - 2.0 % Final  . Sodium 02/04/2016 138  136 - 145 mEq/L Final  . Potassium 02/04/2016 5.1  3.5 - 5.1 mEq/L Final  . Chloride 02/04/2016 107  98 - 109 mEq/L Final  . CO2 02/04/2016 21* 22 - 29 mEq/L Final  . Glucose 02/04/2016 79  70 - 140 mg/dl Final  . BUN 02/04/2016 29.3* 7.0 - 26.0 mg/dL Final  . Creatinine 02/04/2016 1.6* 0.6 - 1.1 mg/dL Final  . Total Bilirubin 02/04/2016 0.30  0.20 - 1.20  mg/dL Final  . Alkaline Phosphatase 02/04/2016 92  40 - 150 U/L Final  . AST 02/04/2016 20  5 - 34 U/L Final  . ALT 02/04/2016 17  0 - 55 U/L Final  . Total Protein 02/04/2016 7.0  6.4 - 8.3 g/dL Final  . Albumin 02/04/2016 3.5  3.5 - 5.0 g/dL Final  . Calcium 02/04/2016 10.2  8.4 - 10.4 mg/dL Final  . Anion  Gap 02/04/2016 10  3 - 11 mEq/L Final  . EGFR 02/04/2016 33* >90 ml/min/1.73 m2 Final  . CA 27.29 02/04/2016 53.2* 0.0 - 38.6 U/mL Final  Appointment on 12/23/2015  Component Date Value Ref Range Status  . WBC 12/23/2015 5.5  3.9 - 10.3 10e3/uL Final  . NEUT# 12/23/2015 4.4  1.5 - 6.5 10e3/uL Final  . HGB 12/23/2015 11.9  11.6 - 15.9 g/dL Final  . HCT 12/23/2015 37.2  34.8 - 46.6 % Final  . Platelets 12/23/2015 149  145 - 400 10e3/uL Final  . MCV 12/23/2015 92.7  79.5 - 101.0 fL Final  . MCH 12/23/2015 29.6  25.1 - 34.0 pg Final  . MCHC 12/23/2015 32.0  31.5 - 36.0 g/dL Final  . RBC 12/23/2015 4.01  3.70 - 5.45 10e6/uL Final  . RDW 12/23/2015 14.8* 11.2 - 14.5 % Final  . lymph# 12/23/2015 0.5* 0.9 - 3.3 10e3/uL Final  . MONO# 12/23/2015 0.5  0.1 - 0.9 10e3/uL Final  . Eosinophils Absolute 12/23/2015 0.1  0.0 - 0.5 10e3/uL Final  . Basophils Absolute 12/23/2015 0.0  0.0 - 0.1 10e3/uL Final  . NEUT% 12/23/2015 79.9* 38.4 - 76.8 % Final  . LYMPH% 12/23/2015 8.8* 14.0 - 49.7 % Final  . MONO% 12/23/2015 8.7  0.0 - 14.0 % Final  . EOS% 12/23/2015 2.4  0.0 - 7.0 % Final  . BASO% 12/23/2015 0.2  0.0 - 2.0 % Final  . Sodium 12/23/2015 140  136 - 145 mEq/L Final  . Potassium 12/23/2015 4.7  3.5 - 5.1 mEq/L Final  . Chloride 12/23/2015 109  98 - 109 mEq/L Final  . CO2 12/23/2015 20* 22 - 29 mEq/L Final  . Glucose 12/23/2015 130  70 - 140 mg/dl Final  . BUN 12/23/2015 33.5* 7.0 - 26.0 mg/dL Final  . Creatinine 12/23/2015 1.5* 0.6 - 1.1 mg/dL Final  . Total Bilirubin 12/23/2015 0.23  0.20 - 1.20 mg/dL Final  . Alkaline Phosphatase 12/23/2015 110  40 - 150 U/L Final  . AST 12/23/2015 26   5 - 34 U/L Final  . ALT 12/23/2015 24  0 - 55 U/L Final  . Total Protein 12/23/2015 7.0  6.4 - 8.3 g/dL Final  . Albumin 12/23/2015 3.3* 3.5 - 5.0 g/dL Final  . Calcium 12/23/2015 8.4  8.4 - 10.4 mg/dL Final  . Anion Gap 12/23/2015 11  3 - 11 mEq/L Final  . EGFR 12/23/2015 38* >90 ml/min/1.73 m2 Final  . CA 27.29 12/23/2015 41.7* 0.0 - 38.6 U/mL Final  Appointment on 11/25/2015  Component Date Value Ref Range Status  . Sodium 11/25/2015 140  136 - 145 mEq/L Final  . Potassium 11/25/2015 5.1  3.5 - 5.1 mEq/L Final  . Chloride 11/25/2015 112* 98 - 109 mEq/L Final  . CO2 11/25/2015 19* 22 - 29 mEq/L Final  . Glucose 11/25/2015 110  70 - 140 mg/dl Final  . BUN 11/25/2015 34.6* 7.0 - 26.0 mg/dL Final  . Creatinine 11/25/2015 1.4* 0.6 - 1.1 mg/dL Final  . Total Bilirubin 11/25/2015 0.26  0.20 - 1.20 mg/dL Final  . Alkaline Phosphatase 11/25/2015 106  40 - 150 U/L Final  . AST 11/25/2015 23  5 - 34 U/L Final  . ALT 11/25/2015 22  0 - 55 U/L Final  . Total Protein 11/25/2015 7.2  6.4 - 8.3 g/dL Final  . Albumin 11/25/2015 3.4* 3.5 - 5.0 g/dL Final  . Calcium 11/25/2015 8.5  8.4 - 10.4 mg/dL Final  . Anion Gap  11/25/2015 9  3 - 11 mEq/L Final  . EGFR 11/25/2015 39* >90 ml/min/1.73 m2 Final  . CA 27.29 11/25/2015 46.9* 0.0 - 38.6 U/mL Final  Appointment on 10/27/2015  Component Date Value Ref Range Status  . WBC 10/27/2015 5.5  3.9 - 10.3 10e3/uL Final  . NEUT# 10/27/2015 4.2  1.5 - 6.5 10e3/uL Final  . HGB 10/27/2015 11.9  11.6 - 15.9 g/dL Final  . HCT 10/27/2015 36.8  34.8 - 46.6 % Final  . Platelets 10/27/2015 170  145 - 400 10e3/uL Final  . MCV 10/27/2015 95.2  79.5 - 101.0 fL Final  . MCH 10/27/2015 30.7  25.1 - 34.0 pg Final  . MCHC 10/27/2015 32.3  31.5 - 36.0 g/dL Final  . RBC 10/27/2015 3.87  3.70 - 5.45 10e6/uL Final  . RDW 10/27/2015 14.2  11.2 - 14.5 % Final  . lymph# 10/27/2015 0.6* 0.9 - 3.3 10e3/uL Final  . MONO# 10/27/2015 0.5  0.1 - 0.9 10e3/uL Final  . Eosinophils  Absolute 10/27/2015 0.2  0.0 - 0.5 10e3/uL Final  . Basophils Absolute 10/27/2015 0.0  0.0 - 0.1 10e3/uL Final  . NEUT% 10/27/2015 76.4  38.4 - 76.8 % Final  . LYMPH% 10/27/2015 10.6* 14.0 - 49.7 % Final  . MONO% 10/27/2015 9.2  0.0 - 14.0 % Final  . EOS% 10/27/2015 3.6  0.0 - 7.0 % Final  . BASO% 10/27/2015 0.2  0.0 - 2.0 % Final  . Sodium 10/27/2015 141  136 - 145 mEq/L Final  . Potassium 10/27/2015 4.9  3.5 - 5.1 mEq/L Final  . Chloride 10/27/2015 109  98 - 109 mEq/L Final  . CO2 10/27/2015 23  22 - 29 mEq/L Final  . Glucose 10/27/2015 78  70 - 140 mg/dl Final  . BUN 10/27/2015 37.7* 7.0 - 26.0 mg/dL Final  . Creatinine 10/27/2015 1.5* 0.6 - 1.1 mg/dL Final  . Total Bilirubin 10/27/2015 <0.30  0.20 - 1.20 mg/dL Final  . Alkaline Phosphatase 10/27/2015 93  40 - 150 U/L Final  . AST 10/27/2015 20  5 - 34 U/L Final  . ALT 10/27/2015 14  0 - 55 U/L Final  . Total Protein 10/27/2015 7.1  6.4 - 8.3 g/dL Final  . Albumin 10/27/2015 3.4* 3.5 - 5.0 g/dL Final  . Calcium 10/27/2015 9.0  8.4 - 10.4 mg/dL Final  . Anion Gap 10/27/2015 9  3 - 11 mEq/L Final  . EGFR 10/27/2015 37* >90 ml/min/1.73 m2 Final  . CA 27.29 10/27/2015 42.7* 0.0 - 38.6 U/mL Final  Appointment on 09/15/2015  Component Date Value Ref Range Status  . WBC 09/15/2015 7.6  3.9 - 10.3 10e3/uL Final  . NEUT# 09/15/2015 6.1  1.5 - 6.5 10e3/uL Final  . HGB 09/15/2015 11.9  11.6 - 15.9 g/dL Final  . HCT 09/15/2015 36.6  34.8 - 46.6 % Final  . Platelets 09/15/2015 202  145 - 400 10e3/uL Final  . MCV 09/15/2015 99.5  79.5 - 101.0 fL Final  . MCH 09/15/2015 32.5  25.1 - 34.0 pg Final  . MCHC 09/15/2015 32.7  31.5 - 36.0 g/dL Final  . RBC 09/15/2015 3.67* 3.70 - 5.45 10e6/uL Final  . RDW 09/15/2015 14.3  11.2 - 14.5 % Final  . lymph# 09/15/2015 0.6* 0.9 - 3.3 10e3/uL Final  . MONO# 09/15/2015 0.7  0.1 - 0.9 10e3/uL Final  . Eosinophils Absolute 09/15/2015 0.2  0.0 - 0.5 10e3/uL Final  . Basophils Absolute 09/15/2015 0.0  0.0 -  0.1 10e3/uL  Final  . NEUT% 09/15/2015 80.1* 38.4 - 76.8 % Final  . LYMPH% 09/15/2015 7.6* 14.0 - 49.7 % Final  . MONO% 09/15/2015 9.4  0.0 - 14.0 % Final  . EOS% 09/15/2015 2.8  0.0 - 7.0 % Final  . BASO% 09/15/2015 0.1  0.0 - 2.0 % Final  . Sodium 09/15/2015 142  136 - 145 mEq/L Final  . Potassium 09/15/2015 4.6  3.5 - 5.1 mEq/L Final  . Chloride 09/15/2015 107  98 - 109 mEq/L Final  . CO2 09/15/2015 24  22 - 29 mEq/L Final  . Glucose 09/15/2015 97  70 - 140 mg/dl Final  . BUN 09/15/2015 28.4* 7.0 - 26.0 mg/dL Final  . Creatinine 09/15/2015 1.3* 0.6 - 1.1 mg/dL Final  . Total Bilirubin 09/15/2015 0.35  0.20 - 1.20 mg/dL Final  . Alkaline Phosphatase 09/15/2015 102  40 - 150 U/L Final  . AST 09/15/2015 18  5 - 34 U/L Final  . ALT 09/15/2015 13  0 - 55 U/L Final  . Total Protein 09/15/2015 7.4  6.4 - 8.3 g/dL Final  . Albumin 09/15/2015 3.6  3.5 - 5.0 g/dL Final  . Calcium 09/15/2015 9.4  8.4 - 10.4 mg/dL Final  . Anion Gap 09/15/2015 11  3 - 11 mEq/L Final  . EGFR 09/15/2015 43* >90 ml/min/1.73 m2 Final     Imaging: Nm Pet Image Restag (ps) Skull Base To Thigh  Result Date: 02/04/2016 CLINICAL DATA:  Subsequent treatment strategy for metastatic breast cancer to skeleton. EXAM: NUCLEAR MEDICINE PET SKULL BASE TO THIGH TECHNIQUE: 10.8 mCi F-18 FDG was injected intravenously. Full-ring PET imaging was performed from the skull base to thigh after the radiotracer. CT data was obtained and used for attenuation correction and anatomic localization. FASTING BLOOD GLUCOSE:  Value: 90 mg/dl COMPARISON:  08/12/2015 FINDINGS: NECK No hypermetabolic lymph nodes in the neck. Chronic bilateral maxillary sinusitis. Periventricular white matter hypodensity compatible with chronic ischemic microvascular white matter disease. High activity along neck musculature likely physiologic. Similarly, high glottic activity bilaterally is likely physiologic. CHEST Hypermetabolic activity in the left pectoralis, right  shoulder girdle, and bilateral paraspinal musculature is felt to be physiologic and has no CT correlate. No hypermetabolic pulmonary nodules are identified. Aortic and coronary atherosclerotic vascular disease. ABDOMEN/PELVIS New hypermetabolic scattered liver lesions are present a dominant lesion measuring approximately 2.8 cm in the lateral segment left hepatic lobe has maximum standard uptake value 38.7. Physiologic activity in pelvic and paraspinal musculature. Prior cholecystectomy. Suspected calcified subserosal fibroid along the anterior uterine margin. SKELETON There are a variety of new skeletal metastatic lesions scattered in the chest, abdomen, and pelvis, including multiple thoracic and lumbar vertebra, multiple new areas of rib involvement, considerable no involvement in the sacrum, progressive involvement in the right iliac crest and anterior superior iliac spine, new involvement of the left upper and right posterior acetabulum, new involvement of the right inferior pubic ramus, and new involvement of the right greater trochanter. A new index lesion in the S1 level of the sacrum has a maximum standard uptake value of 32.9. The hip lesion does not appear in danger of causing an incipient fracture. There are multiple sclerotic lesions scattered in the skeleton which are not hypermetabolic currently and which are likely compatible with prior treated metastatic lesions. IMPRESSION: 1. Interval worsening metastatic disease, with about 6 new hypermetabolic metastatic lesions to the liver and numerous new skeletal metastatic lesions. 2. Other imaging findings of potential clinical significance: Chronic bilateral maxillary sinusitis. 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 Aortic and coronary atherosclerosis. Electronically Signed   By: Van Clines M.D.   On: 02/04/2016 13:22    Speciality Comments: No specialty comments available.    Procedures:  No procedures  performed Allergies: Doxycycline   Assessment / Plan:     Visit Diagnoses: Seropositive rheumatoid arthritis of multiple sites ([place]) - Plan: CMP14+EGFR, CBC with Differential/Platelet, VITAMIN D 25 Hydroxy (Vit-D Deficiency, Fractures), CMP14+EGFR, CBC with Differential/Platelet, VITAMIN D 25 Hydroxy (Vit-D Deficiency, Fractures)  High risk medications (not anticoagulants) long-term use - arava 31m qd; plq 2052mqd; - Plan: CMP14+EGFR, CBC with Differential/Platelet, VITAMIN D 25 Hydroxy (Vit-D Deficiency, Fractures), CMP14+EGFR, CBC with Differential/Platelet, VITAMIN D 25 Hydroxy (Vit-D Deficiency, Fractures)  Malignant neoplasm of left female breast, unspecified estrogen receptor status, [place] site of breast [place] w/ total resection with lymph nodes in 2010; february 2018 ==> found mets to liver ==> see PET scan. - Plan: CMP14+EGFR, CBC with Differential/Platelet, VITAMIN D 25 Hydroxy (Vit-D Deficiency, Fractures), CMP14+EGFR, CBC with Differential/Platelet, VITAMIN D 25 Hydroxy (Vit-D Deficiency, Fractures)  Fatigue, unspecified type - Plan: VITAMIN D 25 Hydroxy (Vit-D Deficiency, Fractures), VITAMIN D 25 Hydroxy (Vit-D Deficiency, Fractures)   Plan ==> FROM ==.PET SCAN from Feb 04, 2016 ==>  IMPRESSION: 1. Interval worsening metastatic disease, with about 6 new hypermetabolic metastatic lesions to the liver and numerous new skeletal metastatic lesions.   Patient has been currently prescribed medication caused the low do for her metastatic disease. There is interaction between this medication (Xeloda 500 mg) and the leflunomide that were providing for the patient. There is hematologic interaction. Patient has been on this medication since mid January. We have lab work for the patient from prior to starting this medication. We will do lab work today and compare the 2 different labs to see if indeed there is any hematological interaction affecting the patient's blood work  with the new medication and leflunomide. Once I get the results back, I will inform the patient on continuing the leflunomide or stopping it.  Currently the patient is on leflunomide 10 mg daily. She is also on Plaquenil 200 mg daily. Currently these 2 medications have continued to help the patient's rheumatoid arthritis being stable. She does not have any synovitis. Any joint stiffness that she has is more from osteoarthritis.  Return to clinic in 2 months   Orders: Orders Placed This Encounter  Procedures  . CMP14+EGFR  . CBC with Differential/Platelet  . VITAMIN D 25 Hydroxy (Vit-D Deficiency, Fractures)   No orders of the defined types were placed in this encounter.   Face-to-face time spent with patient was 4079mtes. 50% of time was spent in counseling and coordination of care.  Follow-Up Instructions: Return in about 2 months (around 04/24/2016) for RA,[id number];left breast mastectomy ->mets to liver & skeletal.   NaitEliezer Lofts-C  Note - This record has been created using [employer] creation errors have been sought, but may not always  have been located. Such creation errors do not reflect on  the standard of medical care.

## 2016-02-25 NOTE — Progress Notes (Signed)
Rheumatology Pharmacist Note [name] is a pleasant 66 yo F who presents for follow up of her rheumatoid arthritis.  She is currently taking hydroxychloroquine 200 mg daily, leflunomide 10 mg daily, and prednisone 1 mg daily.  Patient has history of breast cancer.  Therapy was recently changed from tamoxifen to Xeloda.  Patient takes Xeloda 7 days on, 7 days off.  She started the medication on 02/14/16 and took through 02/21/16.  She is not currently taking Xeloda this week.  She reports she will start again on 02/28/16.  Noted that possible interaction exists between leflunomide and Xeloda.  "Immunosuppressants may enhance the adverse/toxic effect of Leflunomide. Specifically, the risk for hematologic toxicity such as pancytopenia, agranulocytosis, and/or thrombocytopenia may be increased.  Patients receiving both leflunomide and another immunosuppressive medication should be monitored for bone marrow suppression at least monthly throughout the duration of concurrent therapy."  Patient's most recent standing labs were on 02/04/16.  Will recommend close monitoring of CBC (at least monthly) if leflunomide is continued.  Patient's most recent hydroxychloroquine eye exam was in December 2016.  Patient reports she had an eye exam scheduled for December 2017, but the provider rescheduled her appointment for April 2018.  Will provide patient with hydroxychloroquine eye exam form.    Elisabeth Most, Pharm.D., BCPS, CPP Clinical Pharmacist Pager: [phone] Phone: [phone] 02/25/2016 11:03 AM

## 2016-02-26 ENCOUNTER — Telehealth: Payer: Self-pay | Admitting: *Deleted

## 2016-02-26 ENCOUNTER — Other Ambulatory Visit: Payer: Self-pay | Admitting: *Deleted

## 2016-02-26 ENCOUNTER — Encounter: Payer: Self-pay | Admitting: Rheumatology

## 2016-02-26 DIAGNOSIS — Z9189 Other specified personal risk factors, not elsewhere classified: Secondary | ICD-10-CM

## 2016-02-26 DIAGNOSIS — Z79899 Other long term (current) drug therapy: Secondary | ICD-10-CM

## 2016-02-26 LAB — CBC WITH DIFFERENTIAL/PLATELET
Basophils Absolute: 0 10*3/uL (ref 0.0–0.2)
Basos: 0 %
EOS (ABSOLUTE): 0.1 10*3/uL (ref 0.0–0.4)
Eos: 3 %
Hematocrit: 33.8 % — ABNORMAL LOW (ref 34.0–46.6)
Hemoglobin: 10.8 g/dL — ABNORMAL LOW (ref 11.1–15.9)
Immature Grans (Abs): 0 10*3/uL (ref 0.0–0.1)
Immature Granulocytes: 0 %
Lymphocytes Absolute: 0.6 10*3/uL — ABNORMAL LOW (ref 0.7–3.1)
Lymphs: 12 %
MCH: 29.1 pg (ref 26.6–33.0)
MCHC: 32 g/dL (ref 31.5–35.7)
MCV: 91 fL (ref 79–97)
Monocytes Absolute: 0.4 10*3/uL (ref 0.1–0.9)
Monocytes: 8 %
Neutrophils Absolute: 3.7 10*3/uL (ref 1.4–7.0)
Neutrophils: 77 %
Platelets: 148 10*3/uL — ABNORMAL LOW (ref 150–379)
RBC: 3.71 x10E6/uL — ABNORMAL LOW (ref 3.77–5.28)
RDW: 15.1 % (ref 12.3–15.4)
WBC: 4.8 10*3/uL (ref 3.4–10.8)

## 2016-02-26 LAB — CMP14+EGFR
ALBUMIN: 4 g/dL (ref 3.6–4.8)
ALK PHOS: 91 IU/L (ref 39–117)
ALT: 15 IU/L (ref 0–32)
AST: 17 IU/L (ref 0–40)
Albumin/Globulin Ratio: 1.5 (ref 1.2–2.2)
BUN/Creatinine Ratio: 20 (ref 12–28)
BUN: 31 mg/dL — AB (ref 8–27)
Bilirubin Total: 0.2 mg/dL (ref 0.0–1.2)
CALCIUM: 8.5 mg/dL — AB (ref 8.7–10.3)
CO2: 17 mmol/L — ABNORMAL LOW (ref 18–29)
CREATININE: 1.52 mg/dL — AB (ref 0.57–1.00)
Chloride: 106 mmol/L (ref 96–106)
GFR calc Af Amer: 41 mL/min/{1.73_m2} — ABNORMAL LOW (ref >59)
GFR, EST NON AFRICAN AMERICAN: 36 mL/min/{1.73_m2} — AB (ref >59)
GLOBULIN, TOTAL: 2.7 g/dL (ref 1.5–4.5)
GLUCOSE: 102 mg/dL — AB (ref 65–99)
Potassium: 4.5 mmol/L (ref 3.5–5.2)
SODIUM: 141 mmol/L (ref 134–144)
Total Protein: 6.7 g/dL (ref 6.0–8.5)

## 2016-02-26 LAB — VITAMIN D 25 HYDROXY (VIT D DEFICIENCY, FRACTURES): Vit D, 25-Hydroxy: 28.1 ng/mL — ABNORMAL LOW (ref 30.0–100.0)

## 2016-02-26 MED ORDER — VITAMIN D (ERGOCALCIFEROL) 1.25 MG (50000 UNIT) PO CAPS: 50000.0000 [IU] | ORAL_CAPSULE | ORAL | 0 refills | Status: DC

## 2016-02-26 MED FILL — VIT D2 1.25 MG (50,000 UNIT: 1.25 MG | 84 days supply | Qty: 12 | Fill #0

## 2016-02-26 NOTE — Telephone Encounter (Signed)
-----   Message from [place], [place] sent at 02/26/2016  1:19 PM EST ----- Please send copy of this lab to patient's PCP and I have already called patient regarding the following:  #1: I already told the patient her CMP with GFR is within normal limits (we'll monitor again in 2 months)  #2: CBC with differential is within normal limits except for mild anemia. I told the patient that her hemoglobin and hematocrit were slightly lower than 3 weeks ago. I am concerned that this could be due to the interaction of Xeloda and leflunomide. As a result I told the patient to take leflunomide every other day while she is on Xeloda. Patient understands and is agreeable. She is already aware to get repeat lab work on a monthly basis for CBC with differential in March and then again in April. In April she'll get CMP with GFR also. The labs of 40 been placed in the system. She goes to [facility] for these labs.  #3: Vitamin D is low. : Prescription for vitamin D 3, 50,000 units once a week, dispense 12 pills with no refill We will recheck vitamin D in 3 months at [facility]. Please place order.

## 2016-02-26 NOTE — Progress Notes (Signed)
   Patient has history of breast cancer, she is on a new medication Xeloda. There is interaction potential with ARAVA and Xeloda.  We need to monitor patient's CBC on a monthly basis due to this interaction.  We have baseline CBC from January 2018, We have updated CBC after being on the medicine for a few weeks from February 2018 She'll be due for repeat CBC in March and April 2018  Due to the interaction, I've asked her to take leflunomide every other day while she is on XELODA.

## 2016-03-01 ENCOUNTER — Telehealth: Payer: Self-pay | Admitting: Oncology

## 2016-03-01 ENCOUNTER — Encounter: Payer: Self-pay | Admitting: Oncology

## 2016-03-01 NOTE — Progress Notes (Signed)
Rcvd letter from [facility] regarding pt's Alprazolam no longer being part of her plans formulary. I contacted the pt to see if she received the letter which she has and she will switch to a medication that is part of her plans formulary.  She will discuss this with Dr. Benay Spice at her next visit.

## 2016-03-01 NOTE — Telephone Encounter (Signed)
Appointments scheduled per 2/6 LOS, GBS. Patient notified of change in schedule.

## 2016-03-01 NOTE — Progress Notes (Signed)
Oral Chemotherapy Pharmacist Encounter  Received notification from [facility] that patient's Xeloda prescription carries a $0 copay Patient has picked up Xeloda on 02/12/16  Johny Drilling, PharmD, BCPS, BCOP 03/01/2016  4:32 PM [facility] [phone]

## 2016-03-05 ENCOUNTER — Ambulatory Visit: Payer: Medicare Other | Admitting: Oncology

## 2016-03-05 ENCOUNTER — Other Ambulatory Visit: Payer: Medicare Other

## 2016-03-10 ENCOUNTER — Ambulatory Visit (HOSPITAL_BASED_OUTPATIENT_CLINIC_OR_DEPARTMENT_OTHER): Payer: Medicare Other | Admitting: Oncology

## 2016-03-10 ENCOUNTER — Other Ambulatory Visit (HOSPITAL_BASED_OUTPATIENT_CLINIC_OR_DEPARTMENT_OTHER): Payer: Medicare Other

## 2016-03-10 ENCOUNTER — Telehealth: Payer: Self-pay | Admitting: Oncology

## 2016-03-10 VITALS — BP 110/47 | HR 90 | Temp 98.1°F | Resp 18 | Ht 63.0 in | Wt 214.0 lb

## 2016-03-10 DIAGNOSIS — G893 Neoplasm related pain (acute) (chronic): Secondary | ICD-10-CM | POA: Diagnosis not present

## 2016-03-10 DIAGNOSIS — D638 Anemia in other chronic diseases classified elsewhere: Secondary | ICD-10-CM

## 2016-03-10 DIAGNOSIS — E119 Type 2 diabetes mellitus without complications: Secondary | ICD-10-CM | POA: Diagnosis not present

## 2016-03-10 DIAGNOSIS — C7951 Secondary malignant neoplasm of bone: Secondary | ICD-10-CM

## 2016-03-10 DIAGNOSIS — N289 Disorder of kidney and ureter, unspecified: Secondary | ICD-10-CM

## 2016-03-10 DIAGNOSIS — C50919 Malignant neoplasm of unspecified site of unspecified female breast: Secondary | ICD-10-CM

## 2016-03-10 DIAGNOSIS — C50912 Malignant neoplasm of unspecified site of left female breast: Secondary | ICD-10-CM

## 2016-03-10 DIAGNOSIS — M069 Rheumatoid arthritis, unspecified: Secondary | ICD-10-CM

## 2016-03-10 DIAGNOSIS — D649 Anemia, unspecified: Secondary | ICD-10-CM | POA: Diagnosis not present

## 2016-03-10 DIAGNOSIS — R110 Nausea: Secondary | ICD-10-CM

## 2016-03-10 DIAGNOSIS — D630 Anemia in neoplastic disease: Secondary | ICD-10-CM

## 2016-03-10 DIAGNOSIS — I10 Essential (primary) hypertension: Secondary | ICD-10-CM

## 2016-03-10 DIAGNOSIS — D6481 Anemia due to antineoplastic chemotherapy: Secondary | ICD-10-CM | POA: Diagnosis not present

## 2016-03-10 LAB — CBC WITH DIFFERENTIAL/PLATELET
BASO%: 0 % (ref 0.0–2.0)
Basophils Absolute: 0 10*3/uL (ref 0.0–0.1)
EOS%: 4.6 % (ref 0.0–7.0)
Eosinophils Absolute: 0.2 10*3/uL (ref 0.0–0.5)
HCT: 30.1 % — ABNORMAL LOW (ref 34.8–46.6)
HGB: 9.5 g/dL — ABNORMAL LOW (ref 11.6–15.9)
LYMPH%: 17.4 % (ref 14.0–49.7)
MCH: 29.9 pg (ref 25.1–34.0)
MCHC: 31.6 g/dL (ref 31.5–36.0)
MCV: 94.7 fL (ref 79.5–101.0)
MONO#: 0.5 10*3/uL (ref 0.1–0.9)
MONO%: 13.8 % (ref 0.0–14.0)
NEUT%: 64.2 % (ref 38.4–76.8)
NEUTROS ABS: 2.1 10*3/uL (ref 1.5–6.5)
Platelets: 119 10*3/uL — ABNORMAL LOW (ref 145–400)
RBC: 3.18 10*6/uL — AB (ref 3.70–5.45)
RDW: 14.9 % — ABNORMAL HIGH (ref 11.2–14.5)
WBC: 3.3 10*3/uL — AB (ref 3.9–10.3)
lymph#: 0.6 10*3/uL — ABNORMAL LOW (ref 0.9–3.3)

## 2016-03-10 LAB — COMPREHENSIVE METABOLIC PANEL
ALT: 13 U/L (ref 0–55)
AST: 17 U/L (ref 5–34)
Albumin: 3.3 g/dL — ABNORMAL LOW (ref 3.5–5.0)
Alkaline Phosphatase: 83 U/L (ref 40–150)
Anion Gap: 7 mEq/L (ref 3–11)
BUN: 27.7 mg/dL — ABNORMAL HIGH (ref 7.0–26.0)
CHLORIDE: 108 meq/L (ref 98–109)
CO2: 24 meq/L (ref 22–29)
CREATININE: 1.5 mg/dL — AB (ref 0.6–1.1)
Calcium: 8.7 mg/dL (ref 8.4–10.4)
EGFR: 36 mL/min/{1.73_m2} — ABNORMAL LOW (ref >90)
GLUCOSE: 111 mg/dL (ref 70–140)
Potassium: 4.4 mEq/L (ref 3.5–5.1)
SODIUM: 139 meq/L (ref 136–145)
TOTAL PROTEIN: 6.1 g/dL — AB (ref 6.4–8.3)
Total Bilirubin: 0.3 mg/dL (ref 0.20–1.20)

## 2016-03-10 MED ORDER — PROCHLORPERAZINE MALEATE 10 MG PO TABS: 10.0000 mg | ORAL_TABLET | Freq: Four times a day (QID) | ORAL | 1 refills | Status: DC | PRN

## 2016-03-10 MED ORDER — XELODA 500 MG PO TABS: 1500.0000 mg | ORAL_TABLET | Freq: Two times a day (BID) | ORAL | 0 refills | Status: DC

## 2016-03-10 MED FILL — XELODA 500 MG TABLET: 500 | 28 days supply | Qty: 84 | Fill #0

## 2016-03-10 MED FILL — PROCHLORPERAZINE 10 MG TAB: 10 | 15 days supply | Qty: 60 | Fill #0

## 2016-03-10 NOTE — Telephone Encounter (Signed)
Gave patient avs report and appointments for March  °

## 2016-03-10 NOTE — Progress Notes (Signed)
[facility] OFFICE PROGRESS NOTE   Diagnosis: Breast cancer  INTERVAL HISTORY:   [name] returns as scheduled. She has completed 2 weeks of Xeloda therapy. No mouth sores. She had one episode of diarrhea during the off week from Xeloda. She had a "blister "at the left hand resolved after she treated it with "magic mouthwash ". She continues to have back and hip pain. She takes oxycodone as needed. She complains of malaise, but she is able to exercise 2 days per week. She has intermittent nausea and "dry heaves ". Compazine helps the nausea.  Objective:  Vital signs in last 24 hours:  Blood pressure (!) 110/47, pulse 90, temperature 98.1 F (36.7 C), temperature source Oral, resp. rate 18, height '5\' 3"'  (1.6 m), weight 214 lb (97.1 kg), SpO2 100 %.    HEENT: No thrush or ulcers Resp: Lungs clear bilaterally Cardio: Regular rate and rhythm GI: No hepatosplenomegaly, nontender Vascular: No leg edema  Skin: Palms without erythema    Lab Results:  Lab Results  Component Value Date   WBC 3.3 (L) 03/10/2016   HGB 9.5 (L) 03/10/2016   HCT 30.1 (L) 03/10/2016   MCV 94.7 03/10/2016   PLT 119 (L) 03/10/2016   NEUTROABS 2.1 03/10/2016     Medications: I have reviewed the patient's current medications.  Assessment/Plan: 1. 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 ER positive, PR positive, HER-2 negative She completed 4 cycles of adjuvant AC chemotherapy June 15 through September 03, 2008, and then completed the left chest wall radiation. She began Arimidex following an office visit on November 11, 2008.  Bone scan 06/01/2013 suggestive of thoracic spine metastases, thoracic MRI 06/27/2011 consistent with multiple bone metastases involving the thoracolumbar spine   PET scan 07/09/2013 with multiple hypermetabolic bone lesions and  hypermetabolic mediastinal nodes.   Left iliac lesion biopsy 07/26/2013. Pathology showed metastatic carcinoma, ER positive, PR positive, HER-2 negative   Initiation of Faslodex 08/10/2013.  Xgeva every 3 months initiated 07/27/2013.  Restaging PET scan 01/23/2014 with no residual hypermetabolic mediastinal disease; marked improvement in the metastatic bone disease.  Restaging PET scan 02/17/2015 with recurrence of skeletal metastasis. New hypermetabolic lesions within the pelvis and ribs. No hypermetabolic lymphadenopathy.  Continuation of Faslodex; initiation of Ibrance 03/05/2015  Cycle 2 Ibrance 04/09/2015-dose reduced to 100 mg daily  Cycle 3 Ibrance 05/08/2015  Cycle 4 Ibrance 06/11/2015  Cycle 5 Ibrance 07/15/2015  PET scan 08/12/2015-increase in size and increased metabolic activity associated with bone metastases, no new lesions  Ibrance/Faslodex discontinued  Tamoxifen started 08/07/2015  PET scan 02/04/2016 with new hypermetabolic liver lesions and numerous new skeletal lesions.  Xeloda, 7 days on/7 days off initiated February 2018 2. Left chest subcutaneous mass noted on exam October 04, 2008, status post biopsy by Dr. Brantley Stage with a benign pathology. 3. History of delayed healing of the left mastectomy incision. 4. Rheumatoid arthritis. 5. Diabetes. 6. Hypercholesterolemia. 7. Hypertension. 8. Family history of breast cancer. She has been evaluated at the genetic screening clinic. 9. 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 10. 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 Anemia secondary to metastatic breast cancer, chronic disease, renal insufficiency, and chemotherapy    Disposition:  Her overall status appears unchanged. She  appears to be tolerating the Xeloda  well. It is unusual to have significant nausea with Xeloda. She will continue Compazine as needed. She will contact us if Compazine does not relieve the nausea. She will complete another 2 weeks of Xeloda prior to a return office visit in one month. We will check a CA 27-29 when she returns next month.  She will contact us for increased nausea or symptoms of anemia.  She will resume xgeva therapy when she returns next month.  25 minutes were spent with the patient today. The majority of the time was used for counseling and coordination of care.  Betsy Coder, MD  03/10/2016  1:20 PM

## 2016-03-11 LAB — CANCER ANTIGEN 27.29: CA 27.29: 34.3 U/mL (ref 0.0–38.6)

## 2016-03-17 ENCOUNTER — Telehealth: Payer: Self-pay

## 2016-03-17 ENCOUNTER — Telehealth: Payer: Self-pay | Admitting: *Deleted

## 2016-03-17 ENCOUNTER — Other Ambulatory Visit: Payer: Self-pay

## 2016-03-17 DIAGNOSIS — C7951 Secondary malignant neoplasm of bone: Principal | ICD-10-CM

## 2016-03-17 DIAGNOSIS — C50919 Malignant neoplasm of unspecified site of unspecified female breast: Secondary | ICD-10-CM

## 2016-03-17 MED ORDER — OXYCODONE-ACETAMINOPHEN 10-325 MG PO TABS: 1.0000 | ORAL_TABLET | ORAL | 0 refills | Status: DC | PRN

## 2016-03-17 MED ORDER — ALPRAZOLAM 0.5 MG PO TABS: ORAL_TABLET | ORAL | 0 refills | Status: DC

## 2016-03-17 MED FILL — OXYCODONE-ACETAMINOPHEN 10-: 10-325 | 30 days supply | Qty: 180 | Fill #0

## 2016-03-17 MED FILL — ALPRAZolam 0.5 MG TABS: 0.5 | 30 days supply | Qty: 90 | Fill #0

## 2016-03-17 NOTE — Telephone Encounter (Signed)
Called patient back and informed her that her prescriptions are ready, pt states she will come get them this afternoon or tomorrow morning.

## 2016-03-17 NOTE — Telephone Encounter (Signed)
Called pt to inform her cancer antigen 27.29 is better per MD. Pt verbalized understanding and denies any questions or concerns at this time.

## 2016-03-17 NOTE — Telephone Encounter (Signed)
I need to know if I can come in tomorrow and pick up a refill for percocet.  I also need xanax.  Return number is [phone].  Call me so I'll know scripts are ready.  Thanks."

## 2016-03-25 ENCOUNTER — Telehealth: Payer: Self-pay | Admitting: Rheumatology

## 2016-03-25 ENCOUNTER — Other Ambulatory Visit: Payer: Self-pay | Admitting: *Deleted

## 2016-03-25 DIAGNOSIS — Z9189 Other specified personal risk factors, not elsewhere classified: Secondary | ICD-10-CM

## 2016-03-25 DIAGNOSIS — Z79899 Other long term (current) drug therapy: Secondary | ICD-10-CM

## 2016-03-25 NOTE — Telephone Encounter (Signed)
[name] with [facility] needs a new standing order for labs.  The patient is at [facility] right now.  Cb#[phone] and fax#[phone].  Thank you

## 2016-03-25 NOTE — Telephone Encounter (Signed)
Lab Orders faxed

## 2016-03-26 LAB — CMP14+EGFR
ALBUMIN: 3.8 g/dL (ref 3.6–4.8)
ALT: 11 IU/L (ref 0–32)
AST: 16 IU/L (ref 0–40)
Albumin/Globulin Ratio: 1.7 (ref 1.2–2.2)
Alkaline Phosphatase: 86 IU/L (ref 39–117)
BUN / CREAT RATIO: 16 (ref 12–28)
BUN: 23 mg/dL (ref 8–27)
Bilirubin Total: 0.3 mg/dL (ref 0.0–1.2)
CO2: 23 mmol/L (ref 18–29)
CREATININE: 1.4 mg/dL — AB (ref 0.57–1.00)
Calcium: 8.6 mg/dL — ABNORMAL LOW (ref 8.7–10.3)
Chloride: 103 mmol/L (ref 96–106)
GFR, EST AFRICAN AMERICAN: 45 mL/min/{1.73_m2} — AB (ref >59)
GFR, EST NON AFRICAN AMERICAN: 39 mL/min/{1.73_m2} — AB (ref >59)
GLUCOSE: 149 mg/dL — AB (ref 65–99)
Globulin, Total: 2.3 g/dL (ref 1.5–4.5)
Potassium: 4.6 mmol/L (ref 3.5–5.2)
Sodium: 140 mmol/L (ref 134–144)
TOTAL PROTEIN: 6.1 g/dL (ref 6.0–8.5)

## 2016-03-26 LAB — CBC WITH DIFFERENTIAL/PLATELET
BASOS: 0 %
Basophils Absolute: 0 10*3/uL (ref 0.0–0.2)
EOS (ABSOLUTE): 0.2 10*3/uL (ref 0.0–0.4)
EOS: 4 %
HEMATOCRIT: 28.8 % — AB (ref 34.0–46.6)
HEMOGLOBIN: 9.2 g/dL — AB (ref 11.1–15.9)
Immature Grans (Abs): 0 10*3/uL (ref 0.0–0.1)
Immature Granulocytes: 0 %
LYMPHS ABS: 0.4 10*3/uL — AB (ref 0.7–3.1)
Lymphs: 11 %
MCH: 30.6 pg (ref 26.6–33.0)
MCHC: 31.9 g/dL (ref 31.5–35.7)
MCV: 96 fL (ref 79–97)
MONOCYTES: 13 %
Monocytes Absolute: 0.5 10*3/uL (ref 0.1–0.9)
NEUTROS ABS: 2.7 10*3/uL (ref 1.4–7.0)
Neutrophils: 72 %
Platelets: 132 10*3/uL — ABNORMAL LOW (ref 150–379)
RBC: 3.01 x10E6/uL — AB (ref 3.77–5.28)
RDW: 18.4 % — ABNORMAL HIGH (ref 12.3–15.4)
WBC: 3.7 10*3/uL (ref 3.4–10.8)

## 2016-04-07 ENCOUNTER — Ambulatory Visit (HOSPITAL_BASED_OUTPATIENT_CLINIC_OR_DEPARTMENT_OTHER): Payer: Medicare Other | Admitting: Nurse Practitioner

## 2016-04-07 ENCOUNTER — Other Ambulatory Visit (HOSPITAL_BASED_OUTPATIENT_CLINIC_OR_DEPARTMENT_OTHER): Payer: Medicare Other

## 2016-04-07 ENCOUNTER — Telehealth: Payer: Self-pay | Admitting: Oncology

## 2016-04-07 VITALS — BP 132/67 | HR 100 | Temp 97.5°F | Resp 18 | Ht 63.0 in | Wt 214.5 lb

## 2016-04-07 DIAGNOSIS — M25552 Pain in left hip: Secondary | ICD-10-CM | POA: Diagnosis not present

## 2016-04-07 DIAGNOSIS — D638 Anemia in other chronic diseases classified elsewhere: Secondary | ICD-10-CM

## 2016-04-07 DIAGNOSIS — N289 Disorder of kidney and ureter, unspecified: Secondary | ICD-10-CM | POA: Diagnosis not present

## 2016-04-07 DIAGNOSIS — C50912 Malignant neoplasm of unspecified site of left female breast: Secondary | ICD-10-CM | POA: Diagnosis not present

## 2016-04-07 DIAGNOSIS — I10 Essential (primary) hypertension: Secondary | ICD-10-CM

## 2016-04-07 DIAGNOSIS — D630 Anemia in neoplastic disease: Secondary | ICD-10-CM | POA: Diagnosis not present

## 2016-04-07 DIAGNOSIS — E119 Type 2 diabetes mellitus without complications: Secondary | ICD-10-CM

## 2016-04-07 DIAGNOSIS — L271 Localized skin eruption due to drugs and medicaments taken internally: Secondary | ICD-10-CM | POA: Diagnosis not present

## 2016-04-07 DIAGNOSIS — D649 Anemia, unspecified: Secondary | ICD-10-CM

## 2016-04-07 DIAGNOSIS — C50919 Malignant neoplasm of unspecified site of unspecified female breast: Secondary | ICD-10-CM

## 2016-04-07 DIAGNOSIS — D6481 Anemia due to antineoplastic chemotherapy: Secondary | ICD-10-CM

## 2016-04-07 DIAGNOSIS — C7951 Secondary malignant neoplasm of bone: Secondary | ICD-10-CM

## 2016-04-07 DIAGNOSIS — M25551 Pain in right hip: Secondary | ICD-10-CM | POA: Diagnosis not present

## 2016-04-07 DIAGNOSIS — M545 Low back pain: Secondary | ICD-10-CM | POA: Diagnosis not present

## 2016-04-07 LAB — CBC WITH DIFFERENTIAL/PLATELET
BASO%: 0.1 % (ref 0.0–2.0)
Basophils Absolute: 0 10*3/uL (ref 0.0–0.1)
EOS%: 4.9 % (ref 0.0–7.0)
Eosinophils Absolute: 0.2 10*3/uL (ref 0.0–0.5)
HCT: 28.6 % — ABNORMAL LOW (ref 34.8–46.6)
HGB: 9.4 g/dL — ABNORMAL LOW (ref 11.6–15.9)
LYMPH#: 0.4 10*3/uL — AB (ref 0.9–3.3)
LYMPH%: 12 % — AB (ref 14.0–49.7)
MCH: 32.7 pg (ref 25.1–34.0)
MCHC: 32.8 g/dL (ref 31.5–36.0)
MCV: 99.7 fL (ref 79.5–101.0)
MONO#: 0.4 10*3/uL (ref 0.1–0.9)
MONO%: 11 % (ref 0.0–14.0)
NEUT#: 2.6 10*3/uL (ref 1.5–6.5)
NEUT%: 72 % (ref 38.4–76.8)
Platelets: 158 10*3/uL (ref 145–400)
RBC: 2.87 10*6/uL — AB (ref 3.70–5.45)
RDW: 22.5 % — AB (ref 11.2–14.5)
WBC: 3.6 10*3/uL — AB (ref 3.9–10.3)

## 2016-04-07 LAB — COMPREHENSIVE METABOLIC PANEL
ALK PHOS: 93 U/L (ref 40–150)
ALT: 9 U/L (ref 0–55)
ANION GAP: 9 meq/L (ref 3–11)
AST: 14 U/L (ref 5–34)
Albumin: 3.5 g/dL (ref 3.5–5.0)
BILIRUBIN TOTAL: 0.41 mg/dL (ref 0.20–1.20)
BUN: 25.6 mg/dL (ref 7.0–26.0)
CO2: 27 meq/L (ref 22–29)
CREATININE: 1.5 mg/dL — AB (ref 0.6–1.1)
Calcium: 9.4 mg/dL (ref 8.4–10.4)
Chloride: 107 mEq/L (ref 98–109)
EGFR: 36 mL/min/{1.73_m2} — ABNORMAL LOW (ref >90)
Glucose: 114 mg/dl (ref 70–140)
Potassium: 4.8 mEq/L (ref 3.5–5.1)
Sodium: 143 mEq/L (ref 136–145)
Total Protein: 6.3 g/dL — ABNORMAL LOW (ref 6.4–8.3)

## 2016-04-07 MED ORDER — MORPHINE SULFATE ER 15 MG PO TBCR: 15.0000 mg | EXTENDED_RELEASE_TABLET | Freq: Two times a day (BID) | ORAL | 0 refills | Status: DC

## 2016-04-07 MED FILL — MORPHINE SULF ER 15 MG TAB: 15 | 30 days supply | Qty: 60 | Fill #0

## 2016-04-07 NOTE — Progress Notes (Signed)
[facility] OFFICE PROGRESS NOTE   Diagnosis:  Breast cancer  INTERVAL HISTORY:   [name] returns as scheduled. She continues Xeloda 7 days on/7 days off. She continues to have intermittent nausea. She notes improvement with Compazine twice a day. She has intermittent loose stools. The loose stools do not occur on a daily basis. She takes Imodium as needed. She had a few mouth sores recently that have now resolved. Palms and soles are painful. She notes peeling of the skin. She is off of Xeloda this week and has noted improvement in the hands and feet. She is having increased pain in her back and hips. She takes Percocet every 4 hours. She is interested in a long-acting pain medication.  Objective:  Vital signs in last 24 hours:  Blood pressure 132/67, pulse 100, temperature 97.5 F (36.4 C), temperature source Oral, resp. rate 18, height '5\' 3"'  (1.6 m), weight 214 lb 8 oz (97.3 kg), SpO2 99 %.    HEENT: No thrush or ulcers. Resp: Lungs clear bilaterally. Cardio: Regular rate and rhythm. GI: No hepatomegaly. Vascular: No leg edema.  Skin: Palms and soles with mild erythema, dryness.    Lab Results:  Lab Results  Component Value Date   WBC 3.6 (L) 04/07/2016   HGB 9.4 (L) 04/07/2016   HCT 28.6 (L) 04/07/2016   MCV 99.7 04/07/2016   PLT 158 04/07/2016   NEUTROABS 2.6 04/07/2016    Imaging:  No results found.  Medications: I have reviewed the patient's current medications.  Assessment/Plan: 1. 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 ER positive, PR positive, HER-2 negative She completed 4 cycles of adjuvant AC chemotherapy June 15 through September 03, 2008, and then completed the left chest wall radiation. She began Arimidex following an office visit on November 11, 2008.  Bone scan 06/01/2013 suggestive of thoracic spine  metastases, thoracic MRI 06/27/2011 consistent with multiple bone metastases involving the thoracolumbar spine   PET scan 07/09/2013 with multiple hypermetabolic bone lesions and hypermetabolic mediastinal nodes.   Left iliac lesion biopsy 07/26/2013. Pathology showed metastatic carcinoma, ER positive, PR positive, HER-2 negative   Initiation of Faslodex 08/10/2013.  Xgeva every 3 months initiated 07/27/2013.  Restaging PET scan 01/23/2014 with no residual hypermetabolic mediastinal disease; marked improvement in the metastatic bone disease.  Restaging PET scan 02/17/2015 with recurrence of skeletal metastasis. New hypermetabolic lesions within the pelvis and ribs. No hypermetabolic lymphadenopathy.  Continuation of Faslodex; initiation of Ibrance 03/05/2015  Cycle 2 Ibrance 04/09/2015-dose reduced to 100 mg daily  Cycle 3 Ibrance 05/08/2015  Cycle 4 Ibrance 06/11/2015  Cycle 5 Ibrance 07/15/2015  PET scan 08/12/2015-increase in size and increased metabolic activity associated with bone metastases, no new lesions  Ibrance/Faslodex discontinued  Tamoxifen started 08/07/2015  PET scan 02/04/2016 with new hypermetabolic liver lesions and numerous new skeletal lesions.  Xeloda, 7 days on/7 days off initiated February 2018 2. Left chest subcutaneous mass noted on exam October 04, 2008, status post biopsy by Dr. Brantley Stage with a benign pathology. 3. History of delayed healing of the left mastectomy incision. 4. Rheumatoid arthritis. 5. Diabetes. 6. Hypercholesterolemia. 7. Hypertension. 8. Family history of breast cancer. She has been evaluated at the genetic screening clinic. 9. 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  10. 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 Anemia secondary to  metastatic breast cancer, chronic disease, renal insufficiency, and chemotherapy     Disposition: [name] appears stable. She will continue Xeloda 7 days on/7 days off.  She has developed mild hand-foot syndrome related to the Xeloda. The symptoms improve during the week off of Xeloda. She is scheduled to resume Xeloda 04/11/2016. If the current symptoms are worse she will not resume Xeloda and will contact the office.  For pain control she is taking Percocet every 4 hours. She would like to begin a long-acting pain medication. She was given a prescription for MS Contin 15 mg every 12 hours. She understands she cannot drive while on pain medication.  She will return for labs and a follow-up visit in 4 weeks. She will contact the office in the interim as outlined above or with any other problems.   Plan reviewed with Dr. Benay Spice. 25 minutes were spent face-to-face at today's visit with the majority of that time involved in counseling/coordination of care.    [name] ANP/GNP-BC   04/07/2016  11:49 AM

## 2016-04-07 NOTE — Telephone Encounter (Signed)
Gave patient avs report and appointments for April  °

## 2016-04-08 LAB — CANCER ANTIGEN 27.29: CAN 27.29: 43.1 U/mL — AB (ref 0.0–38.6)

## 2016-04-09 ENCOUNTER — Other Ambulatory Visit: Payer: Self-pay | Admitting: Oncology

## 2016-04-12 MED FILL — XELODA 500 MG TABLET: 500 | 28 days supply | Qty: 84 | Fill #0

## 2016-04-12 NOTE — Telephone Encounter (Signed)
Refill request from [name]. Called pt: she reports her hands and feet feel better after switching to bag balm and wearing socks and gloves to bed. Still having loose stools, she feels these are tolerable. Informed her that Xeloda script will be sent to pharmacy today. She voiced understanding.

## 2016-04-14 ENCOUNTER — Other Ambulatory Visit: Payer: Self-pay | Admitting: *Deleted

## 2016-04-14 ENCOUNTER — Telehealth: Payer: Self-pay

## 2016-04-14 DIAGNOSIS — C50919 Malignant neoplasm of unspecified site of unspecified female breast: Secondary | ICD-10-CM

## 2016-04-14 DIAGNOSIS — C7951 Secondary malignant neoplasm of bone: Principal | ICD-10-CM

## 2016-04-14 MED ORDER — OXYCODONE-ACETAMINOPHEN 10-325 MG PO TABS: 1.0000 | ORAL_TABLET | ORAL | 0 refills | Status: DC | PRN

## 2016-04-14 NOTE — Telephone Encounter (Signed)
Pt called for her percocet refill for pick up tomorrow.

## 2016-04-14 NOTE — Telephone Encounter (Signed)
Notified patient prescription for Percocet 10-325 mg ready for pick up. Patient verbalized understanding.

## 2016-04-15 MED FILL — OXYCODONE-ACETAMINOPHEN 10-: 10-325 | 30 days supply | Qty: 180 | Fill #0

## 2016-04-23 ENCOUNTER — Other Ambulatory Visit: Payer: Self-pay | Admitting: *Deleted

## 2016-04-23 ENCOUNTER — Ambulatory Visit: Payer: Medicare Other | Admitting: Rheumatology

## 2016-04-23 NOTE — Telephone Encounter (Signed)
Back on February visit, my notes as the following ==> Because the patient is under breasts cancer medication called Xeloda, it is possibly get a cause her more anemia.As a result we decreased her Arava to every other day during this time.When she is not on the Xeloda, we can have her take the medication every day.I'm not sure if she is finished with the Xeloda at this time or not. If she is finished that she can go back to [place] every day areaIf she is still taking it that she needs to take it every other day.I left a message for the patient to call us back and let us know one way or the other so we can refill her medications accordingly.

## 2016-04-23 NOTE — Telephone Encounter (Signed)
Refill requests received via fax  Last Visit: 02/25/16 Next Visit: 04/28/16 Labs: 03/25/16 CMP with GFR is within normal limits except elevated nonfasting glucose at 149 Elevated creatinine 1.4 (this is similar to her past labs) GFR is at 39 (similar to past labs) Slight decrease in calcium at 8.6 (patient should take calcium supplement daily)  Okay to refill Arava and Prednisone?

## 2016-04-26 DIAGNOSIS — R5383 Other fatigue: Secondary | ICD-10-CM | POA: Insufficient documentation

## 2016-04-26 MED ORDER — LEFLUNOMIDE 10 MG PO TABS: 10.0000 mg | ORAL_TABLET | ORAL | 2 refills | Status: DC

## 2016-04-26 MED ORDER — PREDNISONE 1 MG PO TABS: 1.0000 mg | ORAL_TABLET | Freq: Every day | ORAL | 1 refills | Status: DC

## 2016-04-26 NOTE — Telephone Encounter (Signed)
Patient is still currently on the Xeloda. Patient has been taking the [place] every other day as discussed at her office visit in February.

## 2016-04-26 NOTE — Progress Notes (Signed)
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 female   Patient last seen by our office on 02/25/2016 for seropositive rheumatoid arthritis. She also carries a history of left breast cancer for which she is getting treatment from Dr. Gearldine Shown office.  Recently, approximately 02/29/2016, she was put on Xeloda. Initially, the plan was to use Xeloda for about 3 months. Today, patient reports that Dr. Benay Spice feels that she may benefit from being on therapy for about 5 months.  As of today, she's been on Xeloda for exactly 2 months and based on the new plan, 3 more months remaining.  For her seropositive rheumatoid arthritis, she is using the following: Plaquenil 200 mg every day Arava 10 mg every other day (due to risk of anemia with the Xeloda, we've advised the patient not to use it daily while on Xeloda). Prednisone 1 mg daily  Patient is doing well with her joints at this time. She is not flaring despite being on lower dose of Arava. At the last visit on 02/25/2016, she did not have any synovitis on examination and in fact she had very good improvement compared to the visit before the February 2018 visit. Today she continues to do just as well now as she did on the last visit in February.  She continues to have cysts on the left wrist but it is not bothering her at this time. We have decided to watch the cyst and not intervene unless necessary.  Activities of Daily Living:  Patient reports morning stiffness for 15 minutes.   Patient Denies nocturnal pain.  Difficulty dressing/grooming: Denies Difficulty climbing stairs: Reports Difficulty getting out of chair:  Reports Difficulty using hands for taps, buttons, cutlery, and/or writing: Reports   Review of Systems  Constitutional: Negative for fatigue.  HENT: Negative for mouth sores and mouth dryness.   Eyes: Negative for dryness.  Respiratory: Negative for shortness of breath.   Gastrointestinal: Negative for constipation and diarrhea.  Musculoskeletal: Negative for myalgias and myalgias.  Skin: Negative for sensitivity to sunlight.  Psychiatric/Behavioral: Negative for decreased concentration and sleep disturbance.    PMFS History:  Patient Active Problem List   Diagnosis Date Noted  . Other fatigue 04/26/2016  . Seropositive rheumatoid arthritis of multiple sites ([place]) 11/22/2015  . 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 High risk medication use 11/22/2015  . Low kidney function 11/22/2015  . Breast cancer metastasized to bone ([place]) 07/27/2013  . History of breast cancer 02/04/2012  . Breast cancer ([place]) 01/04/2012    Past Medical History:  Diagnosis Date  . Arthritis   . Bone metastases ([place])    breast primary  . Breast cancer ([place])   . Cancer ([place])    left breast  . Diabetes mellitus   . History of radiation therapy 10/2008   left chest wall, left supraclavicular region, mastectomy scar  . Hyperlipidemia   . Hypertension     Family History  Problem Relation Age of Onset  . Diabetes Mother   . Hypertension Mother   .  Heart disease Mother   . Diabetes Father   . Hypertension Father   . Heart disease Father   . Cancer Sister     breast   Past Surgical History:  Procedure Laterality Date  . BONE DENSITY TEST  2008  . BREAST SURGERY    . CHOLECYSTECTOMY    . COLONOSCOPY  2007  . MASTECTOMY  2010   LEFT BREAST  . PORT-A-CATH REMOVAL    . PORTACATH PLACEMENT    . REPLACEMENT TOTAL KNEE  2008   RIGHT KNEE  . TOTAL HIP ARTHROPLASTY  2004   RIGHT HIP   Social History   Social History Narrative  . No narrative on file     Objective: Vital Signs: BP  130/76   Pulse 78   Resp 16   Wt 183 lb (83 kg)   BMI 32.42 kg/m    Physical Exam  Constitutional: She is oriented to person, place, and time. She appears well-developed and well-nourished.  HENT:  Head: Normocephalic and atraumatic.  Eyes: EOM are normal. Pupils are equal, round, and reactive to light.  Cardiovascular: Normal rate, regular rhythm and normal heart sounds.  Exam reveals no gallop and no friction rub.   No murmur heard. Pulmonary/Chest: Effort normal and breath sounds normal. She has no wheezes. She has no rales.  Abdominal: Soft. Bowel sounds are normal. She exhibits no distension. There is no tenderness. There is no guarding. No hernia.  Musculoskeletal: Normal range of motion. She exhibits no edema, tenderness or deformity.  Lymphadenopathy:    She has no cervical adenopathy.  Neurological: She is alert and oriented to person, place, and time. Coordination normal.  Skin: Skin is warm and dry. Capillary refill takes less than 2 seconds. No rash noted.  Psychiatric: She has a normal mood and affect. Her behavior is normal.  Nursing note and vitals reviewed.    Musculoskeletal Exam:  Full range of motion of all joints except for bilateral shoulders with decreased range of motion Grip strength is equal and strong bilaterally Fiber myalgia tender points are absent Patient does have a cyst to the left wrist. Hasn't gotten bigger or worse. We will watch.  CDAI Exam: CDAI Homunculus Exam:   Joint Counts:  CDAI Tender Joint count: 0 CDAI Swollen Joint count: 0  Global Assessments:  Patient Global Assessment: 2 Provider Global Assessment: 2  CDAI Calculated Score: 4    Investigation: No additional findings. Appointment on 04/07/2016  Component Date Value Ref Range Status  . WBC 04/07/2016 3.6* 3.9 - 10.3 10e3/uL Final  . NEUT# 04/07/2016 2.6  1.5 - 6.5 10e3/uL Final  . HGB 04/07/2016 9.4* 11.6 - 15.9 g/dL Final  . HCT 04/07/2016 28.6* 34.8 - 46.6 % Final   . Platelets 04/07/2016 158  145 - 400 10e3/uL Final  . MCV 04/07/2016 99.7  79.5 - 101.0 fL Final  . MCH 04/07/2016 32.7  25.1 - 34.0 pg Final  . MCHC 04/07/2016 32.8  31.5 - 36.0 g/dL Final  . RBC 04/07/2016 2.87* 3.70 - 5.45 10e6/uL Final  . RDW 04/07/2016 22.5* 11.2 - 14.5 % Final  . lymph# 04/07/2016 0.4* 0.9 - 3.3 10e3/uL Final  . MONO# 04/07/2016 0.4  0.1 - 0.9 10e3/uL Final  . Eosinophils Absolute 04/07/2016 0.2  0.0 - 0.5 10e3/uL Final  . Basophils Absolute 04/07/2016 0.0  0.0 - 0.1 10e3/uL Final  . NEUT% 04/07/2016 72.0  38.4 - 76.8 % Final  . LYMPH% 04/07/2016 12.0* 14.0 - 49.7 %  Final  . MONO% 04/07/2016 11.0  0.0 - 14.0 % Final  . EOS% 04/07/2016 4.9  0.0 - 7.0 % Final  . BASO% 04/07/2016 0.1  0.0 - 2.0 % Final  . Sodium 04/07/2016 143  136 - 145 mEq/L Final  . Potassium 04/07/2016 4.8  3.5 - 5.1 mEq/L Final  . Chloride 04/07/2016 107  98 - 109 mEq/L Final  . CO2 04/07/2016 27  22 - 29 mEq/L Final  . Glucose 04/07/2016 114  70 - 140 mg/dl Final  . BUN 04/07/2016 25.6  7.0 - 26.0 mg/dL Final  . Creatinine 04/07/2016 1.5* 0.6 - 1.1 mg/dL Final  . Total Bilirubin 04/07/2016 0.41  0.20 - 1.20 mg/dL Final  . Alkaline Phosphatase 04/07/2016 93  40 - 150 U/L Final  . AST 04/07/2016 14  5 - 34 U/L Final  . ALT 04/07/2016 9  0 - 55 U/L Final  . Total Protein 04/07/2016 6.3* 6.4 - 8.3 g/dL Final  . Albumin 04/07/2016 3.5  3.5 - 5.0 g/dL Final  . Calcium 04/07/2016 9.4  8.4 - 10.4 mg/dL Final  . Anion Gap 04/07/2016 9  3 - 11 mEq/L Final  . EGFR 04/07/2016 36* >90 ml/min/1.73 m2 Final  . CA 27.29 04/07/2016 43.1* 0.0 - 38.6 U/mL Final  Orders Only on 03/25/2016  Component Date Value Ref Range Status  . Glucose 03/25/2016 149* 65 - 99 mg/dL Final  . BUN 03/25/2016 23  8 - 27 mg/dL Final  . Creatinine, Ser 03/25/2016 1.40* 0.57 - 1.00 mg/dL Final  . GFR calc non Af Amer 03/25/2016 39* >59 mL/min/1.73 Final  . GFR calc Af Amer 03/25/2016 45* >59 mL/min/1.73 Final  .  BUN/Creatinine Ratio 03/25/2016 16  12 - 28 Final  . Sodium 03/25/2016 140  134 - 144 mmol/L Final  . Potassium 03/25/2016 4.6  3.5 - 5.2 mmol/L Final  . Chloride 03/25/2016 103  96 - 106 mmol/L Final  . CO2 03/25/2016 23  18 - 29 mmol/L Final  . Calcium 03/25/2016 8.6* 8.7 - 10.3 mg/dL Final  . Total Protein 03/25/2016 6.1  6.0 - 8.5 g/dL Final  . Albumin 03/25/2016 3.8  3.6 - 4.8 g/dL Final  . Globulin, Total 03/25/2016 2.3  1.5 - 4.5 g/dL Final  . Albumin/Globulin Ratio 03/25/2016 1.7  1.2 - 2.2 Final  . Bilirubin Total 03/25/2016 0.3  0.0 - 1.2 mg/dL Final  . Alkaline Phosphatase 03/25/2016 86  39 - 117 IU/L Final  . AST 03/25/2016 16  0 - 40 IU/L Final  . ALT 03/25/2016 11  0 - 32 IU/L Final  . WBC 03/25/2016 3.7  3.4 - 10.8 x10E3/uL Final  . RBC 03/25/2016 3.01* 3.77 - 5.28 x10E6/uL Final  . Hemoglobin 03/25/2016 9.2* 11.1 - 15.9 g/dL Final  . Hematocrit 03/25/2016 28.8* 34.0 - 46.6 % Final  . MCV 03/25/2016 96  79 - 97 fL Final  . MCH 03/25/2016 30.6  26.6 - 33.0 pg Final  . MCHC 03/25/2016 31.9  31.5 - 35.7 g/dL Final  . RDW 03/25/2016 18.4* 12.3 - 15.4 % Final  . Platelets 03/25/2016 132* 150 - 379 x10E3/uL Final  . Neutrophils 03/25/2016 72  Not Estab. % Final  . Lymphs 03/25/2016 11  Not Estab. % Final  . Monocytes 03/25/2016 13  Not Estab. % Final  . Eos 03/25/2016 4  Not Estab. % Final  . Basos 03/25/2016 0  Not Estab. % Final  . Neutrophils Absolute 03/25/2016 2.7  1.4 - 7.0  x10E3/uL Final  . Lymphocytes Absolute 03/25/2016 0.4* 0.7 - 3.1 x10E3/uL Final  . Monocytes Absolute 03/25/2016 0.5  0.1 - 0.9 x10E3/uL Final  . EOS (ABSOLUTE) 03/25/2016 0.2  0.0 - 0.4 x10E3/uL Final  . Basophils Absolute 03/25/2016 0.0  0.0 - 0.2 x10E3/uL Final  . Immature Granulocytes 03/25/2016 0  Not Estab. % Final  . Immature Grans (Abs) 03/25/2016 0.0  0.0 - 0.1 x10E3/uL Final  Appointment on 03/10/2016  Component Date Value Ref Range Status  . WBC 03/10/2016 3.3* 3.9 - 10.3 10e3/uL  Final  . NEUT# 03/10/2016 2.1  1.5 - 6.5 10e3/uL Final  . HGB 03/10/2016 9.5* 11.6 - 15.9 g/dL Final  . HCT 03/10/2016 30.1* 34.8 - 46.6 % Final  . Platelets 03/10/2016 119* 145 - 400 10e3/uL Final  . MCV 03/10/2016 94.7  79.5 - 101.0 fL Final  . MCH 03/10/2016 29.9  25.1 - 34.0 pg Final  . MCHC 03/10/2016 31.6  31.5 - 36.0 g/dL Final  . RBC 03/10/2016 3.18* 3.70 - 5.45 10e6/uL Final  . RDW 03/10/2016 14.9* 11.2 - 14.5 % Final  . lymph# 03/10/2016 0.6* 0.9 - 3.3 10e3/uL Final  . MONO# 03/10/2016 0.5  0.1 - 0.9 10e3/uL Final  . Eosinophils Absolute 03/10/2016 0.2  0.0 - 0.5 10e3/uL Final  . Basophils Absolute 03/10/2016 0.0  0.0 - 0.1 10e3/uL Final  . NEUT% 03/10/2016 64.2  38.4 - 76.8 % Final  . LYMPH% 03/10/2016 17.4  14.0 - 49.7 % Final  . MONO% 03/10/2016 13.8  0.0 - 14.0 % Final  . EOS% 03/10/2016 4.6  0.0 - 7.0 % Final  . BASO% 03/10/2016 0.0  0.0 - 2.0 % Final  . Sodium 03/10/2016 139  136 - 145 mEq/L Final  . Potassium 03/10/2016 4.4  3.5 - 5.1 mEq/L Final  . Chloride 03/10/2016 108  98 - 109 mEq/L Final  . CO2 03/10/2016 24  22 - 29 mEq/L Final  . Glucose 03/10/2016 111  70 - 140 mg/dl Final  . BUN 03/10/2016 27.7* 7.0 - 26.0 mg/dL Final  . Creatinine 03/10/2016 1.5* 0.6 - 1.1 mg/dL Final  . Total Bilirubin 03/10/2016 0.30  0.20 - 1.20 mg/dL Final  . Alkaline Phosphatase 03/10/2016 83  40 - 150 U/L Final  . AST 03/10/2016 17  5 - 34 U/L Final  . ALT 03/10/2016 13  0 - 55 U/L Final  . Total Protein 03/10/2016 6.1* 6.4 - 8.3 g/dL Final  . Albumin 03/10/2016 3.3* 3.5 - 5.0 g/dL Final  . Calcium 03/10/2016 8.7  8.4 - 10.4 mg/dL Final  . Anion Gap 03/10/2016 7  3 - 11 mEq/L Final  . EGFR 03/10/2016 36* >90 ml/min/1.73 m2 Final  . CA 27.29 03/10/2016 34.3  0.0 - 38.6 U/mL Final  Office Visit on 02/25/2016  Component Date Value Ref Range Status  . Glucose 02/25/2016 102* 65 - 99 mg/dL Final  . BUN 02/25/2016 31* 8 - 27 mg/dL Final  . Creatinine, Ser 02/25/2016 1.52* 0.57 -  1.00 mg/dL Final  . GFR calc non Af Amer 02/25/2016 36* >59 mL/min/1.73 Final  . GFR calc Af Amer 02/25/2016 41* >59 mL/min/1.73 Final  . BUN/Creatinine Ratio 02/25/2016 20  12 - 28 Final  . Sodium 02/25/2016 141  134 - 144 mmol/L Final  . Potassium 02/25/2016 4.5  3.5 - 5.2 mmol/L Final  . Chloride 02/25/2016 106  96 - 106 mmol/L Final  . CO2 02/25/2016 17* 18 - 29 mmol/L Final  . Calcium 02/25/2016  8.5* 8.7 - 10.3 mg/dL Final  . Total Protein 02/25/2016 6.7  6.0 - 8.5 g/dL Final  . Albumin 02/25/2016 4.0  3.6 - 4.8 g/dL Final  . Globulin, Total 02/25/2016 2.7  1.5 - 4.5 g/dL Final  . Albumin/Globulin Ratio 02/25/2016 1.5  1.2 - 2.2 Final  . Bilirubin Total 02/25/2016 0.2  0.0 - 1.2 mg/dL Final  . Alkaline Phosphatase 02/25/2016 91  39 - 117 IU/L Final  . AST 02/25/2016 17  0 - 40 IU/L Final  . ALT 02/25/2016 15  0 - 32 IU/L Final  . WBC 02/25/2016 4.8  3.4 - 10.8 x10E3/uL Final  . RBC 02/25/2016 3.71* 3.77 - 5.28 x10E6/uL Final  . Hemoglobin 02/25/2016 10.8* 11.1 - 15.9 g/dL Final  . Hematocrit 02/25/2016 33.8* 34.0 - 46.6 % Final  . MCV 02/25/2016 91  79 - 97 fL Final  . MCH 02/25/2016 29.1  26.6 - 33.0 pg Final  . MCHC 02/25/2016 32.0  31.5 - 35.7 g/dL Final  . RDW 02/25/2016 15.1  12.3 - 15.4 % Final  . Platelets 02/25/2016 148* 150 - 379 x10E3/uL Final  . Neutrophils 02/25/2016 77  Not Estab. % Final  . Lymphs 02/25/2016 12  Not Estab. % Final  . Monocytes 02/25/2016 8  Not Estab. % Final  . Eos 02/25/2016 3  Not Estab. % Final  . Basos 02/25/2016 0  Not Estab. % Final  . Neutrophils Absolute 02/25/2016 3.7  1.4 - 7.0 x10E3/uL Final  . Lymphocytes Absolute 02/25/2016 0.6* 0.7 - 3.1 x10E3/uL Final  . Monocytes Absolute 02/25/2016 0.4  0.1 - 0.9 x10E3/uL Final  . EOS (ABSOLUTE) 02/25/2016 0.1  0.0 - 0.4 x10E3/uL Final  . Basophils Absolute 02/25/2016 0.0  0.0 - 0.2 x10E3/uL Final  . Immature Granulocytes 02/25/2016 0  Not Estab. % Final  . Immature Grans (Abs) 02/25/2016 0.0   0.0 - 0.1 x10E3/uL Final  . Vit D, 25-Hydroxy 02/25/2016 28.1* 30.0 - 100.0 ng/mL Final   Comment: Vitamin D deficiency has been defined by the [place] and an Endocrine Society practice guideline as a level of serum 25-OH vitamin D less than 20 ng/mL (1,2). The Endocrine Society went on to further define vitamin D insufficiency as a level between 21 and 29 ng/mL (2). 1. IOM (Institute of Medicine). 2010. Dietary reference    intakes for calcium and D. [place]: The    [employer]. 2. Holick MF, Binkley Gerlach, Bischoff-Ferrari HA, et al.    Evaluation, treatment, and prevention of vitamin D    deficiency: an Endocrine Society clinical practice    guideline. JCEM. 2011 Jul; 96(7):1911-30.   Hospital Outpatient Visit on 02/04/2016  Component Date Value Ref Range Status  . Glucose-Capillary 02/04/2016 90  65 - 99 mg/dL Final  Appointment on 02/04/2016  Component Date Value Ref Range Status  . WBC 02/04/2016 5.0  3.9 - 10.3 10e3/uL Final  . NEUT# 02/04/2016 3.5  1.5 - 6.5 10e3/uL Final  . HGB 02/04/2016 11.3* 11.6 - 15.9 g/dL Final  . HCT 02/04/2016 35.1  34.8 - 46.6 % Final  . Platelets 02/04/2016 146  145 - 400 10e3/uL Final  . MCV 02/04/2016 92.1  79.5 - 101.0 fL Final  . MCH 02/04/2016 29.7  25.1 - 34.0 pg Final  . MCHC 02/04/2016 32.2  31.5 - 36.0 g/dL Final  . RBC 02/04/2016 3.81  3.70 - 5.45 10e6/uL Final  . RDW 02/04/2016 14.3  11.2 - 14.5 % Final  .  lymph# 02/04/2016 0.6* 0.9 - 3.3 10e3/uL Final  . MONO# 02/04/2016 0.8  0.1 - 0.9 10e3/uL Final  . Eosinophils Absolute 02/04/2016 0.1  0.0 - 0.5 10e3/uL Final  . Basophils Absolute 02/04/2016 0.0  0.0 - 0.1 10e3/uL Final  . NEUT% 02/04/2016 69.2  38.4 - 76.8 % Final  . LYMPH% 02/04/2016 12.8* 14.0 - 49.7 % Final  . MONO% 02/04/2016 15.6* 0.0 - 14.0 % Final  . EOS% 02/04/2016 2.4  0.0 - 7.0 % Final  . BASO% 02/04/2016 0.0  0.0 - 2.0 % Final  . Sodium 02/04/2016 138  136 - 145 mEq/L Final  .  Potassium 02/04/2016 5.1  3.5 - 5.1 mEq/L Final  . Chloride 02/04/2016 107  98 - 109 mEq/L Final  . CO2 02/04/2016 21* 22 - 29 mEq/L Final  . Glucose 02/04/2016 79  70 - 140 mg/dl Final  . BUN 02/04/2016 29.3* 7.0 - 26.0 mg/dL Final  . Creatinine 02/04/2016 1.6* 0.6 - 1.1 mg/dL Final  . Total Bilirubin 02/04/2016 0.30  0.20 - 1.20 mg/dL Final  . Alkaline Phosphatase 02/04/2016 92  40 - 150 U/L Final  . AST 02/04/2016 20  5 - 34 U/L Final  . ALT 02/04/2016 17  0 - 55 U/L Final  . Total Protein 02/04/2016 7.0  6.4 - 8.3 g/dL Final  . Albumin 02/04/2016 3.5  3.5 - 5.0 g/dL Final  . Calcium 02/04/2016 10.2  8.4 - 10.4 mg/dL Final  . Anion Gap 02/04/2016 10  3 - 11 mEq/L Final  . EGFR 02/04/2016 33* >90 ml/min/1.73 m2 Final  . CA 27.29 02/04/2016 53.2* 0.0 - 38.6 U/mL Final  Appointment on 12/23/2015  Component Date Value Ref Range Status  . WBC 12/23/2015 5.5  3.9 - 10.3 10e3/uL Final  . NEUT# 12/23/2015 4.4  1.5 - 6.5 10e3/uL Final  . HGB 12/23/2015 11.9  11.6 - 15.9 g/dL Final  . HCT 12/23/2015 37.2  34.8 - 46.6 % Final  . Platelets 12/23/2015 149  145 - 400 10e3/uL Final  . MCV 12/23/2015 92.7  79.5 - 101.0 fL Final  . MCH 12/23/2015 29.6  25.1 - 34.0 pg Final  . MCHC 12/23/2015 32.0  31.5 - 36.0 g/dL Final  . RBC 12/23/2015 4.01  3.70 - 5.45 10e6/uL Final  . RDW 12/23/2015 14.8* 11.2 - 14.5 % Final  . lymph# 12/23/2015 0.5* 0.9 - 3.3 10e3/uL Final  . MONO# 12/23/2015 0.5  0.1 - 0.9 10e3/uL Final  . Eosinophils Absolute 12/23/2015 0.1  0.0 - 0.5 10e3/uL Final  . Basophils Absolute 12/23/2015 0.0  0.0 - 0.1 10e3/uL Final  . NEUT% 12/23/2015 79.9* 38.4 - 76.8 % Final  . LYMPH% 12/23/2015 8.8* 14.0 - 49.7 % Final  . MONO% 12/23/2015 8.7  0.0 - 14.0 % Final  . EOS% 12/23/2015 2.4  0.0 - 7.0 % Final  . BASO% 12/23/2015 0.2  0.0 - 2.0 % Final  . Sodium 12/23/2015 140  136 - 145 mEq/L Final  . Potassium 12/23/2015 4.7  3.5 - 5.1 mEq/L Final  . Chloride 12/23/2015 109  98 - 109 mEq/L  Final  . CO2 12/23/2015 20* 22 - 29 mEq/L Final  . Glucose 12/23/2015 130  70 - 140 mg/dl Final  . BUN 12/23/2015 33.5* 7.0 - 26.0 mg/dL Final  . Creatinine 12/23/2015 1.5* 0.6 - 1.1 mg/dL Final  . Total Bilirubin 12/23/2015 0.23  0.20 - 1.20 mg/dL Final  . Alkaline Phosphatase 12/23/2015 110  40 - 150 U/L Final  .  AST 12/23/2015 26  5 - 34 U/L Final  . ALT 12/23/2015 24  0 - 55 U/L Final  . Total Protein 12/23/2015 7.0  6.4 - 8.3 g/dL Final  . Albumin 12/23/2015 3.3* 3.5 - 5.0 g/dL Final  . Calcium 12/23/2015 8.4  8.4 - 10.4 mg/dL Final  . Anion Gap 12/23/2015 11  3 - 11 mEq/L Final  . EGFR 12/23/2015 38* >90 ml/min/1.73 m2 Final  . CA 27.29 12/23/2015 41.7* 0.0 - 38.6 U/mL Final  Appointment on 11/25/2015  Component Date Value Ref Range Status  . Sodium 11/25/2015 140  136 - 145 mEq/L Final  . Potassium 11/25/2015 5.1  3.5 - 5.1 mEq/L Final  . Chloride 11/25/2015 112* 98 - 109 mEq/L Final  . CO2 11/25/2015 19* 22 - 29 mEq/L Final  . Glucose 11/25/2015 110  70 - 140 mg/dl Final  . BUN 11/25/2015 34.6* 7.0 - 26.0 mg/dL Final  . Creatinine 11/25/2015 1.4* 0.6 - 1.1 mg/dL Final  . Total Bilirubin 11/25/2015 0.26  0.20 - 1.20 mg/dL Final  . Alkaline Phosphatase 11/25/2015 106  40 - 150 U/L Final  . AST 11/25/2015 23  5 - 34 U/L Final  . ALT 11/25/2015 22  0 - 55 U/L Final  . Total Protein 11/25/2015 7.2  6.4 - 8.3 g/dL Final  . Albumin 11/25/2015 3.4* 3.5 - 5.0 g/dL Final  . Calcium 11/25/2015 8.5  8.4 - 10.4 mg/dL Final  . Anion Gap 11/25/2015 9  3 - 11 mEq/L Final  . EGFR 11/25/2015 39* >90 ml/min/1.73 m2 Final  . CA 27.29 11/25/2015 46.9* 0.0 - 38.6 U/mL Final     Imaging: No results found.  Speciality Comments: No specialty comments available.    Procedures:  No procedures performed Allergies: Doxycycline   Assessment / Plan:     Visit Diagnoses: Seropositive rheumatoid arthritis of multiple sites (HCC)  High risk medication use - arava 35m qod (while on XELODA  due to incr risk of anemia) (adeq response)plq 2028mqdprednisone 29m2md  Malignant neoplasm of left female breast, unspecified estrogen receptor status, unspecified site of breast (HCC[place]rievilleHistory of breast cancer  Carcinoma of breast metastatic to bone, unspecified laterality (HCC)  Other fatigue   Dr. SheLearta Coddingncologist) has updated his plan to use the Xeloda. Initially, the plan was to use it for about 3 months. Since patient's hormone markers are very good, Dr. SheBenay Spiceels that she can get extended exposure to Xeloda for about 5 months. Note that we are giving the patient Arava and he can cause further anemia and because of that reason were asking the patient to take [place] a every other day basis. Even though the bottle states every day, patient is aware to use it every other day. I put every day on Ara[place]ceolacause when I wrote the prescription she was nearly finished with her 3 months of therapy with Xeloda and she would be moving on to every day dosage at that time.   Patient plans to see her oncologist/hematologist this coming Tuesday where she will get blood work. She will do a CBC with differential and CMP with GFR at that visit for us [place] they're not been into it further own needs. Since were both on Epic O be able to look at those values. Patient has been stable on her hemoglobin and hematocrit red blood cell overall. Dr. SheBenay Spice well as our office will monitor the patient's blood work to be sure that there is  no adverse affects from our medication and his medication.  #2: Patient is doing very well with her joints. They're not having any additional pain or discomfort despite being on Arava every other day. She is also taking prednisone 67m daily. Also taking plq 2031mdaily. And as already stated, pt is on ARAVA 10MG every other day for now while she is on XELODA (BREAST CANCER CHEMOTHERAPY (WILL USE FOR ABOUT 5 MONTHS TOTAL (STARTED TXRodeoEB 11, 2018.) ( WILL SEE DR.  [place]incoln ParkN Tuesday, APRIL17, 2018).   Orders: No orders of the defined types were placed in this encounter.  No orders of the defined types were placed in this encounter.   Face-to-face time spent with patient was 30 minutes. 50% of time was spent in counseling and coordination of care.  Follow-Up Instructions: Return in about 2 months (around 06/28/2016) for RA,PLQ 200MG qd//Arava1033mOD//Pred 1mg86mM//lt wrist cyst//breast cancer (dr sherLearta Codding NaitEliezer Lofts-C  Note - This record has been created using [employer] creation errors have been sought, but may not always  have been located. Such creation errors do not reflect on  the standard of medical care.

## 2016-04-26 NOTE — Telephone Encounter (Signed)
I will refill patient [name] and she should continue taking it every other day until she is off of Xeloda and she talks to [place] again.

## 2016-04-28 ENCOUNTER — Ambulatory Visit (INDEPENDENT_AMBULATORY_CARE_PROVIDER_SITE_OTHER): Payer: Medicare Other | Admitting: Rheumatology

## 2016-04-28 ENCOUNTER — Encounter: Payer: Self-pay | Admitting: Rheumatology

## 2016-04-28 VITALS — BP 130/76 | HR 78 | Resp 16 | Wt 183.0 lb

## 2016-04-28 DIAGNOSIS — C50912 Malignant neoplasm of unspecified site of left female breast: Secondary | ICD-10-CM | POA: Diagnosis not present

## 2016-04-28 DIAGNOSIS — M0579 Rheumatoid arthritis with rheumatoid factor of multiple sites without organ or systems involvement: Secondary | ICD-10-CM

## 2016-04-28 DIAGNOSIS — C50919 Malignant neoplasm of unspecified site of unspecified female breast: Secondary | ICD-10-CM

## 2016-04-28 DIAGNOSIS — C7951 Secondary malignant neoplasm of bone: Secondary | ICD-10-CM | POA: Diagnosis not present

## 2016-04-28 DIAGNOSIS — Z853 Personal history of malignant neoplasm of breast: Secondary | ICD-10-CM | POA: Diagnosis not present

## 2016-04-28 DIAGNOSIS — Z79899 Other long term (current) drug therapy: Secondary | ICD-10-CM

## 2016-04-28 DIAGNOSIS — R5383 Other fatigue: Secondary | ICD-10-CM | POA: Diagnosis not present

## 2016-05-03 ENCOUNTER — Other Ambulatory Visit: Payer: Self-pay | Admitting: Oncology

## 2016-05-03 DIAGNOSIS — C50919 Malignant neoplasm of unspecified site of unspecified female breast: Secondary | ICD-10-CM

## 2016-05-03 DIAGNOSIS — C7951 Secondary malignant neoplasm of bone: Principal | ICD-10-CM

## 2016-05-04 ENCOUNTER — Other Ambulatory Visit: Payer: Self-pay | Admitting: *Deleted

## 2016-05-04 ENCOUNTER — Ambulatory Visit (HOSPITAL_BASED_OUTPATIENT_CLINIC_OR_DEPARTMENT_OTHER): Payer: Medicare Other | Admitting: Oncology

## 2016-05-04 ENCOUNTER — Ambulatory Visit (HOSPITAL_BASED_OUTPATIENT_CLINIC_OR_DEPARTMENT_OTHER): Payer: Medicare Other

## 2016-05-04 ENCOUNTER — Telehealth: Payer: Self-pay | Admitting: Oncology

## 2016-05-04 ENCOUNTER — Telehealth: Payer: Self-pay | Admitting: Rheumatology

## 2016-05-04 ENCOUNTER — Other Ambulatory Visit (HOSPITAL_BASED_OUTPATIENT_CLINIC_OR_DEPARTMENT_OTHER): Payer: Medicare Other

## 2016-05-04 VITALS — BP 127/50 | HR 83 | Temp 97.8°F | Resp 17 | Ht 63.0 in | Wt 214.5 lb

## 2016-05-04 DIAGNOSIS — D6481 Anemia due to antineoplastic chemotherapy: Secondary | ICD-10-CM | POA: Diagnosis not present

## 2016-05-04 DIAGNOSIS — I10 Essential (primary) hypertension: Secondary | ICD-10-CM | POA: Diagnosis not present

## 2016-05-04 DIAGNOSIS — C50912 Malignant neoplasm of unspecified site of left female breast: Secondary | ICD-10-CM | POA: Diagnosis not present

## 2016-05-04 DIAGNOSIS — C50919 Malignant neoplasm of unspecified site of unspecified female breast: Secondary | ICD-10-CM

## 2016-05-04 DIAGNOSIS — C7951 Secondary malignant neoplasm of bone: Secondary | ICD-10-CM

## 2016-05-04 DIAGNOSIS — D638 Anemia in other chronic diseases classified elsewhere: Secondary | ICD-10-CM | POA: Diagnosis not present

## 2016-05-04 DIAGNOSIS — E119 Type 2 diabetes mellitus without complications: Secondary | ICD-10-CM | POA: Diagnosis not present

## 2016-05-04 DIAGNOSIS — D630 Anemia in neoplastic disease: Secondary | ICD-10-CM

## 2016-05-04 DIAGNOSIS — N289 Disorder of kidney and ureter, unspecified: Secondary | ICD-10-CM

## 2016-05-04 DIAGNOSIS — D649 Anemia, unspecified: Secondary | ICD-10-CM | POA: Diagnosis not present

## 2016-05-04 DIAGNOSIS — L271 Localized skin eruption due to drugs and medicaments taken internally: Secondary | ICD-10-CM | POA: Diagnosis not present

## 2016-05-04 LAB — COMPREHENSIVE METABOLIC PANEL
ALT: 8 U/L (ref 0–55)
ANION GAP: 12 meq/L — AB (ref 3–11)
AST: 17 U/L (ref 5–34)
Albumin: 3.7 g/dL (ref 3.5–5.0)
Alkaline Phosphatase: 104 U/L (ref 40–150)
BILIRUBIN TOTAL: 0.39 mg/dL (ref 0.20–1.20)
BUN: 26.2 mg/dL — ABNORMAL HIGH (ref 7.0–26.0)
CALCIUM: 7.9 mg/dL — AB (ref 8.4–10.4)
CHLORIDE: 108 meq/L (ref 98–109)
CO2: 21 meq/L — AB (ref 22–29)
CREATININE: 1.4 mg/dL — AB (ref 0.6–1.1)
EGFR: 40 mL/min/{1.73_m2} — AB (ref >90)
Glucose: 73 mg/dl (ref 70–140)
Potassium: 4.3 mEq/L (ref 3.5–5.1)
Sodium: 142 mEq/L (ref 136–145)
Total Protein: 6.6 g/dL (ref 6.4–8.3)

## 2016-05-04 LAB — CBC WITH DIFFERENTIAL/PLATELET
BASO%: 0 % (ref 0.0–2.0)
BASOS ABS: 0 10*3/uL (ref 0.0–0.1)
EOS ABS: 0.2 10*3/uL (ref 0.0–0.5)
EOS%: 2.8 % (ref 0.0–7.0)
HEMATOCRIT: 30.5 % — AB (ref 34.8–46.6)
HGB: 10 g/dL — ABNORMAL LOW (ref 11.6–15.9)
LYMPH#: 0.4 10*3/uL — AB (ref 0.9–3.3)
LYMPH%: 7.8 % — ABNORMAL LOW (ref 14.0–49.7)
MCH: 34 pg (ref 25.1–34.0)
MCHC: 32.8 g/dL (ref 31.5–36.0)
MCV: 103.7 fL — AB (ref 79.5–101.0)
MONO#: 0.5 10*3/uL (ref 0.1–0.9)
MONO%: 9.3 % (ref 0.0–14.0)
NEUT%: 80.1 % — ABNORMAL HIGH (ref 38.4–76.8)
NEUTROS ABS: 4.5 10*3/uL (ref 1.5–6.5)
PLATELETS: 118 10*3/uL — AB (ref 145–400)
RBC: 2.94 10*6/uL — AB (ref 3.70–5.45)
RDW: 19.1 % — AB (ref 11.2–14.5)
WBC: 5.7 10*3/uL (ref 3.9–10.3)

## 2016-05-04 MED ORDER — DENOSUMAB 120 MG/1.7ML ~~LOC~~ SOLN
120.0000 mg | Freq: Once | SUBCUTANEOUS | Status: AC
  Administered 2016-05-04: 120 mg via SUBCUTANEOUS
  Filled 2016-05-04: qty 1.7

## 2016-05-04 MED ORDER — MORPHINE SULFATE ER 15 MG PO TBCR: 15.0000 mg | EXTENDED_RELEASE_TABLET | Freq: Two times a day (BID) | ORAL | 0 refills | Status: DC

## 2016-05-04 MED ORDER — CAPECITABINE 500 MG PO TABS: ORAL_TABLET | ORAL | 0 refills | Status: DC

## 2016-05-04 MED FILL — ALPRAZolam 0.5 MG TABS: 0.5 | 30 days supply | Qty: 90 | Fill #0

## 2016-05-04 MED FILL — CAPECITABINE 500 MG TABLET: 500 | 21 days supply | Qty: 56 | Fill #0

## 2016-05-04 NOTE — Addendum Note (Signed)
Addended by: San Morelle on: 05/04/2016 12:01 PM   Modules accepted: Orders

## 2016-05-04 NOTE — Progress Notes (Signed)
[facility] OFFICE PROGRESS NOTE   Diagnosis: Breast cancer  INTERVAL HISTORY:   [name] returns as scheduled. She continues Xeloda. She began the most recent week on 05/01/2016. No mouth sores or diarrhea. She has developed erythema, superficial skin breakdown, and mild tenderness at the palms and soles. She has noted improvement in sleeping since beginning Xeloda. Stable bone pain. Good appetite.  Objective:  Vital signs in last 24 hours:  Blood pressure (!) 127/50, pulse 83, temperature 97.8 F (36.6 C), temperature source Oral, resp. rate 17, height '5\' 3"'  (1.6 m), weight 214 lb 8 oz (97.3 kg), SpO2 100 %.    HEENT: No thrush or ulcers Resp: Lungs clear bilaterally Cardio: Regular rate and rhythm GI: No hepatosplenomegaly, nontender Vascular: No leg edema  Skin: Mild erythema at the distal fingers with a few areas of superficial desquamation, dry desquamation at the soles with mild erythema    Lab Results:  Lab Results  Component Value Date   WBC 5.7 05/04/2016   HGB 10.0 (L) 05/04/2016   HCT 30.5 (L) 05/04/2016   MCV 103.7 (H) 05/04/2016   PLT 118 (L) 05/04/2016   NEUTROABS 4.5 05/04/2016  CA-27-29 on 04/07/2016-43.1  Medications: I have reviewed the patient's current medications.  Assessment/Plan: 1. 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 ER positive, PR positive, HER-2 negative She completed 4 cycles of adjuvant AC chemotherapy June 15 through September 03, 2008, and then completed the left chest wall radiation. She began Arimidex following an office visit on November 11, 2008.  Bone scan 06/01/2013 suggestive of thoracic spine metastases, thoracic MRI 06/27/2011 consistent with multiple bone metastases involving the thoracolumbar spine   PET scan 07/09/2013 with multiple hypermetabolic bone lesions and  hypermetabolic mediastinal nodes.   Left iliac lesion biopsy 07/26/2013. Pathology showed metastatic carcinoma, ER positive, PR positive, HER-2 negative   Initiation of Faslodex 08/10/2013.  Xgeva every 3 months initiated 07/27/2013.  Restaging PET scan 01/23/2014 with no residual hypermetabolic mediastinal disease; marked improvement in the metastatic bone disease.  Restaging PET scan 02/17/2015 with recurrence of skeletal metastasis. New hypermetabolic lesions within the pelvis and ribs. No hypermetabolic lymphadenopathy.  Continuation of Faslodex; initiation of Ibrance 03/05/2015  Cycle 2 Ibrance 04/09/2015-dose reduced to 100 mg daily  Cycle 3 Ibrance 05/08/2015  Cycle 4 Ibrance 06/11/2015  Cycle 5 Ibrance 07/15/2015  PET scan 08/12/2015-increase in size and increased metabolic activity associated with bone metastases, no new lesions  Ibrance/Faslodex discontinued  Tamoxifen started 08/07/2015  PET scan 02/04/2016 with new hypermetabolic liver lesions and numerous new skeletal lesions.  Xeloda, 7 days on/7 days off initiated February 2018  Xeloda dose reduced secondary to hand/foot syndrome beginning on 05/04/2016 2. Left chest subcutaneous mass noted on exam October 04, 2008, status post biopsy by Dr. Brantley Stage with a benign pathology. 3. History of delayed healing of the left mastectomy incision. 4. Rheumatoid arthritis. 5. Diabetes. 6. Hypercholesterolemia. 7. Hypertension. 8. Family history of breast cancer. She has been evaluated at the genetic screening clinic. 9. 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 10. 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 Hand/foot  syndrome secondary to Xeloda-Xeloda dose reduced 05/05/2015   Disposition:  Her overall status appears unchanged. There is no clinical evidence for progression of the metastatic breast cancer. She will continue the current narcotic regimen. We refilled the MS Contin today. [name] will resume xgeva for fracture prophylaxis. She has developed hand/foot syndrome secondary to Xeloda. I recommended she discontinue folic acid. The Xeloda will be dose reduced to 1000 g twice daily beginning today.  She will begin another week of Xeloda on 05/15/2016 if the hand and feet have improved. If not she will contact us.  She will be scheduled for a restaging MRI of the liver prior to an office visit on 05/27/2016.  25 minutes were spent with the patient today. The majority of the time was used for counseling and coordination of care.  Betsy Coder, MD  05/04/2016  10:51 AM

## 2016-05-04 NOTE — Telephone Encounter (Signed)
Patient called stating Mr. Carlyon Shadow wanted her to give him a call after her visit with her cancer doctor.  CB#[phone].  Thank you.

## 2016-05-04 NOTE — Telephone Encounter (Signed)
XGEVA injection added for today per desk nurse, Clarise Cruz. 05/04/16

## 2016-05-04 NOTE — Telephone Encounter (Signed)
Appointments scheduled per 05/04/16 los. Patient was given a copy of the AVS report and appointment schedule per 05/04/16 los. °

## 2016-05-05 ENCOUNTER — Telehealth: Payer: Self-pay | Admitting: Rheumatology

## 2016-05-05 LAB — CANCER ANTIGEN 27.29: CA 27.29: 31.1 U/mL (ref 0.0–38.6)

## 2016-05-05 NOTE — Telephone Encounter (Signed)
Attempted to contact the patient and left message for patient to call the office.  

## 2016-05-05 NOTE — Telephone Encounter (Signed)
Patient returned Andrea's phone call and is requesting a call back. Thanks!

## 2016-05-06 ENCOUNTER — Other Ambulatory Visit: Payer: Self-pay | Admitting: Rheumatology

## 2016-05-06 NOTE — Telephone Encounter (Signed)
Patient advised okay to come off of the Folic Acid. Patient verbalized understanding.

## 2016-05-06 NOTE — Telephone Encounter (Signed)
See previous phone message. 

## 2016-05-06 NOTE — Telephone Encounter (Signed)
Patient states her lab work was performed at the Cancer center. Patient states Dr. Benay Spice wants her to discontinue Folic Acid. Patient is not currently on MTX, she states Dr. Benay Spice is concerned the Folic Acid is interfering with other medications and labs. Patient states she is also having a MRI of the liver on 05/18/16. Patient states her Xeloda was reduced from 3 tablets twice a day to 2 tablets twice a day.

## 2016-05-06 NOTE — Telephone Encounter (Signed)
OK TO COME OFF OF FOLIC ACID PER HEME/ONC DOCTOR'S ADVICE.

## 2016-05-07 MED FILL — MORPHINE SULF ER 15 MG TAB: 15 | 30 days supply | Qty: 60 | Fill #0

## 2016-05-14 ENCOUNTER — Other Ambulatory Visit: Payer: Self-pay | Admitting: *Deleted

## 2016-05-14 DIAGNOSIS — C7951 Secondary malignant neoplasm of bone: Principal | ICD-10-CM

## 2016-05-14 DIAGNOSIS — C50919 Malignant neoplasm of unspecified site of unspecified female breast: Secondary | ICD-10-CM

## 2016-05-14 MED ORDER — OXYCODONE-ACETAMINOPHEN 10-325 MG PO TABS: 1.0000 | ORAL_TABLET | ORAL | 0 refills | Status: DC | PRN

## 2016-05-14 MED FILL — OXYCODONE-ACETAMINOPHEN 10-: 10-325 | 30 days supply | Qty: 180 | Fill #0

## 2016-05-14 NOTE — Telephone Encounter (Signed)
Call from pt requesting refill of Oxycodone. Script will be left up front for pick up.

## 2016-05-18 ENCOUNTER — Ambulatory Visit
Admission: RE | Admit: 2016-05-18 | Discharge: 2016-05-18 | Disposition: A | Discharge status: Home / Self Care | Payer: Medicare Other | Source: Ambulatory Visit | Attending: Oncology | Admitting: Oncology

## 2016-05-18 DIAGNOSIS — C7951 Secondary malignant neoplasm of bone: Principal | ICD-10-CM

## 2016-05-18 DIAGNOSIS — C50919 Malignant neoplasm of unspecified site of unspecified female breast: Secondary | ICD-10-CM

## 2016-05-18 MED ORDER — GADOBENATE DIMEGLUMINE 529 MG/ML IV SOLN
10.0000 mL | Freq: Once | INTRAVENOUS | Status: AC | PRN
  Administered 2016-05-18: 10 mL via INTRAVENOUS

## 2016-05-27 ENCOUNTER — Other Ambulatory Visit (HOSPITAL_BASED_OUTPATIENT_CLINIC_OR_DEPARTMENT_OTHER): Payer: Medicare Other

## 2016-05-27 ENCOUNTER — Telehealth: Payer: Self-pay | Admitting: Nurse Practitioner

## 2016-05-27 ENCOUNTER — Ambulatory Visit (HOSPITAL_BASED_OUTPATIENT_CLINIC_OR_DEPARTMENT_OTHER): Payer: Medicare Other | Admitting: Nurse Practitioner

## 2016-05-27 VITALS — BP 127/72 | HR 90 | Temp 99.2°F | Resp 18 | Ht 63.0 in | Wt 212.9 lb

## 2016-05-27 DIAGNOSIS — M069 Rheumatoid arthritis, unspecified: Secondary | ICD-10-CM

## 2016-05-27 DIAGNOSIS — D649 Anemia, unspecified: Secondary | ICD-10-CM

## 2016-05-27 DIAGNOSIS — N289 Disorder of kidney and ureter, unspecified: Secondary | ICD-10-CM

## 2016-05-27 DIAGNOSIS — E119 Type 2 diabetes mellitus without complications: Secondary | ICD-10-CM

## 2016-05-27 DIAGNOSIS — C50919 Malignant neoplasm of unspecified site of unspecified female breast: Secondary | ICD-10-CM

## 2016-05-27 DIAGNOSIS — C7951 Secondary malignant neoplasm of bone: Secondary | ICD-10-CM

## 2016-05-27 DIAGNOSIS — D638 Anemia in other chronic diseases classified elsewhere: Secondary | ICD-10-CM

## 2016-05-27 DIAGNOSIS — M545 Low back pain: Secondary | ICD-10-CM

## 2016-05-27 DIAGNOSIS — M25551 Pain in right hip: Secondary | ICD-10-CM | POA: Diagnosis not present

## 2016-05-27 DIAGNOSIS — M25552 Pain in left hip: Secondary | ICD-10-CM | POA: Diagnosis not present

## 2016-05-27 DIAGNOSIS — I10 Essential (primary) hypertension: Secondary | ICD-10-CM

## 2016-05-27 DIAGNOSIS — C787 Secondary malignant neoplasm of liver and intrahepatic bile duct: Secondary | ICD-10-CM

## 2016-05-27 DIAGNOSIS — D6481 Anemia due to antineoplastic chemotherapy: Secondary | ICD-10-CM | POA: Diagnosis not present

## 2016-05-27 DIAGNOSIS — R110 Nausea: Secondary | ICD-10-CM

## 2016-05-27 DIAGNOSIS — D630 Anemia in neoplastic disease: Secondary | ICD-10-CM

## 2016-05-27 LAB — CBC WITH DIFFERENTIAL/PLATELET
BASO%: 0.2 % (ref 0.0–2.0)
BASOS ABS: 0 10*3/uL (ref 0.0–0.1)
EOS%: 4 % (ref 0.0–7.0)
Eosinophils Absolute: 0.2 10*3/uL (ref 0.0–0.5)
HCT: 31.6 % — ABNORMAL LOW (ref 34.8–46.6)
HEMOGLOBIN: 10.7 g/dL — AB (ref 11.6–15.9)
LYMPH%: 8.8 % — AB (ref 14.0–49.7)
MCH: 36.1 pg — ABNORMAL HIGH (ref 25.1–34.0)
MCHC: 33.7 g/dL (ref 31.5–36.0)
MCV: 107.2 fL — ABNORMAL HIGH (ref 79.5–101.0)
MONO#: 0.6 10*3/uL (ref 0.1–0.9)
MONO%: 11.1 % (ref 0.0–14.0)
NEUT#: 4.4 10*3/uL (ref 1.5–6.5)
NEUT%: 75.9 % (ref 38.4–76.8)
Platelets: 157 10*3/uL (ref 145–400)
RBC: 2.95 10*6/uL — AB (ref 3.70–5.45)
RDW: 19 % — AB (ref 11.2–14.5)
WBC: 5.8 10*3/uL (ref 3.9–10.3)
lymph#: 0.5 10*3/uL — ABNORMAL LOW (ref 0.9–3.3)

## 2016-05-27 LAB — COMPREHENSIVE METABOLIC PANEL
ALBUMIN: 3.7 g/dL (ref 3.5–5.0)
ALT: 11 U/L (ref 0–55)
AST: 19 U/L (ref 5–34)
Alkaline Phosphatase: 112 U/L (ref 40–150)
Anion Gap: 12 mEq/L — ABNORMAL HIGH (ref 3–11)
BUN: 27 mg/dL — AB (ref 7.0–26.0)
CHLORIDE: 107 meq/L (ref 98–109)
CO2: 23 meq/L (ref 22–29)
Calcium: 8.6 mg/dL (ref 8.4–10.4)
Creatinine: 1.4 mg/dL — ABNORMAL HIGH (ref 0.6–1.1)
EGFR: 38 mL/min/{1.73_m2} — ABNORMAL LOW (ref >90)
GLUCOSE: 99 mg/dL (ref 70–140)
POTASSIUM: 3.9 meq/L (ref 3.5–5.1)
SODIUM: 142 meq/L (ref 136–145)
Total Bilirubin: 0.31 mg/dL (ref 0.20–1.20)
Total Protein: 6.9 g/dL (ref 6.4–8.3)

## 2016-05-27 NOTE — Progress Notes (Addendum)
[facility] OFFICE PROGRESS NOTE   Diagnosis:  Breast cancer  INTERVAL HISTORY:   [name] returns as scheduled. She continues Xeloda. She has intermittent mild nausea, occasional vomiting. No mouth sores. No diarrhea. Feet are "better". She continues to have lower back and bilateral hip pain. She takes MS Contin 15 mg every 12 hours and Percocet every 4 hours, typically around the clock.  Objective:  Vital signs in last 24 hours:  Blood pressure 127/72, pulse 90, temperature 99.2 F (37.3 C), temperature source Oral, resp. rate 18, height 5' 3" (1.6 m), weight 212 lb 14.4 oz (96.6 kg), SpO2 100 %.    HEENT: No thrush or ulcers. Resp: Lungs clear bilaterally. Cardio: Regular rate and rhythm. GI: Abdomen soft and nontender. No hepatomegaly. Vascular: No leg edema. Skin: Palms with very mild erythema. Soles with mild erythema and a few areas of dry desquamation.    Lab Results:  Lab Results  Component Value Date   WBC 5.8 05/27/2016   HGB 10.7 (L) 05/27/2016   HCT 31.6 (L) 05/27/2016   MCV 107.2 (H) 05/27/2016   PLT 157 05/27/2016   NEUTROABS 4.4 05/27/2016     Medications: I have reviewed the patient's current medications.  Assessment/Plan: 1. 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 ER positive, PR positive, HER-2 negative She completed 4 cycles of adjuvant AC chemotherapy June 15 through September 03, 2008, and then completed the left chest wall radiation. She began Arimidex following an office visit on November 11, 2008.  Bone scan 06/01/2013 suggestive of thoracic spine metastases, thoracic MRI 06/27/2011 consistent with multiple bone metastases involving the thoracolumbar spine   PET scan 07/09/2013 with multiple hypermetabolic bone lesions and hypermetabolic mediastinal nodes.   Left iliac lesion biopsy 07/26/2013.  Pathology showed metastatic carcinoma, ER positive, PR positive, HER-2 negative   Initiation of Faslodex 08/10/2013.  Xgeva every 3 months initiated 07/27/2013.  Restaging PET scan 01/23/2014 with no residual hypermetabolic mediastinal disease; marked improvement in the metastatic bone disease.  Restaging PET scan 02/17/2015 with recurrence of skeletal metastasis. New hypermetabolic lesions within the pelvis and ribs. No hypermetabolic lymphadenopathy.  Continuation of Faslodex; initiation of Ibrance 03/05/2015  Cycle 2 Ibrance 04/09/2015-dose reduced to 100 mg daily  Cycle 3 Ibrance 05/08/2015  Cycle 4 Ibrance 06/11/2015  Cycle 5 Ibrance 07/15/2015  PET scan 08/12/2015-increase in size and increased metabolic activity associated with bone metastases, no new lesions  Ibrance/Faslodex discontinued  Tamoxifen started 08/07/2015  PET scan 02/04/2016 with new hypermetabolic liver lesions and numerous new skeletal lesions.  Xeloda, 7 days on/7 days off initiated February 2018  Xeloda dose reduced secondary to hand/foot syndrome beginning on 05/04/2016  Restaging MRI liver 05/18/2016-2 dominant hepatic metastases measuring up to 13 mm favored to be improved.  Xeloda continued 2. Left chest subcutaneous mass noted on exam October 04, 2008, status post biopsy by Dr. Brantley Stage with a benign pathology. 3. History of delayed healing of the left mastectomy incision. 4. Rheumatoid arthritis. 5. Diabetes. 6. Hypercholesterolemia. 7. Hypertension. 8. Family history of breast cancer. She has been evaluated at the genetic screening clinic. 9. 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 10. 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 Anemia secondary to metastatic breast cancer, chronic disease, renal  insufficiency, and chemotherapy 12. Hand/foot syndrome secondary to Xeloda-Xeloda dose reduced 05/05/2015; improved 05/27/2016    Disposition: [name] appears stable. The recent MRI shows improvement in the liver metastases. Plan to continue Xeloda at the current dose/schedule.  She continues to have back and hip pain. She understands the pain is likely multifactorial secondary to cancer and arthritis. She will continue MS Contin and Percocet as she is currently taking.  She will return for labs and a follow-up visit in one month. She will contact the office in the interim with any problems.  Patient seen with Dr. Benay Spice.    [name] ANP/GNP-BC   05/27/2016  11:40 AM  This was a shared visit with [name]. The restaging evaluation reveals improvement in the metastatic liver lesions. She will continue Xeloda. The hand/foot symptoms have improved.  She will continue the current narcotic regimen for pain.  [name], M.D.

## 2016-05-27 NOTE — Telephone Encounter (Signed)
Gave patient AVS and calender per 5/10 los.  

## 2016-05-28 LAB — CANCER ANTIGEN 27.29: CA 27.29: 34.3 U/mL (ref 0.0–38.6)

## 2016-06-02 ENCOUNTER — Other Ambulatory Visit: Payer: Self-pay | Admitting: Oncology

## 2016-06-02 DIAGNOSIS — C7951 Secondary malignant neoplasm of bone: Principal | ICD-10-CM

## 2016-06-02 DIAGNOSIS — C50919 Malignant neoplasm of unspecified site of unspecified female breast: Secondary | ICD-10-CM

## 2016-06-02 NOTE — Telephone Encounter (Signed)
Message from pt requesting MS Contin refill. She will pick up on 5/18. Pt reports she is no longer driving since beginning this medication.

## 2016-06-03 ENCOUNTER — Other Ambulatory Visit: Payer: Self-pay | Admitting: Oncology

## 2016-06-03 ENCOUNTER — Other Ambulatory Visit: Payer: Self-pay | Admitting: *Deleted

## 2016-06-03 DIAGNOSIS — C7951 Secondary malignant neoplasm of bone: Principal | ICD-10-CM

## 2016-06-03 DIAGNOSIS — C50919 Malignant neoplasm of unspecified site of unspecified female breast: Secondary | ICD-10-CM

## 2016-06-03 MED ORDER — MORPHINE SULFATE ER 15 MG PO TBCR: 15.0000 mg | EXTENDED_RELEASE_TABLET | Freq: Two times a day (BID) | ORAL | 0 refills | Status: DC

## 2016-06-03 MED FILL — XELODA 500 MG TABLET: 500 | 21 days supply | Qty: 56 | Fill #0

## 2016-06-03 NOTE — Telephone Encounter (Signed)
Returned call to pt, script is ready for pick up.

## 2016-06-04 MED FILL — MORPHINE SULF ER 15 MG TAB: 15 | 30 days supply | Qty: 60 | Fill #0

## 2016-06-08 MED FILL — ALPRAZolam 0.5 MG TABS: 0.5 | 23 days supply | Qty: 90 | Fill #0

## 2016-06-12 ENCOUNTER — Other Ambulatory Visit: Payer: Self-pay | Admitting: Rheumatology

## 2016-06-15 ENCOUNTER — Telehealth: Payer: Self-pay

## 2016-06-15 DIAGNOSIS — C50919 Malignant neoplasm of unspecified site of unspecified female breast: Secondary | ICD-10-CM

## 2016-06-15 DIAGNOSIS — C7951 Secondary malignant neoplasm of bone: Principal | ICD-10-CM

## 2016-06-15 MED ORDER — ALPRAZOLAM 0.5 MG PO TABS: ORAL_TABLET | ORAL | 0 refills | Status: DC

## 2016-06-15 MED ORDER — OXYCODONE-ACETAMINOPHEN 10-325 MG PO TABS: 1.0000 | ORAL_TABLET | ORAL | 0 refills | Status: DC | PRN

## 2016-06-15 MED FILL — OXYCODONE-ACETAMINOPHEN 10-: 10-325 | 30 days supply | Qty: 180 | Fill #0

## 2016-06-15 NOTE — Telephone Encounter (Addendum)
Last Visit: 04/28/16 Next Visit: 06/29/16 Labs: 05/27/16 Stable PLQ Eye Exam 01/15/15 WNL  Left patient a message regarding need for updated PLQ eye exam.  Okay to refill 30 day supply PLQ and folic acid?

## 2016-06-15 NOTE — Telephone Encounter (Signed)
Pt called for xanax refill. Recalled it in to [facility], with note it looks like it was called in on 5/21 by Dr Benay Spice.   She also requesting a percocet refill.  Please call pt when ready for pickup

## 2016-06-15 NOTE — Addendum Note (Signed)
Addended by: Brien Few on: 06/15/2016 09:26 AM   Modules accepted: Orders

## 2016-06-15 NOTE — Telephone Encounter (Signed)
Notified pt that prescription is ready.

## 2016-06-17 ENCOUNTER — Telehealth (INDEPENDENT_AMBULATORY_CARE_PROVIDER_SITE_OTHER): Payer: Self-pay | Admitting: *Deleted

## 2016-06-17 NOTE — Telephone Encounter (Signed)
Patient called in this morning in regards to wanting to let Mr. Carlyon Shadow that she is having her eye exam on 07-02-2016. Thank you

## 2016-06-22 ENCOUNTER — Telehealth: Payer: Self-pay | Admitting: *Deleted

## 2016-06-22 NOTE — Telephone Encounter (Signed)
Message from pt requesting jury duty letter. Returned call, she reports jury duty has been rescheduled to 10/31. She will request a letter then if needed.

## 2016-06-29 ENCOUNTER — Other Ambulatory Visit: Payer: Self-pay | Admitting: Oncology

## 2016-06-29 ENCOUNTER — Ambulatory Visit: Payer: Medicare Other | Admitting: Rheumatology

## 2016-06-29 ENCOUNTER — Telehealth: Payer: Self-pay | Admitting: Oncology

## 2016-06-29 ENCOUNTER — Ambulatory Visit (HOSPITAL_BASED_OUTPATIENT_CLINIC_OR_DEPARTMENT_OTHER): Payer: Medicare Other | Admitting: Oncology

## 2016-06-29 ENCOUNTER — Other Ambulatory Visit (HOSPITAL_BASED_OUTPATIENT_CLINIC_OR_DEPARTMENT_OTHER): Payer: Medicare Other

## 2016-06-29 VITALS — BP 142/62 | HR 81 | Temp 98.4°F | Resp 18 | Ht 63.0 in | Wt 215.3 lb

## 2016-06-29 DIAGNOSIS — C50919 Malignant neoplasm of unspecified site of unspecified female breast: Secondary | ICD-10-CM

## 2016-06-29 DIAGNOSIS — I10 Essential (primary) hypertension: Secondary | ICD-10-CM | POA: Diagnosis not present

## 2016-06-29 DIAGNOSIS — D630 Anemia in neoplastic disease: Secondary | ICD-10-CM | POA: Diagnosis not present

## 2016-06-29 DIAGNOSIS — D638 Anemia in other chronic diseases classified elsewhere: Secondary | ICD-10-CM

## 2016-06-29 DIAGNOSIS — C7951 Secondary malignant neoplasm of bone: Secondary | ICD-10-CM

## 2016-06-29 DIAGNOSIS — D6481 Anemia due to antineoplastic chemotherapy: Secondary | ICD-10-CM

## 2016-06-29 DIAGNOSIS — C787 Secondary malignant neoplasm of liver and intrahepatic bile duct: Secondary | ICD-10-CM

## 2016-06-29 DIAGNOSIS — N289 Disorder of kidney and ureter, unspecified: Secondary | ICD-10-CM

## 2016-06-29 DIAGNOSIS — M549 Dorsalgia, unspecified: Secondary | ICD-10-CM

## 2016-06-29 DIAGNOSIS — M069 Rheumatoid arthritis, unspecified: Secondary | ICD-10-CM | POA: Diagnosis not present

## 2016-06-29 LAB — CBC WITH DIFFERENTIAL/PLATELET
BASO%: 0.1 % (ref 0.0–2.0)
BASOS ABS: 0 10*3/uL (ref 0.0–0.1)
EOS ABS: 0.1 10*3/uL (ref 0.0–0.5)
EOS%: 1.7 % (ref 0.0–7.0)
HCT: 33.3 % — ABNORMAL LOW (ref 34.8–46.6)
HEMOGLOBIN: 11.2 g/dL — AB (ref 11.6–15.9)
LYMPH%: 7.6 % — ABNORMAL LOW (ref 14.0–49.7)
MCH: 36.1 pg — AB (ref 25.1–34.0)
MCHC: 33.6 g/dL (ref 31.5–36.0)
MCV: 107.6 fL — AB (ref 79.5–101.0)
MONO#: 0.4 10*3/uL (ref 0.1–0.9)
MONO%: 8.2 % (ref 0.0–14.0)
NEUT#: 4.1 10*3/uL (ref 1.5–6.5)
NEUT%: 82.4 % — AB (ref 38.4–76.8)
Platelets: 146 10*3/uL (ref 145–400)
RBC: 3.1 10*6/uL — ABNORMAL LOW (ref 3.70–5.45)
RDW: 14.8 % — AB (ref 11.2–14.5)
WBC: 4.9 10*3/uL (ref 3.9–10.3)
lymph#: 0.4 10*3/uL — ABNORMAL LOW (ref 0.9–3.3)

## 2016-06-29 LAB — COMPREHENSIVE METABOLIC PANEL
ALBUMIN: 3.6 g/dL (ref 3.5–5.0)
ALK PHOS: 109 U/L (ref 40–150)
ALT: 7 U/L (ref 0–55)
AST: 16 U/L (ref 5–34)
Anion Gap: 9 mEq/L (ref 3–11)
BUN: 25 mg/dL (ref 7.0–26.0)
CALCIUM: 7.9 mg/dL — AB (ref 8.4–10.4)
CO2: 23 mEq/L (ref 22–29)
Chloride: 109 mEq/L (ref 98–109)
Creatinine: 1.3 mg/dL — ABNORMAL HIGH (ref 0.6–1.1)
EGFR: 42 mL/min/{1.73_m2} — AB (ref >90)
Glucose: 93 mg/dl (ref 70–140)
POTASSIUM: 4.9 meq/L (ref 3.5–5.1)
SODIUM: 141 meq/L (ref 136–145)
Total Bilirubin: 0.39 mg/dL (ref 0.20–1.20)
Total Protein: 6.8 g/dL (ref 6.4–8.3)

## 2016-06-29 MED FILL — XELODA 500 MG TABLET: 500 | 21 days supply | Qty: 56 | Fill #0

## 2016-06-29 NOTE — Progress Notes (Signed)
[facility] OFFICE PROGRESS NOTE   Diagnosis: Breast cancer  INTERVAL HISTORY:   [name] returns as scheduled. She is completing a week of Xeloda. No mouth sores or diarrhea. Mild discomfort at the fingertips. Less skin breakdown over the feet. She continues to have back pain. The pain radiates to the hip area bilaterally.  Objective:  Vital signs in last 24 hours:  Blood pressure (!) 142/62, pulse 81, temperature 98.4 F (36.9 C), temperature source Oral, resp. rate 18, height '5\' 3"'  (1.6 m), weight 215 lb 4.8 oz (97.7 kg), SpO2 98 %.    HEENT: No thrush or ulcers Resp: Lungs clear bilaterally Cardio: Regular rate and rhythm GI: No hepatosplenomegaly Vascular: No leg edema  Skin: Mild erythema at the distal fingertips bilaterally, dryness without erythema at the feet. Status post left mastectomy. No evidence for chest wall tumor recurrence.     Lab Results:  Lab Results  Component Value Date   WBC 4.9 06/29/2016   HGB 11.2 (L) 06/29/2016   HCT 33.3 (L) 06/29/2016   MCV 107.6 (H) 06/29/2016   PLT 146 06/29/2016   NEUTROABS 4.1 06/29/2016    CMP     Component Value Date/Time   NA 141 06/29/2016 1144   K 4.9 06/29/2016 1144   CL 103 03/25/2016 1600   CO2 23 06/29/2016 1144   GLUCOSE 93 06/29/2016 1144   BUN 25.0 06/29/2016 1144   CREATININE 1.3 (H) 06/29/2016 1144   CALCIUM 7.9 (L) 06/29/2016 1144   PROT 6.8 06/29/2016 1144   ALBUMIN 3.6 06/29/2016 1144   AST 16 06/29/2016 1144   ALT 7 06/29/2016 1144   ALKPHOS 109 06/29/2016 1144   BILITOT 0.39 06/29/2016 1144   GFRNONAA 39 (L) 03/25/2016 1600   GFRAA 45 (L) 03/25/2016 1600   CA 27-29 on 05/27/2016-34.3  Medications: I have reviewed the patient's current medications.  Assessment/Plan: 1. 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 ER positive, PR positive,  HER-2 negative She completed 4 cycles of adjuvant AC chemotherapy June 15 through September 03, 2008, and then completed the left chest wall radiation. She began Arimidex following an office visit on November 11, 2008.  Bone scan 06/01/2013 suggestive of thoracic spine metastases, thoracic MRI 06/27/2011 consistent with multiple bone metastases involving the thoracolumbar spine   PET scan 07/09/2013 with multiple hypermetabolic bone lesions and hypermetabolic mediastinal nodes.   Left iliac lesion biopsy 07/26/2013. Pathology showed metastatic carcinoma, ER positive, PR positive, HER-2 negative   Initiation of Faslodex 08/10/2013.  Xgeva every 3 months initiated 07/27/2013.  Restaging PET scan 01/23/2014 with no residual hypermetabolic mediastinal disease; marked improvement in the metastatic bone disease.  Restaging PET scan 02/17/2015 with recurrence of skeletal metastasis. New hypermetabolic lesions within the pelvis and ribs. No hypermetabolic lymphadenopathy.  Continuation of Faslodex; initiation of Ibrance 03/05/2015  Cycle 2 Ibrance 04/09/2015-dose reduced to 100 mg daily  Cycle 3 Ibrance 05/08/2015  Cycle 4 Ibrance 06/11/2015  Cycle 5 Ibrance 07/15/2015  PET scan 08/12/2015-increase in size and increased metabolic activity associated with bone metastases, no new lesions  Ibrance/Faslodex discontinued  Tamoxifen started 08/07/2015  PET scan 02/04/2016 with new hypermetabolic liver lesions and numerous new skeletal lesions.  Xeloda, 7 days on/7 days off initiated February 2018  Xeloda dose reduced secondary to hand/foot syndrome beginning on 05/04/2016  Restaging MRI liver 05/18/2016-2 dominant hepatic metastases measuring up to 13  mm favored to be improved.  Xeloda continued 2. Left chest subcutaneous mass noted on exam October 04, 2008, status post biopsy by Dr. Brantley Stage with a benign pathology. 3. History of delayed healing of the left mastectomy  incision. 4. Rheumatoid arthritis. 5. Diabetes. 6. Hypercholesterolemia. 7. Hypertension. 8. Family history of breast cancer. She has been evaluated at the genetic screening clinic. 9. 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 10. 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 Hand/foot syndrome secondary to Xeloda-Xeloda dose reduced 05/05/2015; improved 05/27/2016  Disposition:  Her overall status appears unchanged. She will continue Xeloda on a 7 day on/7 day off schedule. She will return for an office and lab visit on 07/28/2016. 15 minutes were spent with the patient today. The majority of the time was used for counseling and coordination of care.  Donneta Romberg, MD  06/29/2016  1:03 PM

## 2016-06-29 NOTE — Telephone Encounter (Signed)
Scheduled appt per 6/12 los - Gave patient AVS and calender per LOS.  

## 2016-06-30 LAB — CANCER ANTIGEN 27.29: CAN 27.29: 23.7 U/mL (ref 0.0–38.6)

## 2016-07-02 ENCOUNTER — Telehealth: Payer: Self-pay

## 2016-07-02 LAB — HM DIABETES EYE EXAM

## 2016-07-02 NOTE — Telephone Encounter (Signed)
Called and left voicemail advising patient to return call concerning appointment.

## 2016-07-05 ENCOUNTER — Telehealth: Payer: Self-pay | Admitting: Rheumatology

## 2016-07-05 NOTE — Telephone Encounter (Signed)
Patient called stating that she had her labs done last week at the cancer center and had her eye exam on Friday.  Thank you.  FYI

## 2016-07-13 ENCOUNTER — Other Ambulatory Visit: Payer: Self-pay | Admitting: Oncology

## 2016-07-13 DIAGNOSIS — C7951 Secondary malignant neoplasm of bone: Principal | ICD-10-CM

## 2016-07-13 DIAGNOSIS — C50919 Malignant neoplasm of unspecified site of unspecified female breast: Secondary | ICD-10-CM

## 2016-07-14 ENCOUNTER — Other Ambulatory Visit: Payer: Self-pay | Admitting: *Deleted

## 2016-07-14 DIAGNOSIS — C7951 Secondary malignant neoplasm of bone: Principal | ICD-10-CM

## 2016-07-14 DIAGNOSIS — C50919 Malignant neoplasm of unspecified site of unspecified female breast: Secondary | ICD-10-CM

## 2016-07-14 MED ORDER — MORPHINE SULFATE ER 15 MG PO TBCR: 15.0000 mg | EXTENDED_RELEASE_TABLET | Freq: Two times a day (BID) | ORAL | 0 refills | Status: DC

## 2016-07-14 MED ORDER — OXYCODONE-ACETAMINOPHEN 10-325 MG PO TABS: 1.0000 | ORAL_TABLET | ORAL | 0 refills | Status: DC | PRN

## 2016-07-14 MED FILL — MORPHINE SULF ER 15 MG TAB: 15 | 30 days supply | Qty: 60 | Fill #0

## 2016-07-14 MED FILL — OXYCODONE-ACETAMINOPHEN 10-: 10-325 | 30 days supply | Qty: 180 | Fill #0

## 2016-07-14 MED FILL — ALPRAZolam 0.5 MG TABS: 0.5 | 23 days supply | Qty: 90 | Fill #0

## 2016-07-14 NOTE — Telephone Encounter (Signed)
Call from pt requesting refill of pain meds. Scripts printed and ready for pick up, pt aware.

## 2016-07-15 ENCOUNTER — Ambulatory Visit: Payer: Medicare Other | Admitting: Rheumatology

## 2016-07-16 ENCOUNTER — Other Ambulatory Visit: Payer: Self-pay | Admitting: Rheumatology

## 2016-07-16 NOTE — Telephone Encounter (Signed)
ok 

## 2016-07-16 NOTE — Telephone Encounter (Signed)
Last Visit: 04/28/16 Next Visit: 06/29/16 Labs: 05/27/16 Stable PLQ Eye Exam: PLQ eye exam 07/02/16. Noted Mild Macular drusen no PLQ toxicity  Okay to refill PLQ?

## 2016-07-28 ENCOUNTER — Ambulatory Visit (HOSPITAL_BASED_OUTPATIENT_CLINIC_OR_DEPARTMENT_OTHER): Payer: Medicare Other | Admitting: Nurse Practitioner

## 2016-07-28 ENCOUNTER — Other Ambulatory Visit (HOSPITAL_BASED_OUTPATIENT_CLINIC_OR_DEPARTMENT_OTHER): Payer: Medicare Other

## 2016-07-28 ENCOUNTER — Telehealth: Payer: Self-pay | Admitting: Oncology

## 2016-07-28 ENCOUNTER — Other Ambulatory Visit: Payer: Self-pay

## 2016-07-28 ENCOUNTER — Ambulatory Visit: Payer: Medicare Other

## 2016-07-28 VITALS — BP 117/47 | HR 80 | Temp 97.9°F | Resp 18 | Ht 63.0 in | Wt 216.7 lb

## 2016-07-28 DIAGNOSIS — R159 Full incontinence of feces: Secondary | ICD-10-CM

## 2016-07-28 DIAGNOSIS — C787 Secondary malignant neoplasm of liver and intrahepatic bile duct: Secondary | ICD-10-CM

## 2016-07-28 DIAGNOSIS — C7951 Secondary malignant neoplasm of bone: Secondary | ICD-10-CM

## 2016-07-28 DIAGNOSIS — E119 Type 2 diabetes mellitus without complications: Secondary | ICD-10-CM | POA: Diagnosis not present

## 2016-07-28 DIAGNOSIS — C50912 Malignant neoplasm of unspecified site of left female breast: Secondary | ICD-10-CM

## 2016-07-28 DIAGNOSIS — C50919 Malignant neoplasm of unspecified site of unspecified female breast: Secondary | ICD-10-CM

## 2016-07-28 DIAGNOSIS — D638 Anemia in other chronic diseases classified elsewhere: Secondary | ICD-10-CM | POA: Diagnosis not present

## 2016-07-28 DIAGNOSIS — N289 Disorder of kidney and ureter, unspecified: Secondary | ICD-10-CM

## 2016-07-28 DIAGNOSIS — D630 Anemia in neoplastic disease: Secondary | ICD-10-CM

## 2016-07-28 DIAGNOSIS — D6481 Anemia due to antineoplastic chemotherapy: Secondary | ICD-10-CM | POA: Diagnosis not present

## 2016-07-28 DIAGNOSIS — I10 Essential (primary) hypertension: Secondary | ICD-10-CM | POA: Diagnosis not present

## 2016-07-28 DIAGNOSIS — M069 Rheumatoid arthritis, unspecified: Secondary | ICD-10-CM | POA: Diagnosis not present

## 2016-07-28 DIAGNOSIS — D649 Anemia, unspecified: Secondary | ICD-10-CM

## 2016-07-28 LAB — COMPREHENSIVE METABOLIC PANEL
ALBUMIN: 3.5 g/dL (ref 3.5–5.0)
ALK PHOS: 105 U/L (ref 40–150)
ALT: 9 U/L (ref 0–55)
AST: 14 U/L (ref 5–34)
Anion Gap: 10 mEq/L (ref 3–11)
BUN: 27.7 mg/dL — AB (ref 7.0–26.0)
CO2: 22 meq/L (ref 22–29)
Calcium: 8.1 mg/dL — ABNORMAL LOW (ref 8.4–10.4)
Chloride: 107 mEq/L (ref 98–109)
Creatinine: 1.4 mg/dL — ABNORMAL HIGH (ref 0.6–1.1)
EGFR: 40 mL/min/{1.73_m2} — ABNORMAL LOW (ref >90)
GLUCOSE: 70 mg/dL (ref 70–140)
POTASSIUM: 4.5 meq/L (ref 3.5–5.1)
SODIUM: 140 meq/L (ref 136–145)
TOTAL PROTEIN: 6.8 g/dL (ref 6.4–8.3)
Total Bilirubin: 0.3 mg/dL (ref 0.20–1.20)

## 2016-07-28 LAB — CBC WITH DIFFERENTIAL/PLATELET
BASO%: 0.1 % (ref 0.0–2.0)
Basophils Absolute: 0 10*3/uL (ref 0.0–0.1)
EOS%: 4 % (ref 0.0–7.0)
Eosinophils Absolute: 0.2 10*3/uL (ref 0.0–0.5)
HCT: 35.6 % (ref 34.8–46.6)
HEMOGLOBIN: 11.7 g/dL (ref 11.6–15.9)
LYMPH%: 10 % — ABNORMAL LOW (ref 14.0–49.7)
MCH: 34.5 pg — AB (ref 25.1–34.0)
MCHC: 32.8 g/dL (ref 31.5–36.0)
MCV: 105 fL — ABNORMAL HIGH (ref 79.5–101.0)
MONO#: 0.6 10*3/uL (ref 0.1–0.9)
MONO%: 10.5 % (ref 0.0–14.0)
NEUT%: 75.4 % (ref 38.4–76.8)
NEUTROS ABS: 4.1 10*3/uL (ref 1.5–6.5)
Platelets: 143 10*3/uL — ABNORMAL LOW (ref 145–400)
RBC: 3.39 10*6/uL — ABNORMAL LOW (ref 3.70–5.45)
RDW: 15 % — ABNORMAL HIGH (ref 11.2–14.5)
WBC: 5.5 10*3/uL (ref 3.9–10.3)
lymph#: 0.5 10*3/uL — ABNORMAL LOW (ref 0.9–3.3)

## 2016-07-28 MED ORDER — CAPECITABINE 500 MG PO TABS: ORAL_TABLET | ORAL | 0 refills | Status: DC

## 2016-07-28 MED ORDER — DENOSUMAB 120 MG/1.7ML ~~LOC~~ SOLN: 120.0000 mg | Freq: Once | SUBCUTANEOUS | Status: DC

## 2016-07-28 NOTE — Progress Notes (Signed)
[facility] OFFICE PROGRESS NOTE   Diagnosis: Breast cancer  INTERVAL HISTORY:  [name] returns today as scheduled. She is due to complete another cycle of Xeloda on 07/30/16. She is mildly fatigued with a decreased appetite while she takes the medication. She is drinking well. Denies fever, chills, or weight loss. She has erythema to her fingertips that are painful when she touches hot items, such as bread out of the toaster. Her feet are red but not painful. She denies numbness, tingling, blisters, peeling, other rash, or skin breakdown. Overall this is better with a reduced dose. She has nausea, controlled with anti-emetics, and continues to have stool incontinence that occurs occasionally after standing. She does not have an urge to defecate and this occurs without warning. This is worse while on medication and imodium helps. She denies abdominal pain, constipation, mouth sores, cough, chest pain, or shortness of breath. She has stable lower back pain that radiates to her hips and increases with standing, at worst 5/10. It is controlled with pain regimen and she has no new pain.     Objective:  Vital signs in last 24 hours:  Blood pressure (!) 117/47, pulse 80, temperature 97.9 F (36.6 C), temperature source Oral, resp. rate 18, height '5\' 3"'  (1.6 m), weight 216 lb 11.2 oz (98.3 kg), SpO2 99 %.    HEENT: Oral cavity is pink and moist without ulcers or thrush.  Lymphatics: No cervical, supraclavicular, or axillary lymphadenopathy.  Resp: Normal effort. Clear to auscultation bilaterally, diminished in bases. No respiratory distress.  Cardio: Regular rate and rhythm. S1 and S2 normal. No audible murmur.  GI: Soft, round, nontender. Active bowel sounds throughout. No palpable hepatosplenomegaly or masses.  Vascular: No leg edema.  Skin: Mild erythema at distal fingertips bilaterally. Moderate erythema to feet, no dryness, blisters, peeling or skin breakdown. Left mastectomy  scar well healed without findings to suggest chest wall recurrence.    Lab Results:  Lab Results  Component Value Date   WBC 5.5 07/28/2016   HGB 11.7 07/28/2016   HCT 35.6 07/28/2016   MCV 105.0 (H) 07/28/2016   PLT 143 (L) 07/28/2016   NEUTROABS 4.1 07/28/2016    Imaging:  No results found.  Medications: I have reviewed the patient's current medications.  Assessment/Plan: 1. 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 ER positive, PR positive, HER-2 negative She completed 4 cycles of adjuvant AC chemotherapy June 15 through September 03, 2008, and then completed the left chest wall radiation. She began Arimidex following an office visit on November 11, 2008.  Bone scan 06/01/2013 suggestive of thoracic spine metastases, thoracic MRI 06/27/2011 consistent with multiple bone metastases involving the thoracolumbar spine   PET scan 07/09/2013 with multiple hypermetabolic bone lesions and hypermetabolic mediastinal nodes.   Left iliac lesion biopsy 07/26/2013. Pathology showed metastatic carcinoma, ER positive, PR positive, HER-2 negative   Initiation of Faslodex 08/10/2013.  Xgeva every 3 months initiated 07/27/2013.  Restaging PET scan 01/23/2014 with no residual hypermetabolic mediastinal disease; marked improvement in the metastatic bone disease.  Restaging PET scan 02/17/2015 with recurrence of skeletal metastasis. New hypermetabolic lesions within the pelvis and ribs. No hypermetabolic lymphadenopathy.  Continuation of Faslodex; initiation of Ibrance 03/05/2015  Cycle 2 Ibrance 04/09/2015-dose reduced to 100 mg daily  Cycle 3 Ibrance 05/08/2015  Cycle 4 Ibrance 06/11/2015  Cycle 5 Ibrance 07/15/2015  PET scan  08/12/2015-increase in size and increased metabolic activity associated with bone metastases, no new lesions  Ibrance/Faslodex  discontinued  Tamoxifen started 08/07/2015  PET scan 02/04/2016 with new hypermetabolic liver lesions and numerous new skeletal lesions.  Xeloda, 7 days on/7 days off initiated February 2018  Xeloda dose reduced secondary to hand/foot syndrome beginning on 05/04/2016  Restaging MRI liver 05/18/2016-2 dominant hepatic metastases measuring up to 13 mm favored to be improved.  Xeloda continued 2. Left chest subcutaneous mass noted on exam October 04, 2008, status post biopsy by Dr. Brantley Stage with a benign pathology. 3. History of delayed healing of the left mastectomy incision. 4. Rheumatoid arthritis. 5. Diabetes. 6. Hypercholesterolemia. 7. Hypertension. 8. Family history of breast cancer. She has been evaluated at the genetic screening clinic. 9. 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 10. 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 She is tolerating Xeloda 2 tablets BID on days 1 through 7 and 15 through 21 on a 28 day cycle. She will complete the current cycle on Friday 07/30/16 and begin the next dose on 08/07/17. She will continue her current pain management regimen and notify us with new or worsening pain to adjust as necessary. She will notify the clinic if stool incontinence worsens or fails to improve with imodium. She will return for labs and an office visit on 08/26/16.    Alla Feeling NP Student   07/28/2016  9:00 PM  Patient seen with Cira Rue. I agree with the above. [name] appears stable. She will continue Xeloda 7 days on/7 days  off. We will follow-up on the CA-27-29 from today. She will return for a follow-up visit in one month.  [name], NP

## 2016-07-28 NOTE — Patient Instructions (Signed)

## 2016-07-28 NOTE — Progress Notes (Signed)
Hold Xgeva Injection today for calcium of 8.1 per Dr. Benay Spice order

## 2016-07-28 NOTE — Telephone Encounter (Signed)
Gave patient avs report and appointments for August.  °

## 2016-07-29 LAB — CANCER ANTIGEN 27.29: CA 27.29: 21.7 U/mL (ref 0.0–38.6)

## 2016-08-02 MED FILL — XELODA 500 MG TABLET: 500 | 28 days supply | Qty: 56 | Fill #0

## 2016-08-06 NOTE — Progress Notes (Signed)
Office Visit Note  Patient: [name]             Date of Birth: 26-May-1950           MRN: [id number]             PCP: Aletha Halim., PA-C Referring: Aletha Halim., PA-C Visit Date: 08/12/2016 Occupation: @GUAROCC @    Subjective:  Lower back pain.   History of Present Illness: [name] is a 66 y.o. female with history of sero positive rheumatoid arthritis. She states she continues to have some discomfort in her hands. She has noticed more knots in her hands, she denies any joint swelling. She's having some side effects from the chemotherapy with the pain in her hands and peeling of his skin in her hands. I total hip replacement and right total knee replacement is doing well.  Activities of Daily Living:  Patient reports morning stiffness for 15 minutes.   Patient Reports nocturnal pain.  Difficulty dressing/grooming: Denies Difficulty climbing stairs: Reports Difficulty getting out of chair: Reports Difficulty using hands for taps, buttons, cutlery, and/or writing: Reports   Review of Systems  Constitutional: Positive for fatigue. Negative for night sweats, weight gain, weight loss and weakness.  HENT: Positive for mouth dryness. Negative for mouth sores, trouble swallowing, trouble swallowing and nose dryness.   Eyes: Negative for pain, redness, visual disturbance and dryness.  Respiratory: Negative for cough, shortness of breath and difficulty breathing.   Cardiovascular: Negative for chest pain, palpitations, hypertension, irregular heartbeat and swelling in legs/feet.  Gastrointestinal: Negative for blood in stool, constipation and diarrhea.  Endocrine: Negative for increased urination.  Genitourinary: Negative for vaginal dryness.  Musculoskeletal: Positive for arthralgias, joint pain and morning stiffness. Negative for joint swelling, myalgias, muscle weakness, muscle tenderness and myalgias.  Skin: Negative for color change, rash, hair loss, skin  tightness, ulcers and sensitivity to sunlight.  Allergic/Immunologic: Negative for susceptible to infections.  Neurological: Negative for dizziness, memory loss and night sweats.  Hematological: Negative for swollen glands.  Psychiatric/Behavioral: Positive for sleep disturbance. Negative for depressed mood. The patient is nervous/anxious.     PMFS History:  Patient Active Problem List   Diagnosis Date Noted  . Other fatigue 04/26/2016  . Seropositive rheumatoid arthritis of multiple sites ([place]) 11/22/2015  . 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 High risk medication use 11/22/2015  . Low kidney function 11/22/2015  . Breast cancer metastasized to bone ([place]) 07/27/2013  . History of breast cancer 02/04/2012  . Breast cancer ([place]) 01/04/2012    Past Medical History:  Diagnosis Date  . Arthritis   . Bone metastases ([place])    breast primary  . Breast cancer ([place])   . Cancer ([place])    left breast  . Diabetes mellitus   . History of radiation therapy 10/2008   left chest wall, left supraclavicular region, mastectomy scar  . Hyperlipidemia   . Hypertension     Family History  Problem Relation Age of Onset  . Diabetes Mother   . Hypertension Mother   . Heart disease Mother   . Diabetes Father   . Hypertension Father   . Heart disease Father   . Cancer Sister        breast   Past Surgical History:  Procedure Laterality Date  . BONE DENSITY TEST  2008  . BREAST SURGERY    . CHOLECYSTECTOMY    . COLONOSCOPY  2007  . MASTECTOMY  2010  LEFT BREAST  . PORT-A-CATH REMOVAL    . PORTACATH PLACEMENT    . REPLACEMENT TOTAL KNEE  2008   RIGHT KNEE  . TOTAL HIP ARTHROPLASTY  2004   RIGHT HIP   Social History   Social History Narrative  . No narrative on file     Objective: Vital Signs: BP (!) 143/72   Pulse 90   Resp 14   Ht 5' 2.5" (1.588 m)   Wt 194 lb (88 kg)   BMI 34.92 kg/m    Physical Exam  Constitutional: She is oriented to person, place, and time. She  appears well-developed and well-nourished.  HENT:  Head: Normocephalic and atraumatic.  Eyes: Conjunctivae and EOM are normal.  Neck: Normal range of motion.  Cardiovascular: Normal rate, regular rhythm, normal heart sounds and intact distal pulses.   Pulmonary/Chest: Effort normal and breath sounds normal.  Abdominal: Soft. Bowel sounds are normal.  Lymphadenopathy:    She has no cervical adenopathy.  Neurological: She is alert and oriented to person, place, and time.  Skin: Skin is warm and dry. Capillary refill takes less than 2 seconds.  Nodule noted on right fourth finger  Psychiatric: She has a normal mood and affect. Her behavior is normal.  Nursing note and vitals reviewed.    Musculoskeletal Exam: C-spine limited range of motion thoracic kyphosis and scoliosis noted. Shoulder joints limited range of motion. Right elbow joint contracture noted. Limited range of motion of bilateral wrists joint. Synovial thickening noted over bilateral MCP joints. Synovitis as described below. Right total hip replacement and right total knee replacement is doing well. None of the other joints were inflamed. Next blood work will  CDAI Exam: CDAI Homunculus Exam:   Tenderness:  Right hand: 4th PIP Left hand: 1st MCP  Swelling:  Left hand: 1st MCP  Joint Counts:  CDAI Tender Joint count: 2 CDAI Swollen Joint count: 1  Global Assessments:  Patient Global Assessment: 1 Provider Global Assessment: 4  CDAI Calculated Score: 8    Investigation: Findings:   PLQ eye exam 07/02/16. Noted Mild Macular drusen no PLQ toxicity  CBC Latest Ref Rng & Units 07/28/2016 06/29/2016 05/27/2016  WBC 3.9 - 10.3 10e3/uL 5.5 4.9 5.8  Hemoglobin 11.6 - 15.9 g/dL 11.7 11.2(L) 10.7(L)  Hematocrit 34.8 - 46.6 % 35.6 33.3(L) 31.6(L)  Platelets 145 - 400 10e3/uL 143(L) 146 157    CMP Latest Ref Rng & Units 07/28/2016 06/29/2016 05/27/2016  Glucose 70 - 140 mg/dl 70 93 99  BUN 7.0 - 26.0 mg/dL 27.7(H) 25.0  27.0(H)  Creatinine 0.6 - 1.1 mg/dL 1.4(H) 1.3(H) 1.4(H)  Sodium 136 - 145 mEq/L 140 141 142  Potassium 3.5 - 5.1 mEq/L 4.5 4.9 3.9  Chloride 96 - 106 mmol/L - - -  CO2 22 - 29 mEq/L 22 23 23   Calcium 8.4 - 10.4 mg/dL 8.1(L) 7.9(L) 8.6  Total Protein 6.4 - 8.3 g/dL 6.8 6.8 6.9  Total Bilirubin 0.20 - 1.20 mg/dL 0.30 0.39 0.31  Alkaline Phos 40 - 150 U/L 105 109 112  AST 5 - 34 U/L 14 16 19   ALT 0 - 55 U/L 9 7 11     Imaging: No results found.  Speciality Comments: No specialty comments available.    Procedures:  No procedures performed Allergies: Doxycycline   Assessment / Plan:     Visit Diagnoses: Seropositive rheumatoid arthritis of multiple sites [facility]): Overall she's doing better. She has minimal synovitis which is described above. She is tolerating her medications  well. I had detailed discussion regarding tapering her prednisone. She will try going to 1 mg of prednisone and if she tolerates it for the 3 months she might want to try to reduce prednisone to 1 mg every other day. She will continue Arava and Plaquenil as prescribed. She is aware of having labs and eye exams on regular basis.  High risk medication use -  Plaquenil 200 mg by mouth daily, Arava 10 mg by mouth every other day, prednisone 2 mg by mouth daily  Chronic prednisone use: I discussed bone density with her. She states her oncologist plan to reschedule DEXA scan. She's been taking calcium and vitamin D.  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  Low kidney function: Stable  Other fatigue    Orders: No orders of the defined types were placed in this encounter.  No orders of the defined types were placed in this encounter.   Face-to-face time spent with patient was 30 minutes. 50% of time was  spent in counseling and coordination of care.  Follow-Up Instructions: Return in about 5 months (around 01/12/2017) for Rheumatoid arthritis.   Bo Merino, MD  Note - This record has been created using Editor, commissioning.  Chart creation errors have been sought, but may not always  have been located. Such creation errors do not reflect on  the standard of medical care.

## 2016-08-10 ENCOUNTER — Other Ambulatory Visit: Payer: Self-pay | Admitting: Oncology

## 2016-08-10 DIAGNOSIS — C50919 Malignant neoplasm of unspecified site of unspecified female breast: Secondary | ICD-10-CM

## 2016-08-10 DIAGNOSIS — C7951 Secondary malignant neoplasm of bone: Principal | ICD-10-CM

## 2016-08-10 MED FILL — ALPRAZolam 0.5 MG TABS: 0.5 | 23 days supply | Qty: 90 | Fill #0

## 2016-08-10 NOTE — Telephone Encounter (Signed)
Message from pt requesting Percocet and MS Contin refill. Left message informing her prescriptions can be picked up on 7/26.

## 2016-08-12 ENCOUNTER — Encounter: Payer: Self-pay | Admitting: Rheumatology

## 2016-08-12 ENCOUNTER — Ambulatory Visit (INDEPENDENT_AMBULATORY_CARE_PROVIDER_SITE_OTHER): Payer: Medicare Other | Admitting: Rheumatology

## 2016-08-12 ENCOUNTER — Other Ambulatory Visit: Payer: Self-pay

## 2016-08-12 VITALS — BP 143/72 | HR 90 | Resp 14 | Ht 62.5 in | Wt 194.0 lb

## 2016-08-12 DIAGNOSIS — M24521 Contracture, right elbow: Secondary | ICD-10-CM

## 2016-08-12 DIAGNOSIS — M0579 Rheumatoid arthritis with rheumatoid factor of multiple sites without organ or systems involvement: Secondary | ICD-10-CM | POA: Diagnosis not present

## 2016-08-12 DIAGNOSIS — Z96641 Presence of right artificial hip joint: Secondary | ICD-10-CM | POA: Diagnosis not present

## 2016-08-12 DIAGNOSIS — R5383 Other fatigue: Secondary | ICD-10-CM

## 2016-08-12 DIAGNOSIS — Z96651 Presence of right artificial knee joint: Secondary | ICD-10-CM | POA: Diagnosis not present

## 2016-08-12 DIAGNOSIS — C7951 Secondary malignant neoplasm of bone: Principal | ICD-10-CM

## 2016-08-12 DIAGNOSIS — Z79899 Other long term (current) drug therapy: Secondary | ICD-10-CM | POA: Diagnosis not present

## 2016-08-12 DIAGNOSIS — C50919 Malignant neoplasm of unspecified site of unspecified female breast: Secondary | ICD-10-CM

## 2016-08-12 DIAGNOSIS — Z853 Personal history of malignant neoplasm of breast: Secondary | ICD-10-CM | POA: Diagnosis not present

## 2016-08-12 DIAGNOSIS — N289 Disorder of kidney and ureter, unspecified: Secondary | ICD-10-CM | POA: Diagnosis not present

## 2016-08-12 MED ORDER — MORPHINE SULFATE ER 15 MG PO TBCR: 15.0000 mg | EXTENDED_RELEASE_TABLET | Freq: Two times a day (BID) | ORAL | 0 refills | Status: DC

## 2016-08-12 MED ORDER — OXYCODONE-ACETAMINOPHEN 10-325 MG PO TABS: 1.0000 | ORAL_TABLET | ORAL | 0 refills | Status: DC | PRN

## 2016-08-12 MED FILL — OXYCODONE-APAP 10-325 MG TA: 10-325 | 30 days supply | Qty: 180 | Fill #0

## 2016-08-12 MED FILL — MORPHINE SULF ER 15 MG TAB: 15 | 30 days supply | Qty: 60 | Fill #0

## 2016-08-12 NOTE — Patient Instructions (Addendum)
Standing Labs We placed an order today for your standing lab work.    Please come back and get your standing labs in October and every 3 months  We have open lab Monday through Friday from 8:30-11:30 AM and 1:30-4 PM at the office of Dr. Tylisha Danis.   The office is located at [address], [address], [place], [place] [address] No appointment is necessary.   Labs are drawn by [facility].  You may receive a bill from [facility] for your lab work. If you have any questions regarding directions or hours of operation,  please call [phone].    

## 2016-08-24 ENCOUNTER — Other Ambulatory Visit: Payer: Self-pay | Admitting: Nurse Practitioner

## 2016-08-26 ENCOUNTER — Ambulatory Visit (HOSPITAL_BASED_OUTPATIENT_CLINIC_OR_DEPARTMENT_OTHER): Payer: Medicare Other | Admitting: Oncology

## 2016-08-26 ENCOUNTER — Other Ambulatory Visit (HOSPITAL_BASED_OUTPATIENT_CLINIC_OR_DEPARTMENT_OTHER): Payer: Medicare Other

## 2016-08-26 ENCOUNTER — Ambulatory Visit (HOSPITAL_BASED_OUTPATIENT_CLINIC_OR_DEPARTMENT_OTHER): Payer: Medicare Other

## 2016-08-26 ENCOUNTER — Telehealth: Payer: Self-pay | Admitting: Oncology

## 2016-08-26 VITALS — BP 101/79 | HR 82 | Temp 98.5°F | Ht 62.5 in | Wt 218.3 lb

## 2016-08-26 DIAGNOSIS — M545 Low back pain: Secondary | ICD-10-CM

## 2016-08-26 DIAGNOSIS — R102 Pelvic and perineal pain: Secondary | ICD-10-CM

## 2016-08-26 DIAGNOSIS — C787 Secondary malignant neoplasm of liver and intrahepatic bile duct: Secondary | ICD-10-CM | POA: Diagnosis not present

## 2016-08-26 DIAGNOSIS — D6481 Anemia due to antineoplastic chemotherapy: Secondary | ICD-10-CM | POA: Diagnosis not present

## 2016-08-26 DIAGNOSIS — D630 Anemia in neoplastic disease: Secondary | ICD-10-CM | POA: Diagnosis not present

## 2016-08-26 DIAGNOSIS — D638 Anemia in other chronic diseases classified elsewhere: Secondary | ICD-10-CM

## 2016-08-26 DIAGNOSIS — C7951 Secondary malignant neoplasm of bone: Secondary | ICD-10-CM

## 2016-08-26 DIAGNOSIS — C50919 Malignant neoplasm of unspecified site of unspecified female breast: Secondary | ICD-10-CM

## 2016-08-26 DIAGNOSIS — N289 Disorder of kidney and ureter, unspecified: Secondary | ICD-10-CM | POA: Diagnosis not present

## 2016-08-26 DIAGNOSIS — I10 Essential (primary) hypertension: Secondary | ICD-10-CM

## 2016-08-26 DIAGNOSIS — E119 Type 2 diabetes mellitus without complications: Secondary | ICD-10-CM | POA: Diagnosis not present

## 2016-08-26 DIAGNOSIS — M069 Rheumatoid arthritis, unspecified: Secondary | ICD-10-CM

## 2016-08-26 DIAGNOSIS — D649 Anemia, unspecified: Secondary | ICD-10-CM

## 2016-08-26 LAB — CBC WITH DIFFERENTIAL/PLATELET
BASO%: 0.2 % (ref 0.0–2.0)
Basophils Absolute: 0 10*3/uL (ref 0.0–0.1)
EOS%: 3 % (ref 0.0–7.0)
Eosinophils Absolute: 0.2 10*3/uL (ref 0.0–0.5)
HCT: 36.6 % (ref 34.8–46.6)
HGB: 12 g/dL (ref 11.6–15.9)
LYMPH%: 10.7 % — AB (ref 14.0–49.7)
MCH: 33.4 pg (ref 25.1–34.0)
MCHC: 32.8 g/dL (ref 31.5–36.0)
MCV: 101.9 fL — ABNORMAL HIGH (ref 79.5–101.0)
MONO#: 0.6 10*3/uL (ref 0.1–0.9)
MONO%: 9.3 % (ref 0.0–14.0)
NEUT#: 4.8 10*3/uL (ref 1.5–6.5)
NEUT%: 76.8 % (ref 38.4–76.8)
Platelets: 139 10*3/uL — ABNORMAL LOW (ref 145–400)
RBC: 3.59 10*6/uL — ABNORMAL LOW (ref 3.70–5.45)
RDW: 14.2 % (ref 11.2–14.5)
WBC: 6.3 10*3/uL (ref 3.9–10.3)
lymph#: 0.7 10*3/uL — ABNORMAL LOW (ref 0.9–3.3)
nRBC: 0 % (ref 0–0)

## 2016-08-26 LAB — COMPREHENSIVE METABOLIC PANEL
ALK PHOS: 113 U/L (ref 40–150)
ALT: 12 U/L (ref 0–55)
AST: 18 U/L (ref 5–34)
Albumin: 3.6 g/dL (ref 3.5–5.0)
Anion Gap: 11 mEq/L (ref 3–11)
BUN: 31.8 mg/dL — AB (ref 7.0–26.0)
CALCIUM: 10.8 mg/dL — AB (ref 8.4–10.4)
CHLORIDE: 107 meq/L (ref 98–109)
CO2: 20 mEq/L — ABNORMAL LOW (ref 22–29)
Creatinine: 1.3 mg/dL — ABNORMAL HIGH (ref 0.6–1.1)
EGFR: 41 mL/min/{1.73_m2} — ABNORMAL LOW (ref >90)
GLUCOSE: 80 mg/dL (ref 70–140)
POTASSIUM: 4.2 meq/L (ref 3.5–5.1)
SODIUM: 139 meq/L (ref 136–145)
Total Bilirubin: 0.35 mg/dL (ref 0.20–1.20)
Total Protein: 7.1 g/dL (ref 6.4–8.3)

## 2016-08-26 MED ORDER — DENOSUMAB 120 MG/1.7ML ~~LOC~~ SOLN
120.0000 mg | Freq: Once | SUBCUTANEOUS | Status: AC
  Administered 2016-08-26: 120 mg via SUBCUTANEOUS
  Filled 2016-08-26: qty 1.7

## 2016-08-26 MED ORDER — PROCHLORPERAZINE MALEATE 10 MG PO TABS: 10.0000 mg | ORAL_TABLET | Freq: Four times a day (QID) | ORAL | 1 refills | Status: DC | PRN

## 2016-08-26 NOTE — Telephone Encounter (Signed)
Scheduled appt per 8/9 los - Gave patient AVS and calender per los.  

## 2016-08-26 NOTE — Progress Notes (Signed)
[facility] OFFICE PROGRESS NOTE   Diagnosis: Breast cancer  INTERVAL HISTORY:   [name] returns as scheduled. She continues Xeloda. Skin thickening at the palms and soles. No mouth sores or diarrhea. Stable back/pelvic pain.  Objective:  Vital signs in last 24 hours:  Blood pressure 101/79, pulse 82, temperature 98.5 F (36.9 C), temperature source Oral, height 5' 2.5" (1.588 m), weight 218 lb 4.8 oz (99 kg), SpO2 99 %.    HEENT: No thrush, small healing ulcer at the posterior left buccal mucosa Resp: Lungs clear bilaterally Cardio: Regular rate and rhythm GI: No hepatomegaly Vascular: No leg edema  Skin: Mild erythema and skin thickening at the palms and soles   Portacath/PICC-without erythema  Lab Results:  Lab Results  Component Value Date   WBC 6.3 08/26/2016   HGB 12.0 08/26/2016   HCT 36.6 08/26/2016   MCV 101.9 (H) 08/26/2016   PLT 139 (L) 08/26/2016   NEUTROABS 4.8 08/26/2016    CMP     Component Value Date/Time   NA 139 08/26/2016 1027   K 4.2 08/26/2016 1027   CL 103 03/25/2016 1600   CO2 20 (L) 08/26/2016 1027   GLUCOSE 80 08/26/2016 1027   BUN 31.8 (H) 08/26/2016 1027   CREATININE 1.3 (H) 08/26/2016 1027   CALCIUM 10.8 (H) 08/26/2016 1027   PROT 7.1 08/26/2016 1027   ALBUMIN 3.6 08/26/2016 1027   AST 18 08/26/2016 1027   ALT 12 08/26/2016 1027   ALKPHOS 113 08/26/2016 1027   BILITOT 0.35 08/26/2016 1027   GFRNONAA 39 (L) 03/25/2016 1600   GFRAA 45 (L) 03/25/2016 1600   CA-27-29 on 07/28/2016: 21.7  Medications: I have reviewed the patient's current medications.  Assessment/Plan: 1. 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 ER positive, PR positive, HER-2 negative She completed 4 cycles of adjuvant AC chemotherapy June 15 through September 03, 2008, and then completed the left chest wall  radiation. She began Arimidex following an office visit on November 11, 2008.  Bone scan 06/01/2013 suggestive of thoracic spine metastases, thoracic MRI 06/27/2011 consistent with multiple bone metastases involving the thoracolumbar spine   PET scan 07/09/2013 with multiple hypermetabolic bone lesions and hypermetabolic mediastinal nodes.   Left iliac lesion biopsy 07/26/2013. Pathology showed metastatic carcinoma, ER positive, PR positive, HER-2 negative   Initiation of Faslodex 08/10/2013.  Xgeva every 3 months initiated 07/27/2013.  Restaging PET scan 01/23/2014 with no residual hypermetabolic mediastinal disease; marked improvement in the metastatic bone disease.  Restaging PET scan 02/17/2015 with recurrence of skeletal metastasis. New hypermetabolic lesions within the pelvis and ribs. No hypermetabolic lymphadenopathy.  Continuation of Faslodex; initiation of Ibrance 03/05/2015  Cycle 2 Ibrance 04/09/2015-dose reduced to 100 mg daily  Cycle 3 Ibrance 05/08/2015  Cycle 4 Ibrance 06/11/2015  Cycle 5 Ibrance 07/15/2015  PET scan 08/12/2015-increase in size and increased metabolic activity associated with bone metastases, no new lesions  Ibrance/Faslodex discontinued  Tamoxifen started 08/07/2015  PET scan 02/04/2016 with new hypermetabolic liver lesions and numerous new skeletal lesions.  Xeloda, 7 days on/7 days off initiated February 2018  Xeloda dose reduced secondary to hand/foot syndrome beginning on 05/04/2016  Restaging MRI liver 05/18/2016-2 dominant hepatic metastases measuring up to 13 mm favored to be improved.  Xeloda continued 2. Left chest subcutaneous mass noted on exam October 04, 2008, status post biopsy by Dr. Brantley Stage with a benign  pathology. 3. History of delayed healing of the left mastectomy incision. 4. Rheumatoid arthritis. 5. Diabetes. 6. Hypercholesterolemia. 7. Hypertension. 8. Family history of breast cancer. She has been evaluated at  the genetic screening clinic. 9. 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 10. 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 Hand/foot syndrome secondary to Xeloda-Xeloda dose reduced 05/05/2015;improved 05/27/2016    Disposition:  [name] appears unchanged. There is no clinical evidence of disease progression. She is tolerating the Xeloda well. The plan is to continue Xeloda on a 7 day on/7 day off schedule. She will receive Xgeva today. She will continue the current narcotic pain regimen.  [name] will return for an office visit in one month.  15 minutes were spent with the patient today. The majority of the time was used for counseling and coordination of care.  Donneta Romberg, MD  08/26/2016  11:49 AM

## 2016-08-27 LAB — CANCER ANTIGEN 27.29: CA 27.29: 30.3 U/mL (ref 0.0–38.6)

## 2016-08-30 MED FILL — PROCHLORPERAZINE 10 MG TAB: 10 | 15 days supply | Qty: 60 | Fill #1

## 2016-09-01 ENCOUNTER — Other Ambulatory Visit: Payer: Self-pay

## 2016-09-01 MED ORDER — CAPECITABINE 500 MG PO TABS: ORAL_TABLET | ORAL | 0 refills | Status: DC

## 2016-09-01 MED FILL — XELODA 500 MG TABLET: 500 | 28 days supply | Qty: 56 | Fill #0

## 2016-09-08 ENCOUNTER — Other Ambulatory Visit: Payer: Self-pay

## 2016-09-08 ENCOUNTER — Other Ambulatory Visit: Payer: Self-pay | Admitting: Oncology

## 2016-09-08 ENCOUNTER — Telehealth: Payer: Self-pay

## 2016-09-08 DIAGNOSIS — C7951 Secondary malignant neoplasm of bone: Principal | ICD-10-CM

## 2016-09-08 DIAGNOSIS — C50919 Malignant neoplasm of unspecified site of unspecified female breast: Secondary | ICD-10-CM

## 2016-09-08 MED ORDER — MORPHINE SULFATE ER 15 MG PO TBCR: 15.0000 mg | EXTENDED_RELEASE_TABLET | Freq: Two times a day (BID) | ORAL | 0 refills | Status: DC

## 2016-09-08 MED ORDER — OXYCODONE-ACETAMINOPHEN 10-325 MG PO TABS: 1.0000 | ORAL_TABLET | ORAL | 0 refills | Status: DC | PRN

## 2016-09-08 MED FILL — ALPRAZolam 0.5 MG TABS: 0.5 | 23 days supply | Qty: 90 | Fill #0

## 2016-09-08 NOTE — Telephone Encounter (Signed)
Call placed to patient to inform her that he prescription can be picked up on 8/24 from the front desk at [facility] with her photo ID during business hours. Pt verbalizes understanding and is appreciative of call back.

## 2016-09-08 NOTE — Telephone Encounter (Signed)
Pt called for percocet and morphine refills to be picked up on Friday.

## 2016-09-08 NOTE — Telephone Encounter (Signed)
Xanax prescription called in to pt's pharmacy.

## 2016-09-10 MED FILL — OXYCODONE-APAP 10-325 MG TA: 10-325 | 30 days supply | Qty: 180 | Fill #0

## 2016-09-10 MED FILL — MORPHINE SULF ER 15 MG TAB: 15 | 30 days supply | Qty: 60 | Fill #0

## 2016-09-23 ENCOUNTER — Other Ambulatory Visit (HOSPITAL_BASED_OUTPATIENT_CLINIC_OR_DEPARTMENT_OTHER): Payer: Medicare Other

## 2016-09-23 ENCOUNTER — Ambulatory Visit (HOSPITAL_BASED_OUTPATIENT_CLINIC_OR_DEPARTMENT_OTHER): Payer: Medicare Other | Admitting: Oncology

## 2016-09-23 ENCOUNTER — Other Ambulatory Visit: Payer: Medicare Other

## 2016-09-23 ENCOUNTER — Telehealth: Payer: Self-pay

## 2016-09-23 ENCOUNTER — Ambulatory Visit: Payer: Medicare Other

## 2016-09-23 ENCOUNTER — Ambulatory Visit: Payer: Medicare Other | Admitting: Oncology

## 2016-09-23 VITALS — BP 142/63 | HR 90 | Resp 18 | Ht 62.5 in | Wt 213.6 lb

## 2016-09-23 DIAGNOSIS — G893 Neoplasm related pain (acute) (chronic): Secondary | ICD-10-CM | POA: Diagnosis not present

## 2016-09-23 DIAGNOSIS — I10 Essential (primary) hypertension: Secondary | ICD-10-CM

## 2016-09-23 DIAGNOSIS — C7951 Secondary malignant neoplasm of bone: Secondary | ICD-10-CM | POA: Diagnosis not present

## 2016-09-23 DIAGNOSIS — E119 Type 2 diabetes mellitus without complications: Secondary | ICD-10-CM

## 2016-09-23 DIAGNOSIS — C787 Secondary malignant neoplasm of liver and intrahepatic bile duct: Secondary | ICD-10-CM | POA: Diagnosis not present

## 2016-09-23 DIAGNOSIS — C50919 Malignant neoplasm of unspecified site of unspecified female breast: Secondary | ICD-10-CM | POA: Diagnosis not present

## 2016-09-23 LAB — CBC WITH DIFFERENTIAL/PLATELET
BASO%: 0 % (ref 0.0–2.0)
BASOS ABS: 0 10*3/uL (ref 0.0–0.1)
EOS%: 3 % (ref 0.0–7.0)
Eosinophils Absolute: 0.2 10*3/uL (ref 0.0–0.5)
HEMATOCRIT: 35.7 % (ref 34.8–46.6)
HEMOGLOBIN: 11.7 g/dL (ref 11.6–15.9)
LYMPH#: 0.7 10*3/uL — AB (ref 0.9–3.3)
LYMPH%: 9.8 % — ABNORMAL LOW (ref 14.0–49.7)
MCH: 33.6 pg (ref 25.1–34.0)
MCHC: 32.8 g/dL (ref 31.5–36.0)
MCV: 102.6 fL — AB (ref 79.5–101.0)
MONO#: 0.7 10*3/uL (ref 0.1–0.9)
MONO%: 9.8 % (ref 0.0–14.0)
NEUT%: 77.4 % — ABNORMAL HIGH (ref 38.4–76.8)
NEUTROS ABS: 5.7 10*3/uL (ref 1.5–6.5)
Platelets: 147 10*3/uL (ref 145–400)
RBC: 3.48 10*6/uL — ABNORMAL LOW (ref 3.70–5.45)
RDW: 15.1 % — AB (ref 11.2–14.5)
WBC: 7.3 10*3/uL (ref 3.9–10.3)

## 2016-09-23 LAB — COMPREHENSIVE METABOLIC PANEL
ALBUMIN: 3.5 g/dL (ref 3.5–5.0)
ALT: 9 U/L (ref 0–55)
AST: 17 U/L (ref 5–34)
Alkaline Phosphatase: 91 U/L (ref 40–150)
Anion Gap: 11 mEq/L (ref 3–11)
BUN: 28.2 mg/dL — AB (ref 7.0–26.0)
CALCIUM: 9.7 mg/dL (ref 8.4–10.4)
CO2: 21 mEq/L — ABNORMAL LOW (ref 22–29)
Chloride: 108 mEq/L (ref 98–109)
Creatinine: 1.4 mg/dL — ABNORMAL HIGH (ref 0.6–1.1)
EGFR: 40 mL/min/{1.73_m2} — ABNORMAL LOW (ref >90)
Glucose: 75 mg/dl (ref 70–140)
POTASSIUM: 4.4 meq/L (ref 3.5–5.1)
Sodium: 140 mEq/L (ref 136–145)
TOTAL PROTEIN: 7.1 g/dL (ref 6.4–8.3)
Total Bilirubin: 0.37 mg/dL (ref 0.20–1.20)

## 2016-09-23 MED ORDER — CAPECITABINE 500 MG PO TABS: ORAL_TABLET | ORAL | 0 refills | Status: DC

## 2016-09-23 MED ORDER — MORPHINE SULFATE ER 15 MG PO TBCR: EXTENDED_RELEASE_TABLET | ORAL | 0 refills | Status: DC

## 2016-09-23 NOTE — Telephone Encounter (Signed)
Gave patient avs and calender for 10/4 per los 

## 2016-09-23 NOTE — Progress Notes (Signed)
[facility] OFFICE PROGRESS NOTE   Diagnosis: Breast cancer  INTERVAL HISTORY:   [name] returns as scheduled. She continues every other week Xeloda. No mouth sores or diarrhea. She has mild erythema and discomfort at the soles. She complains of increased pain at the lower ribs bilaterally. She takes approximately 6 Percocet tablets per day for breakthrough pain. She has again been scheduled for jury duty.  Objective:  Vital signs in last 24 hours:  Blood pressure (!) 142/63, pulse 90, resp. rate 18, height 5' 2.5" (1.588 m), weight 213 lb 9.6 oz (96.9 kg), SpO2 100 %.    HEENT: No thrush or ulcers Resp: Lungs clear bilaterally Cardio: Regular rate and rhythm GI: No hepatosplenomegaly Vascular: No leg edema Musculoskeletal: Tender at the lower anterior ribs bilaterally  Skin: Mild erythema at the palms, mild erythema with skin thickening at the soles. No skin breakdown.     Lab Results:  Lab Results  Component Value Date   WBC 7.3 09/23/2016   HGB 11.7 09/23/2016   HCT 35.7 09/23/2016   MCV 102.6 (H) 09/23/2016   PLT 147 09/23/2016   NEUTROABS 5.7 09/23/2016    CMP     Component Value Date/Time   NA 140 09/23/2016 0959   K 4.4 09/23/2016 0959   CL 103 03/25/2016 1600   CO2 21 (L) 09/23/2016 0959   GLUCOSE 75 09/23/2016 0959   BUN 28.2 (H) 09/23/2016 0959   CREATININE 1.4 (H) 09/23/2016 0959   CALCIUM 9.7 09/23/2016 0959   PROT 7.1 09/23/2016 0959   ALBUMIN 3.5 09/23/2016 0959   AST 17 09/23/2016 0959   ALT 9 09/23/2016 0959   ALKPHOS 91 09/23/2016 0959   BILITOT 0.37 09/23/2016 0959   GFRNONAA 39 (L) 03/25/2016 1600   GFRAA 45 (L) 03/25/2016 1600    Medications: I have reviewed the patient's current medications.  Assessment/Plan: 1. 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 ER positive, PR positive,  HER-2 negative She completed 4 cycles of adjuvant AC chemotherapy June 15 through September 03, 2008, and then completed the left chest wall radiation. She began Arimidex following an office visit on November 11, 2008.  Bone scan 06/01/2013 suggestive of thoracic spine metastases, thoracic MRI 06/27/2011 consistent with multiple bone metastases involving the thoracolumbar spine   PET scan 07/09/2013 with multiple hypermetabolic bone lesions and hypermetabolic mediastinal nodes.   Left iliac lesion biopsy 07/26/2013. Pathology showed metastatic carcinoma, ER positive, PR positive, HER-2 negative   Initiation of Faslodex 08/10/2013.  Xgeva every 3 months initiated 07/27/2013.  Restaging PET scan 01/23/2014 with no residual hypermetabolic mediastinal disease; marked improvement in the metastatic bone disease.  Restaging PET scan 02/17/2015 with recurrence of skeletal metastasis. New hypermetabolic lesions within the pelvis and ribs. No hypermetabolic lymphadenopathy.  Continuation of Faslodex; initiation of Ibrance 03/05/2015  Cycle 2 Ibrance 04/09/2015-dose reduced to 100 mg daily  Cycle 3 Ibrance 05/08/2015  Cycle 4 Ibrance 06/11/2015  Cycle 5 Ibrance 07/15/2015  PET scan 08/12/2015-increase in size and increased metabolic activity associated with bone metastases, no new lesions  Ibrance/Faslodex discontinued  Tamoxifen started 08/07/2015  PET scan 02/04/2016 with new hypermetabolic liver lesions and numerous new skeletal lesions.  Xeloda, 7 days on/7 days off initiated February 2018  Xeloda dose reduced secondary to hand/foot syndrome beginning on 05/04/2016  Restaging MRI liver 05/18/2016-2 dominant hepatic metastases measuring up to 13 mm  favored to be improved.  Xeloda continued 2. Left chest subcutaneous mass noted on exam October 04, 2008, status post biopsy by Dr. Brantley Stage with a benign pathology. 3. History of delayed healing of the left mastectomy  incision. 4. Rheumatoid arthritis. 5. Diabetes. 6. Hypercholesterolemia. 7. Hypertension. 8. Family history of breast cancer. She has been evaluated at the genetic screening clinic. 9. 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 10. 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 Hand/foot syndrome secondary to Xeloda-Xeloda dose reduced 05/05/2015  Disposition:  Her overall status appears unchanged. She continues Xeloda 7 days on/7 days off. She is maintained on Xgeva every 3 months.  We will plan for a restaging PET scan within the next few months.  [name] has pain secondary to metastatic breast cancer involving the bones. She will increase the MS Contin to 30 mg in the morning and 15 mg in the evening. She will continue oxycodone for breakthrough pain.  She will return for an office visit in one month.  25 minutes were spent with the patient today. The majority of the time was used for counseling and coordination of care.  Donneta Romberg, MD  09/23/2016  10:49 AM

## 2016-09-25 MED FILL — MORPHINE SULF ER 15 MG TAB: 15 | 30 days supply | Qty: 90 | Fill #0

## 2016-09-27 MED FILL — XELODA 500 MG TABLET: 500 | 28 days supply | Qty: 56 | Fill #0

## 2016-10-11 ENCOUNTER — Other Ambulatory Visit: Payer: Self-pay

## 2016-10-11 ENCOUNTER — Telehealth: Payer: Self-pay

## 2016-10-11 DIAGNOSIS — C50919 Malignant neoplasm of unspecified site of unspecified female breast: Secondary | ICD-10-CM

## 2016-10-11 DIAGNOSIS — C7951 Secondary malignant neoplasm of bone: Principal | ICD-10-CM

## 2016-10-11 MED ORDER — OXYCODONE-ACETAMINOPHEN 10-325 MG PO TABS: 1.0000 | ORAL_TABLET | ORAL | 0 refills | Status: DC | PRN

## 2016-10-11 NOTE — Telephone Encounter (Signed)
Call placed to inform patient that she can pick up her prescription at [facility] during office hours. Pt verbalizes understanding and agrees with plan of care.

## 2016-10-12 MED FILL — OXYCODONE-APAP 10-325 MG TA: 10-325 | 30 days supply | Qty: 180 | Fill #0

## 2016-10-18 ENCOUNTER — Other Ambulatory Visit: Payer: Self-pay | Admitting: Oncology

## 2016-10-18 DIAGNOSIS — C7951 Secondary malignant neoplasm of bone: Principal | ICD-10-CM

## 2016-10-18 DIAGNOSIS — C50919 Malignant neoplasm of unspecified site of unspecified female breast: Secondary | ICD-10-CM

## 2016-10-19 ENCOUNTER — Other Ambulatory Visit: Payer: Self-pay | Admitting: Rheumatology

## 2016-10-19 NOTE — Telephone Encounter (Signed)
Last Visit: 08/12/16 Next Visit: 01/24/17 Labs: 09/23/16 stable PLQ eye exam 07/02/16. Noted Mild Macular drusen no PLQ toxicity.  Okay to refill per Dr. Estanislado Pandy

## 2016-10-21 ENCOUNTER — Other Ambulatory Visit (HOSPITAL_BASED_OUTPATIENT_CLINIC_OR_DEPARTMENT_OTHER): Payer: Medicare Other

## 2016-10-21 ENCOUNTER — Ambulatory Visit (HOSPITAL_BASED_OUTPATIENT_CLINIC_OR_DEPARTMENT_OTHER): Payer: Medicare Other | Admitting: Oncology

## 2016-10-21 ENCOUNTER — Other Ambulatory Visit: Payer: Self-pay | Admitting: Oncology

## 2016-10-21 ENCOUNTER — Telehealth: Payer: Self-pay | Admitting: Oncology

## 2016-10-21 VITALS — BP 122/48 | HR 73 | Temp 98.1°F | Resp 18 | Ht 62.5 in | Wt 219.7 lb

## 2016-10-21 DIAGNOSIS — C50912 Malignant neoplasm of unspecified site of left female breast: Secondary | ICD-10-CM

## 2016-10-21 DIAGNOSIS — C787 Secondary malignant neoplasm of liver and intrahepatic bile duct: Secondary | ICD-10-CM

## 2016-10-21 DIAGNOSIS — Z23 Encounter for immunization: Secondary | ICD-10-CM | POA: Diagnosis not present

## 2016-10-21 DIAGNOSIS — C50919 Malignant neoplasm of unspecified site of unspecified female breast: Secondary | ICD-10-CM

## 2016-10-21 DIAGNOSIS — I10 Essential (primary) hypertension: Secondary | ICD-10-CM | POA: Diagnosis not present

## 2016-10-21 DIAGNOSIS — K1379 Other lesions of oral mucosa: Secondary | ICD-10-CM

## 2016-10-21 DIAGNOSIS — M069 Rheumatoid arthritis, unspecified: Secondary | ICD-10-CM | POA: Diagnosis not present

## 2016-10-21 DIAGNOSIS — C7951 Secondary malignant neoplasm of bone: Secondary | ICD-10-CM

## 2016-10-21 DIAGNOSIS — G893 Neoplasm related pain (acute) (chronic): Secondary | ICD-10-CM

## 2016-10-21 DIAGNOSIS — E119 Type 2 diabetes mellitus without complications: Secondary | ICD-10-CM | POA: Diagnosis not present

## 2016-10-21 LAB — CBC WITH DIFFERENTIAL/PLATELET
BASO%: 0 % (ref 0.0–2.0)
BASOS ABS: 0 10*3/uL (ref 0.0–0.1)
EOS ABS: 0.3 10*3/uL (ref 0.0–0.5)
EOS%: 4.4 % (ref 0.0–7.0)
HCT: 33.4 % — ABNORMAL LOW (ref 34.8–46.6)
HGB: 10.7 g/dL — ABNORMAL LOW (ref 11.6–15.9)
LYMPH%: 13.5 % — AB (ref 14.0–49.7)
MCH: 33.4 pg (ref 25.1–34.0)
MCHC: 32 g/dL (ref 31.5–36.0)
MCV: 104.4 fL — AB (ref 79.5–101.0)
MONO#: 0.6 10*3/uL (ref 0.1–0.9)
MONO%: 10.3 % (ref 0.0–14.0)
NEUT#: 4.1 10*3/uL (ref 1.5–6.5)
NEUT%: 71.8 % (ref 38.4–76.8)
PLATELETS: 125 10*3/uL — AB (ref 145–400)
RBC: 3.2 10*6/uL — AB (ref 3.70–5.45)
RDW: 15.2 % — ABNORMAL HIGH (ref 11.2–14.5)
WBC: 5.7 10*3/uL (ref 3.9–10.3)
lymph#: 0.8 10*3/uL — ABNORMAL LOW (ref 0.9–3.3)

## 2016-10-21 LAB — COMPREHENSIVE METABOLIC PANEL
ALBUMIN: 3.5 g/dL (ref 3.5–5.0)
ALK PHOS: 86 U/L (ref 40–150)
ALT: <6 U/L (ref 0–55)
ANION GAP: 11 meq/L (ref 3–11)
AST: 13 U/L (ref 5–34)
BILIRUBIN TOTAL: 0.26 mg/dL (ref 0.20–1.20)
BUN: 25.5 mg/dL (ref 7.0–26.0)
CO2: 25 meq/L (ref 22–29)
Calcium: 9.7 mg/dL (ref 8.4–10.4)
Chloride: 105 mEq/L (ref 98–109)
Creatinine: 1.4 mg/dL — ABNORMAL HIGH (ref 0.6–1.1)
EGFR: 39 mL/min/{1.73_m2} — AB (ref >90)
GLUCOSE: 77 mg/dL (ref 70–140)
POTASSIUM: 4.6 meq/L (ref 3.5–5.1)
SODIUM: 140 meq/L (ref 136–145)
Total Protein: 6.7 g/dL (ref 6.4–8.3)

## 2016-10-21 MED ORDER — INFLUENZA VAC SPLIT HIGH-DOSE 0.5 ML IM SUSY
0.5000 mL | PREFILLED_SYRINGE | Freq: Once | INTRAMUSCULAR | Status: AC
  Administered 2016-10-21: 0.5 mL via INTRAMUSCULAR
  Filled 2016-10-21: qty 0.5

## 2016-10-21 MED ORDER — CAPECITABINE 500 MG PO TABS: 1000.0000 mg | ORAL_TABLET | Freq: Two times a day (BID) | ORAL | 0 refills | Status: DC

## 2016-10-21 MED ORDER — MORPHINE SULFATE ER 15 MG PO TBCR: EXTENDED_RELEASE_TABLET | ORAL | 0 refills | Status: DC

## 2016-10-21 NOTE — Telephone Encounter (Signed)
Scheduled appt per 10/4 los-  Gave patient AVS and calender per los. Central radiology to contact patient with PET schedule.  

## 2016-10-21 NOTE — Progress Notes (Signed)
[facility] OFFICE PROGRESS NOTE   Diagnosis: Breast cancer  INTERVAL HISTORY:   [name] returns as scheduled. She continues Xeloda. She reports a few mouth ulcers that improved with Magic mouthwash. Stable erythema at the palms and soles. She continues to have pain, chiefly at the ribs and lower back. She takes 5-6 oxycodone tablets per day for breakthrough pain. No diarrhea.  Objective:  Vital signs in last 24 hours:  Blood pressure (!) 122/48, pulse 73, temperature 98.1 F (36.7 C), temperature source Oral, resp. rate 18, height 5' 2.5" (1.588 m), weight 219 lb 11.2 oz (99.7 kg), SpO2 98 %.    HEENT: No thrush or ulcers Resp: Lungs clear bilaterally Cardio: Regular rate and rhythm GI: No hepatomegaly, no mass Vascular: No leg edema  Skin: Mild erythema of the palms, erythema with skin thickening at the soles     Lab Results:  Lab Results  Component Value Date   WBC 5.7 10/21/2016   HGB 10.7 (L) 10/21/2016   HCT 33.4 (L) 10/21/2016   MCV 104.4 (H) 10/21/2016   PLT 125 (L) 10/21/2016   NEUTROABS 4.1 10/21/2016    CMP     Component Value Date/Time   NA 140 10/21/2016 0800   K 4.6 10/21/2016 0800   CL 103 03/25/2016 1600   CO2 25 10/21/2016 0800   GLUCOSE 77 10/21/2016 0800   BUN 25.5 10/21/2016 0800   CREATININE 1.4 (H) 10/21/2016 0800   CALCIUM 9.7 10/21/2016 0800   PROT 6.7 10/21/2016 0800   ALBUMIN 3.5 10/21/2016 0800   AST 13 10/21/2016 0800   ALT <6 10/21/2016 0800   ALKPHOS 86 10/21/2016 0800   BILITOT 0.26 10/21/2016 0800   GFRNONAA 39 (L) 03/25/2016 1600   GFRAA 45 (L) 03/25/2016 1600     Medications: I have reviewed the patient's current medications.  Assessment/Plan: 1. 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 ER positive, PR positive, HER-2 negative She completed 4 cycles of adjuvant AC  chemotherapy June 15 through September 03, 2008, and then completed the left chest wall radiation. She began Arimidex following an office visit on November 11, 2008.  Bone scan 06/01/2013 suggestive of thoracic spine metastases, thoracic MRI 06/27/2011 consistent with multiple bone metastases involving the thoracolumbar spine   PET scan 07/09/2013 with multiple hypermetabolic bone lesions and hypermetabolic mediastinal nodes.   Left iliac lesion biopsy 07/26/2013. Pathology showed metastatic carcinoma, ER positive, PR positive, HER-2 negative   Initiation of Faslodex 08/10/2013.  Xgeva every 3 months initiated 07/27/2013.  Restaging PET scan 01/23/2014 with no residual hypermetabolic mediastinal disease; marked improvement in the metastatic bone disease.  Restaging PET scan 02/17/2015 with recurrence of skeletal metastasis. New hypermetabolic lesions within the pelvis and ribs. No hypermetabolic lymphadenopathy.  Continuation of Faslodex; initiation of Ibrance 03/05/2015  Cycle 2 Ibrance 04/09/2015-dose reduced to 100 mg daily  Cycle 3 Ibrance 05/08/2015  Cycle 4 Ibrance 06/11/2015  Cycle 5 Ibrance 07/15/2015  PET scan 08/12/2015-increase in size and increased metabolic activity associated with bone metastases, no new lesions  Ibrance/Faslodex discontinued  Tamoxifen started 08/07/2015  PET scan 02/04/2016 with new hypermetabolic liver lesions and numerous new skeletal lesions.  Xeloda, 7 days on/7 days off initiated February 2018  Xeloda dose reduced secondary to hand/foot syndrome beginning on 05/04/2016  Restaging MRI liver 05/18/2016-2 dominant hepatic metastases measuring up to 13 mm favored to be improved.  Xeloda continued 2. Left chest subcutaneous mass noted on exam October 04, 2008, status post biopsy by Dr. Brantley Stage with a benign pathology. 3. History of delayed healing of the left mastectomy incision. 4. Rheumatoid  arthritis. 5. Diabetes. 6. Hypercholesterolemia. 7. Hypertension. 8. Family history of breast cancer. She has been evaluated at the genetic screening clinic. 9. 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 10. 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 Hand/foot syndrome secondary to Xeloda-Xeloda dose reduced 05/05/2015  Disposition:  Her overall status appears unchanged. She will continue the current narcotic regimen. I recommend she discontinue folic acid and she will continue Xeloda at the current dose.  [name] will undergo a restaging PET scan prior to an office visit in one month.  She will receive xgeva when she returns next month. She received an influenza vaccine today.   Donneta Romberg, MD  10/21/2016  8:58 AM

## 2016-10-22 LAB — CANCER ANTIGEN 27.29: CA 27.29: 20.6 U/mL (ref 0.0–38.6)

## 2016-10-22 MED FILL — ALPRAZolam 0.5 MG TABS: 0.5 | 23 days supply | Qty: 90 | Fill #0

## 2016-10-25 ENCOUNTER — Telehealth: Payer: Self-pay | Admitting: Oncology

## 2016-10-25 MED FILL — XELODA 500 MG TABLET: 500 | 28 days supply | Qty: 56 | Fill #0

## 2016-10-25 MED FILL — MORPHINE SULF ER 15 MG TAB: 15 | 30 days supply | Qty: 90 | Fill #0

## 2016-10-25 NOTE — Telephone Encounter (Signed)
10/21/16 prescription refill.  Prescription scanned in media . °

## 2016-11-04 ENCOUNTER — Other Ambulatory Visit: Payer: Self-pay | Admitting: Rheumatology

## 2016-11-05 NOTE — Telephone Encounter (Signed)
Last visit: 08/12/16 Next visit: 01/24/17 Patient states her current dose is 1mg  every other day.  Ok to refill per Dr. Estanislado Pandy.

## 2016-11-09 ENCOUNTER — Other Ambulatory Visit: Payer: Self-pay | Admitting: *Deleted

## 2016-11-09 DIAGNOSIS — C7951 Secondary malignant neoplasm of bone: Principal | ICD-10-CM

## 2016-11-09 DIAGNOSIS — C50919 Malignant neoplasm of unspecified site of unspecified female breast: Secondary | ICD-10-CM

## 2016-11-09 MED ORDER — OXYCODONE-ACETAMINOPHEN 10-325 MG PO TABS: 1.0000 | ORAL_TABLET | ORAL | 0 refills | Status: DC | PRN

## 2016-11-09 NOTE — Telephone Encounter (Signed)
Patient left vm message requesting refill of percocet.  Script prepared and Patient called to pick-up

## 2016-11-10 MED FILL — OXYCODONE-APAP 10-325 MG TA: 10-325 | 30 days supply | Qty: 180 | Fill #0

## 2016-11-12 ENCOUNTER — Ambulatory Visit (HOSPITAL_COMMUNITY)
Admission: RE | Admit: 2016-11-12 | Discharge: 2016-11-12 | Disposition: A | Discharge status: Home / Self Care | Payer: Medicare Other | Source: Ambulatory Visit | Attending: Oncology | Admitting: Oncology

## 2016-11-12 DIAGNOSIS — C50912 Malignant neoplasm of unspecified site of left female breast: Secondary | ICD-10-CM | POA: Diagnosis not present

## 2016-11-12 DIAGNOSIS — C7951 Secondary malignant neoplasm of bone: Secondary | ICD-10-CM | POA: Insufficient documentation

## 2016-11-12 DIAGNOSIS — K5730 Diverticulosis of large intestine without perforation or abscess without bleeding: Secondary | ICD-10-CM | POA: Diagnosis not present

## 2016-11-12 DIAGNOSIS — I2510 Atherosclerotic heart disease of native coronary artery without angina pectoris: Secondary | ICD-10-CM | POA: Insufficient documentation

## 2016-11-12 DIAGNOSIS — D259 Leiomyoma of uterus, unspecified: Secondary | ICD-10-CM | POA: Insufficient documentation

## 2016-11-12 DIAGNOSIS — E079 Disorder of thyroid, unspecified: Secondary | ICD-10-CM | POA: Insufficient documentation

## 2016-11-12 DIAGNOSIS — I700 Atherosclerosis of aorta: Secondary | ICD-10-CM | POA: Diagnosis not present

## 2016-11-12 LAB — GLUCOSE, CAPILLARY: GLUCOSE-CAPILLARY: 100 mg/dL — AB (ref 65–99)

## 2016-11-12 MED ORDER — FLUDEOXYGLUCOSE F - 18 (FDG) INJECTION
10.9000 | Freq: Once | INTRAVENOUS | Status: AC | PRN
  Administered 2016-11-12: 10.9 via INTRAVENOUS

## 2016-11-15 ENCOUNTER — Telehealth: Payer: Self-pay | Admitting: *Deleted

## 2016-11-15 NOTE — Telephone Encounter (Signed)
-----   Message from Ladell Pier, MD sent at 11/15/2016  9:11 AM EDT ----- Please call patient PET scan looks much better in liver and bones, f/u as scheduled

## 2016-11-15 NOTE — Telephone Encounter (Signed)
Pt notified of scan result, per MD note below. She voiced appreciation for call. Next appt confirmed.

## 2016-11-16 ENCOUNTER — Other Ambulatory Visit: Payer: Self-pay | Admitting: Oncology

## 2016-11-16 DIAGNOSIS — C7951 Secondary malignant neoplasm of bone: Principal | ICD-10-CM

## 2016-11-16 DIAGNOSIS — C50919 Malignant neoplasm of unspecified site of unspecified female breast: Secondary | ICD-10-CM

## 2016-11-18 ENCOUNTER — Ambulatory Visit (HOSPITAL_BASED_OUTPATIENT_CLINIC_OR_DEPARTMENT_OTHER): Payer: Medicare Other

## 2016-11-18 ENCOUNTER — Telehealth: Payer: Self-pay | Admitting: Oncology

## 2016-11-18 ENCOUNTER — Other Ambulatory Visit (HOSPITAL_BASED_OUTPATIENT_CLINIC_OR_DEPARTMENT_OTHER): Payer: Medicare Other

## 2016-11-18 ENCOUNTER — Ambulatory Visit (HOSPITAL_BASED_OUTPATIENT_CLINIC_OR_DEPARTMENT_OTHER): Payer: Medicare Other | Admitting: Nurse Practitioner

## 2016-11-18 ENCOUNTER — Other Ambulatory Visit: Payer: Self-pay | Admitting: Emergency Medicine

## 2016-11-18 DIAGNOSIS — C787 Secondary malignant neoplasm of liver and intrahepatic bile duct: Secondary | ICD-10-CM

## 2016-11-18 DIAGNOSIS — C50919 Malignant neoplasm of unspecified site of unspecified female breast: Secondary | ICD-10-CM

## 2016-11-18 DIAGNOSIS — I10 Essential (primary) hypertension: Secondary | ICD-10-CM

## 2016-11-18 DIAGNOSIS — G893 Neoplasm related pain (acute) (chronic): Secondary | ICD-10-CM | POA: Diagnosis not present

## 2016-11-18 DIAGNOSIS — C7951 Secondary malignant neoplasm of bone: Secondary | ICD-10-CM | POA: Diagnosis not present

## 2016-11-18 DIAGNOSIS — E119 Type 2 diabetes mellitus without complications: Secondary | ICD-10-CM | POA: Diagnosis not present

## 2016-11-18 DIAGNOSIS — R110 Nausea: Secondary | ICD-10-CM

## 2016-11-18 DIAGNOSIS — C50912 Malignant neoplasm of unspecified site of left female breast: Secondary | ICD-10-CM

## 2016-11-18 LAB — CBC WITH DIFFERENTIAL/PLATELET
BASO%: 0.2 % (ref 0.0–2.0)
BASOS ABS: 0 10*3/uL (ref 0.0–0.1)
EOS ABS: 0.2 10*3/uL (ref 0.0–0.5)
EOS%: 3.1 % (ref 0.0–7.0)
HCT: 33 % — ABNORMAL LOW (ref 34.8–46.6)
HEMOGLOBIN: 10.9 g/dL — AB (ref 11.6–15.9)
LYMPH%: 7 % — AB (ref 14.0–49.7)
MCH: 33.6 pg (ref 25.1–34.0)
MCHC: 32.9 g/dL (ref 31.5–36.0)
MCV: 102 fL — ABNORMAL HIGH (ref 79.5–101.0)
MONO#: 0.6 10*3/uL (ref 0.1–0.9)
MONO%: 8.3 % (ref 0.0–14.0)
NEUT#: 5.6 10*3/uL (ref 1.5–6.5)
NEUT%: 81.4 % — AB (ref 38.4–76.8)
Platelets: 154 10*3/uL (ref 145–400)
RBC: 3.23 10*6/uL — ABNORMAL LOW (ref 3.70–5.45)
RDW: 16.3 % — AB (ref 11.2–14.5)
WBC: 6.9 10*3/uL (ref 3.9–10.3)
lymph#: 0.5 10*3/uL — ABNORMAL LOW (ref 0.9–3.3)

## 2016-11-18 LAB — COMPREHENSIVE METABOLIC PANEL
ALBUMIN: 3.5 g/dL (ref 3.5–5.0)
ALK PHOS: 93 U/L (ref 40–150)
ALT: 9 U/L (ref 0–55)
AST: 15 U/L (ref 5–34)
Anion Gap: 11 mEq/L (ref 3–11)
BILIRUBIN TOTAL: 0.22 mg/dL (ref 0.20–1.20)
BUN: 26.1 mg/dL — AB (ref 7.0–26.0)
CO2: 20 mEq/L — ABNORMAL LOW (ref 22–29)
Calcium: 8.4 mg/dL (ref 8.4–10.4)
Chloride: 108 mEq/L (ref 98–109)
Creatinine: 1.4 mg/dL — ABNORMAL HIGH (ref 0.6–1.1)
EGFR: 41 mL/min/{1.73_m2} — ABNORMAL LOW (ref >60)
GLUCOSE: 105 mg/dL (ref 70–140)
POTASSIUM: 4.4 meq/L (ref 3.5–5.1)
SODIUM: 140 meq/L (ref 136–145)
TOTAL PROTEIN: 6.7 g/dL (ref 6.4–8.3)

## 2016-11-18 MED ORDER — MORPHINE SULFATE ER 15 MG PO TBCR: EXTENDED_RELEASE_TABLET | ORAL | 0 refills | Status: DC

## 2016-11-18 MED ORDER — DENOSUMAB 120 MG/1.7ML ~~LOC~~ SOLN
120.0000 mg | Freq: Once | SUBCUTANEOUS | Status: AC
  Administered 2016-11-18: 120 mg via SUBCUTANEOUS
  Filled 2016-11-18: qty 1.7

## 2016-11-18 MED ORDER — ALPRAZOLAM 0.5 MG PO TABS: ORAL_TABLET | ORAL | 0 refills | Status: DC

## 2016-11-18 MED ORDER — CAPECITABINE 500 MG PO TABS: 1000.0000 mg | ORAL_TABLET | Freq: Two times a day (BID) | ORAL | 0 refills | Status: DC

## 2016-11-18 MED FILL — XELODA 500 MG TABLET: 500 | 21 days supply | Qty: 56 | Fill #0

## 2016-11-18 NOTE — Progress Notes (Addendum)
[place] OFFICE PROGRESS NOTE   Diagnosis:  Breast cancer  INTERVAL HISTORY:   [name] returns as scheduled. She continues Xeloda. She has intermittent nausea. No mouth sores. No diarrhea. She notes dryness of the hands and feet. No pain or skin breakdown. She continues to have rib pain. Upper back pain is better.  Objective:  Vital signs in last 24 hours:  Blood pressure (!) 141/71, pulse 83, temperature 98.3 F (36.8 C), resp. rate 17, height 5' 2.5" (1.588 m), weight 219 lb 11.2 oz (99.7 kg), SpO2 97 %.    HEENT: no thrush or ulcers. Resp: lungs clear bilaterally. Cardio: regular rate and rhythm. GI: abdomen soft and nontender. No hepatomegaly. Vascular: no leg edema. Skin: palms with mild erythema. No skin breakdown.    Lab Results:  Lab Results  Component Value Date   WBC 6.9 11/18/2016   HGB 10.9 (L) 11/18/2016   HCT 33.0 (L) 11/18/2016   MCV 102.0 (H) 11/18/2016   PLT 154 11/18/2016   NEUTROABS 5.6 11/18/2016    Imaging:  No results found.  Medications: I have reviewed the patient's current medications.  Assessment/Plan: 1. 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 ER positive, PR positive, HER-2 negative She completed 4 cycles of adjuvant AC chemotherapy June 15 through September 03, 2008, and then completed the left chest wall radiation. She began Arimidex following an office visit on November 11, 2008.  Bone scan 06/01/2013 suggestive of thoracic spine metastases, thoracic MRI 06/27/2011 consistent with multiple bone metastases involving the thoracolumbar spine   PET scan 07/09/2013 with multiple hypermetabolic bone lesions and hypermetabolic mediastinal nodes.   Left iliac lesion biopsy 07/26/2013. Pathology showed metastatic carcinoma, ER positive, PR positive, HER-2 negative   Initiation of  Faslodex 08/10/2013.  Xgeva every 3 months initiated 07/27/2013.  Restaging PET scan 01/23/2014 with no residual hypermetabolic mediastinal disease; marked improvement in the metastatic bone disease.  Restaging PET scan 02/17/2015 with recurrence of skeletal metastasis. New hypermetabolic lesions within the pelvis and ribs. No hypermetabolic lymphadenopathy.  Continuation of Faslodex; initiation of Ibrance 03/05/2015  Cycle 2 Ibrance 04/09/2015-dose reduced to 100 mg daily  Cycle 3 Ibrance 05/08/2015  Cycle 4 Ibrance 06/11/2015  Cycle 5 Ibrance 07/15/2015  PET scan 08/12/2015-increase in size and increased metabolic activity associated with bone metastases, no new lesions  Ibrance/Faslodex discontinued  Tamoxifen started 08/07/2015  PET scan 02/04/2016 with new hypermetabolic liver lesions and numerous new skeletal lesions.  Xeloda, 7 days on/7 days off initiated February 2018  Xeloda dose reduced secondary to hand/foot syndrome beginning on 05/04/2016  Restaging MRI liver 05/18/2016-2 dominant hepatic metastases measuring up to 13 mm favored to be improved.  Xeloda continued  PET scan 11/12/2016-significant improvement with complete resolution of multiple hepatic hypermetabolic lesions and most of the prior bony hypermetabolic lesions. The remaining hypermetabolic hepatic and bony lesions are smaller and/or demonstrate significantly reduced activity.  Xeloda continued 2. Left chest subcutaneous mass noted on exam October 04, 2008, status post biopsy by Dr. Brantley Stage with a benign pathology. 3. History of delayed healing of the left mastectomy incision. 4. Rheumatoid arthritis. 5. Diabetes. 6. Hypercholesterolemia. 7. Hypertension. 8. Family history of breast cancer. She has been evaluated at the genetic screening clinic. 9. Pain secondary to metastatic breast cancer involving the bones, status post palliative radiation to the upper cervical spine, lower thoracic spine,  left ileum, left  hip/femur completed 08/17/2013. 10. 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 Hand/foot syndrome secondary to Xeloda-Xeloda dose reduced 05/05/2015    Disposition: [name] appears unchanged. The recent restaging PET scan shows significant improvement. Images were reviewed on the computer with Ms. Shelley and her husband at today's visit. The plan is to continue Xeloda. She receives [place] every 3 months and will receive an injection today. She will return for labs and a follow-up visit in 4 weeks. She will contact the office in the interim with any problems.  Patient seen with Dr. Benay Spice.    [name] ANP/GNP-BC   11/18/2016  10:07 AM This was insured visit with [name].  [name] appears unchanged.  We reviewed the restaging PET images with her.  There is x-ray evidence of a response to Xeloda.  The plan is to continue Xeloda on the current schedule.  [name], MD

## 2016-11-18 NOTE — Telephone Encounter (Signed)
Gave avs and calendar for November  °

## 2016-11-18 NOTE — Patient Instructions (Signed)

## 2016-11-19 LAB — CANCER ANTIGEN 27.29: CAN 27.29: 27.6 U/mL (ref 0.0–38.6)

## 2016-11-25 ENCOUNTER — Other Ambulatory Visit: Payer: Self-pay | Admitting: Nurse Practitioner

## 2016-11-25 MED FILL — MORPHINE SULF ER 15 MG TAB: 15 | 30 days supply | Qty: 90 | Fill #0

## 2016-11-25 MED FILL — ALPRAZolam 0.5 MG TABS: 0.5 | 23 days supply | Qty: 90 | Fill #0

## 2016-12-07 ENCOUNTER — Other Ambulatory Visit: Payer: Self-pay | Admitting: *Deleted

## 2016-12-07 DIAGNOSIS — C50919 Malignant neoplasm of unspecified site of unspecified female breast: Secondary | ICD-10-CM

## 2016-12-07 DIAGNOSIS — C7951 Secondary malignant neoplasm of bone: Principal | ICD-10-CM

## 2016-12-07 MED ORDER — OXYCODONE-ACETAMINOPHEN 10-325 MG PO TABS: 1.0000 | ORAL_TABLET | ORAL | 0 refills | Status: DC | PRN

## 2016-12-08 MED FILL — OXYCODONE-APAP 10-325 MG TA: 10-325 | 30 days supply | Qty: 180 | Fill #0

## 2016-12-10 ENCOUNTER — Other Ambulatory Visit: Payer: Self-pay | Admitting: Oncology

## 2016-12-10 DIAGNOSIS — C50919 Malignant neoplasm of unspecified site of unspecified female breast: Secondary | ICD-10-CM

## 2016-12-10 DIAGNOSIS — C7951 Secondary malignant neoplasm of bone: Principal | ICD-10-CM

## 2016-12-13 MED FILL — XELODA 500 MG TABLET: 500 | 21 days supply | Qty: 56 | Fill #0

## 2016-12-16 ENCOUNTER — Other Ambulatory Visit: Payer: Medicare Other

## 2016-12-16 ENCOUNTER — Ambulatory Visit: Payer: Medicare Other | Admitting: Nurse Practitioner

## 2016-12-17 ENCOUNTER — Telehealth: Payer: Self-pay | Admitting: Oncology

## 2016-12-17 ENCOUNTER — Other Ambulatory Visit (HOSPITAL_BASED_OUTPATIENT_CLINIC_OR_DEPARTMENT_OTHER): Payer: Medicare Other

## 2016-12-17 ENCOUNTER — Encounter: Payer: Self-pay | Admitting: Oncology

## 2016-12-17 ENCOUNTER — Ambulatory Visit (HOSPITAL_BASED_OUTPATIENT_CLINIC_OR_DEPARTMENT_OTHER): Payer: Medicare Other | Admitting: Oncology

## 2016-12-17 VITALS — BP 131/65 | HR 89 | Temp 98.0°F | Resp 18 | Ht 62.5 in | Wt 215.6 lb

## 2016-12-17 DIAGNOSIS — E119 Type 2 diabetes mellitus without complications: Secondary | ICD-10-CM

## 2016-12-17 DIAGNOSIS — I10 Essential (primary) hypertension: Secondary | ICD-10-CM

## 2016-12-17 DIAGNOSIS — G893 Neoplasm related pain (acute) (chronic): Secondary | ICD-10-CM | POA: Diagnosis not present

## 2016-12-17 DIAGNOSIS — C50919 Malignant neoplasm of unspecified site of unspecified female breast: Secondary | ICD-10-CM | POA: Diagnosis not present

## 2016-12-17 DIAGNOSIS — C7951 Secondary malignant neoplasm of bone: Secondary | ICD-10-CM

## 2016-12-17 DIAGNOSIS — M069 Rheumatoid arthritis, unspecified: Secondary | ICD-10-CM

## 2016-12-17 DIAGNOSIS — C787 Secondary malignant neoplasm of liver and intrahepatic bile duct: Secondary | ICD-10-CM

## 2016-12-17 DIAGNOSIS — C50912 Malignant neoplasm of unspecified site of left female breast: Secondary | ICD-10-CM

## 2016-12-17 LAB — CBC WITH DIFFERENTIAL/PLATELET
BASO%: 0 % (ref 0.0–2.0)
Basophils Absolute: 0 10*3/uL (ref 0.0–0.1)
EOS%: 3 % (ref 0.0–7.0)
Eosinophils Absolute: 0.2 10*3/uL (ref 0.0–0.5)
HEMATOCRIT: 35.9 % (ref 34.8–46.6)
HEMOGLOBIN: 11.4 g/dL — AB (ref 11.6–15.9)
LYMPH#: 0.5 10*3/uL — AB (ref 0.9–3.3)
LYMPH%: 8 % — ABNORMAL LOW (ref 14.0–49.7)
MCH: 32.9 pg (ref 25.1–34.0)
MCHC: 31.8 g/dL (ref 31.5–36.0)
MCV: 103.8 fL — ABNORMAL HIGH (ref 79.5–101.0)
MONO#: 0.6 10*3/uL (ref 0.1–0.9)
MONO%: 8.7 % (ref 0.0–14.0)
NEUT%: 80.3 % — AB (ref 38.4–76.8)
NEUTROS ABS: 5.4 10*3/uL (ref 1.5–6.5)
PLATELETS: 149 10*3/uL (ref 145–400)
RBC: 3.46 10*6/uL — ABNORMAL LOW (ref 3.70–5.45)
RDW: 14.6 % — AB (ref 11.2–14.5)
WBC: 6.8 10*3/uL (ref 3.9–10.3)

## 2016-12-17 LAB — COMPREHENSIVE METABOLIC PANEL
ALBUMIN: 3.7 g/dL (ref 3.5–5.0)
ALK PHOS: 86 U/L (ref 40–150)
ALT: 8 U/L (ref 0–55)
ANION GAP: 10 meq/L (ref 3–11)
AST: 18 U/L (ref 5–34)
BUN: 24.8 mg/dL (ref 7.0–26.0)
CO2: 25 mEq/L (ref 22–29)
Calcium: 9.1 mg/dL (ref 8.4–10.4)
Chloride: 103 mEq/L (ref 98–109)
Creatinine: 1.5 mg/dL — ABNORMAL HIGH (ref 0.6–1.1)
EGFR: 37 mL/min/{1.73_m2} — AB (ref >60)
GLUCOSE: 119 mg/dL (ref 70–140)
POTASSIUM: 4.3 meq/L (ref 3.5–5.1)
SODIUM: 138 meq/L (ref 136–145)
Total Bilirubin: 0.39 mg/dL (ref 0.20–1.20)
Total Protein: 7.2 g/dL (ref 6.4–8.3)

## 2016-12-17 MED ORDER — MORPHINE SULFATE ER 15 MG PO TBCR: EXTENDED_RELEASE_TABLET | ORAL | 0 refills | Status: DC

## 2016-12-17 MED ORDER — ALPRAZOLAM 0.5 MG PO TABS: ORAL_TABLET | ORAL | 0 refills | Status: DC

## 2016-12-17 NOTE — Progress Notes (Signed)
[facility] OFFICE PROGRESS NOTE   Diagnosis: Breast cancer  INTERVAL HISTORY:   [name] returns as scheduled.  She continues Xeloda.  Discomfort in the hands when she holds something hot.  No mouth sores or diarrhea.  She continues to have pain at multiple sites, chiefly the back.  She is taking MS Contin and oxycodone.  She reports not feeling well this week.  She wakes up during the night with pain and moves to a chair.  Objective:  Vital signs in last 24 hours:  Blood pressure 131/65, pulse 89, temperature 98 F (36.7 C), temperature source Oral, resp. rate 18, height 5' 2.5" (1.588 m), weight 215 lb 9.6 oz (97.8 kg), SpO2 99 %.    HEENT: No thrush or ulcers Resp: Lungs clear bilaterally Cardio: Regular rate and rhythm GI: No hepatosplenomegaly Vascular: No leg edema  Skin: Mild erythema at the distal fingers, callus formation at the soles.    Lab Results:  Lab Results  Component Value Date   WBC 6.8 12/17/2016   HGB 11.4 (L) 12/17/2016   HCT 35.9 12/17/2016   MCV 103.8 (H) 12/17/2016   PLT 149 12/17/2016   NEUTROABS 5.4 12/17/2016    CMP     Component Value Date/Time   NA 138 12/17/2016 1025   K 4.3 12/17/2016 1025   CL 103 03/25/2016 1600   CO2 25 12/17/2016 1025   GLUCOSE 119 12/17/2016 1025   BUN 24.8 12/17/2016 1025   CREATININE 1.5 (H) 12/17/2016 1025   CALCIUM 9.1 12/17/2016 1025   PROT 7.2 12/17/2016 1025   ALBUMIN 3.7 12/17/2016 1025   AST 18 12/17/2016 1025   ALT 8 12/17/2016 1025   ALKPHOS 86 12/17/2016 1025   BILITOT 0.39 12/17/2016 1025   GFRNONAA 39 (L) 03/25/2016 1600   GFRAA 45 (L) 03/25/2016 1600     Medications: I have reviewed the patient's current medications.  Assessment/Plan: 1. 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 ER positive, PR positive, HER-2 negative She completed 4  cycles of adjuvant AC chemotherapy June 15 through September 03, 2008, and then completed the left chest wall radiation. She began Arimidex following an office visit on November 11, 2008.  Bone scan 06/01/2013 suggestive of thoracic spine metastases, thoracic MRI 06/27/2011 consistent with multiple bone metastases involving the thoracolumbar spine   PET scan 07/09/2013 with multiple hypermetabolic bone lesions and hypermetabolic mediastinal nodes.   Left iliac lesion biopsy 07/26/2013. Pathology showed metastatic carcinoma, ER positive, PR positive, HER-2 negative   Initiation of Faslodex 08/10/2013.  Xgeva every 3 months initiated 07/27/2013.  Restaging PET scan 01/23/2014 with no residual hypermetabolic mediastinal disease; marked improvement in the metastatic bone disease.  Restaging PET scan 02/17/2015 with recurrence of skeletal metastasis. New hypermetabolic lesions within the pelvis and ribs. No hypermetabolic lymphadenopathy.  Continuation of Faslodex; initiation of Ibrance 03/05/2015  Cycle 2 Ibrance 04/09/2015-dose reduced to 100 mg daily  Cycle 3 Ibrance 05/08/2015  Cycle 4 Ibrance 06/11/2015  Cycle 5 Ibrance 07/15/2015  PET scan 08/12/2015-increase in size and increased metabolic activity associated with bone metastases, no new lesions  Ibrance/Faslodex discontinued  Tamoxifen started 08/07/2015  PET scan 02/04/2016 with new hypermetabolic liver lesions and numerous new skeletal lesions.  Xeloda, 7 days on/7 days off initiated February 2018  Xeloda dose reduced secondary to hand/foot syndrome beginning on 05/04/2016  Restaging MRI liver 05/18/2016-2 dominant hepatic  metastases measuring up to 13 mm favored to be improved.  Xeloda continued  PET scan 11/12/2016-significant improvement with complete resolution of multiple hepatic hypermetabolic lesions and most of the prior bony hypermetabolic lesions. The remaining hypermetabolic hepatic and bony lesions are  smaller and/or demonstrate significantly reduced activity.  Xeloda continued 2. Left chest subcutaneous mass noted on exam October 04, 2008, status post biopsy by Dr. Brantley Stage with a benign pathology. 3. History of delayed healing of the left mastectomy incision. 4. Rheumatoid arthritis. 5. Diabetes. 6. Hypercholesterolemia. 7. Hypertension. 8. Family history of breast cancer. She has been evaluated at the genetic screening clinic. 9. 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 10. 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 Hand/foot syndrome secondary to Xeloda-Xeloda dose reduced 04/17/201  Disposition:  [name] appears unchanged.  She will continue Xeloda.  She will receive Xgeva in 2 months. She will try taking the evening dose of MS Contin earlier with the hope this will allow her to sleep through the night. She will return for an office of and lab visit on 01/12/2017.  15 minutes were spent with the patient today.  The majority of the time was used for counseling and coordination of care.  Betsy Coder, MD  12/17/2016  12:17 PM

## 2016-12-17 NOTE — Telephone Encounter (Signed)
Scheduled appt per 11/30 los - Gave patient aVS and calender per los.

## 2016-12-17 NOTE — Addendum Note (Signed)
Addended by: Rosalio Macadamia C on: 12/17/2016 12:57 PM   Modules accepted: Orders

## 2016-12-18 LAB — CANCER ANTIGEN 27.29: CA 27.29: 33.9 U/mL (ref 0.0–38.6)

## 2016-12-22 MED FILL — ALPRAZolam 0.5 MG TABS: 0.5 | 23 days supply | Qty: 90 | Fill #0

## 2016-12-23 MED FILL — MORPHINE SULF ER 15 MG TAB: 15 | 30 days supply | Qty: 90 | Fill #0

## 2017-01-04 ENCOUNTER — Other Ambulatory Visit: Payer: Self-pay | Admitting: Oncology

## 2017-01-04 DIAGNOSIS — C7951 Secondary malignant neoplasm of bone: Principal | ICD-10-CM

## 2017-01-04 DIAGNOSIS — C50919 Malignant neoplasm of unspecified site of unspecified female breast: Secondary | ICD-10-CM

## 2017-01-10 MED FILL — XELODA 500 MG TABLET: 500 | 21 days supply | Qty: 56 | Fill #0

## 2017-01-12 ENCOUNTER — Telehealth: Payer: Self-pay | Admitting: Oncology

## 2017-01-12 ENCOUNTER — Other Ambulatory Visit (HOSPITAL_BASED_OUTPATIENT_CLINIC_OR_DEPARTMENT_OTHER): Payer: Medicare Other

## 2017-01-12 ENCOUNTER — Other Ambulatory Visit: Payer: Self-pay

## 2017-01-12 ENCOUNTER — Ambulatory Visit (HOSPITAL_BASED_OUTPATIENT_CLINIC_OR_DEPARTMENT_OTHER): Payer: Medicare Other | Admitting: Oncology

## 2017-01-12 VITALS — BP 124/53 | HR 84 | Resp 18 | Ht 62.5 in | Wt 212.2 lb

## 2017-01-12 DIAGNOSIS — E119 Type 2 diabetes mellitus without complications: Secondary | ICD-10-CM

## 2017-01-12 DIAGNOSIS — C787 Secondary malignant neoplasm of liver and intrahepatic bile duct: Secondary | ICD-10-CM

## 2017-01-12 DIAGNOSIS — C50919 Malignant neoplasm of unspecified site of unspecified female breast: Secondary | ICD-10-CM

## 2017-01-12 DIAGNOSIS — C7951 Secondary malignant neoplasm of bone: Principal | ICD-10-CM

## 2017-01-12 DIAGNOSIS — I10 Essential (primary) hypertension: Secondary | ICD-10-CM

## 2017-01-12 DIAGNOSIS — C50912 Malignant neoplasm of unspecified site of left female breast: Secondary | ICD-10-CM

## 2017-01-12 DIAGNOSIS — M545 Low back pain: Secondary | ICD-10-CM | POA: Diagnosis not present

## 2017-01-12 DIAGNOSIS — M069 Rheumatoid arthritis, unspecified: Secondary | ICD-10-CM

## 2017-01-12 LAB — CBC WITH DIFFERENTIAL/PLATELET
BASO%: 0 % (ref 0.0–2.0)
Basophils Absolute: 0 10*3/uL (ref 0.0–0.1)
EOS%: 4 % (ref 0.0–7.0)
Eosinophils Absolute: 0.2 10*3/uL (ref 0.0–0.5)
HCT: 36.5 % (ref 34.8–46.6)
HEMOGLOBIN: 11.5 g/dL — AB (ref 11.6–15.9)
LYMPH#: 0.6 10*3/uL — AB (ref 0.9–3.3)
LYMPH%: 10.4 % — AB (ref 14.0–49.7)
MCH: 32.6 pg (ref 25.1–34.0)
MCHC: 31.5 g/dL (ref 31.5–36.0)
MCV: 103.4 fL — ABNORMAL HIGH (ref 79.5–101.0)
MONO#: 0.4 10*3/uL (ref 0.1–0.9)
MONO%: 8.1 % (ref 0.0–14.0)
NEUT%: 77.5 % — ABNORMAL HIGH (ref 38.4–76.8)
NEUTROS ABS: 4.1 10*3/uL (ref 1.5–6.5)
PLATELETS: 125 10*3/uL — AB (ref 145–400)
RBC: 3.53 10*6/uL — AB (ref 3.70–5.45)
RDW: 15.1 % — AB (ref 11.2–14.5)
WBC: 5.3 10*3/uL (ref 3.9–10.3)

## 2017-01-12 LAB — COMPREHENSIVE METABOLIC PANEL
ALBUMIN: 3.6 g/dL (ref 3.5–5.0)
ALT: 8 U/L (ref 0–55)
ANION GAP: 11 meq/L (ref 3–11)
AST: 16 U/L (ref 5–34)
Alkaline Phosphatase: 106 U/L (ref 40–150)
BILIRUBIN TOTAL: 0.31 mg/dL (ref 0.20–1.20)
BUN: 40.3 mg/dL — ABNORMAL HIGH (ref 7.0–26.0)
CO2: 21 meq/L — AB (ref 22–29)
CREATININE: 1.6 mg/dL — AB (ref 0.6–1.1)
Calcium: 9.1 mg/dL (ref 8.4–10.4)
Chloride: 106 mEq/L (ref 98–109)
EGFR: 35 mL/min/{1.73_m2} — ABNORMAL LOW (ref >60)
GLUCOSE: 115 mg/dL (ref 70–140)
Potassium: 4.9 mEq/L (ref 3.5–5.1)
SODIUM: 138 meq/L (ref 136–145)
TOTAL PROTEIN: 7.2 g/dL (ref 6.4–8.3)

## 2017-01-12 MED ORDER — MORPHINE SULFATE ER 30 MG PO TBCR: EXTENDED_RELEASE_TABLET | ORAL | 0 refills | Status: DC

## 2017-01-12 MED ORDER — OXYCODONE-ACETAMINOPHEN 10-325 MG PO TABS: 1.0000 | ORAL_TABLET | ORAL | 0 refills | Status: DC | PRN

## 2017-01-12 MED FILL — OXYCODONE-APAP 10-325 MG TA: 10-325 | 30 days supply | Qty: 180 | Fill #0

## 2017-01-12 MED FILL — MORPHINE SULF ER 30 MG TAB: 30 | 30 days supply | Qty: 60 | Fill #0

## 2017-01-12 NOTE — Progress Notes (Deleted)
Office Visit Note  Patient: [name]             Date of Birth: [birth year]           MRN: [id number]             PCP: Aletha Halim., PA-C Referring: Aletha Halim., PA-C Visit Date: 01/24/2017 Occupation: @GUAROCC @    Subjective:  No chief complaint on file.   History of Present Illness: Roux V Chretien is a 66 y.o. female ***   Activities of Daily Living:  Patient reports morning stiffness for *** {minute/hour:19697}.   Patient {ACTIONS;DENIES/REPORTS:21021675::"Denies"} nocturnal pain.  Difficulty dressing/grooming: {ACTIONS;DENIES/REPORTS:21021675::"Denies"} Difficulty climbing stairs: {ACTIONS;DENIES/REPORTS:21021675::"Denies"} Difficulty getting out of chair: {ACTIONS;DENIES/REPORTS:21021675::"Denies"} Difficulty using hands for taps, buttons, cutlery, and/or writing: {ACTIONS;DENIES/REPORTS:21021675::"Denies"}   No Rheumatology ROS completed.   PMFS History:  Patient Active Problem List   Diagnosis Date Noted  . Other fatigue 04/26/2016  . Seropositive rheumatoid arthritis of multiple sites ([place]) 11/22/2015  . 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 High risk medication use 11/22/2015  . Low kidney function 11/22/2015  . Breast cancer metastasized to bone ([place]) 07/27/2013  . History of breast cancer 02/04/2012  . Breast cancer ([place]) 01/04/2012    Past Medical History:  Diagnosis Date  . Arthritis   . Bone metastases ([place])    breast primary  . Breast cancer ([place])   . Cancer ([place])    left breast  . Diabetes mellitus   . History of radiation therapy 10/2008   left chest wall, left supraclavicular region, mastectomy scar  . Hyperlipidemia   . Hypertension     Family History  Problem Relation Age of Onset  . Diabetes Mother   . Hypertension Mother   . Heart disease Mother   . Diabetes Father   . Hypertension Father   . Heart disease Father   . Cancer Sister        breast   Past Surgical History:  Procedure Laterality Date  .  BONE DENSITY TEST  2008  . BREAST SURGERY    . CHOLECYSTECTOMY    . COLONOSCOPY  2007  . MASTECTOMY  2010   LEFT BREAST  . PORT-A-CATH REMOVAL    . PORTACATH PLACEMENT    . REPLACEMENT TOTAL KNEE  2008   RIGHT KNEE  . TOTAL HIP ARTHROPLASTY  2004   RIGHT HIP   Social History   Social History Narrative  . Not on file     Objective: Vital Signs: There were no vitals taken for this visit.   Physical Exam   Musculoskeletal Exam: ***  CDAI Exam: No CDAI exam completed.    Investigation: No additional findings.PLQ eye exam: 07/05/2016 CBC Latest Ref Rng & Units 01/12/2017 12/17/2016 11/18/2016  WBC 3.9 - 10.3 10e3/uL 5.3 6.8 6.9  Hemoglobin 11.6 - 15.9 g/dL 11.5(L) 11.4(L) 10.9(L)  Hematocrit 34.8 - 46.6 % 36.5 35.9 33.0(L)  Platelets 145 - 400 10e3/uL 125(L) 149 154   CMP Latest Ref Rng & Units 01/12/2017 12/17/2016 11/18/2016  Glucose 70 - 140 mg/dl 115 119 105  BUN 7.0 - 26.0 mg/dL 40.3(H) 24.8 26.1(H)  Creatinine 0.6 - 1.1 mg/dL 1.6(H) 1.5(H) 1.4(H)  Sodium 136 - 145 mEq/L 138 138 140  Potassium 3.5 - 5.1 mEq/L 4.9 4.3 4.4  Chloride 96 - 106 mmol/L - - -  CO2 22 - 29 mEq/L 21(L) 25 20(L)  Calcium 8.4 - 10.4 mg/dL 9.1 9.1 8.4  Total Protein 6.4 - 8.3 g/dL 7.2  7.2 6.7  Total Bilirubin 0.20 - 1.20 mg/dL 0.31 0.39 0.22  Alkaline Phos 40 - 150 U/L 106 86 93  AST 5 - 34 U/L 16 18 15   ALT 0 - 55 U/L 8 8 9     Imaging: No results found.  Speciality Comments: No specialty comments available.    Procedures:  No procedures performed Allergies: Doxycycline   Assessment / Plan:     Visit Diagnoses: No diagnosis found.    Orders: No orders of the defined types were placed in this encounter.  No orders of the defined types were placed in this encounter.   Face-to-face time spent with patient was *** minutes. 50% of time was spent in counseling and coordination of care.  Follow-Up Instructions: No Follow-up on file.   Earnestine Mealing, CMA  Note - This  record has been created using Editor, commissioning.  Chart creation errors have been sought, but may not always  have been located. Such creation errors do not reflect on  the standard of medical care.

## 2017-01-12 NOTE — Telephone Encounter (Signed)
Gave avs and calendar for January  °

## 2017-01-12 NOTE — Progress Notes (Signed)
[facility] OFFICE PROGRESS NOTE   Diagnosis: Breast cancer  INTERVAL HISTORY:   [name] returns as scheduled.  She continues Xeloda.  No mouth sores or diarrhea.  No hand or foot pain.  She reports stable back pain.  She takes MS Contin twice daily.  She takes Percocet approximately every 4 hours during the day and once during the night.  She wakes up secondary to pain.  Objective:  Vital signs in last 24 hours:  Blood pressure (!) 124/53, pulse 84, resp. rate 18, height 5' 2.5" (1.588 m), weight 212 lb 3.2 oz (96.3 kg), SpO2 100 %.    HEENT: Thrush or ulcers Resp: Lungs clear bilaterally Cardio: Regular rate and rhythm GI: No hepatomegaly, no mass, nontender Vascular: No leg edema  Skin: Mild erythema at the distal fingers, soles with mild erythema.  Dryness with superficial flaking at the heels    Lab Results:  Lab Results  Component Value Date   WBC 5.3 01/12/2017   HGB 11.5 (L) 01/12/2017   HCT 36.5 01/12/2017   MCV 103.4 (H) 01/12/2017   PLT 125 (L) 01/12/2017   NEUTROABS 4.1 01/12/2017    CMP     Component Value Date/Time   NA 138 01/12/2017 1014   K 4.9 01/12/2017 1014   CL 103 03/25/2016 1600   CO2 21 (L) 01/12/2017 1014   GLUCOSE 115 01/12/2017 1014   BUN 40.3 (H) 01/12/2017 1014   CREATININE 1.6 (H) 01/12/2017 1014   CALCIUM 9.1 01/12/2017 1014   PROT 7.2 01/12/2017 1014   ALBUMIN 3.6 01/12/2017 1014   AST 16 01/12/2017 1014   ALT 8 01/12/2017 1014   ALKPHOS 106 01/12/2017 1014   BILITOT 0.31 01/12/2017 1014   GFRNONAA 39 (L) 03/25/2016 1600   GFRAA 45 (L) 03/25/2016 1600   Medications: I have reviewed the patient's current medications.   Assessment/Plan: 1. 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 ER positive, PR positive, HER-2 negative She completed 4 cycles of adjuvant AC chemotherapy  June 15 through September 03, 2008, and then completed the left chest wall radiation. She began Arimidex following an office visit on November 11, 2008.  Bone scan 06/01/2013 suggestive of thoracic spine metastases, thoracic MRI 06/27/2011 consistent with multiple bone metastases involving the thoracolumbar spine   PET scan 07/09/2013 with multiple hypermetabolic bone lesions and hypermetabolic mediastinal nodes.   Left iliac lesion biopsy 07/26/2013. Pathology showed metastatic carcinoma, ER positive, PR positive, HER-2 negative   Initiation of Faslodex 08/10/2013.  Xgeva every 3 months initiated 07/27/2013.  Restaging PET scan 01/23/2014 with no residual hypermetabolic mediastinal disease; marked improvement in the metastatic bone disease.  Restaging PET scan 02/17/2015 with recurrence of skeletal metastasis. New hypermetabolic lesions within the pelvis and ribs. No hypermetabolic lymphadenopathy.  Continuation of Faslodex; initiation of Ibrance 03/05/2015  Cycle 2 Ibrance 04/09/2015-dose reduced to 100 mg daily  Cycle 3 Ibrance 05/08/2015  Cycle 4 Ibrance 06/11/2015  Cycle 5 Ibrance 07/15/2015  PET scan 08/12/2015-increase in size and increased metabolic activity associated with bone metastases, no new lesions  Ibrance/Faslodex discontinued  Tamoxifen started 08/07/2015  PET scan 02/04/2016 with new hypermetabolic liver lesions and numerous new skeletal lesions.  Xeloda, 7 days on/7 days off initiated February 2018  Xeloda dose reduced secondary to hand/foot syndrome beginning on 05/04/2016  Restaging MRI liver 05/18/2016-2 dominant hepatic metastases measuring up to 13 mm  favored to be improved.  Xeloda continued  PET scan 11/12/2016-significant improvement with complete resolution of multiple hepatic hypermetabolic lesions and most of the prior bony hypermetabolic lesions. The remaining hypermetabolic hepatic and bony lesions are smaller and/or demonstrate significantly  reduced activity.  Xeloda continued 2. Left chest subcutaneous mass noted on exam October 04, 2008, status post biopsy by Dr. Brantley Stage with a benign pathology. 3. History of delayed healing of the left mastectomy incision. 4. Rheumatoid arthritis. 5. Diabetes. 6. Hypercholesterolemia. 7. Hypertension. 8. Family history of breast cancer. She has been evaluated at the genetic screening clinic. 9. 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 10. 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 Hand/foot syndrome secondary to Xeloda-Xeloda dose reduced 04/17/201  Disposition: [name] appears unchanged.  She will continue Xeloda on the current schedule.  She continues to have pain, mostly secondary to metastatic breast cancer with a component due to rheumatoid arthritis.  She will continue MS Contin at a dose of 30 mg twice daily.  She will try taking a Percocet later at night to see if this will allow her to sleep longer.  She will take 1-1/2 Percocets as needed.  [name] will return for an office visit and Xgeva in 1 month.  15 minutes were spent with the patient today.  The majority of the time was used for counseling and coordination of care.  Betsy Coder, MD  01/12/2017  11:19 AM

## 2017-01-19 ENCOUNTER — Other Ambulatory Visit: Payer: Self-pay | Admitting: Oncology

## 2017-01-19 DIAGNOSIS — C50919 Malignant neoplasm of unspecified site of unspecified female breast: Secondary | ICD-10-CM

## 2017-01-19 DIAGNOSIS — C7951 Secondary malignant neoplasm of bone: Principal | ICD-10-CM

## 2017-01-21 MED FILL — ALPRAZolam 0.5 MG TABS: 0.5 | 23 days supply | Qty: 90 | Fill #0

## 2017-01-24 ENCOUNTER — Ambulatory Visit: Payer: Medicare Other | Admitting: Rheumatology

## 2017-01-31 ENCOUNTER — Other Ambulatory Visit: Payer: Self-pay | Admitting: Oncology

## 2017-01-31 DIAGNOSIS — C7951 Secondary malignant neoplasm of bone: Principal | ICD-10-CM

## 2017-01-31 DIAGNOSIS — C50919 Malignant neoplasm of unspecified site of unspecified female breast: Secondary | ICD-10-CM

## 2017-02-02 ENCOUNTER — Telehealth: Payer: Self-pay | Admitting: *Deleted

## 2017-02-02 ENCOUNTER — Other Ambulatory Visit: Payer: Self-pay

## 2017-02-02 NOTE — Telephone Encounter (Signed)
Call from pt reporting 1 loose stool per day, asks if it is OK to take Imodium. She reports the stool happens with little warning. This began 01/29/17. Denies fever or unusual pain.  Informed her it is OK to take Imodium after each loose stool. Call office for diarrhea not relieved with Imodium. Pt voiced understanding. Message to MD for review.

## 2017-02-08 ENCOUNTER — Telehealth: Payer: Self-pay | Admitting: *Deleted

## 2017-02-08 NOTE — Telephone Encounter (Signed)
Spoke with pt and gave pt appts for Wed  02/09/17 as per Dr. Gearldine Shown instructions.  Pt voiced understanding.

## 2017-02-08 NOTE — Telephone Encounter (Signed)
We can see her 02/09/2017 if she can come in

## 2017-02-08 NOTE — Telephone Encounter (Signed)
Pt called requesting a call back from nurse.  Spoke with pt, and was informed that pt was feeling weak since having diarrhea last week.  Stated " I don't think I have recovered from the diarrhea episode from last week ".  Stated bowel functioning fine now, no diarrhea.  Drinking 2 bottles of water a day, and some gatorade.  Eating fine, able to perform ADLs without difficulty.   Pt has appt with Dr. Benay Spice on  1/25 ;   Pt did not know if she should come in sooner or not.  Reinforced need to increase po fluids as close to 64 oz per day as possible and as tolerated.   Pt understood to take Imodium as needed if diarrhea reoccurs;  Take  Compazine for nausea, and push po fluids as tolerated. Pt understood to call office back if weakness symptom worsened. Pt's   Phone     [phone].

## 2017-02-09 ENCOUNTER — Telehealth: Payer: Self-pay | Admitting: Oncology

## 2017-02-09 ENCOUNTER — Inpatient Hospital Stay: Payer: Medicare Other

## 2017-02-09 ENCOUNTER — Inpatient Hospital Stay (HOSPITAL_BASED_OUTPATIENT_CLINIC_OR_DEPARTMENT_OTHER): Payer: Medicare Other | Admitting: Oncology

## 2017-02-09 ENCOUNTER — Inpatient Hospital Stay: Payer: Medicare Other | Attending: Oncology

## 2017-02-09 VITALS — BP 140/65 | HR 81 | Temp 98.0°F | Resp 18 | Ht 62.5 in | Wt 202.5 lb

## 2017-02-09 DIAGNOSIS — C50912 Malignant neoplasm of unspecified site of left female breast: Secondary | ICD-10-CM

## 2017-02-09 DIAGNOSIS — R634 Abnormal weight loss: Secondary | ICD-10-CM

## 2017-02-09 DIAGNOSIS — R52 Pain, unspecified: Secondary | ICD-10-CM

## 2017-02-09 DIAGNOSIS — D630 Anemia in neoplastic disease: Secondary | ICD-10-CM

## 2017-02-09 DIAGNOSIS — D6481 Anemia due to antineoplastic chemotherapy: Secondary | ICD-10-CM

## 2017-02-09 DIAGNOSIS — E119 Type 2 diabetes mellitus without complications: Secondary | ICD-10-CM | POA: Insufficient documentation

## 2017-02-09 DIAGNOSIS — C787 Secondary malignant neoplasm of liver and intrahepatic bile duct: Secondary | ICD-10-CM

## 2017-02-09 DIAGNOSIS — C7951 Secondary malignant neoplasm of bone: Secondary | ICD-10-CM

## 2017-02-09 DIAGNOSIS — I10 Essential (primary) hypertension: Secondary | ICD-10-CM | POA: Insufficient documentation

## 2017-02-09 DIAGNOSIS — M069 Rheumatoid arthritis, unspecified: Secondary | ICD-10-CM

## 2017-02-09 DIAGNOSIS — R110 Nausea: Secondary | ICD-10-CM | POA: Diagnosis not present

## 2017-02-09 DIAGNOSIS — C50919 Malignant neoplasm of unspecified site of unspecified female breast: Secondary | ICD-10-CM

## 2017-02-09 LAB — COMPREHENSIVE METABOLIC PANEL
ALK PHOS: 91 U/L (ref 40–150)
ALT: 11 U/L (ref 0–55)
AST: 16 U/L (ref 5–34)
Albumin: 3.6 g/dL (ref 3.5–5.0)
Anion gap: 11 (ref 3–11)
BUN: 20 mg/dL (ref 7–26)
CALCIUM: 8.4 mg/dL (ref 8.4–10.4)
CO2: 25 mmol/L (ref 22–29)
CREATININE: 1.11 mg/dL — AB (ref 0.60–1.10)
Chloride: 105 mmol/L (ref 98–109)
GFR calc non Af Amer: 51 mL/min — ABNORMAL LOW (ref >60)
GFR, EST AFRICAN AMERICAN: 59 mL/min — AB (ref >60)
Glucose, Bld: 143 mg/dL — ABNORMAL HIGH (ref 70–140)
Potassium: 3.5 mmol/L (ref 3.3–4.7)
SODIUM: 141 mmol/L (ref 136–145)
Total Bilirubin: 0.5 mg/dL (ref 0.2–1.2)
Total Protein: 7.1 g/dL (ref 6.4–8.3)

## 2017-02-09 LAB — CBC WITH DIFFERENTIAL/PLATELET
BASOS PCT: 0 %
Basophils Absolute: 0 10*3/uL (ref 0.0–0.1)
EOS ABS: 0.1 10*3/uL (ref 0.0–0.5)
EOS PCT: 1 %
HCT: 37.5 % (ref 34.8–46.6)
HEMOGLOBIN: 12.1 g/dL (ref 11.6–15.9)
LYMPHS ABS: 0.5 10*3/uL — AB (ref 0.9–3.3)
Lymphocytes Relative: 9 %
MCH: 32.9 pg (ref 25.1–34.0)
MCHC: 32.3 g/dL (ref 31.5–36.0)
MCV: 101.9 fL — ABNORMAL HIGH (ref 79.5–101.0)
MONO ABS: 0.4 10*3/uL (ref 0.1–0.9)
Monocytes Relative: 7 %
Neutro Abs: 5 10*3/uL (ref 1.5–6.5)
Neutrophils Relative %: 83 %
PLATELETS: 130 10*3/uL — AB (ref 145–400)
RBC: 3.68 MIL/uL — ABNORMAL LOW (ref 3.70–5.45)
RDW: 15.4 % (ref 11.2–16.1)
WBC: 6 10*3/uL (ref 3.9–10.3)

## 2017-02-09 MED ORDER — OXYCODONE HCL ER 20 MG PO T12A: 20.0000 mg | EXTENDED_RELEASE_TABLET | Freq: Two times a day (BID) | ORAL | 0 refills | Status: DC

## 2017-02-09 MED ORDER — OXYCODONE-ACETAMINOPHEN 10-325 MG PO TABS
1.0000 | ORAL_TABLET | ORAL | 0 refills | Status: DC | PRN
Start: 2017-02-09 — End: 2017-03-09

## 2017-02-09 MED ORDER — DIAZEPAM 5 MG PO TABS: 5.0000 mg | ORAL_TABLET | Freq: Every evening | ORAL | 0 refills | Status: DC | PRN

## 2017-02-09 MED ORDER — DENOSUMAB 120 MG/1.7ML ~~LOC~~ SOLN
120.0000 mg | Freq: Once | SUBCUTANEOUS | Status: AC
  Administered 2017-02-09: 120 mg via SUBCUTANEOUS

## 2017-02-09 MED ORDER — PROCHLORPERAZINE MALEATE 10 MG PO TABS: 10.0000 mg | ORAL_TABLET | Freq: Four times a day (QID) | ORAL | 1 refills | Status: AC | PRN

## 2017-02-09 MED FILL — OxyCONTIN 20 MG T12A: 20 | 30 days supply | Qty: 60 | Fill #0

## 2017-02-09 MED FILL — PROCHLORPERAZINE 10 MG TAB: 10 | 15 days supply | Qty: 60 | Fill #0

## 2017-02-09 MED FILL — diazePAM 5 MG TABS: 5 | 30 days supply | Qty: 30 | Fill #0

## 2017-02-09 MED FILL — OXYCODONE-APAP 10-325 MG TA: 10-325 | 30 days supply | Qty: 180 | Fill #0

## 2017-02-09 NOTE — Progress Notes (Signed)
[facility] OFFICE PROGRESS NOTE   Diagnosis: Breast cancer  INTERVAL HISTORY:   [name] returns prior to his scheduled visit.  She reports the acute onset of diarrhea beginning 01/29/2017.  This lasted for 2 days with 2-3 diarrhea stools per day.  She reports feeling "weak "since experiencing the diarrhea.  The diarrhea occurred when she was off of Xeloda.  She resumed Xeloda this week.  No recurrent diarrhea. She has intermittent nausea and "dry heaves ".  She mostly eats crackers.  She continues MS Contin and oxycodone for pain.   Objective:  Vital signs in last 24 hours:  Blood pressure 140/65, pulse 81, temperature 98 F (36.7 C), temperature source Oral, resp. rate 18, height 5' 2.5" (1.588 m), weight 202 lb 8 oz (91.9 kg), SpO2 99 %.    HEENT: No thrush or ulcers Resp: Lungs clear bilaterally Cardio: Regular rate and rhythm GI: No hepatomegaly, nontender, soft Vascular: No leg edema  Skin: Mild erythema at the palms and soles without skin breakdown   Lab Results:  Lab Results  Component Value Date   WBC 6.0 02/09/2017   HGB 12.1 02/09/2017   HCT 37.5 02/09/2017   MCV 101.9 (H) 02/09/2017   PLT 130 (L) 02/09/2017   NEUTROABS 5.0 02/09/2017    CMP     Component Value Date/Time   NA 141 02/09/2017 1117   NA 138 01/12/2017 1014   K 3.5 02/09/2017 1117   K 4.9 01/12/2017 1014   CL 105 02/09/2017 1117   CO2 25 02/09/2017 1117   CO2 21 (L) 01/12/2017 1014   GLUCOSE 143 (H) 02/09/2017 1117   GLUCOSE 115 01/12/2017 1014   BUN 20 02/09/2017 1117   BUN 40.3 (H) 01/12/2017 1014   CREATININE 1.11 (H) 02/09/2017 1117   CREATININE 1.6 (H) 01/12/2017 1014   CALCIUM 8.4 02/09/2017 1117   CALCIUM 9.1 01/12/2017 1014   PROT 7.1 02/09/2017 1117   PROT 7.2 01/12/2017 1014   ALBUMIN 3.6 02/09/2017 1117   ALBUMIN 3.6 01/12/2017 1014   AST 16 02/09/2017 1117   AST 16 01/12/2017 1014   ALT 11 02/09/2017 1117   ALT 8 01/12/2017 1014   ALKPHOS 91  02/09/2017 1117   ALKPHOS 106 01/12/2017 1014   BILITOT 0.5 02/09/2017 1117   BILITOT 0.31 01/12/2017 1014   GFRNONAA 51 (L) 02/09/2017 1117   GFRAA 59 (L) 02/09/2017 1117     Medications: I have reviewed the patient's current medications.   Assessment/Plan: 1. 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 ER positive, PR positive, HER-2 negative She completed 4 cycles of adjuvant AC chemotherapy June 15 through September 03, 2008, and then completed the left chest wall radiation. She began Arimidex following an office visit on November 11, 2008.  Bone scan 06/01/2013 suggestive of thoracic spine metastases, thoracic MRI 06/27/2011 consistent with multiple bone metastases involving the thoracolumbar spine   PET scan 07/09/2013 with multiple hypermetabolic bone lesions and hypermetabolic mediastinal nodes.   Left iliac lesion biopsy 07/26/2013. Pathology showed metastatic carcinoma, ER positive, PR positive, HER-2 negative   Initiation of Faslodex 08/10/2013.  Xgeva every 3 months initiated 07/27/2013.  Restaging PET scan 01/23/2014 with no residual hypermetabolic mediastinal disease; marked improvement in the metastatic bone disease.  Restaging PET scan 02/17/2015 with recurrence of skeletal metastasis. New hypermetabolic lesions within the pelvis and ribs. No hypermetabolic lymphadenopathy.  Continuation of Faslodex; initiation of Ibrance 03/05/2015  Cycle 2 Ibrance 04/09/2015-dose reduced to 100 mg daily  Cycle 3 Ibrance 05/08/2015  Cycle 4 Ibrance 06/11/2015  Cycle 5 Ibrance 07/15/2015  PET scan 08/12/2015-increase in size and increased metabolic activity associated with bone metastases, no new lesions  Ibrance/Faslodex discontinued  Tamoxifen started 08/07/2015  PET scan 02/04/2016 with new hypermetabolic liver lesions and numerous new  skeletal lesions.  Xeloda, 7 days on/7 days off initiated February 2018  Xeloda dose reduced secondary to hand/foot syndrome beginning on 05/04/2016  Restaging MRI liver 05/18/2016-2 dominant hepatic metastases measuring up to 13 mm favored to be improved.  Xeloda continued  PET scan 11/12/2016-significant improvement with complete resolution of multiple hepatic hypermetabolic lesions and most of the prior bony hypermetabolic lesions. The remaining hypermetabolic hepatic and bony lesions are smaller and/or demonstrate significantly reduced activity.  Xeloda continued 2. Left chest subcutaneous mass noted on exam October 04, 2008, status post biopsy by Dr. Brantley Stage with a benign pathology. 3. History of delayed healing of the left mastectomy incision. 4. Rheumatoid arthritis. 5. Diabetes. 6. Hypercholesterolemia. 7. Hypertension. 8. Family history of breast cancer. She has been evaluated at the genetic screening clinic. 9. 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 10. 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 Hand/foot syndrome secondary to Xeloda-Xeloda dose reduced 04/17/201  Disposition: [name] had acute diarrhea 2 weeks ago.  This may have been related to the Xeloda versus a self-limited GI illness.  She appears well today, but she is losing weight.  She complains of intermittent nausea and "dry heaves ".  Compazine helps the nausea.  It is possible the nausea is related to MS Contin.  She will contact us for recurrent diarrhea.  We decided to change from MS Contin to OxyContin.  She will let us know if the nausea is not improved.  We will consider trying Reglan.  [name] will return  for an office visit in 2 weeks.  25 minutes were spent with the patient today.  The majority of the time was used for counseling and coordination of care.  Betsy Coder, MD  02/09/2017  1:10 PM

## 2017-02-09 NOTE — Patient Instructions (Signed)

## 2017-02-09 NOTE — Telephone Encounter (Signed)
Gave avs and calendar for February  °

## 2017-02-10 LAB — CANCER ANTIGEN 27.29: CAN 27.29: 43.7 U/mL — AB (ref 0.0–38.6)

## 2017-02-11 ENCOUNTER — Other Ambulatory Visit: Payer: Medicare Other

## 2017-02-11 ENCOUNTER — Ambulatory Visit: Payer: Medicare Other | Admitting: Oncology

## 2017-02-11 ENCOUNTER — Ambulatory Visit: Payer: Medicare Other

## 2017-02-11 MED FILL — XELODA 500 MG TABLET: 500 | 21 days supply | Qty: 56 | Fill #0

## 2017-02-24 ENCOUNTER — Inpatient Hospital Stay: Payer: Medicare Other | Attending: Oncology | Admitting: Oncology

## 2017-02-24 ENCOUNTER — Inpatient Hospital Stay: Payer: Medicare Other

## 2017-02-24 VITALS — BP 124/62 | HR 88 | Temp 98.5°F | Resp 18 | Ht 62.5 in | Wt 212.1 lb

## 2017-02-24 DIAGNOSIS — I10 Essential (primary) hypertension: Secondary | ICD-10-CM

## 2017-02-24 DIAGNOSIS — M069 Rheumatoid arthritis, unspecified: Secondary | ICD-10-CM | POA: Diagnosis not present

## 2017-02-24 DIAGNOSIS — C50912 Malignant neoplasm of unspecified site of left female breast: Secondary | ICD-10-CM | POA: Diagnosis not present

## 2017-02-24 DIAGNOSIS — D638 Anemia in other chronic diseases classified elsewhere: Secondary | ICD-10-CM | POA: Diagnosis not present

## 2017-02-24 DIAGNOSIS — D630 Anemia in neoplastic disease: Secondary | ICD-10-CM | POA: Diagnosis not present

## 2017-02-24 DIAGNOSIS — D6481 Anemia due to antineoplastic chemotherapy: Secondary | ICD-10-CM

## 2017-02-24 DIAGNOSIS — C7951 Secondary malignant neoplasm of bone: Principal | ICD-10-CM

## 2017-02-24 DIAGNOSIS — C787 Secondary malignant neoplasm of liver and intrahepatic bile duct: Secondary | ICD-10-CM

## 2017-02-24 DIAGNOSIS — E119 Type 2 diabetes mellitus without complications: Secondary | ICD-10-CM | POA: Diagnosis not present

## 2017-02-24 DIAGNOSIS — C50919 Malignant neoplasm of unspecified site of unspecified female breast: Secondary | ICD-10-CM | POA: Diagnosis present

## 2017-02-24 LAB — CMP (CANCER CENTER ONLY)
ALT: 13 U/L (ref 0–55)
ANION GAP: 11 (ref 3–11)
AST: 19 U/L (ref 5–34)
Albumin: 3.5 g/dL (ref 3.5–5.0)
Alkaline Phosphatase: 112 U/L (ref 40–150)
BUN: 26 mg/dL (ref 7–26)
CHLORIDE: 104 mmol/L (ref 98–109)
CO2: 26 mmol/L (ref 22–29)
Calcium: 9.3 mg/dL (ref 8.4–10.4)
Creatinine: 1.39 mg/dL — ABNORMAL HIGH (ref 0.60–1.10)
GFR, EST AFRICAN AMERICAN: 45 mL/min — AB (ref >60)
GFR, EST NON AFRICAN AMERICAN: 39 mL/min — AB (ref >60)
Glucose, Bld: 107 mg/dL (ref 70–140)
POTASSIUM: 3.9 mmol/L (ref 3.5–5.1)
Sodium: 141 mmol/L (ref 136–145)
Total Bilirubin: 0.4 mg/dL (ref 0.2–1.2)
Total Protein: 6.8 g/dL (ref 6.4–8.3)

## 2017-02-24 LAB — CBC WITH DIFFERENTIAL (CANCER CENTER ONLY)
Basophils Absolute: 0 10*3/uL (ref 0.0–0.1)
Basophils Relative: 0 %
EOS PCT: 2 %
Eosinophils Absolute: 0.1 10*3/uL (ref 0.0–0.5)
HCT: 36.7 % (ref 34.8–46.6)
Hemoglobin: 11.7 g/dL (ref 11.6–15.9)
LYMPHS ABS: 0.5 10*3/uL — AB (ref 0.9–3.3)
Lymphocytes Relative: 14 %
MCH: 32.8 pg (ref 25.1–34.0)
MCHC: 31.9 g/dL (ref 31.5–36.0)
MCV: 102.8 fL — ABNORMAL HIGH (ref 79.5–101.0)
MONO ABS: 0.3 10*3/uL (ref 0.1–0.9)
Monocytes Relative: 9 %
Neutro Abs: 2.7 10*3/uL (ref 1.5–6.5)
Neutrophils Relative %: 75 %
PLATELETS: 141 10*3/uL — AB (ref 145–400)
RBC: 3.57 MIL/uL — ABNORMAL LOW (ref 3.70–5.45)
RDW: 15.3 % — AB (ref 11.2–14.5)
WBC Count: 3.6 10*3/uL — ABNORMAL LOW (ref 3.9–10.3)

## 2017-02-24 NOTE — Progress Notes (Signed)
[facility] OFFICE PROGRESS NOTE   Diagnosis: Breast cancer  INTERVAL HISTORY:   [name] returns as scheduled.  She reports resolution of nausea and diarrhea.  She is now taking OxyContin and oxycodone for pain.  She has a good appetite and has gained weight.   Objective:  Vital signs in last 24 hours:  Blood pressure 124/62, pulse 88, temperature 98.5 F (36.9 C), temperature source Oral, resp. rate 18, height 5' 2.5" (1.588 m), weight 212 lb 1.6 oz (96.2 kg), SpO2 97 %.    HEENT: No thrush or ulcers Resp: Lungs clear bilaterally Cardio: Regular rate and rhythm GI: No hepatomegaly, nontender, no mass Vascular: No leg edema  Skin: Mild erythema of the palms, erythema and skin thickening at the soles.   Lab Results:  Lab Results  Component Value Date   WBC 3.6 (L) 02/24/2017   HGB 12.1 02/09/2017   HCT 36.7 02/24/2017   MCV 102.8 (H) 02/24/2017   PLT 141 (L) 02/24/2017   NEUTROABS 2.7 02/24/2017    CMP     Component Value Date/Time   NA 141 02/24/2017 1034   NA 138 01/12/2017 1014   K 3.9 02/24/2017 1034   K 4.9 01/12/2017 1014   CL 104 02/24/2017 1034   CO2 26 02/24/2017 1034   CO2 21 (L) 01/12/2017 1014   GLUCOSE 107 02/24/2017 1034   GLUCOSE 115 01/12/2017 1014   BUN 26 02/24/2017 1034   BUN 40.3 (H) 01/12/2017 1014   CREATININE 1.39 (H) 02/24/2017 1034   CREATININE 1.6 (H) 01/12/2017 1014   CALCIUM 9.3 02/24/2017 1034   CALCIUM 9.1 01/12/2017 1014   PROT 6.8 02/24/2017 1034   PROT 7.2 01/12/2017 1014   ALBUMIN 3.5 02/24/2017 1034   ALBUMIN 3.6 01/12/2017 1014   AST 19 02/24/2017 1034   AST 16 01/12/2017 1014   ALT 13 02/24/2017 1034   ALT 8 01/12/2017 1014   ALKPHOS 112 02/24/2017 1034   ALKPHOS 106 01/12/2017 1014   BILITOT 0.4 02/24/2017 1034   BILITOT 0.31 01/12/2017 1014   GFRNONAA 39 (L) 02/24/2017 1034   GFRAA 45 (L) 02/24/2017 1034    Medications: I have reviewed the patient's current  medications.   Assessment/Plan: 1. 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 ER positive, PR positive, HER-2 negative She completed 4 cycles of adjuvant AC chemotherapy June 15 through September 03, 2008, and then completed the left chest wall radiation. She began Arimidex following an office visit on November 11, 2008.  Bone scan 06/01/2013 suggestive of thoracic spine metastases, thoracic MRI 06/27/2011 consistent with multiple bone metastases involving the thoracolumbar spine   PET scan 07/09/2013 with multiple hypermetabolic bone lesions and hypermetabolic mediastinal nodes.   Left iliac lesion biopsy 07/26/2013. Pathology showed metastatic carcinoma, ER positive, PR positive, HER-2 negative   Initiation of Faslodex 08/10/2013.  Xgeva every 3 months initiated 07/27/2013.  Restaging PET scan 01/23/2014 with no residual hypermetabolic mediastinal disease; marked improvement in the metastatic bone disease.  Restaging PET scan 02/17/2015 with recurrence of skeletal metastasis. New hypermetabolic lesions within the pelvis and ribs. No hypermetabolic lymphadenopathy.  Continuation of Faslodex; initiation of Ibrance 03/05/2015  Cycle 2 Ibrance 04/09/2015-dose reduced to 100 mg daily  Cycle 3 Ibrance 05/08/2015  Cycle 4 Ibrance 06/11/2015  Cycle 5 Ibrance 07/15/2015  PET scan 08/12/2015-increase in size and increased metabolic activity associated with bone metastases,  no new lesions  Ibrance/Faslodex discontinued  Tamoxifen started 08/07/2015  PET scan 02/04/2016 with new hypermetabolic liver lesions and numerous new skeletal lesions.  Xeloda, 7 days on/7 days off initiated February 2018  Xeloda dose reduced secondary to hand/foot syndrome beginning on 05/04/2016  Restaging MRI liver 05/18/2016-2 dominant hepatic metastases measuring up to 13 mm  favored to be improved.  Xeloda continued  PET scan 11/12/2016-significant improvement with complete resolution of multiple hepatic hypermetabolic lesions and most of the prior bony hypermetabolic lesions. The remaining hypermetabolic hepatic and bony lesions are smaller and/or demonstrate significantly reduced activity.  Xeloda continued 2. Left chest subcutaneous mass noted on exam October 04, 2008, status post biopsy by Dr. Brantley Stage with a benign pathology. 3. History of delayed healing of the left mastectomy incision. 4. Rheumatoid arthritis. 5. Diabetes. 6. Hypercholesterolemia. 7. Hypertension. 8. Family history of breast cancer. She has been evaluated at the genetic screening clinic. 9. 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 10. 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 Hand/foot syndrome secondary to Xeloda-Xeloda dose reduced 04/17/201  Disposition: [name] has an improved performance status today.  It appears she had a self-limited GI illness last month.  She will continue Xeloda.  She continues OxyContin/oxycodone for pain.  [name] will return for office visit in 1 month.  We will plan for a restaging MRI of the liver in May 2019.  15 minutes were spent with the patient today.  The majority of the time was used for counseling and coordination of care.  Betsy Coder, MD  02/24/2017  12:07 PM

## 2017-02-25 ENCOUNTER — Other Ambulatory Visit: Payer: Self-pay | Admitting: Pharmacist

## 2017-03-09 ENCOUNTER — Other Ambulatory Visit: Payer: Self-pay

## 2017-03-09 ENCOUNTER — Other Ambulatory Visit: Payer: Self-pay | Admitting: Oncology

## 2017-03-09 ENCOUNTER — Telehealth: Payer: Self-pay

## 2017-03-09 DIAGNOSIS — C50912 Malignant neoplasm of unspecified site of left female breast: Secondary | ICD-10-CM

## 2017-03-09 DIAGNOSIS — C7951 Secondary malignant neoplasm of bone: Principal | ICD-10-CM

## 2017-03-09 DIAGNOSIS — C50919 Malignant neoplasm of unspecified site of unspecified female breast: Secondary | ICD-10-CM

## 2017-03-09 MED ORDER — OXYCODONE HCL ER 20 MG PO T12A: 20.0000 mg | EXTENDED_RELEASE_TABLET | Freq: Two times a day (BID) | ORAL | 0 refills | Status: DC

## 2017-03-09 MED ORDER — DIAZEPAM 5 MG PO TABS: 5.0000 mg | ORAL_TABLET | Freq: Every evening | ORAL | 0 refills | Status: DC | PRN

## 2017-03-09 MED ORDER — OXYCODONE-ACETAMINOPHEN 10-325 MG PO TABS: 1.0000 | ORAL_TABLET | ORAL | 0 refills | Status: DC | PRN

## 2017-03-09 NOTE — Telephone Encounter (Signed)
Called to inform pt that paper prescription for refills is available for pickup in clinic. Pt will pick up on Friday.

## 2017-03-10 ENCOUNTER — Other Ambulatory Visit: Payer: Self-pay | Admitting: Oncology

## 2017-03-10 DIAGNOSIS — C7951 Secondary malignant neoplasm of bone: Principal | ICD-10-CM

## 2017-03-10 DIAGNOSIS — C50919 Malignant neoplasm of unspecified site of unspecified female breast: Secondary | ICD-10-CM

## 2017-03-11 MED FILL — OXYCODONE-APAP 10-325 MG TA: 10-325 | 30 days supply | Qty: 180 | Fill #0

## 2017-03-11 MED FILL — OxyCONTIN 20 MG T12A: 20 | 30 days supply | Qty: 60 | Fill #0

## 2017-03-11 MED FILL — diazePAM 5 MG TABS: 5 | 30 days supply | Qty: 30 | Fill #0

## 2017-03-14 MED FILL — XELODA 500 MG TABLET: 500 | 21 days supply | Qty: 56 | Fill #0

## 2017-03-23 ENCOUNTER — Inpatient Hospital Stay: Payer: Medicare Other | Attending: Oncology

## 2017-03-23 ENCOUNTER — Encounter: Payer: Self-pay | Admitting: Nurse Practitioner

## 2017-03-23 ENCOUNTER — Inpatient Hospital Stay (HOSPITAL_BASED_OUTPATIENT_CLINIC_OR_DEPARTMENT_OTHER): Payer: Medicare Other | Admitting: Nurse Practitioner

## 2017-03-23 ENCOUNTER — Telehealth: Payer: Self-pay | Admitting: Nurse Practitioner

## 2017-03-23 VITALS — BP 151/75 | HR 86 | Temp 98.5°F | Resp 17 | Ht 62.5 in | Wt 215.7 lb

## 2017-03-23 DIAGNOSIS — C50919 Malignant neoplasm of unspecified site of unspecified female breast: Secondary | ICD-10-CM

## 2017-03-23 DIAGNOSIS — D6481 Anemia due to antineoplastic chemotherapy: Secondary | ICD-10-CM | POA: Diagnosis not present

## 2017-03-23 DIAGNOSIS — C787 Secondary malignant neoplasm of liver and intrahepatic bile duct: Secondary | ICD-10-CM

## 2017-03-23 DIAGNOSIS — G893 Neoplasm related pain (acute) (chronic): Secondary | ICD-10-CM | POA: Insufficient documentation

## 2017-03-23 DIAGNOSIS — D638 Anemia in other chronic diseases classified elsewhere: Secondary | ICD-10-CM

## 2017-03-23 DIAGNOSIS — C50912 Malignant neoplasm of unspecified site of left female breast: Secondary | ICD-10-CM | POA: Diagnosis not present

## 2017-03-23 DIAGNOSIS — I10 Essential (primary) hypertension: Secondary | ICD-10-CM

## 2017-03-23 DIAGNOSIS — R194 Change in bowel habit: Secondary | ICD-10-CM

## 2017-03-23 DIAGNOSIS — C7951 Secondary malignant neoplasm of bone: Secondary | ICD-10-CM | POA: Insufficient documentation

## 2017-03-23 DIAGNOSIS — M069 Rheumatoid arthritis, unspecified: Secondary | ICD-10-CM

## 2017-03-23 DIAGNOSIS — D630 Anemia in neoplastic disease: Secondary | ICD-10-CM | POA: Insufficient documentation

## 2017-03-23 DIAGNOSIS — E119 Type 2 diabetes mellitus without complications: Secondary | ICD-10-CM

## 2017-03-23 LAB — CMP (CANCER CENTER ONLY)
ALK PHOS: 109 U/L (ref 40–150)
ALT: 8 U/L (ref 0–55)
ANION GAP: 12 — AB (ref 3–11)
AST: 18 U/L (ref 5–34)
Albumin: 3.8 g/dL (ref 3.5–5.0)
BUN: 22 mg/dL (ref 7–26)
CALCIUM: 10.5 mg/dL — AB (ref 8.4–10.4)
CO2: 22 mmol/L (ref 22–29)
Chloride: 104 mmol/L (ref 98–109)
Creatinine: 1.21 mg/dL — ABNORMAL HIGH (ref 0.60–1.10)
GFR, EST NON AFRICAN AMERICAN: 46 mL/min — AB (ref >60)
GFR, Est AFR Am: 53 mL/min — ABNORMAL LOW (ref >60)
Glucose, Bld: 70 mg/dL (ref 70–140)
Potassium: 4.4 mmol/L (ref 3.5–5.1)
SODIUM: 138 mmol/L (ref 136–145)
TOTAL PROTEIN: 7.4 g/dL (ref 6.4–8.3)
Total Bilirubin: 0.5 mg/dL (ref 0.2–1.2)

## 2017-03-23 LAB — CBC WITH DIFFERENTIAL (CANCER CENTER ONLY)
Basophils Absolute: 0 10*3/uL (ref 0.0–0.1)
Basophils Relative: 0 %
EOS ABS: 0.2 10*3/uL (ref 0.0–0.5)
EOS PCT: 4 %
HCT: 37.5 % (ref 34.8–46.6)
HEMOGLOBIN: 12.2 g/dL (ref 11.6–15.9)
LYMPHS ABS: 0.6 10*3/uL — AB (ref 0.9–3.3)
Lymphocytes Relative: 12 %
MCH: 33.2 pg (ref 25.1–34.0)
MCHC: 32.5 g/dL (ref 31.5–36.0)
MCV: 102.2 fL — ABNORMAL HIGH (ref 79.5–101.0)
Monocytes Absolute: 0.6 10*3/uL (ref 0.1–0.9)
Monocytes Relative: 12 %
NEUTROS PCT: 72 %
Neutro Abs: 3.5 10*3/uL (ref 1.5–6.5)
Platelet Count: 148 10*3/uL (ref 145–400)
RBC: 3.67 MIL/uL — ABNORMAL LOW (ref 3.70–5.45)
RDW: 15.3 % — ABNORMAL HIGH (ref 11.2–14.5)
WBC Count: 4.8 10*3/uL (ref 3.9–10.3)

## 2017-03-23 NOTE — Progress Notes (Signed)
[facility] OFFICE PROGRESS NOTE   Diagnosis:  Breast cancer  INTERVAL HISTORY:   [name] returns as scheduled.  Continue Xeloda.  She is on Xeloda this week.  She had loose stools a few times a day for the past 2 days.  Today she has had had one loose stool.  She takes Imodium as needed with good results.  No mouth sores.  No significant nausea.  She notes stable redness and some cracks in the skin on her palms and soles.  No associated pain.  She continues to have pain in various locations.  No new areas of pain.  She takes OxyContin 20 mg every 12 hours and Percocet every 4 hours.  She wakes up at night to take Percocet.  Objective:  Vital signs in last 24 hours:  Blood pressure (!) 151/75, pulse 86, temperature 98.5 F (36.9 C), temperature source Oral, resp. rate 17, height 5' 2.5" (1.588 m), weight 215 lb 11.2 oz (97.8 kg), SpO2 100 %.    HEENT: No thrush or ulcers. Resp: Lungs clear bilaterally. Cardio: Regular rate and rhythm. GI: Abdomen soft and nontender.  No hepatomegaly. Vascular: No leg edema.  Skin: Palms with mild erythema.  No skin breakdown.   Lab Results:  Lab Results  Component Value Date   WBC 4.8 03/23/2017   HGB 12.1 02/09/2017   HCT 37.5 03/23/2017   MCV 102.2 (H) 03/23/2017   PLT 148 03/23/2017   NEUTROABS 3.5 03/23/2017    Imaging:  No results found.  Medications: I have reviewed the patient's current medications.  Assessment/Plan: 1. 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 ER positive, PR positive, HER-2 negative She completed 4 cycles of adjuvant AC chemotherapy June 15 through September 03, 2008, and then completed the left chest wall radiation. She began Arimidex following an office visit on November 11, 2008.  Bone scan 06/01/2013 suggestive of thoracic spine metastases, thoracic MRI  06/27/2011 consistent with multiple bone metastases involving the thoracolumbar spine   PET scan 07/09/2013 with multiple hypermetabolic bone lesions and hypermetabolic mediastinal nodes.   Left iliac lesion biopsy 07/26/2013. Pathology showed metastatic carcinoma, ER positive, PR positive, HER-2 negative   Initiation of Faslodex 08/10/2013.  Xgeva every 3 months initiated 07/27/2013.  Restaging PET scan 01/23/2014 with no residual hypermetabolic mediastinal disease; marked improvement in the metastatic bone disease.  Restaging PET scan 02/17/2015 with recurrence of skeletal metastasis. New hypermetabolic lesions within the pelvis and ribs. No hypermetabolic lymphadenopathy.  Continuation of Faslodex; initiation of Ibrance 03/05/2015  Cycle 2 Ibrance 04/09/2015-dose reduced to 100 mg daily  Cycle 3 Ibrance 05/08/2015  Cycle 4 Ibrance 06/11/2015  Cycle 5 Ibrance 07/15/2015  PET scan 08/12/2015-increase in size and increased metabolic activity associated with bone metastases, no new lesions  Ibrance/Faslodex discontinued  Tamoxifen started 08/07/2015  PET scan 02/04/2016 with new hypermetabolic liver lesions and numerous new skeletal lesions.  Xeloda, 7 days on/7 days off initiated February 2018  Xeloda dose reduced secondary to hand/foot syndrome beginning on 05/04/2016  Restaging MRI liver 05/18/2016-2 dominant hepatic metastases measuring up to 13 mm favored to be improved.  Xeloda continued  PET scan 11/12/2016-significant improvement with complete resolution of multiple hepatic hypermetabolic lesions and most of the prior bony hypermetabolic lesions. The remaining hypermetabolic hepatic and bony lesions are smaller and/or demonstrate significantly reduced activity.  Xeloda continued 2. Left chest subcutaneous mass  noted on exam October 04, 2008, status post biopsy by Dr. Brantley Stage with a benign pathology. 3. History of delayed healing of the left mastectomy  incision. 4. Rheumatoid arthritis. 5. Diabetes. 6. Hypercholesterolemia. 7. Hypertension. 8. Family history of breast cancer. She has been evaluated at the genetic screening clinic. 9. 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 10. 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  She continues Xeloda 1 week on/1 week off.  There is no clinical evidence of disease progression.  We will follow-up on the CA-27-29 from today.  She will continue Xeloda on the same schedule for now.  The plan is for a restaging MRI of the liver in May 2019.  She has periodic loose stools which may be related to Xeloda.  She will continue Imodium as needed.  She understands to contact the office if Imodium does not control the diarrhea.  For pain she will continue OxyContin with Percocet as needed for breakthrough pain.  She will return for lab, follow-up and Xgeva in 1 month.  She will contact the office in the interim as outlined above or with any other problems.  Plan reviewed with Dr. Benay Spice.    [name] ANP/GNP-BC   03/23/2017  2:43 PM

## 2017-03-23 NOTE — Telephone Encounter (Signed)
Scheduled appt per 3/6 los - Gave patient AVS and calender per los.  

## 2017-03-24 LAB — CANCER ANTIGEN 27.29: CA 27.29: 49 U/mL — ABNORMAL HIGH (ref 0.0–38.6)

## 2017-04-04 ENCOUNTER — Other Ambulatory Visit: Payer: Self-pay | Admitting: Oncology

## 2017-04-04 DIAGNOSIS — C50919 Malignant neoplasm of unspecified site of unspecified female breast: Secondary | ICD-10-CM

## 2017-04-04 DIAGNOSIS — C7951 Secondary malignant neoplasm of bone: Principal | ICD-10-CM

## 2017-04-06 ENCOUNTER — Other Ambulatory Visit: Payer: Self-pay | Admitting: *Deleted

## 2017-04-06 DIAGNOSIS — C50912 Malignant neoplasm of unspecified site of left female breast: Secondary | ICD-10-CM

## 2017-04-06 DIAGNOSIS — C50919 Malignant neoplasm of unspecified site of unspecified female breast: Secondary | ICD-10-CM

## 2017-04-06 DIAGNOSIS — C7951 Secondary malignant neoplasm of bone: Principal | ICD-10-CM

## 2017-04-06 MED ORDER — OXYCODONE HCL ER 20 MG PO T12A: 20.0000 mg | EXTENDED_RELEASE_TABLET | Freq: Two times a day (BID) | ORAL | 0 refills | Status: DC

## 2017-04-06 MED ORDER — DIAZEPAM 5 MG PO TABS: 5.0000 mg | ORAL_TABLET | Freq: Every evening | ORAL | 0 refills | Status: DC | PRN

## 2017-04-06 MED ORDER — OXYCODONE-ACETAMINOPHEN 10-325 MG PO TABS: 1.0000 | ORAL_TABLET | ORAL | 0 refills | Status: DC | PRN

## 2017-04-06 NOTE — Telephone Encounter (Signed)
Informed pt that prescriptions can be picked up tomorrow.

## 2017-04-06 NOTE — Telephone Encounter (Signed)
Received call from pt asking for refills on her Percocet, Oxycontin, & valium to p/u tomorrow.  Message to Dr Sherrill/Pod RN.

## 2017-04-07 MED FILL — diazePAM 5 MG TABS: 5 | 30 days supply | Qty: 30 | Fill #0

## 2017-04-07 MED FILL — OxyCONTIN 20 MG T12A: 20 | 30 days supply | Qty: 60 | Fill #0

## 2017-04-07 MED FILL — OXYCODONE-APAP 10-325 MG TA: 10-325 | 30 days supply | Qty: 180 | Fill #0

## 2017-04-12 MED FILL — XELODA 500 MG TABLET: 500 | 21 days supply | Qty: 56 | Fill #0

## 2017-04-19 ENCOUNTER — Inpatient Hospital Stay (HOSPITAL_BASED_OUTPATIENT_CLINIC_OR_DEPARTMENT_OTHER): Payer: Medicare Other | Admitting: Oncology

## 2017-04-19 ENCOUNTER — Inpatient Hospital Stay: Payer: Medicare Other | Attending: Oncology

## 2017-04-19 ENCOUNTER — Telehealth: Payer: Self-pay | Admitting: Oncology

## 2017-04-19 ENCOUNTER — Inpatient Hospital Stay: Payer: Medicare Other

## 2017-04-19 VITALS — BP 142/52 | HR 92 | Temp 98.3°F | Resp 20 | Ht 62.5 in | Wt 210.8 lb

## 2017-04-19 DIAGNOSIS — R0781 Pleurodynia: Secondary | ICD-10-CM

## 2017-04-19 DIAGNOSIS — M069 Rheumatoid arthritis, unspecified: Secondary | ICD-10-CM | POA: Diagnosis not present

## 2017-04-19 DIAGNOSIS — C7951 Secondary malignant neoplasm of bone: Secondary | ICD-10-CM

## 2017-04-19 DIAGNOSIS — I10 Essential (primary) hypertension: Secondary | ICD-10-CM

## 2017-04-19 DIAGNOSIS — C787 Secondary malignant neoplasm of liver and intrahepatic bile duct: Secondary | ICD-10-CM | POA: Insufficient documentation

## 2017-04-19 DIAGNOSIS — E1149 Type 2 diabetes mellitus with other diabetic neurological complication: Secondary | ICD-10-CM | POA: Diagnosis not present

## 2017-04-19 DIAGNOSIS — C50912 Malignant neoplasm of unspecified site of left female breast: Secondary | ICD-10-CM

## 2017-04-19 DIAGNOSIS — C50919 Malignant neoplasm of unspecified site of unspecified female breast: Secondary | ICD-10-CM

## 2017-04-19 LAB — CMP (CANCER CENTER ONLY)
ALBUMIN: 3.7 g/dL (ref 3.5–5.0)
ALK PHOS: 110 U/L (ref 40–150)
ALT: 9 U/L (ref 0–55)
ANION GAP: 11 (ref 3–11)
AST: 17 U/L (ref 5–34)
BUN: 20 mg/dL (ref 7–26)
CO2: 24 mmol/L (ref 22–29)
Calcium: 9.6 mg/dL (ref 8.4–10.4)
Chloride: 103 mmol/L (ref 98–109)
Creatinine: 1.32 mg/dL — ABNORMAL HIGH (ref 0.60–1.10)
GFR, Est AFR Am: 48 mL/min — ABNORMAL LOW (ref >60)
GFR, Estimated: 41 mL/min — ABNORMAL LOW (ref >60)
GLUCOSE: 67 mg/dL — AB (ref 70–140)
POTASSIUM: 4 mmol/L (ref 3.5–5.1)
SODIUM: 138 mmol/L (ref 136–145)
Total Bilirubin: 0.4 mg/dL (ref 0.2–1.2)
Total Protein: 7.3 g/dL (ref 6.4–8.3)

## 2017-04-19 LAB — CBC WITH DIFFERENTIAL (CANCER CENTER ONLY)
Basophils Absolute: 0 10*3/uL (ref 0.0–0.1)
Basophils Relative: 0 %
Eosinophils Absolute: 0.2 10*3/uL (ref 0.0–0.5)
Eosinophils Relative: 2 %
HEMATOCRIT: 34.9 % (ref 34.8–46.6)
Hemoglobin: 11.4 g/dL — ABNORMAL LOW (ref 11.6–15.9)
LYMPHS PCT: 8 %
Lymphs Abs: 0.6 10*3/uL — ABNORMAL LOW (ref 0.9–3.3)
MCH: 33.5 pg (ref 25.1–34.0)
MCHC: 32.7 g/dL (ref 31.5–36.0)
MCV: 102.7 fL — AB (ref 79.5–101.0)
MONO ABS: 0.6 10*3/uL (ref 0.1–0.9)
MONOS PCT: 10 %
NEUTROS ABS: 5.4 10*3/uL (ref 1.5–6.5)
Neutrophils Relative %: 80 %
Platelet Count: 206 10*3/uL (ref 145–400)
RBC: 3.4 MIL/uL — ABNORMAL LOW (ref 3.70–5.45)
RDW: 17 % — AB (ref 11.2–14.5)
WBC Count: 6.8 10*3/uL (ref 3.9–10.3)

## 2017-04-19 MED ORDER — DENOSUMAB 120 MG/1.7ML ~~LOC~~ SOLN
120.0000 mg | Freq: Once | SUBCUTANEOUS | Status: AC
  Administered 2017-04-19: 120 mg via SUBCUTANEOUS

## 2017-04-19 NOTE — Progress Notes (Signed)
[facility] OFFICE PROGRESS NOTE   Diagnosis: Breast cancer  INTERVAL HISTORY:   [name] returns as scheduled.  She continue Xeloda.  She reports increased malaise.  She has developed "tingling "in the hands and feet.  This does not interfere with activity.  She continues to have pain.  The pain is mostly at the ribs.  She had an episode of diarrhea relieved with Imodium.  Objective:  Vital signs in last 24 hours:  Blood pressure (!) 142/52, pulse 92, temperature 98.3 F (36.8 C), temperature source Oral, resp. rate 20, height 5' 2.5" (1.588 m), weight 210 lb 12.8 oz (95.6 kg), SpO2 99 %.    HEENT: No thrush, mild erythema at the left buccal mucosa Resp: Lungs clear bilaterally Cardio: Regular rate and rhythm GI: No hepatomegaly, nontender Vascular: No leg edema  Skin: Mild erythema at the palms, mild erythema with skin thickening at the soles  Portacath/PICC-without erythema  Lab Results:  Lab Results  Component Value Date   WBC 6.8 04/19/2017   HGB 12.1 02/09/2017   HCT 34.9 04/19/2017   MCV 102.7 (H) 04/19/2017   PLT 206 04/19/2017   NEUTROABS 5.4 04/19/2017    CMP     Component Value Date/Time   NA 138 04/19/2017 1218   NA 138 01/12/2017 1014   K 4.0 04/19/2017 1218   K 4.9 01/12/2017 1014   CL 103 04/19/2017 1218   CO2 24 04/19/2017 1218   CO2 21 (L) 01/12/2017 1014   GLUCOSE 67 (L) 04/19/2017 1218   GLUCOSE 115 01/12/2017 1014   BUN 20 04/19/2017 1218   BUN 40.3 (H) 01/12/2017 1014   CREATININE 1.32 (H) 04/19/2017 1218   CREATININE 1.6 (H) 01/12/2017 1014   CALCIUM 9.6 04/19/2017 1218   CALCIUM 9.1 01/12/2017 1014   PROT 7.3 04/19/2017 1218   PROT 7.2 01/12/2017 1014   ALBUMIN 3.7 04/19/2017 1218   ALBUMIN 3.6 01/12/2017 1014   AST 17 04/19/2017 1218   AST 16 01/12/2017 1014   ALT 9 04/19/2017 1218   ALT 8 01/12/2017 1014   ALKPHOS 110 04/19/2017 1218   ALKPHOS 106 01/12/2017 1014   BILITOT 0.4 04/19/2017 1218   BILITOT 0.31  01/12/2017 1014   GFRNONAA 41 (L) 04/19/2017 1218   GFRAA 48 (L) 04/19/2017 1218     Medications: I have reviewed the patient's current medications.   Assessment/Plan: 1. 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 ER positive, PR positive, HER-2 negative She completed 4 cycles of adjuvant AC chemotherapy June 15 through September 03, 2008, and then completed the left chest wall radiation. She began Arimidex following an office visit on November 11, 2008.  Bone scan 06/01/2013 suggestive of thoracic spine metastases, thoracic MRI 06/27/2011 consistent with multiple bone metastases involving the thoracolumbar spine   PET scan 07/09/2013 with multiple hypermetabolic bone lesions and hypermetabolic mediastinal nodes.   Left iliac lesion biopsy 07/26/2013. Pathology showed metastatic carcinoma, ER positive, PR positive, HER-2 negative   Initiation of Faslodex 08/10/2013.  Xgeva every 3 months initiated 07/27/2013.  Restaging PET scan 01/23/2014 with no residual hypermetabolic mediastinal disease; marked improvement in the metastatic bone disease.  Restaging PET scan 02/17/2015 with recurrence of skeletal metastasis. New hypermetabolic lesions within the pelvis and ribs. No hypermetabolic lymphadenopathy.  Continuation of Faslodex; initiation of Ibrance 03/05/2015  Cycle 2 Ibrance 04/09/2015-dose reduced to 100 mg daily  Cycle 3 Ibrance 05/08/2015  Cycle 4 Ibrance 06/11/2015  Cycle 5 Ibrance 07/15/2015  PET scan 08/12/2015-increase in size and increased metabolic activity associated with bone metastases, no new lesions  Ibrance/Faslodex discontinued  Tamoxifen started 08/07/2015  PET scan 02/04/2016 with new hypermetabolic liver lesions and numerous new skeletal lesions.  Xeloda, 7 days on/7 days off initiated February 2018  Xeloda dose reduced  secondary to hand/foot syndrome beginning on 05/04/2016  Restaging MRI liver 05/18/2016-2 dominant hepatic metastases measuring up to 13 mm favored to be improved.  Xeloda continued  PET scan 11/12/2016-significant improvement with complete resolution of multiple hepatic hypermetabolic lesions and most of the prior bony hypermetabolic lesions. The remaining hypermetabolic hepatic and bony lesions are smaller and/or demonstrate significantly reduced activity.  Xeloda continued 2. Left chest subcutaneous mass noted on exam October 04, 2008, status post biopsy by Dr. Brantley Stage with a benign pathology. 3. History of delayed healing of the left mastectomy incision. 4. Rheumatoid arthritis. 5. Diabetes. 6. Hypercholesterolemia. 7. Hypertension. 8. Family history of breast cancer. She has been evaluated at the genetic screening clinic. 9. 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 10. 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  She will continue Xeloda.  She will undergo a restaging liver MRI prior to a return visit in 1 month.  She will receive Xgeva today.  Betsy Coder, MD  04/19/2017  1:14 PM

## 2017-04-19 NOTE — Patient Instructions (Signed)

## 2017-04-19 NOTE — Progress Notes (Signed)
May give Xgeva with Creatine of 1.32 today per Dr. Benay Spice

## 2017-04-19 NOTE — Telephone Encounter (Signed)
Appointments scheduled AVS/Calendar printed per 4/2 los °

## 2017-04-20 LAB — CANCER ANTIGEN 27.29: CAN 27.29: 62.1 U/mL — AB (ref 0.0–38.6)

## 2017-05-02 ENCOUNTER — Telehealth: Payer: Self-pay | Admitting: *Deleted

## 2017-05-02 ENCOUNTER — Telehealth: Payer: Self-pay

## 2017-05-02 NOTE — Telephone Encounter (Signed)
Voicemail inquiring about "expected MRI due before visit the end of this month I've not received a call or heard anything yet"

## 2017-05-02 NOTE — Telephone Encounter (Signed)
Pt questioning about appointment for MRI. Pt provided phone number for central scheduling. Pt will call to schedule appt.

## 2017-05-04 ENCOUNTER — Other Ambulatory Visit: Payer: Self-pay | Admitting: Oncology

## 2017-05-04 DIAGNOSIS — C50919 Malignant neoplasm of unspecified site of unspecified female breast: Secondary | ICD-10-CM

## 2017-05-04 DIAGNOSIS — C7951 Secondary malignant neoplasm of bone: Principal | ICD-10-CM

## 2017-05-05 ENCOUNTER — Ambulatory Visit (HOSPITAL_COMMUNITY)
Admission: RE | Admit: 2017-05-05 | Discharge: 2017-05-05 | Disposition: A | Discharge status: Home / Self Care | Payer: Medicare Other | Source: Ambulatory Visit | Attending: Oncology | Admitting: Oncology

## 2017-05-05 DIAGNOSIS — C50912 Malignant neoplasm of unspecified site of left female breast: Secondary | ICD-10-CM

## 2017-05-05 DIAGNOSIS — C7951 Secondary malignant neoplasm of bone: Principal | ICD-10-CM

## 2017-05-06 ENCOUNTER — Telehealth: Payer: Self-pay

## 2017-05-06 ENCOUNTER — Telehealth: Payer: Self-pay | Admitting: *Deleted

## 2017-05-06 DIAGNOSIS — C50912 Malignant neoplasm of unspecified site of left female breast: Secondary | ICD-10-CM

## 2017-05-06 DIAGNOSIS — C7951 Secondary malignant neoplasm of bone: Principal | ICD-10-CM

## 2017-05-06 NOTE — Telephone Encounter (Signed)
Pt called this AM stating she was sent to [facility] per scheduling for MRI but was actually scheduled at [facility] imaging.  MRI has not been done- noted as ordered at [facility] imaging.  [name] states she is to have the MRI before 4/30  This note will be sent to MD and nurse for further follow up and contact with patient.  Return call number given as [phone]

## 2017-05-06 NOTE — Telephone Encounter (Signed)
Spoke with pt regarding message below about MRI. Pt voiced "I didn't feel well so I had to leave before the procedure was done". This RN voiced understanding, will get MRI rescheduled. Pt also states "I'm not feeling too well. I've had 1 episode of diarrhea today and some nausea. I took imodium and nausea medication and it has helped". This RN voiced understanding.   Spoke with Manus Gunning at [facility] to schedule MRI. NPO 4 hours prior. Hour long appt. Arrive at 2:40pm for Saturday 27th . Pt voiced understanding.

## 2017-05-09 ENCOUNTER — Other Ambulatory Visit: Payer: Self-pay

## 2017-05-09 DIAGNOSIS — C7951 Secondary malignant neoplasm of bone: Principal | ICD-10-CM

## 2017-05-09 DIAGNOSIS — C50912 Malignant neoplasm of unspecified site of left female breast: Secondary | ICD-10-CM

## 2017-05-09 DIAGNOSIS — C50919 Malignant neoplasm of unspecified site of unspecified female breast: Secondary | ICD-10-CM

## 2017-05-09 MED ORDER — OXYCODONE-ACETAMINOPHEN 10-325 MG PO TABS: 1.0000 | ORAL_TABLET | ORAL | 0 refills | Status: DC | PRN

## 2017-05-09 MED ORDER — DIAZEPAM 5 MG PO TABS: 5.0000 mg | ORAL_TABLET | Freq: Every evening | ORAL | 0 refills | Status: DC | PRN

## 2017-05-09 MED ORDER — OXYCODONE HCL ER 20 MG PO T12A: 20.0000 mg | EXTENDED_RELEASE_TABLET | Freq: Two times a day (BID) | ORAL | 0 refills | Status: DC

## 2017-05-10 MED FILL — XELODA 500 MG TABLET: 500 | 21 days supply | Qty: 56 | Fill #0

## 2017-05-11 MED FILL — OxyCONTIN 20 MG T12A: 20 | 30 days supply | Qty: 60 | Fill #0

## 2017-05-11 MED FILL — diazePAM 5 MG TABS: 5 | 30 days supply | Qty: 30 | Fill #0

## 2017-05-11 MED FILL — OXYCODONE-APAP 10-325: 10-325 | 30 days supply | Qty: 180 | Fill #0

## 2017-05-14 ENCOUNTER — Ambulatory Visit
Admission: RE | Admit: 2017-05-14 | Discharge: 2017-05-14 | Disposition: A | Discharge status: Home / Self Care | Payer: Medicare Other | Source: Ambulatory Visit | Attending: Oncology | Admitting: Oncology

## 2017-05-14 DIAGNOSIS — C7951 Secondary malignant neoplasm of bone: Principal | ICD-10-CM

## 2017-05-14 DIAGNOSIS — C50912 Malignant neoplasm of unspecified site of left female breast: Secondary | ICD-10-CM

## 2017-05-14 MED ORDER — GADOBENATE DIMEGLUMINE 529 MG/ML IV SOLN
10.0000 mL | Freq: Once | INTRAVENOUS | Status: AC | PRN
  Administered 2017-05-14: 10 mL via INTRAVENOUS

## 2017-05-17 ENCOUNTER — Telehealth: Payer: Self-pay | Admitting: Oncology

## 2017-05-17 ENCOUNTER — Inpatient Hospital Stay: Payer: Medicare Other

## 2017-05-17 ENCOUNTER — Inpatient Hospital Stay (HOSPITAL_BASED_OUTPATIENT_CLINIC_OR_DEPARTMENT_OTHER): Payer: Medicare Other | Admitting: Nurse Practitioner

## 2017-05-17 VITALS — BP 126/77 | HR 86 | Temp 98.2°F | Resp 20 | Ht 62.5 in | Wt 207.8 lb

## 2017-05-17 DIAGNOSIS — C787 Secondary malignant neoplasm of liver and intrahepatic bile duct: Secondary | ICD-10-CM | POA: Diagnosis not present

## 2017-05-17 DIAGNOSIS — C7951 Secondary malignant neoplasm of bone: Principal | ICD-10-CM

## 2017-05-17 DIAGNOSIS — I10 Essential (primary) hypertension: Secondary | ICD-10-CM | POA: Diagnosis not present

## 2017-05-17 DIAGNOSIS — Z7189 Other specified counseling: Secondary | ICD-10-CM | POA: Insufficient documentation

## 2017-05-17 DIAGNOSIS — G893 Neoplasm related pain (acute) (chronic): Secondary | ICD-10-CM | POA: Diagnosis not present

## 2017-05-17 DIAGNOSIS — M069 Rheumatoid arthritis, unspecified: Secondary | ICD-10-CM

## 2017-05-17 DIAGNOSIS — C50919 Malignant neoplasm of unspecified site of unspecified female breast: Secondary | ICD-10-CM | POA: Diagnosis not present

## 2017-05-17 DIAGNOSIS — E119 Type 2 diabetes mellitus without complications: Secondary | ICD-10-CM | POA: Diagnosis not present

## 2017-05-17 DIAGNOSIS — C50912 Malignant neoplasm of unspecified site of left female breast: Secondary | ICD-10-CM | POA: Diagnosis not present

## 2017-05-17 LAB — CBC WITH DIFFERENTIAL (CANCER CENTER ONLY)
Basophils Absolute: 0 10*3/uL (ref 0.0–0.1)
Basophils Relative: 0 %
EOS ABS: 0.1 10*3/uL (ref 0.0–0.5)
EOS PCT: 3 %
HCT: 35.4 % (ref 34.8–46.6)
HEMOGLOBIN: 11.7 g/dL (ref 11.6–15.9)
LYMPHS ABS: 0.5 10*3/uL — AB (ref 0.9–3.3)
LYMPHS PCT: 11 %
MCH: 33.8 pg (ref 25.1–34.0)
MCHC: 33.2 g/dL (ref 31.5–36.0)
MCV: 101.9 fL — AB (ref 79.5–101.0)
MONOS PCT: 10 %
Monocytes Absolute: 0.5 10*3/uL (ref 0.1–0.9)
Neutro Abs: 3.7 10*3/uL (ref 1.5–6.5)
Neutrophils Relative %: 76 %
PLATELETS: 196 10*3/uL (ref 145–400)
RBC: 3.47 MIL/uL — AB (ref 3.70–5.45)
RDW: 16.8 % — ABNORMAL HIGH (ref 11.2–14.5)
WBC: 4.8 10*3/uL (ref 3.9–10.3)

## 2017-05-17 LAB — CMP (CANCER CENTER ONLY)
ALBUMIN: 3.8 g/dL (ref 3.5–5.0)
ALT: 10 U/L (ref 0–55)
AST: 22 U/L (ref 5–34)
Alkaline Phosphatase: 141 U/L (ref 40–150)
Anion gap: 9 (ref 3–11)
BUN: 18 mg/dL (ref 7–26)
CHLORIDE: 104 mmol/L (ref 98–109)
CO2: 25 mmol/L (ref 22–29)
CREATININE: 1.24 mg/dL — AB (ref 0.60–1.10)
Calcium: 8.8 mg/dL (ref 8.4–10.4)
GFR, EST NON AFRICAN AMERICAN: 44 mL/min — AB (ref >60)
GFR, Est AFR Am: 51 mL/min — ABNORMAL LOW (ref >60)
Glucose, Bld: 81 mg/dL (ref 70–140)
POTASSIUM: 4.2 mmol/L (ref 3.5–5.1)
SODIUM: 138 mmol/L (ref 136–145)
Total Bilirubin: 0.4 mg/dL (ref 0.2–1.2)
Total Protein: 7.4 g/dL (ref 6.4–8.3)

## 2017-05-17 MED ORDER — DEXAMETHASONE 4 MG PO TABS: ORAL_TABLET | ORAL | 0 refills | Status: DC

## 2017-05-17 MED FILL — DEXAMETHASONE 4 MG TABLET: 4 | 2 days supply | Qty: 6 | Fill #0

## 2017-05-17 NOTE — Progress Notes (Addendum)
[place] OFFICE PROGRESS NOTE   Diagnosis: Breast cancer  INTERVAL HISTORY:   [name] returns as scheduled.  She continues Xeloda.  She denies nausea/vomiting.  No mouth sores.  No diarrhea.  No hand or foot pain or redness.  Pain overall is unchanged.  She continues OxyContin with Percocet as needed.  Objective:  Vital signs in last 24 hours:  Blood pressure 126/77, pulse 86, temperature 98.2 F (36.8 C), temperature source Oral, resp. rate 20, height 5' 2.5" (1.588 m), weight 207 lb 12.8 oz (94.3 kg), SpO2 100 %.    HEENT: Tiny ulceration right lateral tongue. Resp: Lungs clear bilaterally. Cardio: Regular rate and rhythm. GI: Abdomen soft and nontender.  No hepatomegaly. Vascular: No leg edema.  Skin: Palms without erythema.   Lab Results:  Lab Results  Component Value Date   WBC 4.8 05/17/2017   HGB 11.7 05/17/2017   HCT 35.4 05/17/2017   MCV 101.9 (H) 05/17/2017   PLT 196 05/17/2017   NEUTROABS 3.7 05/17/2017    Imaging:  No results found.  Medications: I have reviewed the patient's current medications.  Assessment/Plan: 1. 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 ER positive, PR positive, HER-2 negative She completed 4 cycles of adjuvant AC chemotherapy June 15 through September 03, 2008, and then completed the left chest wall radiation. She began Arimidex following an office visit on November 11, 2008.  Bone scan 06/01/2013 suggestive of thoracic spine metastases, thoracic MRI 06/27/2011 consistent with multiple bone metastases involving the thoracolumbar spine   PET scan 07/09/2013 with multiple hypermetabolic bone lesions and hypermetabolic mediastinal nodes.   Left iliac lesion biopsy 07/26/2013. Pathology showed metastatic carcinoma, ER positive, PR positive, HER-2 negative   Initiation of Faslodex  08/10/2013.  Xgeva every 3 months initiated 07/27/2013.  Restaging PET scan 01/23/2014 with no residual hypermetabolic mediastinal disease; marked improvement in the metastatic bone disease.  Restaging PET scan 02/17/2015 with recurrence of skeletal metastasis. New hypermetabolic lesions within the pelvis and ribs. No hypermetabolic lymphadenopathy.  Continuation of Faslodex; initiation of Ibrance 03/05/2015  Cycle 2 Ibrance 04/09/2015-dose reduced to 100 mg daily  Cycle 3 Ibrance 05/08/2015  Cycle 4 Ibrance 06/11/2015  Cycle 5 Ibrance 07/15/2015  PET scan 08/12/2015-increase in size and increased metabolic activity associated with bone metastases, no new lesions  Ibrance/Faslodex discontinued  Tamoxifen started 08/07/2015  PET scan 02/04/2016 with new hypermetabolic liver lesions and numerous new skeletal lesions.  Xeloda, 7 days on/7 days off initiated February 2018  Xeloda dose reduced secondary to hand/foot syndrome beginning on 05/04/2016  Restaging MRI liver 05/18/2016-2 dominant hepatic metastases measuring up to 13 mm favored to be improved.  Xeloda continued  PET scan 11/12/2016-significant improvement with complete resolution of multiple hepatic hypermetabolic lesions and most of the prior bony hypermetabolic lesions. The remaining hypermetabolic hepatic and bony lesions are smaller and/or demonstrate significantly reduced activity.  Xeloda continued  MRI abdomen 05/16/2017-mixed response to therapy.  1 previously noted liver lesion in segment 3 completely resolved; other previously noted hepatic lesion increased significantly in size.  2 new liver lesions.  Previously noted lesions in the lumbar spine similar. 2. Left chest subcutaneous mass noted on exam October 04, 2008, status post biopsy by Dr. Brantley Stage with a benign pathology. 3. History of delayed healing of the left mastectomy incision. 4. Rheumatoid  arthritis. 5. Diabetes. 6. Hypercholesterolemia. 7. Hypertension. 8. Family  history of breast cancer. She has been evaluated at the genetic screening clinic. 9. 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 10. 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  The recent MRI showed evidence of progression in the liver.  Dr. Benay Spice recommends discontinuation of Xeloda.  We discussed treatment options.  Dr. Benay Spice recommends weekly Taxol (weekly x3 followed by 1 week break).  We reviewed potential toxicities associated with Taxol including bone marrow toxicity, hair loss, neuropathy, nausea, arthralgias and allergic reaction.  She understands the rationale for dexamethasone premedication.  A prescription was sent to her pharmacy for dexamethasone to take at 10 PM the night prior to the first treatment and 6 AM day of first treatment.  She will require a Port-A-Cath.  A referral was made to interventional radiology at [facility].  We anticipate she will begin cycle 1 week 1 Taxol 05/30/2017.  We will see her prior to cycle 1 week 2 on 06/06/2017.  She will contact the office in the interim with any problems.  Patient seen with Dr. Benay Spice.  25 minutes were spent face-to-face at today's visit with the majority of that time involved in counseling/coordination of care.    [name] ANP/GNP-BC   05/17/2017  1:46 PM  This was a shared visit with [name].  We reviewed the MRI images with [name] and her sister.  There is evidence of disease progression in the liver.  She will discontinue  Xeloda.  We discussed treatment options.  The plan is to begin weekly Taxol chemotherapy.  We reviewed potential toxicities associated with Taxol and she agrees to proceed.  She will be referred for Port-A-Cath placement prior to beginning chemotherapy.  Sabrina Manson, MD

## 2017-05-17 NOTE — Progress Notes (Signed)
START ON PATHWAY REGIMEN - Breast     A cycle is every 28 days (3 weeks on and 1 week off):     Paclitaxel   **Always confirm dose/schedule in your pharmacy ordering system**    Patient Characteristics: Distant Metastases or Locoregional Recurrent Disease - Unresected, HER2 Negative/Unknown/Equivocal, ER Positive, Chemotherapy, Second Line, No Prior Paclitaxel Therapeutic Status: Distant Metastases BRCA Mutation Status: Did Not Order Test ER Status: Positive (+) HER2 Status: Negative (-) PR Status: Positive (+) Line of therapy: Second Line Intent of Therapy: Non-Curative / Palliative Intent, Discussed with Patient

## 2017-05-17 NOTE — Telephone Encounter (Signed)
Appointments scheduled AVS/Calendar printed per 4/30 los °

## 2017-05-18 ENCOUNTER — Other Ambulatory Visit: Payer: Self-pay | Admitting: Pharmacist

## 2017-05-18 LAB — CANCER ANTIGEN 27.29: CAN 27.29: 68.8 U/mL — AB (ref 0.0–38.6)

## 2017-05-20 ENCOUNTER — Other Ambulatory Visit: Payer: Self-pay | Admitting: Radiology

## 2017-05-23 ENCOUNTER — Encounter (HOSPITAL_COMMUNITY): Payer: Self-pay

## 2017-05-23 ENCOUNTER — Other Ambulatory Visit: Payer: Self-pay | Admitting: Nurse Practitioner

## 2017-05-23 ENCOUNTER — Ambulatory Visit (HOSPITAL_COMMUNITY)
Admission: RE | Admit: 2017-05-23 | Discharge: 2017-05-23 | Disposition: A | Discharge status: Home / Self Care | Payer: Medicare Other | Source: Ambulatory Visit | Attending: Oncology | Admitting: Oncology

## 2017-05-23 ENCOUNTER — Ambulatory Visit (HOSPITAL_COMMUNITY)
Admission: RE | Admit: 2017-05-23 | Discharge: 2017-05-23 | Disposition: A | Discharge status: Home / Self Care | Payer: Medicare Other | Source: Ambulatory Visit | Attending: Nurse Practitioner | Admitting: Nurse Practitioner

## 2017-05-23 DIAGNOSIS — E785 Hyperlipidemia, unspecified: Secondary | ICD-10-CM | POA: Insufficient documentation

## 2017-05-23 DIAGNOSIS — C787 Secondary malignant neoplasm of liver and intrahepatic bile duct: Secondary | ICD-10-CM | POA: Insufficient documentation

## 2017-05-23 DIAGNOSIS — Z9049 Acquired absence of other specified parts of digestive tract: Secondary | ICD-10-CM | POA: Diagnosis not present

## 2017-05-23 DIAGNOSIS — Z9012 Acquired absence of left breast and nipple: Secondary | ICD-10-CM | POA: Diagnosis not present

## 2017-05-23 DIAGNOSIS — C7951 Secondary malignant neoplasm of bone: Secondary | ICD-10-CM | POA: Insufficient documentation

## 2017-05-23 DIAGNOSIS — Z96641 Presence of right artificial hip joint: Secondary | ICD-10-CM | POA: Insufficient documentation

## 2017-05-23 DIAGNOSIS — C50919 Malignant neoplasm of unspecified site of unspecified female breast: Secondary | ICD-10-CM | POA: Insufficient documentation

## 2017-05-23 DIAGNOSIS — Z79899 Other long term (current) drug therapy: Secondary | ICD-10-CM | POA: Insufficient documentation

## 2017-05-23 DIAGNOSIS — E119 Type 2 diabetes mellitus without complications: Secondary | ICD-10-CM | POA: Insufficient documentation

## 2017-05-23 DIAGNOSIS — Z7984 Long term (current) use of oral hypoglycemic drugs: Secondary | ICD-10-CM | POA: Insufficient documentation

## 2017-05-23 DIAGNOSIS — M1990 Unspecified osteoarthritis, unspecified site: Secondary | ICD-10-CM | POA: Diagnosis not present

## 2017-05-23 DIAGNOSIS — Z9889 Other specified postprocedural states: Secondary | ICD-10-CM | POA: Diagnosis not present

## 2017-05-23 DIAGNOSIS — Z881 Allergy status to other antibiotic agents status: Secondary | ICD-10-CM | POA: Insufficient documentation

## 2017-05-23 DIAGNOSIS — Z96651 Presence of right artificial knee joint: Secondary | ICD-10-CM | POA: Diagnosis not present

## 2017-05-23 DIAGNOSIS — I10 Essential (primary) hypertension: Secondary | ICD-10-CM | POA: Insufficient documentation

## 2017-05-23 HISTORY — PX: IR US GUIDE VASC ACCESS RIGHT: IMG2390

## 2017-05-23 HISTORY — PX: IR FLUORO GUIDE PORT INSERTION RIGHT: IMG5741

## 2017-05-23 LAB — CBC WITH DIFFERENTIAL/PLATELET
BASOS ABS: 0 10*3/uL (ref 0.0–0.1)
Basophils Relative: 0 %
EOS ABS: 0.3 10*3/uL (ref 0.0–0.7)
EOS PCT: 4 %
HCT: 36.5 % (ref 36.0–46.0)
Hemoglobin: 11.9 g/dL — ABNORMAL LOW (ref 12.0–15.0)
LYMPHS PCT: 13 %
Lymphs Abs: 0.9 10*3/uL (ref 0.7–4.0)
MCH: 33.4 pg (ref 26.0–34.0)
MCHC: 32.6 g/dL (ref 30.0–36.0)
MCV: 102.5 fL — ABNORMAL HIGH (ref 78.0–100.0)
MONO ABS: 0.7 10*3/uL (ref 0.1–1.0)
Monocytes Relative: 11 %
Neutro Abs: 5 10*3/uL (ref 1.7–7.7)
Neutrophils Relative %: 72 %
PLATELETS: 226 10*3/uL (ref 150–400)
RBC: 3.56 MIL/uL — AB (ref 3.87–5.11)
RDW: 15.6 % — AB (ref 11.5–15.5)
WBC: 6.8 10*3/uL (ref 4.0–10.5)

## 2017-05-23 LAB — GLUCOSE, CAPILLARY: GLUCOSE-CAPILLARY: 90 mg/dL (ref 65–99)

## 2017-05-23 LAB — BASIC METABOLIC PANEL
Anion gap: 11 (ref 5–15)
BUN: 21 mg/dL — AB (ref 6–20)
CO2: 22 mmol/L (ref 22–32)
Calcium: 9.7 mg/dL (ref 8.9–10.3)
Chloride: 107 mmol/L (ref 101–111)
Creatinine, Ser: 1.35 mg/dL — ABNORMAL HIGH (ref 0.44–1.00)
GFR calc Af Amer: 46 mL/min — ABNORMAL LOW (ref >60)
GFR, EST NON AFRICAN AMERICAN: 40 mL/min — AB (ref >60)
GLUCOSE: 110 mg/dL — AB (ref 65–99)
POTASSIUM: 4.1 mmol/L (ref 3.5–5.1)
SODIUM: 140 mmol/L (ref 135–145)

## 2017-05-23 LAB — PROTIME-INR
INR: 0.96
PROTHROMBIN TIME: 12.7 s (ref 11.4–15.2)

## 2017-05-23 MED ORDER — SODIUM CHLORIDE 0.9 % IV SOLN
INTRAVENOUS | Status: DC
  Administered 2017-05-23: 13:00:00 via INTRAVENOUS

## 2017-05-23 MED ORDER — CEFAZOLIN SODIUM-DEXTROSE 2-4 GM/100ML-% IV SOLN
2.0000 g | INTRAVENOUS | Status: AC
  Administered 2017-05-23: 2 g via INTRAVENOUS

## 2017-05-23 MED ORDER — HEPARIN SOD (PORK) LOCK FLUSH 100 UNIT/ML IV SOLN
INTRAVENOUS | Status: AC
  Filled 2017-05-23: qty 5

## 2017-05-23 MED ORDER — LIDOCAINE-EPINEPHRINE (PF) 2 %-1:200000 IJ SOLN
INTRAMUSCULAR | Status: AC
  Filled 2017-05-23: qty 20

## 2017-05-23 MED ORDER — FENTANYL CITRATE (PF) 100 MCG/2ML IJ SOLN
INTRAMUSCULAR | Status: AC | PRN
  Administered 2017-05-23 (×2): 50 ug via INTRAVENOUS

## 2017-05-23 MED ORDER — LIDOCAINE HCL (PF) 1 % IJ SOLN
INTRAMUSCULAR | Status: AC
  Filled 2017-05-23: qty 30

## 2017-05-23 MED ORDER — LIDOCAINE-EPINEPHRINE (PF) 2 %-1:200000 IJ SOLN
INTRAMUSCULAR | Status: AC | PRN
  Administered 2017-05-23: 20 mL

## 2017-05-23 MED ORDER — MIDAZOLAM HCL 2 MG/2ML IJ SOLN
INTRAMUSCULAR | Status: AC
  Filled 2017-05-23: qty 4

## 2017-05-23 MED ORDER — FENTANYL CITRATE (PF) 100 MCG/2ML IJ SOLN
INTRAMUSCULAR | Status: AC
  Filled 2017-05-23: qty 2

## 2017-05-23 MED ORDER — CEFAZOLIN SODIUM-DEXTROSE 2-4 GM/100ML-% IV SOLN
INTRAVENOUS | Status: AC
  Administered 2017-05-23: 2 g via INTRAVENOUS
  Filled 2017-05-23: qty 100

## 2017-05-23 MED ORDER — MIDAZOLAM HCL 2 MG/2ML IJ SOLN
INTRAMUSCULAR | Status: AC | PRN
  Administered 2017-05-23 (×4): 1 mg via INTRAVENOUS

## 2017-05-23 MED ORDER — LIDOCAINE HCL (PF) 1 % IJ SOLN
INTRAMUSCULAR | Status: AC | PRN
  Administered 2017-05-23: 5 mL

## 2017-05-23 NOTE — Procedures (Signed)
  Procedure: R IJ Port catheter   EBL:   minimal Complications:  none immediate  See full dictation in [facility] PACS.  D. Devantae Babe MD Main # [phone] Pager  [phone]    

## 2017-05-23 NOTE — Discharge Instructions (Signed)
Implanted Port Insertion, Care After °This sheet gives you information about how to care for yourself after your procedure. Your health care provider may also give you more specific instructions. If you have problems or questions, contact your health care provider. °What can I expect after the procedure? °After your procedure, it is common to have: °· Discomfort at the port insertion site. °· Bruising on the skin over the port. This should improve over 3-4 days. ° °Follow these instructions at home: °Port care °· After your port is placed, you will get a manufacturer's information card. The card has information about your port. Keep this card with you at all times. °· Take care of the port as told by your health care provider. Ask your health care provider if you or a family member can get training for taking care of the port at home. A home health care nurse may also take care of the port. °· Make sure to remember what type of port you have. °Incision care °· Follow instructions from your health care provider about how to take care of your port insertion site. Make sure you: °? Wash your hands with soap and water before you change your bandage (dressing). If soap and water are not available, use hand sanitizer. °? Change your dressing as told by your health care provider. °? Leave stitches (sutures), skin glue, or adhesive strips in place. These skin closures may need to stay in place for 2 weeks or longer. If adhesive strip edges start to loosen and curl up, you may trim the loose edges. Do not remove adhesive strips completely unless your health care provider tells you to do that. °· Check your port insertion site every day for signs of infection. Check for: °? More redness, swelling, or pain. °? More fluid or blood. °? Warmth. °? Pus or a bad smell. °General instructions °· Do not take baths, swim, or use a hot tub until your health care provider approves. °· Do not lift anything that is heavier than 10 lb (4.5  kg) for a week, or as told by your health care provider. °· Ask your health care provider when it is okay to: °? Return to work or school. °? Resume usual physical activities or sports. °· Do not drive for 24 hours if you were given a medicine to help you relax (sedative). °· Take over-the-counter and prescription medicines only as told by your health care provider. °· Wear a medical alert bracelet in case of an emergency. This will tell any health care providers that you have a port. °· Keep all follow-up visits as told by your health care provider. This is important. °Contact a health care provider if: °· You cannot flush your port with saline as directed, or you cannot draw blood from the port. °· You have a fever or chills. °· You have more redness, swelling, or pain around your port insertion site. °· You have more fluid or blood coming from your port insertion site. °· Your port insertion site feels warm to the touch. °· You have pus or a bad smell coming from the port insertion site. °Get help right away if: °· You have chest pain or shortness of breath. °· You have bleeding from your port that you cannot control. °Summary °· Take care of the port as told by your health care provider. °· Change your dressing as told by your health care provider. °· Keep all follow-up visits as told by your health care provider. °  This information is not intended to replace advice given to you by your health care provider. Make sure you discuss any questions you have with your health care provider. °Document Released: 10/25/2012 Document Revised: 11/26/2015 Document Reviewed: 11/26/2015 °Elsevier Interactive Patient Education © 2017 [employer] °Implanted Port Home Guide °An implanted port is a type of central line that is placed under the skin. Central lines are used to provide IV access when treatment or nutrition needs to be given through a person’s veins. Implanted ports are used for long-term IV access. An implanted port  may be placed because: °· You need IV medicine that would be irritating to the small veins in your hands or arms. °· You need long-term IV medicines, such as antibiotics. °· You need IV nutrition for a long period. °· You need frequent blood draws for lab tests. °· You need dialysis. ° °Implanted ports are usually placed in the chest area, but they can also be placed in the upper arm, the abdomen, or the leg. An implanted port has two main parts: °· Reservoir. The reservoir is round and will appear as a small, raised area under your skin. The reservoir is the part where a needle is inserted to give medicines or draw blood. °· Catheter. The catheter is a thin, flexible tube that extends from the reservoir. The catheter is placed into a large vein. Medicine that is inserted into the reservoir goes into the catheter and then into the vein. ° °How will I care for my incision site? °Do not get the incision site wet. Bathe or shower as directed by your health care provider. °How is my port accessed? °Special steps must be taken to access the port: °· Before the port is accessed, a numbing cream can be placed on the skin. This helps numb the skin over the port site. °· Your health care provider uses a sterile technique to access the port. °? Your health care provider must put on a mask and sterile gloves. °? The skin over your port is cleaned carefully with an antiseptic and allowed to dry. °? The port is gently pinched between sterile gloves, and a needle is inserted into the port. °· Only "non-coring" port needles should be used to access the port. Once the port is accessed, a blood return should be checked. This helps ensure that the port is in the vein and is not clogged. °· If your port needs to remain accessed for a constant infusion, a clear (transparent) bandage will be placed over the needle site. The bandage and needle will need to be changed every week, or as directed by your health care provider. °· Keep the  bandage covering the needle clean and dry. Do not get it wet. Follow your health care provider’s instructions on how to take a shower or bath while the port is accessed. °· If your port does not need to stay accessed, no bandage is needed over the port. ° °What is flushing? °Flushing helps keep the port from getting clogged. Follow your health care provider’s instructions on how and when to flush the port. Ports are usually flushed with saline solution or a medicine called heparin. The need for flushing will depend on how the port is used. °· If the port is used for intermittent medicines or blood draws, the port will need to be flushed: °? After medicines have been given. °? After blood has been drawn. °? As part of routine maintenance. °· If a constant infusion is   running, the port may not need to be flushed. ° °How long will my port stay implanted? °The port can stay in for as long as your health care provider thinks it is needed. When it is time for the port to come out, surgery will be done to remove it. The procedure is similar to the one performed when the port was put in. °When should I seek immediate medical care? °When you have an implanted port, you should seek immediate medical care if: °· You notice a bad smell coming from the incision site. °· You have swelling, redness, or drainage at the incision site. °· You have more swelling or pain at the port site or the surrounding area. °· You have a fever that is not controlled with medicine. ° °This information is not intended to replace advice given to you by your health care provider. Make sure you discuss any questions you have with your health care provider. °Document Released: 01/04/2005 Document Revised: 06/12/2015 Document Reviewed: 09/11/2012 °Elsevier Interactive Patient Education © 2017 [employer] °Moderate Conscious Sedation, Adult, Care After °These instructions provide you with information about caring for yourself after your procedure. Your  health care provider may also give you more specific instructions. Your treatment has been planned according to current medical practices, but problems sometimes occur. Call your health care provider if you have any problems or questions after your procedure. °What can I expect after the procedure? °After your procedure, it is common: °· To feel sleepy for several hours. °· To feel clumsy and have poor balance for several hours. °· To have poor judgment for several hours. °· To vomit if you eat too soon. ° °Follow these instructions at home: °For at least 24 hours after the procedure: ° °· Do not: °? Participate in activities where you could fall or become injured. °? Drive. °? Use heavy machinery. °? Drink alcohol. °? Take sleeping pills or medicines that cause drowsiness. °? Make important decisions or sign legal documents. °? Take care of children on your own. °· Rest. °Eating and drinking °· Follow the diet recommended by your health care provider. °· If you vomit: °? Drink water, juice, or soup when you can drink without vomiting. °? Make sure you have little or no nausea before eating solid foods. °General instructions °· Have a responsible adult stay with you until you are awake and alert. °· Take over-the-counter and prescription medicines only as told by your health care provider. °· If you smoke, do not smoke without supervision. °· Keep all follow-up visits as told by your health care provider. This is important. °Contact a health care provider if: °· You keep feeling nauseous or you keep vomiting. °· You feel light-headed. °· You develop a rash. °· You have a fever. °Get help right away if: °· You have trouble breathing. °This information is not intended to replace advice given to you by your health care provider. Make sure you discuss any questions you have with your health care provider. °Document Released: 10/25/2012 Document Revised: 06/09/2015 Document Reviewed: 04/26/2015 °Elsevier Interactive  Patient Education © 2018 [employer]. ° °

## 2017-05-23 NOTE — H&P (Signed)
Referring Physician(s): Sherrill,B  Supervising Physician: Arne Cleveland  Patient Status:  [facility]  Chief Complaint:  "I'm getting a port"  Subjective: Patient familiar to IR service from prior left iliac bone lesion biopsy in 2015.  She has a history of left breast cancer diagnosed initially in 2010, status post surgery and chemotherapy.  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  She currently denies fever, headache, chills, chest pain, dyspnea, cough, abdominal pain, nausea, vomiting or bleeding.  She does have intermittent back pain.  Past Medical History:  Diagnosis Date  . Arthritis   . Bone metastases ([place])    breast primary  . Breast cancer ([place])   . Cancer ([place])    left breast  . Diabetes mellitus   . History of radiation therapy 10/2008   left chest wall, left supraclavicular region, mastectomy scar  . Hyperlipidemia   . Hypertension    Past Surgical History:  Procedure Laterality Date  . BONE DENSITY TEST  2008  . BREAST SURGERY    . CHOLECYSTECTOMY    . COLONOSCOPY  2007  . MASTECTOMY  2010   LEFT BREAST  . PORT-A-CATH REMOVAL    . PORTACATH PLACEMENT    . REPLACEMENT TOTAL KNEE  2008   RIGHT KNEE  . TOTAL HIP ARTHROPLASTY  2004   RIGHT HIP    Allergies: Doxycycline  Medications: Prior to Admission medications   Medication Sig Start Date End Date Taking? Authorizing Provider  Ascorbic Acid (VITAMIN C) 500 MG tablet Take 500 mg by mouth daily.     Yes [provider]  diazepam (VALIUM) 5 MG tablet Take 1 tablet (5 mg total) by mouth at bedtime as needed for anxiety. 05/09/17  Yes Ladell Pier, MD  diphenhydrAMINE (BENADRYL) 25 mg capsule Take 25 mg by mouth at bedtime as needed for sleep.   Yes [provider]  glipiZIDE (GLUCOTROL) 5 MG tablet Take 5 mg by mouth daily before breakfast. Reported on 07/24/2015   Yes [provider]  Multiple Vitamins-Minerals (CENTRUM SILVER PO) Take 1 tablet by mouth daily.    Yes [provider]  oxyCODONE (OXYCONTIN) 20 mg 12 hr tablet Take 1 tablet (20 mg total) by mouth every 12 (twelve) hours. 05/09/17  Yes Ladell Pier, MD  oxyCODONE-acetaminophen (PERCOCET) 10-325 MG tablet Take 1 tablet by mouth every 4 (four) hours as needed for pain. Dx: S28.768, C79.51 05/09/17  Yes Ladell Pier, MD  prochlorperazine (COMPAZINE) 10 MG tablet Take 1 tablet (10 mg total) by mouth every 6 (six) hours as needed for nausea or vomiting. 02/09/17  Yes Ladell Pier, MD  quinapril (ACCUPRIL) 20 MG tablet Take 20 mg by mouth at bedtime.   Yes [provider]  rosuvastatin (CRESTOR) 10 MG tablet  04/11/17  Yes [provider]  Calcium-Magnesium-Vitamin D (CALCIUM 500 PO) Take 1 tablet by mouth daily.    [provider]  dexamethasone (DECADRON) 4 MG tablet Take 10 mg (2.5 tabs) at 10pm night prior to first chemo & 6am morning of first chemo 05/17/17   Owens Shark, NP  diclofenac sodium (VOLTAREN) 1 % GEL Apply 2 g topically as needed (for left hip, hands,shoulders,back).     [provider]  predniSONE (DELTASONE) 1 MG tablet TAKE 1 TABLET (1 MG TOTAL) BY MOUTH DAILY. 11/05/16   Bo Merino, MD  XELODA 500 MG tablet TAKE 2 TABLETS(1000MG )  BY MOUTH TWICE DAILY AFTER A MEAL ON DAYS 1-7 AND DAYS 15-21 05/05/17   Ladell Pier, MD     Vital Signs: BP (!) 144/85   Pulse (!) 101   Temp 98.7 F (37.1 C) (Oral)   Resp 16   SpO2 98%   Physical Exam awake, alert.  Chest clear to auscultation bilaterally.  Heart with slightly tachycardic but regular rhythm.  Abdomen obese, soft, positive bowel sounds, nontender.  No lower extremity edema.  Imaging: No results found.  Labs:  CBC: Recent Labs    03/23/17 1329 04/19/17 1218 05/17/17 1222 05/23/17 1230  WBC 4.8 6.8 4.8 6.8  HGB 12.2 11.4* 11.7 11.9*  HCT 37.5 34.9 35.4 36.5    PLT 148 206 196 226    COAGS: Recent Labs    05/23/17 1230  INR 0.96    BMP: Recent Labs    02/24/17 1034 03/23/17 1329 04/19/17 1218 05/17/17 1222  NA 141 138 138 138  K 3.9 4.4 4.0 4.2  CL 104 104 103 104  CO2 26 22 24 25   GLUCOSE 107 70 67* 81  BUN 26 22 20 18   CALCIUM 9.3 10.5* 9.6 8.8  CREATININE 1.39* 1.21* 1.32* 1.24*  GFRNONAA 39* 46* 41* 44*  GFRAA 45* 53* 48* 51*    LIVER FUNCTION TESTS: Recent Labs    02/24/17 1034 03/23/17 1329 04/19/17 1218 05/17/17 1222  BILITOT 0.4 0.5 0.4 0.4  AST 19 18 17 22   ALT 13 8 9 10   ALKPHOS 112 109 110 141  PROT 6.8 7.4 7.3 7.4  ALBUMIN 3.5 3.8 3.7 3.8    Assessment and Plan: Pt with history of left breast cancer diagnosed initially in 2010, status post surgery and chemotherapy.  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  Risks and benefits of image guided port-a-catheter placement was discussed with the patient/family including, but not limited to bleeding, infection, pneumothorax, or fibrin sheath development and need for additional procedures.  All of the patient's questions were answered, patient is agreeable to proceed. Consent signed and in chart.     Electronically Signed: D. Rowe Robert, PA-C 05/23/2017, 1:16 PM   I spent a total of 20 minutes at the the patient's bedside AND on the patient's hospital floor or unit, greater than 50% of which was counseling/coordinating care for Port-A-Cath placement

## 2017-05-24 ENCOUNTER — Other Ambulatory Visit: Payer: Self-pay | Admitting: Medical Oncology

## 2017-05-24 DIAGNOSIS — Z95828 Presence of other vascular implants and grafts: Secondary | ICD-10-CM

## 2017-05-24 MED ORDER — LIDOCAINE-PRILOCAINE 2.5-2.5 % EX CREA: 1.0000 "application " | TOPICAL_CREAM | CUTANEOUS | 0 refills | Status: DC | PRN

## 2017-05-25 MED FILL — LIDOCAINE-PRILOCAINE CREAM: 2.5-2.5 | 7 days supply | Qty: 30 | Fill #0

## 2017-05-29 ENCOUNTER — Other Ambulatory Visit: Payer: Self-pay | Admitting: Oncology

## 2017-05-30 ENCOUNTER — Inpatient Hospital Stay: Payer: Medicare Other | Attending: Oncology

## 2017-05-30 ENCOUNTER — Inpatient Hospital Stay: Payer: Medicare Other

## 2017-05-30 ENCOUNTER — Inpatient Hospital Stay: Payer: Medicare Other | Admitting: Nutrition

## 2017-05-30 VITALS — BP 158/85 | HR 78 | Temp 98.6°F | Resp 18

## 2017-05-30 DIAGNOSIS — C7951 Secondary malignant neoplasm of bone: Secondary | ICD-10-CM | POA: Diagnosis not present

## 2017-05-30 DIAGNOSIS — Z5111 Encounter for antineoplastic chemotherapy: Secondary | ICD-10-CM | POA: Diagnosis not present

## 2017-05-30 DIAGNOSIS — C50919 Malignant neoplasm of unspecified site of unspecified female breast: Secondary | ICD-10-CM | POA: Insufficient documentation

## 2017-05-30 DIAGNOSIS — C787 Secondary malignant neoplasm of liver and intrahepatic bile duct: Secondary | ICD-10-CM | POA: Insufficient documentation

## 2017-05-30 LAB — CMP (CANCER CENTER ONLY)
ALBUMIN: 3.7 g/dL (ref 3.5–5.0)
ALK PHOS: 128 U/L (ref 40–150)
ALT: 7 U/L (ref 0–55)
ANION GAP: 10 (ref 3–11)
AST: 16 U/L (ref 5–34)
BUN: 22 mg/dL (ref 7–26)
CALCIUM: 9.4 mg/dL (ref 8.4–10.4)
CO2: 22 mmol/L (ref 22–29)
CREATININE: 1.3 mg/dL — AB (ref 0.60–1.10)
Chloride: 106 mmol/L (ref 98–109)
GFR, Est AFR Am: 48 mL/min — ABNORMAL LOW (ref >60)
GFR, Estimated: 42 mL/min — ABNORMAL LOW (ref >60)
GLUCOSE: 289 mg/dL — AB (ref 70–140)
Potassium: 4 mmol/L (ref 3.5–5.1)
SODIUM: 138 mmol/L (ref 136–145)
Total Bilirubin: 0.3 mg/dL (ref 0.2–1.2)
Total Protein: 7.7 g/dL (ref 6.4–8.3)

## 2017-05-30 LAB — CBC WITH DIFFERENTIAL (CANCER CENTER ONLY)
BASOS PCT: 0 %
Basophils Absolute: 0 10*3/uL (ref 0.0–0.1)
Eosinophils Absolute: 0 10*3/uL (ref 0.0–0.5)
Eosinophils Relative: 0 %
HEMATOCRIT: 37.5 % (ref 34.8–46.6)
Hemoglobin: 12.3 g/dL (ref 11.6–15.9)
Lymphocytes Relative: 8 %
Lymphs Abs: 0.3 10*3/uL — ABNORMAL LOW (ref 0.9–3.3)
MCH: 33.3 pg (ref 25.1–34.0)
MCHC: 32.8 g/dL (ref 31.5–36.0)
MCV: 101.6 fL — ABNORMAL HIGH (ref 79.5–101.0)
MONO ABS: 0 10*3/uL — AB (ref 0.1–0.9)
MONOS PCT: 1 %
NEUTROS ABS: 3 10*3/uL (ref 1.5–6.5)
Neutrophils Relative %: 91 %
Platelet Count: 212 10*3/uL (ref 145–400)
RBC: 3.69 MIL/uL — ABNORMAL LOW (ref 3.70–5.45)
RDW: 15.7 % — ABNORMAL HIGH (ref 11.2–14.5)
WBC Count: 3.3 10*3/uL — ABNORMAL LOW (ref 3.9–10.3)

## 2017-05-30 MED ORDER — SODIUM CHLORIDE 0.9 % IV SOLN
Freq: Once | INTRAVENOUS | Status: AC
  Administered 2017-05-30: 15:00:00 via INTRAVENOUS

## 2017-05-30 MED ORDER — SODIUM CHLORIDE 0.9% FLUSH
10.0000 mL | INTRAVENOUS | Status: DC | PRN
  Administered 2017-05-30: 10 mL
  Filled 2017-05-30: qty 10

## 2017-05-30 MED ORDER — DIPHENHYDRAMINE HCL 50 MG/ML IJ SOLN
25.0000 mg | Freq: Once | INTRAMUSCULAR | Status: AC
Start: 2017-05-30 — End: 2017-05-30
  Administered 2017-05-30: 25 mg via INTRAVENOUS

## 2017-05-30 MED ORDER — DEXAMETHASONE SODIUM PHOSPHATE 10 MG/ML IJ SOLN
10.0000 mg | Freq: Once | INTRAMUSCULAR | Status: AC
  Administered 2017-05-30: 10 mg via INTRAVENOUS

## 2017-05-30 MED ORDER — DIPHENHYDRAMINE HCL 50 MG/ML IJ SOLN
INTRAMUSCULAR | Status: AC
  Filled 2017-05-30: qty 1

## 2017-05-30 MED ORDER — HEPARIN SOD (PORK) LOCK FLUSH 100 UNIT/ML IV SOLN
500.0000 [IU] | Freq: Once | INTRAVENOUS | Status: AC | PRN
  Administered 2017-05-30: 500 [IU]
  Filled 2017-05-30: qty 5

## 2017-05-30 MED ORDER — DEXAMETHASONE SODIUM PHOSPHATE 10 MG/ML IJ SOLN
INTRAMUSCULAR | Status: AC
  Filled 2017-05-30: qty 1

## 2017-05-30 MED ORDER — SODIUM CHLORIDE 0.9 % IV SOLN
80.0000 mg/m2 | Freq: Once | INTRAVENOUS | Status: AC
  Administered 2017-05-30: 162 mg via INTRAVENOUS
  Filled 2017-05-30: qty 27

## 2017-05-30 MED ORDER — FAMOTIDINE IN NACL 20-0.9 MG/50ML-% IV SOLN
INTRAVENOUS | Status: AC
  Filled 2017-05-30: qty 50

## 2017-05-30 MED ORDER — FAMOTIDINE IN NACL 20-0.9 MG/50ML-% IV SOLN
20.0000 mg | Freq: Once | INTRAVENOUS | Status: AC
  Administered 2017-05-30: 20 mg via INTRAVENOUS

## 2017-05-30 NOTE — Progress Notes (Signed)
67 year old female diagnosed with met breast cancer.  PMH includes DM, hypercholesterolemia, HTN, and Anemia  Medications include Vit C, Vit D, Calsium, Glucotrol, MVI, Prednisone, Compazine, and Crestor.  Labs include Glucose 110, BUN 21, Creat 1.35 on May 6.  Height:  62.5 inches Weight: 207.8 pounds April 30. UBW: 215 pounds in March.   BMI: 37.4 pounds.  Patient reports her diarrhea has improved since she discontinued Xeloda. She does have a decreased appetite but reports it is improving. Patient is trying to eat more often.  Nutrition Diagnosis: Food and nutrition related knowledge deficit related to metastatic breast cancer as evidenced by no prior need for nutrition related information.  Intervention: Educated patient on the importance of small frequent meals and snacks with adequate calories and protein. Reviewed appropriate snacks. Provided fact sheets on increasing calories and protein. Education provided on strategies for improving diarrhea.  Patient was given a fact sheet. Questions were answered.  Teach back method used.  Contact information provided. Monitoring, Evaluation, Goals:  Next Visit: To be scheduled as needed.  Patient will call me for questions or concerns.  **Disclaimer: This note was dictated with voice recognition software. Similar sounding words can inadvertently be transcribed and this note may contain transcription errors which may not have been corrected upon publication of note.**

## 2017-05-30 NOTE — Progress Notes (Signed)
BP rising during first 30 minutes of infusion. Reading is 165/77 at 1644. Dr. Benay Spice notified and is ok with [place] continuing to titrate her infusion as long as she remains free of symptoms of infusion hypersensitivity.

## 2017-05-30 NOTE — Patient Instructions (Signed)
[facility] Discharge Instructions for Patients Receiving Chemotherapy  Today you received the following chemotherapy agent: Taxol.  To help prevent nausea and vomiting after your treatment, we encourage you to take your nausea medication as prescribed.   If you develop nausea and vomiting that is not controlled by your nausea medication, call the clinic.   BELOW ARE SYMPTOMS THAT SHOULD BE REPORTED IMMEDIATELY:  *FEVER GREATER THAN 100.5 F  *CHILLS WITH OR WITHOUT FEVER  NAUSEA AND VOMITING THAT IS NOT CONTROLLED WITH YOUR NAUSEA MEDICATION  *UNUSUAL SHORTNESS OF BREATH  *UNUSUAL BRUISING OR BLEEDING  TENDERNESS IN MOUTH AND THROAT WITH OR WITHOUT PRESENCE OF ULCERS  *URINARY PROBLEMS  *BOWEL PROBLEMS  UNUSUAL RASH Items with * indicate a potential emergency and should be followed up as soon as possible.  Feel free to call the clinic should you have any questions or concerns. The clinic phone number is [phone].  Please show the [facility] at check-in to the Emergency Department and triage nurse.  Paclitaxel (Taxol) injection What is this medicine? PACLITAXEL (PAK li TAX el) is a chemotherapy drug. It targets fast dividing cells, like cancer cells, and causes these cells to die. This medicine is used to treat ovarian cancer, breast cancer, and other cancers. This medicine may be used for other purposes; ask your health care provider or pharmacist if you have questions. COMMON BRAND NAME(S): Onxol, Taxol What should I tell my health care provider before I take this medicine? They need to know if you have any of these conditions: -blood disorders -irregular heartbeat -infection (especially a virus infection such as chickenpox, cold sores, or herpes) -liver disease -previous or ongoing radiation therapy -an unusual or allergic reaction to paclitaxel, alcohol, polyoxyethylated castor oil, other chemotherapy agents, other medicines, foods, dyes, or  preservatives -pregnant or trying to get pregnant -breast-feeding How should I use this medicine? This drug is given as an infusion into a vein. It is administered in a hospital or clinic by a specially trained health care professional. Talk to your pediatrician regarding the use of this medicine in children. Special care may be needed. Overdosage: If you think you have taken too much of this medicine contact a poison control center or emergency room at once. NOTE: This medicine is only for you. Do not share this medicine with others. What if I miss a dose? It is important not to miss your dose. Call your doctor or health care professional if you are unable to keep an appointment. What may interact with this medicine? Do not take this medicine with any of the following medications: -disulfiram -metronidazole This medicine may also interact with the following medications: -cyclosporine -diazepam -ketoconazole -medicines to increase blood counts like filgrastim, pegfilgrastim, sargramostim -other chemotherapy drugs like cisplatin, doxorubicin, epirubicin, etoposide, teniposide, vincristine -quinidine -testosterone -vaccines -verapamil Talk to your doctor or health care professional before taking any of these medicines: -acetaminophen -aspirin -ibuprofen -ketoprofen -naproxen This list may not describe all possible interactions. Give your health care provider a list of all the medicines, herbs, non-prescription drugs, or dietary supplements you use. Also tell them if you smoke, drink alcohol, or use illegal drugs. Some items may interact with your medicine. What should I watch for while using this medicine? Your condition will be monitored carefully while you are receiving this medicine. You will need important blood work done while you are taking this medicine. This medicine can cause serious allergic reactions. To reduce your risk you will need to  take other medicine(s) before  treatment with this medicine. If you experience allergic reactions like skin rash, itching or hives, swelling of the face, lips, or tongue, tell your doctor or health care professional right away. In some cases, you may be given additional medicines to help with side effects. Follow all directions for their use. This drug may make you feel generally unwell. This is not uncommon, as chemotherapy can affect healthy cells as well as cancer cells. Report any side effects. Continue your course of treatment even though you feel ill unless your doctor tells you to stop. Call your doctor or health care professional for advice if you get a fever, chills or sore throat, or other symptoms of a cold or flu. Do not treat yourself. This drug decreases your body's ability to fight infections. Try to avoid being around people who are sick. This medicine may increase your risk to bruise or bleed. Call your doctor or health care professional if you notice any unusual bleeding. Be careful brushing and flossing your teeth or using a toothpick because you may get an infection or bleed more easily. If you have any dental work done, tell your dentist you are receiving this medicine. Avoid taking products that contain aspirin, acetaminophen, ibuprofen, naproxen, or ketoprofen unless instructed by your doctor. These medicines may hide a fever. Do not become pregnant while taking this medicine. Women should inform their doctor if they wish to become pregnant or think they might be pregnant. There is a potential for serious side effects to an unborn child. Talk to your health care professional or pharmacist for more information. Do not breast-feed an infant while taking this medicine. Men are advised not to father a child while receiving this medicine. This product may contain alcohol. Ask your pharmacist or healthcare provider if this medicine contains alcohol. Be sure to tell all healthcare providers you are taking this medicine.  Certain medicines, like metronidazole and disulfiram, can cause an unpleasant reaction when taken with alcohol. The reaction includes flushing, headache, nausea, vomiting, sweating, and increased thirst. The reaction can last from 30 minutes to several hours. What side effects may I notice from receiving this medicine? Side effects that you should report to your doctor or health care professional as soon as possible: -allergic reactions like skin rash, itching or hives, swelling of the face, lips, or tongue -low blood counts - This drug may decrease the number of white blood cells, red blood cells and platelets. You may be at increased risk for infections and bleeding. -signs of infection - fever or chills, cough, sore throat, pain or difficulty passing urine -signs of decreased platelets or bleeding - bruising, pinpoint red spots on the skin, black, tarry stools, nosebleeds -signs of decreased red blood cells - unusually weak or tired, fainting spells, lightheadedness -breathing problems -chest pain -high or low blood pressure -mouth sores -nausea and vomiting -pain, swelling, redness or irritation at the injection site -pain, tingling, numbness in the hands or feet -slow or irregular heartbeat -swelling of the ankle, feet, hands Side effects that usually do not require medical attention (report to your doctor or health care professional if they continue or are bothersome): -bone pain -complete hair loss including hair on your head, underarms, pubic hair, eyebrows, and eyelashes -changes in the color of fingernails -diarrhea -loosening of the fingernails -loss of appetite -muscle or joint pain -red flush to skin -sweating This list may not describe all possible side effects. Call your doctor for medical advice about   side effects. You may report side effects to FDA at 1-800-FDA-1088. Where should I keep my medicine? This drug is given in a hospital or clinic and will not be stored at  home. NOTE: This sheet is a summary. It may not cover all possible information. If you have questions about this medicine, talk to your doctor, pharmacist, or health care provider.  2018 [employer]vier/Gold Standard (2014-11-05 19:58:00)   

## 2017-05-31 ENCOUNTER — Telehealth: Payer: Self-pay | Admitting: *Deleted

## 2017-05-31 NOTE — Telephone Encounter (Signed)
-----   Message from Ma Hillock, RN sent at 05/30/2017  5:46 PM EDT ----- Regarding: [name]: 1st Chemo F/U Contact: [phone] Please call patient to follow up after 1st taxol. Tolerated tx. Without any complications.

## 2017-05-31 NOTE — Telephone Encounter (Signed)
Called pt, she reports loose stools this AM. Took imodium. Blood sugar was 194 this AM. She will check it again this afternoon. Pt reports she is drinking lots of water. Has compazine to take PRN. Encouraged her to call office for symptom management if needed.

## 2017-06-03 ENCOUNTER — Other Ambulatory Visit: Payer: Self-pay | Admitting: *Deleted

## 2017-06-03 DIAGNOSIS — C7951 Secondary malignant neoplasm of bone: Principal | ICD-10-CM

## 2017-06-03 DIAGNOSIS — C50919 Malignant neoplasm of unspecified site of unspecified female breast: Secondary | ICD-10-CM

## 2017-06-05 ENCOUNTER — Other Ambulatory Visit: Payer: Self-pay | Admitting: Oncology

## 2017-06-06 ENCOUNTER — Inpatient Hospital Stay (HOSPITAL_BASED_OUTPATIENT_CLINIC_OR_DEPARTMENT_OTHER): Payer: Medicare Other | Admitting: Nurse Practitioner

## 2017-06-06 ENCOUNTER — Encounter: Payer: Self-pay | Admitting: Nurse Practitioner

## 2017-06-06 ENCOUNTER — Inpatient Hospital Stay: Payer: Medicare Other

## 2017-06-06 DIAGNOSIS — L539 Erythematous condition, unspecified: Secondary | ICD-10-CM | POA: Diagnosis not present

## 2017-06-06 DIAGNOSIS — C50919 Malignant neoplasm of unspecified site of unspecified female breast: Secondary | ICD-10-CM

## 2017-06-06 DIAGNOSIS — C7951 Secondary malignant neoplasm of bone: Secondary | ICD-10-CM

## 2017-06-06 DIAGNOSIS — Z5111 Encounter for antineoplastic chemotherapy: Secondary | ICD-10-CM | POA: Diagnosis not present

## 2017-06-06 DIAGNOSIS — C50912 Malignant neoplasm of unspecified site of left female breast: Secondary | ICD-10-CM

## 2017-06-06 DIAGNOSIS — I10 Essential (primary) hypertension: Secondary | ICD-10-CM | POA: Diagnosis not present

## 2017-06-06 DIAGNOSIS — G893 Neoplasm related pain (acute) (chronic): Secondary | ICD-10-CM

## 2017-06-06 DIAGNOSIS — E119 Type 2 diabetes mellitus without complications: Secondary | ICD-10-CM | POA: Diagnosis not present

## 2017-06-06 DIAGNOSIS — Z95828 Presence of other vascular implants and grafts: Secondary | ICD-10-CM

## 2017-06-06 LAB — CMP (CANCER CENTER ONLY)
ALBUMIN: 3.6 g/dL (ref 3.5–5.0)
ALK PHOS: 120 U/L (ref 40–150)
ALT: 14 U/L (ref 0–55)
AST: 17 U/L (ref 5–34)
Anion gap: 9 (ref 3–11)
BUN: 21 mg/dL (ref 7–26)
CALCIUM: 10.3 mg/dL (ref 8.4–10.4)
CHLORIDE: 105 mmol/L (ref 98–109)
CO2: 24 mmol/L (ref 22–29)
CREATININE: 1.18 mg/dL — AB (ref 0.60–1.10)
GFR, Est AFR Am: 54 mL/min — ABNORMAL LOW (ref >60)
GFR, Estimated: 47 mL/min — ABNORMAL LOW (ref >60)
GLUCOSE: 92 mg/dL (ref 70–140)
Potassium: 4.2 mmol/L (ref 3.5–5.1)
SODIUM: 138 mmol/L (ref 136–145)
Total Bilirubin: 0.3 mg/dL (ref 0.2–1.2)
Total Protein: 7 g/dL (ref 6.4–8.3)

## 2017-06-06 LAB — CBC WITH DIFFERENTIAL (CANCER CENTER ONLY)
Basophils Absolute: 0 10*3/uL (ref 0.0–0.1)
Basophils Relative: 0 %
Eosinophils Absolute: 0.1 10*3/uL (ref 0.0–0.5)
Eosinophils Relative: 1 %
HEMATOCRIT: 33.8 % — AB (ref 34.8–46.6)
Hemoglobin: 11.3 g/dL — ABNORMAL LOW (ref 11.6–15.9)
LYMPHS ABS: 0.3 10*3/uL — AB (ref 0.9–3.3)
LYMPHS PCT: 5 %
MCH: 33.2 pg (ref 25.1–34.0)
MCHC: 33.4 g/dL (ref 31.5–36.0)
MCV: 99.3 fL (ref 79.5–101.0)
MONOS PCT: 2 %
Monocytes Absolute: 0.1 10*3/uL (ref 0.1–0.9)
NEUTROS PCT: 92 %
Neutro Abs: 4.5 10*3/uL (ref 1.5–6.5)
PLATELETS: 183 10*3/uL (ref 145–400)
RBC: 3.4 MIL/uL — ABNORMAL LOW (ref 3.70–5.45)
RDW: 15.2 % — AB (ref 11.2–14.5)
WBC: 4.9 10*3/uL (ref 3.9–10.3)

## 2017-06-06 MED ORDER — FAMOTIDINE IN NACL 20-0.9 MG/50ML-% IV SOLN
INTRAVENOUS | Status: AC
  Filled 2017-06-06: qty 50

## 2017-06-06 MED ORDER — DIAZEPAM 5 MG PO TABS: 5.0000 mg | ORAL_TABLET | Freq: Every evening | ORAL | 0 refills | Status: DC | PRN

## 2017-06-06 MED ORDER — OXYCODONE HCL ER 20 MG PO T12A: 20.0000 mg | EXTENDED_RELEASE_TABLET | Freq: Two times a day (BID) | ORAL | 0 refills | Status: DC

## 2017-06-06 MED ORDER — SODIUM CHLORIDE 0.9% FLUSH
10.0000 mL | INTRAVENOUS | Status: DC | PRN
  Administered 2017-06-06: 10 mL via INTRAVENOUS
  Filled 2017-06-06: qty 10

## 2017-06-06 MED ORDER — OXYCODONE-ACETAMINOPHEN 10-325 MG PO TABS: 1.0000 | ORAL_TABLET | ORAL | 0 refills | Status: DC | PRN

## 2017-06-06 MED ORDER — FAMOTIDINE IN NACL 20-0.9 MG/50ML-% IV SOLN
20.0000 mg | Freq: Once | INTRAVENOUS | Status: AC
  Administered 2017-06-06: 20 mg via INTRAVENOUS

## 2017-06-06 MED ORDER — SODIUM CHLORIDE 0.9% FLUSH
10.0000 mL | INTRAVENOUS | Status: DC | PRN
  Administered 2017-06-06: 10 mL
  Filled 2017-06-06: qty 10

## 2017-06-06 MED ORDER — SODIUM CHLORIDE 0.9 % IV SOLN
Freq: Once | INTRAVENOUS | Status: AC
  Administered 2017-06-06: 12:00:00 via INTRAVENOUS

## 2017-06-06 MED ORDER — DIPHENHYDRAMINE HCL 50 MG/ML IJ SOLN
INTRAMUSCULAR | Status: AC
  Filled 2017-06-06: qty 1

## 2017-06-06 MED ORDER — DEXAMETHASONE SODIUM PHOSPHATE 10 MG/ML IJ SOLN
10.0000 mg | Freq: Once | INTRAMUSCULAR | Status: AC
  Administered 2017-06-06: 10 mg via INTRAVENOUS

## 2017-06-06 MED ORDER — SODIUM CHLORIDE 0.9 % IV SOLN
80.0000 mg/m2 | Freq: Once | INTRAVENOUS | Status: AC
  Administered 2017-06-06: 162 mg via INTRAVENOUS
  Filled 2017-06-06: qty 27

## 2017-06-06 MED ORDER — HEPARIN SOD (PORK) LOCK FLUSH 100 UNIT/ML IV SOLN
500.0000 [IU] | Freq: Once | INTRAVENOUS | Status: DC
  Filled 2017-06-06: qty 5

## 2017-06-06 MED ORDER — DIPHENHYDRAMINE HCL 50 MG/ML IJ SOLN
25.0000 mg | Freq: Once | INTRAMUSCULAR | Status: AC
  Administered 2017-06-06: 25 mg via INTRAVENOUS

## 2017-06-06 MED ORDER — ALTEPLASE 2 MG IJ SOLR
2.0000 mg | Freq: Once | INTRAMUSCULAR | Status: DC | PRN
  Filled 2017-06-06: qty 2

## 2017-06-06 MED ORDER — HEPARIN SOD (PORK) LOCK FLUSH 100 UNIT/ML IV SOLN
500.0000 [IU] | Freq: Once | INTRAVENOUS | Status: AC | PRN
  Administered 2017-06-06: 500 [IU]
  Filled 2017-06-06: qty 5

## 2017-06-06 MED ORDER — DEXAMETHASONE SODIUM PHOSPHATE 10 MG/ML IJ SOLN
INTRAMUSCULAR | Status: AC
  Filled 2017-06-06: qty 1

## 2017-06-06 NOTE — Progress Notes (Addendum)
[facility] OFFICE PROGRESS NOTE   Diagnosis: Breast cancer  INTERVAL HISTORY:   [name] returns as scheduled.  She began cycle 1 weekly Taxol 05/30/2017.  She denies nausea/vomiting.  No mouth sores.  No constipation or diarrhea.  No signs of an allergic reaction.  She had bone pain lasting about 3 days. Also fatigue during this time.  Objective:  Vital signs in last 24 hours:  Blood pressure (!) 108/50, pulse 84, temperature 98.5 F (36.9 C), temperature source Oral, resp. rate 17, height 5' 2.5" (1.588 m), weight 202 lb 3.2 oz (91.7 kg), SpO2 98 %.    HEENT: No thrush or ulcers. Resp: Lungs clear bilaterally. Cardio: Regular rate and rhythm. GI: Abdomen soft and nontender.  No hepatomegaly. Vascular: No leg edema. Port-A-Cath without erythema.  Skin tear/erythema consistent with a tape reaction superior to the actual Port-A-Cath site.  Lab Results:  Lab Results  Component Value Date   WBC 4.9 06/06/2017   HGB 11.3 (L) 06/06/2017   HCT 33.8 (L) 06/06/2017   MCV 99.3 06/06/2017   PLT 183 06/06/2017   NEUTROABS 4.5 06/06/2017    Imaging:  No results found.  Medications: I have reviewed the patient's current medications.  Assessment/Plan: 1. 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 ER positive, PR positive, HER-2 negative She completed 4 cycles of adjuvant AC chemotherapy June 15 through September 03, 2008, and then completed the left chest wall radiation. She began Arimidex following an office visit on November 11, 2008.  Bone scan 06/01/2013 suggestive of thoracic spine metastases, thoracic MRI 06/27/2011 consistent with multiple bone metastases involving the thoracolumbar spine   PET scan 07/09/2013 with multiple hypermetabolic bone lesions and hypermetabolic mediastinal nodes.   Left iliac lesion biopsy 07/26/2013.  Pathology showed metastatic carcinoma, ER positive, PR positive, HER-2 negative   Initiation of Faslodex 08/10/2013.  Xgeva every 3 months initiated 07/27/2013.  Restaging PET scan 01/23/2014 with no residual hypermetabolic mediastinal disease; marked improvement in the metastatic bone disease.  Restaging PET scan 02/17/2015 with recurrence of skeletal metastasis. New hypermetabolic lesions within the pelvis and ribs. No hypermetabolic lymphadenopathy.  Continuation of Faslodex; initiation of Ibrance 03/05/2015  Cycle 2 Ibrance 04/09/2015-dose reduced to 100 mg daily  Cycle 3 Ibrance 05/08/2015  Cycle 4 Ibrance 06/11/2015  Cycle 5 Ibrance 07/15/2015  PET scan 08/12/2015-increase in size and increased metabolic activity associated with bone metastases, no new lesions  Ibrance/Faslodex discontinued  Tamoxifen started 08/07/2015  PET scan 02/04/2016 with new hypermetabolic liver lesions and numerous new skeletal lesions.  Xeloda, 7 days on/7 days off initiated February 2018  Xeloda dose reduced secondary to hand/foot syndrome beginning on 05/04/2016  Restaging MRI liver 05/18/2016-2 dominant hepatic metastases measuring up to 13 mm favored to be improved.  Xeloda continued  PET scan 11/12/2016-significant improvement with complete resolution of multiple hepatic hypermetabolic lesions and most of the prior bony hypermetabolic lesions. The remaining hypermetabolic hepatic and bony lesions are smaller and/or demonstrate significantly reduced activity.  Xeloda continued  MRI abdomen 05/16/2017-mixed response to therapy.  1 previously noted liver lesion in segment 3 completely resolved; other previously noted hepatic lesion increased significantly in size.  2 new liver lesions.  Previously noted lesions in the lumbar spine similar.  Cycle 1 weekly Taxol 05/30/2017 2. Left chest subcutaneous mass noted on exam October 04, 2008, status post biopsy by Dr. Brantley Stage with a  benign  pathology. 3. History of delayed healing of the left mastectomy incision. 4. Rheumatoid arthritis. 5. Diabetes. 6. Hypercholesterolemia. 7. Hypertension. 8. Family history of breast cancer. She has been evaluated at the genetic screening clinic. 9. 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 10. 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 appears stable.  She completed cycle 1 week 1 Taxol 05/30/2017.  She had some bone pain and fatigue but otherwise tolerated well.  Plan to proceed with week 2 today as scheduled.  We reviewed the CBC from today.  Counts adequate for treatment.  She was provided with new prescriptions for Valium, OxyContin and Percocet at today's visit.  The skin tear/erythema superior to the Port-A-Cath is consistent with a tape reaction.  She will contact the office with signs of infection.  She will return for cycle 1 week 3 Taxol on 06/14/2017.  We will see her prior to beginning cycle 2 on 06/27/2017.  She will contact the office in the interim with any problems.  Patient seen with Dr. Benay Spice.  [name] ANP/GNP-BC   06/06/2017  10:35 AM This was a shared visit with [name].  [name] tolerated the first treatment with Taxol well.  She will continue weekly Taxol.  [name], MD

## 2017-06-06 NOTE — Patient Instructions (Signed)
[facility] Discharge Instructions for Patients Receiving Chemotherapy  Today you received the following chemotherapy agent: Taxol  To help prevent nausea and vomiting after your treatment, we encourage you to take your nausea medication as prescribed.   If you develop nausea and vomiting that is not controlled by your nausea medication, call the clinic.   BELOW ARE SYMPTOMS THAT SHOULD BE REPORTED IMMEDIATELY:  *FEVER GREATER THAN 100.5 F  *CHILLS WITH OR WITHOUT FEVER  NAUSEA AND VOMITING THAT IS NOT CONTROLLED WITH YOUR NAUSEA MEDICATION  *UNUSUAL SHORTNESS OF BREATH  *UNUSUAL BRUISING OR BLEEDING  TENDERNESS IN MOUTH AND THROAT WITH OR WITHOUT PRESENCE OF ULCERS  *URINARY PROBLEMS  *BOWEL PROBLEMS  UNUSUAL RASH Items with * indicate a potential emergency and should be followed up as soon as possible.  Feel free to call the clinic should you have any questions or concerns. The clinic phone number is [phone].  Please show the CHEMO ALERT CARD at check-in to the Emergency Department and triage nurse.   

## 2017-06-07 ENCOUNTER — Telehealth: Payer: Self-pay | Admitting: Nurse Practitioner

## 2017-06-07 NOTE — Telephone Encounter (Signed)
Scheduled appt per 5/20 los - pt to get an updated schedule next visit.   

## 2017-06-09 MED FILL — OxyCONTIN 20 MG T12A: 20 | 30 days supply | Qty: 60 | Fill #0

## 2017-06-09 MED FILL — OXYCODONE-APAP 10-325: 10-325 | 30 days supply | Qty: 180 | Fill #0

## 2017-06-09 MED FILL — diazePAM 5 MG TABS: 5 | 30 days supply | Qty: 30 | Fill #0

## 2017-06-10 ENCOUNTER — Other Ambulatory Visit: Payer: Self-pay | Admitting: Oncology

## 2017-06-10 DIAGNOSIS — C50912 Malignant neoplasm of unspecified site of left female breast: Secondary | ICD-10-CM

## 2017-06-10 DIAGNOSIS — C7951 Secondary malignant neoplasm of bone: Principal | ICD-10-CM

## 2017-06-13 ENCOUNTER — Other Ambulatory Visit: Payer: Self-pay | Admitting: Oncology

## 2017-06-14 ENCOUNTER — Inpatient Hospital Stay: Payer: Medicare Other

## 2017-06-14 VITALS — BP 142/78 | HR 85 | Temp 98.5°F | Resp 16

## 2017-06-14 DIAGNOSIS — C7951 Secondary malignant neoplasm of bone: Secondary | ICD-10-CM

## 2017-06-14 DIAGNOSIS — C50919 Malignant neoplasm of unspecified site of unspecified female breast: Secondary | ICD-10-CM

## 2017-06-14 DIAGNOSIS — Z5111 Encounter for antineoplastic chemotherapy: Secondary | ICD-10-CM | POA: Diagnosis not present

## 2017-06-14 DIAGNOSIS — Z95828 Presence of other vascular implants and grafts: Secondary | ICD-10-CM

## 2017-06-14 DIAGNOSIS — C50912 Malignant neoplasm of unspecified site of left female breast: Secondary | ICD-10-CM

## 2017-06-14 LAB — CBC WITH DIFFERENTIAL (CANCER CENTER ONLY)
BASOS ABS: 0 10*3/uL (ref 0.0–0.1)
BASOS PCT: 0 %
EOS ABS: 0.1 10*3/uL (ref 0.0–0.5)
Eosinophils Relative: 5 %
HCT: 34.6 % — ABNORMAL LOW (ref 34.8–46.6)
Hemoglobin: 11.2 g/dL — ABNORMAL LOW (ref 11.6–15.9)
Lymphocytes Relative: 24 %
Lymphs Abs: 0.7 10*3/uL — ABNORMAL LOW (ref 0.9–3.3)
MCH: 32.7 pg (ref 25.1–34.0)
MCHC: 32.4 g/dL (ref 31.5–36.0)
MCV: 101.2 fL — ABNORMAL HIGH (ref 79.5–101.0)
Monocytes Absolute: 0.4 10*3/uL (ref 0.1–0.9)
Monocytes Relative: 15 %
Neutro Abs: 1.7 10*3/uL (ref 1.5–6.5)
Neutrophils Relative %: 56 %
PLATELETS: 173 10*3/uL (ref 145–400)
RBC: 3.42 MIL/uL — ABNORMAL LOW (ref 3.70–5.45)
RDW: 14.6 % — AB (ref 11.2–14.5)
WBC Count: 2.9 10*3/uL — ABNORMAL LOW (ref 3.9–10.3)

## 2017-06-14 LAB — CMP (CANCER CENTER ONLY)
ALBUMIN: 3.6 g/dL (ref 3.5–5.0)
ALK PHOS: 120 U/L (ref 40–150)
ALT: 19 U/L (ref 0–55)
ANION GAP: 11 (ref 3–11)
AST: 23 U/L (ref 5–34)
BUN: 19 mg/dL (ref 7–26)
CALCIUM: 8.3 mg/dL — AB (ref 8.4–10.4)
CO2: 24 mmol/L (ref 22–29)
CREATININE: 1.25 mg/dL — AB (ref 0.60–1.10)
Chloride: 104 mmol/L (ref 98–109)
GFR, EST AFRICAN AMERICAN: 51 mL/min — AB (ref >60)
GFR, Estimated: 44 mL/min — ABNORMAL LOW (ref >60)
GLUCOSE: 72 mg/dL (ref 70–140)
Potassium: 3.9 mmol/L (ref 3.5–5.1)
SODIUM: 139 mmol/L (ref 136–145)
TOTAL PROTEIN: 6.9 g/dL (ref 6.4–8.3)

## 2017-06-14 MED ORDER — FAMOTIDINE IN NACL 20-0.9 MG/50ML-% IV SOLN
20.0000 mg | Freq: Once | INTRAVENOUS | Status: AC
Start: 2017-06-14 — End: 2017-06-14
  Administered 2017-06-14: 20 mg via INTRAVENOUS

## 2017-06-14 MED ORDER — DIPHENHYDRAMINE HCL 50 MG/ML IJ SOLN
25.0000 mg | Freq: Once | INTRAMUSCULAR | Status: AC
Start: 2017-06-14 — End: 2017-06-14
  Administered 2017-06-14: 25 mg via INTRAVENOUS

## 2017-06-14 MED ORDER — FAMOTIDINE IN NACL 20-0.9 MG/50ML-% IV SOLN
INTRAVENOUS | Status: AC
  Filled 2017-06-14: qty 50

## 2017-06-14 MED ORDER — SODIUM CHLORIDE 0.9 % IV SOLN
80.0000 mg/m2 | Freq: Once | INTRAVENOUS | Status: AC
  Administered 2017-06-14: 162 mg via INTRAVENOUS
  Filled 2017-06-14: qty 27

## 2017-06-14 MED ORDER — DEXAMETHASONE SODIUM PHOSPHATE 10 MG/ML IJ SOLN
INTRAMUSCULAR | Status: AC
  Filled 2017-06-14: qty 1

## 2017-06-14 MED ORDER — SODIUM CHLORIDE 0.9% FLUSH
10.0000 mL | INTRAVENOUS | Status: DC | PRN
  Administered 2017-06-14: 10 mL
  Filled 2017-06-14: qty 10

## 2017-06-14 MED ORDER — SODIUM CHLORIDE 0.9 % IV SOLN
Freq: Once | INTRAVENOUS | Status: AC
  Administered 2017-06-14: 14:00:00 via INTRAVENOUS

## 2017-06-14 MED ORDER — DIPHENHYDRAMINE HCL 50 MG/ML IJ SOLN
INTRAMUSCULAR | Status: AC
  Filled 2017-06-14: qty 1

## 2017-06-14 MED ORDER — SODIUM CHLORIDE 0.9% FLUSH
10.0000 mL | Freq: Once | INTRAVENOUS | Status: AC
Start: 2017-06-14 — End: 2017-06-14
  Administered 2017-06-14: 10 mL
  Filled 2017-06-14: qty 10

## 2017-06-14 MED ORDER — DEXAMETHASONE SODIUM PHOSPHATE 10 MG/ML IJ SOLN
10.0000 mg | Freq: Once | INTRAMUSCULAR | Status: AC
  Administered 2017-06-14: 10 mg via INTRAVENOUS

## 2017-06-14 MED ORDER — HEPARIN SOD (PORK) LOCK FLUSH 100 UNIT/ML IV SOLN
500.0000 [IU] | Freq: Once | INTRAVENOUS | Status: AC | PRN
  Administered 2017-06-14: 500 [IU]
  Filled 2017-06-14: qty 5

## 2017-06-14 NOTE — Patient Instructions (Signed)
[facility] Discharge Instructions for Patients Receiving Chemotherapy  Today you received the following chemotherapy agents:  Taxol.  To help prevent nausea and vomiting after your treatment, we encourage you to take your nausea medication as directed.   If you develop nausea and vomiting that is not controlled by your nausea medication, call the clinic.   BELOW ARE SYMPTOMS THAT SHOULD BE REPORTED IMMEDIATELY:  *FEVER GREATER THAN 100.5 F  *CHILLS WITH OR WITHOUT FEVER  NAUSEA AND VOMITING THAT IS NOT CONTROLLED WITH YOUR NAUSEA MEDICATION  *UNUSUAL SHORTNESS OF BREATH  *UNUSUAL BRUISING OR BLEEDING  TENDERNESS IN MOUTH AND THROAT WITH OR WITHOUT PRESENCE OF ULCERS  *URINARY PROBLEMS  *BOWEL PROBLEMS  UNUSUAL RASH Items with * indicate a potential emergency and should be followed up as soon as possible.  Feel free to call the clinic should you have any questions or concerns. The clinic phone number is [phone].  Please show the CHEMO ALERT CARD at check-in to the Emergency Department and triage nurse.   

## 2017-06-26 ENCOUNTER — Other Ambulatory Visit: Payer: Self-pay | Admitting: Oncology

## 2017-06-27 ENCOUNTER — Inpatient Hospital Stay: Payer: Medicare Other | Attending: Oncology

## 2017-06-27 ENCOUNTER — Other Ambulatory Visit: Payer: Self-pay | Admitting: Nurse Practitioner

## 2017-06-27 ENCOUNTER — Inpatient Hospital Stay: Payer: Medicare Other

## 2017-06-27 ENCOUNTER — Inpatient Hospital Stay (HOSPITAL_BASED_OUTPATIENT_CLINIC_OR_DEPARTMENT_OTHER): Payer: Medicare Other | Admitting: Oncology

## 2017-06-27 VITALS — BP 157/74 | HR 75 | Temp 98.2°F | Resp 17 | Ht 62.5 in | Wt 204.7 lb

## 2017-06-27 DIAGNOSIS — C7951 Secondary malignant neoplasm of bone: Secondary | ICD-10-CM | POA: Diagnosis not present

## 2017-06-27 DIAGNOSIS — C50912 Malignant neoplasm of unspecified site of left female breast: Secondary | ICD-10-CM

## 2017-06-27 DIAGNOSIS — C787 Secondary malignant neoplasm of liver and intrahepatic bile duct: Secondary | ICD-10-CM | POA: Insufficient documentation

## 2017-06-27 DIAGNOSIS — M069 Rheumatoid arthritis, unspecified: Secondary | ICD-10-CM | POA: Diagnosis not present

## 2017-06-27 DIAGNOSIS — C50919 Malignant neoplasm of unspecified site of unspecified female breast: Secondary | ICD-10-CM

## 2017-06-27 DIAGNOSIS — E119 Type 2 diabetes mellitus without complications: Secondary | ICD-10-CM | POA: Diagnosis not present

## 2017-06-27 DIAGNOSIS — I10 Essential (primary) hypertension: Secondary | ICD-10-CM

## 2017-06-27 DIAGNOSIS — Z95828 Presence of other vascular implants and grafts: Secondary | ICD-10-CM

## 2017-06-27 DIAGNOSIS — Z5111 Encounter for antineoplastic chemotherapy: Secondary | ICD-10-CM | POA: Insufficient documentation

## 2017-06-27 DIAGNOSIS — G893 Neoplasm related pain (acute) (chronic): Secondary | ICD-10-CM | POA: Diagnosis not present

## 2017-06-27 DIAGNOSIS — C50911 Malignant neoplasm of unspecified site of right female breast: Secondary | ICD-10-CM

## 2017-06-27 LAB — CMP (CANCER CENTER ONLY)
ALBUMIN: 3.6 g/dL (ref 3.5–5.0)
ALT: 7 U/L (ref 0–55)
AST: 14 U/L (ref 5–34)
Alkaline Phosphatase: 108 U/L (ref 40–150)
Anion gap: 9 (ref 3–11)
BILIRUBIN TOTAL: 0.2 mg/dL (ref 0.2–1.2)
BUN: 15 mg/dL (ref 7–26)
CHLORIDE: 106 mmol/L (ref 98–109)
CO2: 25 mmol/L (ref 22–29)
Calcium: 9.8 mg/dL (ref 8.4–10.4)
Creatinine: 1.13 mg/dL — ABNORMAL HIGH (ref 0.60–1.10)
GFR, EST NON AFRICAN AMERICAN: 50 mL/min — AB (ref >60)
GFR, Est AFR Am: 57 mL/min — ABNORMAL LOW (ref >60)
GLUCOSE: 80 mg/dL (ref 70–140)
POTASSIUM: 3.9 mmol/L (ref 3.5–5.1)
Sodium: 140 mmol/L (ref 136–145)
TOTAL PROTEIN: 6.7 g/dL (ref 6.4–8.3)

## 2017-06-27 LAB — CBC WITH DIFFERENTIAL (CANCER CENTER ONLY)
BASOS ABS: 0 10*3/uL (ref 0.0–0.1)
Basophils Relative: 0 %
EOS PCT: 3 %
Eosinophils Absolute: 0.1 10*3/uL (ref 0.0–0.5)
HEMATOCRIT: 33.5 % — AB (ref 34.8–46.6)
Hemoglobin: 11 g/dL — ABNORMAL LOW (ref 11.6–15.9)
LYMPHS ABS: 0.6 10*3/uL — AB (ref 0.9–3.3)
LYMPHS PCT: 15 %
MCH: 32.4 pg (ref 25.1–34.0)
MCHC: 32.8 g/dL (ref 31.5–36.0)
MCV: 98.8 fL (ref 79.5–101.0)
Monocytes Absolute: 0.6 10*3/uL (ref 0.1–0.9)
Monocytes Relative: 14 %
NEUTROS ABS: 2.7 10*3/uL (ref 1.5–6.5)
Neutrophils Relative %: 68 %
Platelet Count: 165 10*3/uL (ref 145–400)
RBC: 3.39 MIL/uL — AB (ref 3.70–5.45)
RDW: 14.5 % (ref 11.2–14.5)
WBC: 3.9 10*3/uL (ref 3.9–10.3)

## 2017-06-27 MED ORDER — DEXAMETHASONE SODIUM PHOSPHATE 10 MG/ML IJ SOLN
INTRAMUSCULAR | Status: AC
Start: 2017-06-27 — End: ?
  Filled 2017-06-27: qty 1

## 2017-06-27 MED ORDER — DEXAMETHASONE SODIUM PHOSPHATE 10 MG/ML IJ SOLN
10.0000 mg | Freq: Once | INTRAMUSCULAR | Status: AC
  Administered 2017-06-27: 10 mg via INTRAVENOUS

## 2017-06-27 MED ORDER — FAMOTIDINE IN NACL 20-0.9 MG/50ML-% IV SOLN
20.0000 mg | Freq: Once | INTRAVENOUS | Status: AC
  Administered 2017-06-27: 20 mg via INTRAVENOUS

## 2017-06-27 MED ORDER — SODIUM CHLORIDE 0.9% FLUSH
10.0000 mL | Freq: Once | INTRAVENOUS | Status: AC
  Administered 2017-06-27: 10 mL
  Filled 2017-06-27: qty 10

## 2017-06-27 MED ORDER — DIPHENHYDRAMINE HCL 50 MG/ML IJ SOLN
INTRAMUSCULAR | Status: AC
Start: 2017-06-27 — End: ?
  Filled 2017-06-27: qty 1

## 2017-06-27 MED ORDER — SODIUM CHLORIDE 0.9% FLUSH
10.0000 mL | INTRAVENOUS | Status: DC | PRN
  Administered 2017-06-27: 10 mL
  Filled 2017-06-27: qty 10

## 2017-06-27 MED ORDER — SODIUM CHLORIDE 0.9 % IV SOLN
80.0000 mg/m2 | Freq: Once | INTRAVENOUS | Status: AC
  Administered 2017-06-27: 162 mg via INTRAVENOUS
  Filled 2017-06-27: qty 27

## 2017-06-27 MED ORDER — SODIUM CHLORIDE 0.9 % IV SOLN
Freq: Once | INTRAVENOUS | Status: AC
  Administered 2017-06-27: 11:00:00 via INTRAVENOUS

## 2017-06-27 MED ORDER — DIPHENHYDRAMINE HCL 50 MG/ML IJ SOLN
25.0000 mg | Freq: Once | INTRAMUSCULAR | Status: AC
  Administered 2017-06-27: 25 mg via INTRAVENOUS

## 2017-06-27 MED ORDER — HEPARIN SOD (PORK) LOCK FLUSH 100 UNIT/ML IV SOLN
500.0000 [IU] | Freq: Once | INTRAVENOUS | Status: AC | PRN
  Administered 2017-06-27: 500 [IU]
  Filled 2017-06-27: qty 5

## 2017-06-27 MED ORDER — FAMOTIDINE IN NACL 20-0.9 MG/50ML-% IV SOLN
INTRAVENOUS | Status: AC
  Filled 2017-06-27: qty 50

## 2017-06-27 NOTE — Patient Instructions (Signed)
[place] Discharge Instructions for Patients Receiving Chemotherapy  Today you received the following chemotherapy agent: Taxol.  To help prevent nausea and vomiting after your treatment, we encourage you to take your nausea medication as prescribed.   If you develop nausea and vomiting that is not controlled by your nausea medication, call the clinic.   BELOW ARE SYMPTOMS THAT SHOULD BE REPORTED IMMEDIATELY:  *FEVER GREATER THAN 100.5 F  *CHILLS WITH OR WITHOUT FEVER  NAUSEA AND VOMITING THAT IS NOT CONTROLLED WITH YOUR NAUSEA MEDICATION  *UNUSUAL SHORTNESS OF BREATH  *UNUSUAL BRUISING OR BLEEDING  TENDERNESS IN MOUTH AND THROAT WITH OR WITHOUT PRESENCE OF ULCERS  *URINARY PROBLEMS  *BOWEL PROBLEMS  UNUSUAL RASH Items with * indicate a potential emergency and should be followed up as soon as possible.  Feel free to call the clinic should you have any questions or concerns. The clinic phone number is [phone].  Please show the [place] at check-in to the Emergency Department and triage nurse.  Paclitaxel (Taxol) injection What is this medicine? PACLITAXEL (PAK li TAX el) is a chemotherapy drug. It targets fast dividing cells, like cancer cells, and causes these cells to die. This medicine is used to treat ovarian cancer, breast cancer, and other cancers. This medicine may be used for other purposes; ask your health care provider or pharmacist if you have questions. COMMON BRAND NAME(S): Onxol, Taxol What should I tell my health care provider before I take this medicine? They need to know if you have any of these conditions: -blood disorders -irregular heartbeat -infection (especially a virus infection such as chickenpox, cold sores, or herpes) -liver disease -previous or ongoing radiation therapy -an unusual or allergic reaction to paclitaxel, alcohol, polyoxyethylated castor oil, other chemotherapy agents, other medicines, foods, dyes, or  preservatives -pregnant or trying to get pregnant -breast-feeding How should I use this medicine? This drug is given as an infusion into a vein. It is administered in a hospital or clinic by a specially trained health care professional. Talk to your pediatrician regarding the use of this medicine in children. Special care may be needed. Overdosage: If you think you have taken too much of this medicine contact a poison control center or emergency room at once. NOTE: This medicine is only for you. Do not share this medicine with others. What if I miss a dose? It is important not to miss your dose. Call your doctor or health care professional if you are unable to keep an appointment. What may interact with this medicine? Do not take this medicine with any of the following medications: -disulfiram -metronidazole This medicine may also interact with the following medications: -cyclosporine -diazepam -ketoconazole -medicines to increase blood counts like filgrastim, pegfilgrastim, sargramostim -other chemotherapy drugs like cisplatin, doxorubicin, epirubicin, etoposide, teniposide, vincristine -quinidine -testosterone -vaccines -verapamil Talk to your doctor or health care professional before taking any of these medicines: -acetaminophen -aspirin -ibuprofen -ketoprofen -naproxen This list may not describe all possible interactions. Give your health care provider a list of all the medicines, herbs, non-prescription drugs, or dietary supplements you use. Also tell them if you smoke, drink alcohol, or use illegal drugs. Some items may interact with your medicine. What should I watch for while using this medicine? Your condition will be monitored carefully while you are receiving this medicine. You will need important blood work done while you are taking this medicine. This medicine can cause serious allergic reactions. To reduce your risk you will need to  take other medicine(s) before  treatment with this medicine. If you experience allergic reactions like skin rash, itching or hives, swelling of the face, lips, or tongue, tell your doctor or health care professional right away. In some cases, you may be given additional medicines to help with side effects. Follow all directions for their use. This drug may make you feel generally unwell. This is not uncommon, as chemotherapy can affect healthy cells as well as cancer cells. Report any side effects. Continue your course of treatment even though you feel ill unless your doctor tells you to stop. Call your doctor or health care professional for advice if you get a fever, chills or sore throat, or other symptoms of a cold or flu. Do not treat yourself. This drug decreases your body's ability to fight infections. Try to avoid being around people who are sick. This medicine may increase your risk to bruise or bleed. Call your doctor or health care professional if you notice any unusual bleeding. Be careful brushing and flossing your teeth or using a toothpick because you may get an infection or bleed more easily. If you have any dental work done, tell your dentist you are receiving this medicine. Avoid taking products that contain aspirin, acetaminophen, ibuprofen, naproxen, or ketoprofen unless instructed by your doctor. These medicines may hide a fever. Do not become pregnant while taking this medicine. Women should inform their doctor if they wish to become pregnant or think they might be pregnant. There is a potential for serious side effects to an unborn child. Talk to your health care professional or pharmacist for more information. Do not breast-feed an infant while taking this medicine. Men are advised not to father a child while receiving this medicine. This product may contain alcohol. Ask your pharmacist or healthcare provider if this medicine contains alcohol. Be sure to tell all healthcare providers you are taking this medicine.  Certain medicines, like metronidazole and disulfiram, can cause an unpleasant reaction when taken with alcohol. The reaction includes flushing, headache, nausea, vomiting, sweating, and increased thirst. The reaction can last from 30 minutes to several hours. What side effects may I notice from receiving this medicine? Side effects that you should report to your doctor or health care professional as soon as possible: -allergic reactions like skin rash, itching or hives, swelling of the face, lips, or tongue -low blood counts - This drug may decrease the number of white blood cells, red blood cells and platelets. You may be at increased risk for infections and bleeding. -signs of infection - fever or chills, cough, sore throat, pain or difficulty passing urine -signs of decreased platelets or bleeding - bruising, pinpoint red spots on the skin, black, tarry stools, nosebleeds -signs of decreased red blood cells - unusually weak or tired, fainting spells, lightheadedness -breathing problems -chest pain -high or low blood pressure -mouth sores -nausea and vomiting -pain, swelling, redness or irritation at the injection site -pain, tingling, numbness in the hands or feet -slow or irregular heartbeat -swelling of the ankle, feet, hands Side effects that usually do not require medical attention (report to your doctor or health care professional if they continue or are bothersome): -bone pain -complete hair loss including hair on your head, underarms, pubic hair, eyebrows, and eyelashes -changes in the color of fingernails -diarrhea -loosening of the fingernails -loss of appetite -muscle or joint pain -red flush to skin -sweating This list may not describe all possible side effects. Call your doctor for medical advice about   side effects. You may report side effects to FDA at 1-800-FDA-1088. Where should I keep my medicine? This drug is given in a hospital or clinic and will not be stored at  home. NOTE: This sheet is a summary. It may not cover all possible information. If you have questions about this medicine, talk to your doctor, pharmacist, or health care provider.  2018 Elsevier/Gold Standard (2014-11-05 19:58:00)   

## 2017-06-27 NOTE — Progress Notes (Signed)
[facility] OFFICE PROGRESS NOTE   Diagnosis: Breast cancer  INTERVAL HISTORY:   [name] returns as scheduled.  She completed 3 weeks of Taxol beginning 05/30/2017.  No symptom of an allergic reaction.  She reports improvement in rheumatoid arthritis symptoms.  She continues to have bone pain, especially at the left lower back and hip areas.  She has pain in the left chest with certain movements.  She developed painful alopecia.  She shaved her head.  Objective:  Vital signs in last 24 hours:  Blood pressure (!) 157/74, pulse 75, temperature 98.2 F (36.8 C), temperature source Oral, resp. rate 17, height 5' 2.5" (1.588 m), weight 204 lb 11.2 oz (92.9 kg), SpO2 99 %.    HEENT: No thrush Resp: Clear bilaterally Cardiac: Regular rate and rhythm GI: No hepatomegaly, tender at the costal margin bilaterally Vascular: No leg edema   Portacath/PICC-without erythema  Lab Results:  Lab Results  Component Value Date   WBC 3.9 06/27/2017   HGB 11.0 (L) 06/27/2017   HCT 33.5 (L) 06/27/2017   MCV 98.8 06/27/2017   PLT 165 06/27/2017   NEUTROABS 2.7 06/27/2017    CMP  Lab Results  Component Value Date   NA 140 06/27/2017   K 3.9 06/27/2017   CL 106 06/27/2017   CO2 25 06/27/2017   GLUCOSE 80 06/27/2017   BUN 15 06/27/2017   CREATININE 1.13 (H) 06/27/2017   CALCIUM 9.8 06/27/2017   PROT 6.7 06/27/2017   ALBUMIN 3.6 06/27/2017   AST 14 06/27/2017   ALT 7 06/27/2017   ALKPHOS 108 06/27/2017   BILITOT 0.2 06/27/2017   GFRNONAA 50 (L) 06/27/2017   GFRAA 57 (L) 06/27/2017    No results found for: CEA1   Medications: I have reviewed the patient's current medications.   Assessment/Plan: 1. 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 ER positive, PR positive, HER-2 negative She completed 4 cycles of adjuvant AC chemotherapy  June 15 through September 03, 2008, and then completed the left chest wall radiation. She began Arimidex following an office visit on November 11, 2008.  Bone scan 06/01/2013 suggestive of thoracic spine metastases, thoracic MRI 06/27/2011 consistent with multiple bone metastases involving the thoracolumbar spine   PET scan 07/09/2013 with multiple hypermetabolic bone lesions and hypermetabolic mediastinal nodes.   Left iliac lesion biopsy 07/26/2013. Pathology showed metastatic carcinoma, ER positive, PR positive, HER-2 negative   Initiation of Faslodex 08/10/2013.  Xgeva every 3 months initiated 07/27/2013.  Restaging PET scan 01/23/2014 with no residual hypermetabolic mediastinal disease; marked improvement in the metastatic bone disease.  Restaging PET scan 02/17/2015 with recurrence of skeletal metastasis. New hypermetabolic lesions within the pelvis and ribs. No hypermetabolic lymphadenopathy.  Continuation of Faslodex; initiation of Ibrance 03/05/2015  Cycle 2 Ibrance 04/09/2015-dose reduced to 100 mg daily  Cycle 3 Ibrance 05/08/2015  Cycle 4 Ibrance 06/11/2015  Cycle 5 Ibrance 07/15/2015  PET scan 08/12/2015-increase in size and increased metabolic activity associated with bone metastases, no new lesions  Ibrance/Faslodex discontinued  Tamoxifen started 08/07/2015  PET scan 02/04/2016 with new hypermetabolic liver lesions and numerous new skeletal lesions.  Xeloda, 7 days on/7 days off initiated February 2018  Xeloda dose reduced secondary to hand/foot syndrome beginning on 05/04/2016  Restaging MRI liver 05/18/2016-2 dominant hepatic metastases measuring up to 13 mm favored to be improved.  Xeloda continued  PET scan 11/12/2016-significant  improvement with complete resolution of multiple hepatic hypermetabolic lesions and most of the prior bony hypermetabolic lesions. The remaining hypermetabolic hepatic and bony lesions are smaller and/or demonstrate significantly  reduced activity.  Xeloda continued  MRI abdomen 05/16/2017-mixed response to therapy. 1 previously noted liver lesion in segment 3 completely resolved; other previously noted hepatic lesion increased significantly in size. 2 new liver lesions. Previously noted lesions in the lumbar spine similar.  Cycle 1 weekly Taxol 05/30/2017  Cycle 2 weekly Taxol 06/27/2017 2. Left chest subcutaneous mass noted on exam October 04, 2008, status post biopsy by Dr. Brantley Stage with a benign pathology. 3. History of delayed healing of the left mastectomy incision. 4. Rheumatoid arthritis. 5. Diabetes. 6. Hypercholesterolemia. 7. Hypertension. 8. Family history of breast cancer. She has been evaluated at the genetic screening clinic. 9. 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 10. 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 Hand/foot syndrome secondary to Xeloda-Xeloda dose reduced 05/04/2016  Disposition: [name] has completed 1 cycle of weekly Taxol.  She tolerated the Taxol well.  Her overall status appears stable.  She will begin cycle 2 today.  The plan is to complete 3 cycles of Taxol prior to a restaging MRI of the liver.  She will return for an office visit and chemotherapy in 2 weeks.  15 minutes were spent with the patient today.  The majority of the time was used for counseling and coordination of care.  Betsy Coder, MD  06/27/2017  10:21 AM

## 2017-06-28 ENCOUNTER — Other Ambulatory Visit: Payer: Self-pay | Admitting: Oncology

## 2017-06-28 DIAGNOSIS — Z95828 Presence of other vascular implants and grafts: Secondary | ICD-10-CM

## 2017-06-28 LAB — CANCER ANTIGEN 27.29: CA 27.29: 73.5 U/mL — ABNORMAL HIGH (ref 0.0–38.6)

## 2017-06-28 MED FILL — LIDOCAINE-PRILOCAINE CREAM: 2.5-2.5 | 7 days supply | Qty: 30 | Fill #0

## 2017-07-03 ENCOUNTER — Other Ambulatory Visit: Payer: Self-pay | Admitting: Oncology

## 2017-07-04 ENCOUNTER — Inpatient Hospital Stay: Payer: Medicare Other

## 2017-07-04 VITALS — BP 135/63 | HR 86 | Temp 98.7°F | Resp 16

## 2017-07-04 DIAGNOSIS — C7951 Secondary malignant neoplasm of bone: Principal | ICD-10-CM

## 2017-07-04 DIAGNOSIS — Z5111 Encounter for antineoplastic chemotherapy: Secondary | ICD-10-CM | POA: Diagnosis not present

## 2017-07-04 DIAGNOSIS — C50912 Malignant neoplasm of unspecified site of left female breast: Secondary | ICD-10-CM

## 2017-07-04 DIAGNOSIS — C50919 Malignant neoplasm of unspecified site of unspecified female breast: Secondary | ICD-10-CM

## 2017-07-04 DIAGNOSIS — C50911 Malignant neoplasm of unspecified site of right female breast: Secondary | ICD-10-CM

## 2017-07-04 DIAGNOSIS — Z95828 Presence of other vascular implants and grafts: Secondary | ICD-10-CM

## 2017-07-04 LAB — CBC WITH DIFFERENTIAL (CANCER CENTER ONLY)
Basophils Absolute: 0 10*3/uL (ref 0.0–0.1)
Basophils Relative: 1 %
Eosinophils Absolute: 0.1 10*3/uL (ref 0.0–0.5)
Eosinophils Relative: 4 %
HEMATOCRIT: 32.7 % — AB (ref 34.8–46.6)
HEMOGLOBIN: 10.8 g/dL — AB (ref 11.6–15.9)
LYMPHS ABS: 0.5 10*3/uL — AB (ref 0.9–3.3)
Lymphocytes Relative: 15 %
MCH: 32.3 pg (ref 25.1–34.0)
MCHC: 33.1 g/dL (ref 31.5–36.0)
MCV: 97.6 fL (ref 79.5–101.0)
MONOS PCT: 10 %
Monocytes Absolute: 0.3 10*3/uL (ref 0.1–0.9)
NEUTROS ABS: 2.3 10*3/uL (ref 1.5–6.5)
NEUTROS PCT: 70 %
Platelet Count: 201 10*3/uL (ref 145–400)
RBC: 3.35 MIL/uL — ABNORMAL LOW (ref 3.70–5.45)
RDW: 15.2 % — ABNORMAL HIGH (ref 11.2–14.5)
WBC Count: 3.2 10*3/uL — ABNORMAL LOW (ref 3.9–10.3)

## 2017-07-04 MED ORDER — SODIUM CHLORIDE 0.9% FLUSH
10.0000 mL | Freq: Once | INTRAVENOUS | Status: AC
  Administered 2017-07-04: 10 mL
  Filled 2017-07-04: qty 10

## 2017-07-04 MED ORDER — DEXAMETHASONE SODIUM PHOSPHATE 10 MG/ML IJ SOLN
10.0000 mg | Freq: Once | INTRAMUSCULAR | Status: AC
  Administered 2017-07-04: 10 mg via INTRAVENOUS

## 2017-07-04 MED ORDER — SODIUM CHLORIDE 0.9 % IV SOLN
80.0000 mg/m2 | Freq: Once | INTRAVENOUS | Status: AC
  Administered 2017-07-04: 162 mg via INTRAVENOUS
  Filled 2017-07-04: qty 27

## 2017-07-04 MED ORDER — DIPHENHYDRAMINE HCL 50 MG/ML IJ SOLN
INTRAMUSCULAR | Status: AC
  Filled 2017-07-04: qty 1

## 2017-07-04 MED ORDER — FAMOTIDINE IN NACL 20-0.9 MG/50ML-% IV SOLN
INTRAVENOUS | Status: AC
  Filled 2017-07-04: qty 50

## 2017-07-04 MED ORDER — HEPARIN SOD (PORK) LOCK FLUSH 100 UNIT/ML IV SOLN
500.0000 [IU] | Freq: Once | INTRAVENOUS | Status: AC | PRN
  Administered 2017-07-04: 500 [IU]
  Filled 2017-07-04: qty 5

## 2017-07-04 MED ORDER — DEXAMETHASONE SODIUM PHOSPHATE 10 MG/ML IJ SOLN
INTRAMUSCULAR | Status: AC
  Filled 2017-07-04: qty 1

## 2017-07-04 MED ORDER — DIPHENHYDRAMINE HCL 50 MG/ML IJ SOLN
25.0000 mg | Freq: Once | INTRAMUSCULAR | Status: AC
  Administered 2017-07-04: 25 mg via INTRAVENOUS

## 2017-07-04 MED ORDER — SODIUM CHLORIDE 0.9 % IV SOLN
Freq: Once | INTRAVENOUS | Status: AC
  Administered 2017-07-04: 10:00:00 via INTRAVENOUS

## 2017-07-04 MED ORDER — SODIUM CHLORIDE 0.9% FLUSH
10.0000 mL | INTRAVENOUS | Status: DC | PRN
  Administered 2017-07-04: 10 mL
  Filled 2017-07-04: qty 10

## 2017-07-04 MED ORDER — FAMOTIDINE IN NACL 20-0.9 MG/50ML-% IV SOLN
20.0000 mg | Freq: Once | INTRAVENOUS | Status: AC
  Administered 2017-07-04: 20 mg via INTRAVENOUS

## 2017-07-04 NOTE — Progress Notes (Signed)
Ok to treat with CMET from 06/27/17 per Dr. Benay Spice. Infusion RN made aware

## 2017-07-04 NOTE — Patient Instructions (Signed)
[facility] Discharge Instructions for Patients Receiving Chemotherapy  Today you received the following chemotherapy agent: Taxol.  To help prevent nausea and vomiting after your treatment, we encourage you to take your nausea medication as prescribed.   If you develop nausea and vomiting that is not controlled by your nausea medication, call the clinic.   BELOW ARE SYMPTOMS THAT SHOULD BE REPORTED IMMEDIATELY:  *FEVER GREATER THAN 100.5 F  *CHILLS WITH OR WITHOUT FEVER  NAUSEA AND VOMITING THAT IS NOT CONTROLLED WITH YOUR NAUSEA MEDICATION  *UNUSUAL SHORTNESS OF BREATH  *UNUSUAL BRUISING OR BLEEDING  TENDERNESS IN MOUTH AND THROAT WITH OR WITHOUT PRESENCE OF ULCERS  *URINARY PROBLEMS  *BOWEL PROBLEMS  UNUSUAL RASH Items with * indicate a potential emergency and should be followed up as soon as possible.  Feel free to call the clinic should you have any questions or concerns. The clinic phone number is [phone].  Please show the [place] at check-in to the Emergency Department and triage nurse.  Paclitaxel (Taxol) injection What is this medicine? PACLITAXEL (PAK li TAX el) is a chemotherapy drug. It targets fast dividing cells, like cancer cells, and causes these cells to die. This medicine is used to treat ovarian cancer, breast cancer, and other cancers. This medicine may be used for other purposes; ask your health care provider or pharmacist if you have questions. COMMON BRAND NAME(S): Onxol, Taxol What should I tell my health care provider before I take this medicine? They need to know if you have any of these conditions: -blood disorders -irregular heartbeat -infection (especially a virus infection such as chickenpox, cold sores, or herpes) -liver disease -previous or ongoing radiation therapy -an unusual or allergic reaction to paclitaxel, alcohol, polyoxyethylated castor oil, other chemotherapy agents, other medicines, foods, dyes, or  preservatives -pregnant or trying to get pregnant -breast-feeding How should I use this medicine? This drug is given as an infusion into a vein. It is administered in a hospital or clinic by a specially trained health care professional. Talk to your pediatrician regarding the use of this medicine in children. Special care may be needed. Overdosage: If you think you have taken too much of this medicine contact a poison control center or emergency room at once. NOTE: This medicine is only for you. Do not share this medicine with others. What if I miss a dose? It is important not to miss your dose. Call your doctor or health care professional if you are unable to keep an appointment. What may interact with this medicine? Do not take this medicine with any of the following medications: -disulfiram -metronidazole This medicine may also interact with the following medications: -cyclosporine -diazepam -ketoconazole -medicines to increase blood counts like filgrastim, pegfilgrastim, sargramostim -other chemotherapy drugs like cisplatin, doxorubicin, epirubicin, etoposide, teniposide, vincristine -quinidine -testosterone -vaccines -verapamil Talk to your doctor or health care professional before taking any of these medicines: -acetaminophen -aspirin -ibuprofen -ketoprofen -naproxen This list may not describe all possible interactions. Give your health care provider a list of all the medicines, herbs, non-prescription drugs, or dietary supplements you use. Also tell them if you smoke, drink alcohol, or use illegal drugs. Some items may interact with your medicine. What should I watch for while using this medicine? Your condition will be monitored carefully while you are receiving this medicine. You will need important blood work done while you are taking this medicine. This medicine can cause serious allergic reactions. To reduce your risk you will need to  take other medicine(s) before  treatment with this medicine. If you experience allergic reactions like skin rash, itching or hives, swelling of the face, lips, or tongue, tell your doctor or health care professional right away. In some cases, you may be given additional medicines to help with side effects. Follow all directions for their use. This drug may make you feel generally unwell. This is not uncommon, as chemotherapy can affect healthy cells as well as cancer cells. Report any side effects. Continue your course of treatment even though you feel ill unless your doctor tells you to stop. Call your doctor or health care professional for advice if you get a fever, chills or sore throat, or other symptoms of a cold or flu. Do not treat yourself. This drug decreases your body's ability to fight infections. Try to avoid being around people who are sick. This medicine may increase your risk to bruise or bleed. Call your doctor or health care professional if you notice any unusual bleeding. Be careful brushing and flossing your teeth or using a toothpick because you may get an infection or bleed more easily. If you have any dental work done, tell your dentist you are receiving this medicine. Avoid taking products that contain aspirin, acetaminophen, ibuprofen, naproxen, or ketoprofen unless instructed by your doctor. These medicines may hide a fever. Do not become pregnant while taking this medicine. Women should inform their doctor if they wish to become pregnant or think they might be pregnant. There is a potential for serious side effects to an unborn child. Talk to your health care professional or pharmacist for more information. Do not breast-feed an infant while taking this medicine. Men are advised not to father a child while receiving this medicine. This product may contain alcohol. Ask your pharmacist or healthcare provider if this medicine contains alcohol. Be sure to tell all healthcare providers you are taking this medicine.  Certain medicines, like metronidazole and disulfiram, can cause an unpleasant reaction when taken with alcohol. The reaction includes flushing, headache, nausea, vomiting, sweating, and increased thirst. The reaction can last from 30 minutes to several hours. What side effects may I notice from receiving this medicine? Side effects that you should report to your doctor or health care professional as soon as possible: -allergic reactions like skin rash, itching or hives, swelling of the face, lips, or tongue -low blood counts - This drug may decrease the number of white blood cells, red blood cells and platelets. You may be at increased risk for infections and bleeding. -signs of infection - fever or chills, cough, sore throat, pain or difficulty passing urine -signs of decreased platelets or bleeding - bruising, pinpoint red spots on the skin, black, tarry stools, nosebleeds -signs of decreased red blood cells - unusually weak or tired, fainting spells, lightheadedness -breathing problems -chest pain -high or low blood pressure -mouth sores -nausea and vomiting -pain, swelling, redness or irritation at the injection site -pain, tingling, numbness in the hands or feet -slow or irregular heartbeat -swelling of the ankle, feet, hands Side effects that usually do not require medical attention (report to your doctor or health care professional if they continue or are bothersome): -bone pain -complete hair loss including hair on your head, underarms, pubic hair, eyebrows, and eyelashes -changes in the color of fingernails -diarrhea -loosening of the fingernails -loss of appetite -muscle or joint pain -red flush to skin -sweating This list may not describe all possible side effects. Call your doctor for medical advice about   side effects. You may report side effects to FDA at 1-800-FDA-1088. Where should I keep my medicine? This drug is given in a hospital or clinic and will not be stored at  home. NOTE: This sheet is a summary. It may not cover all possible information. If you have questions about this medicine, talk to your doctor, pharmacist, or health care provider.  2018 [employer]vier/Gold Standard (2014-11-05 19:58:00)   

## 2017-07-10 ENCOUNTER — Other Ambulatory Visit: Payer: Self-pay | Admitting: Oncology

## 2017-07-11 ENCOUNTER — Inpatient Hospital Stay (HOSPITAL_BASED_OUTPATIENT_CLINIC_OR_DEPARTMENT_OTHER): Payer: Medicare Other | Admitting: Oncology

## 2017-07-11 ENCOUNTER — Inpatient Hospital Stay: Payer: Medicare Other

## 2017-07-11 ENCOUNTER — Telehealth: Payer: Self-pay | Admitting: Oncology

## 2017-07-11 DIAGNOSIS — C50919 Malignant neoplasm of unspecified site of unspecified female breast: Secondary | ICD-10-CM | POA: Diagnosis not present

## 2017-07-11 DIAGNOSIS — I10 Essential (primary) hypertension: Secondary | ICD-10-CM | POA: Diagnosis not present

## 2017-07-11 DIAGNOSIS — Z95828 Presence of other vascular implants and grafts: Secondary | ICD-10-CM

## 2017-07-11 DIAGNOSIS — G893 Neoplasm related pain (acute) (chronic): Secondary | ICD-10-CM

## 2017-07-11 DIAGNOSIS — Z5111 Encounter for antineoplastic chemotherapy: Secondary | ICD-10-CM | POA: Diagnosis not present

## 2017-07-11 DIAGNOSIS — C7951 Secondary malignant neoplasm of bone: Secondary | ICD-10-CM

## 2017-07-11 DIAGNOSIS — C50912 Malignant neoplasm of unspecified site of left female breast: Secondary | ICD-10-CM

## 2017-07-11 DIAGNOSIS — M069 Rheumatoid arthritis, unspecified: Secondary | ICD-10-CM

## 2017-07-11 DIAGNOSIS — E119 Type 2 diabetes mellitus without complications: Secondary | ICD-10-CM

## 2017-07-11 DIAGNOSIS — C50911 Malignant neoplasm of unspecified site of right female breast: Secondary | ICD-10-CM

## 2017-07-11 LAB — CBC WITH DIFFERENTIAL (CANCER CENTER ONLY)
Basophils Absolute: 0 10*3/uL (ref 0.0–0.1)
Basophils Relative: 0 %
Eosinophils Absolute: 0.1 10*3/uL (ref 0.0–0.5)
Eosinophils Relative: 2 %
HEMATOCRIT: 33.6 % — AB (ref 34.8–46.6)
HEMOGLOBIN: 10.8 g/dL — AB (ref 11.6–15.9)
LYMPHS PCT: 15 %
Lymphs Abs: 0.5 10*3/uL — ABNORMAL LOW (ref 0.9–3.3)
MCH: 31.8 pg (ref 25.1–34.0)
MCHC: 32.1 g/dL (ref 31.5–36.0)
MCV: 98.8 fL (ref 79.5–101.0)
MONO ABS: 0.3 10*3/uL (ref 0.1–0.9)
Monocytes Relative: 7 %
NEUTROS ABS: 2.9 10*3/uL (ref 1.5–6.5)
NEUTROS PCT: 76 %
Platelet Count: 169 10*3/uL (ref 145–400)
RBC: 3.4 MIL/uL — AB (ref 3.70–5.45)
RDW: 14.5 % (ref 11.2–14.5)
WBC Count: 3.7 10*3/uL — ABNORMAL LOW (ref 3.9–10.3)

## 2017-07-11 LAB — CMP (CANCER CENTER ONLY)
ALBUMIN: 3.6 g/dL (ref 3.5–5.0)
ALK PHOS: 98 U/L (ref 40–150)
ALT: 7 U/L (ref 0–55)
AST: 14 U/L (ref 5–34)
Anion gap: 9 (ref 3–11)
BUN: 16 mg/dL (ref 7–26)
CALCIUM: 8.7 mg/dL (ref 8.4–10.4)
CO2: 26 mmol/L (ref 22–29)
CREATININE: 1 mg/dL (ref 0.60–1.10)
Chloride: 105 mmol/L (ref 98–109)
GFR, Estimated: 57 mL/min — ABNORMAL LOW (ref >60)
GLUCOSE: 92 mg/dL (ref 70–140)
Potassium: 4 mmol/L (ref 3.5–5.1)
Sodium: 140 mmol/L (ref 136–145)
TOTAL PROTEIN: 6.7 g/dL (ref 6.4–8.3)

## 2017-07-11 MED ORDER — DIPHENHYDRAMINE HCL 50 MG/ML IJ SOLN
25.0000 mg | Freq: Once | INTRAMUSCULAR | Status: AC
  Administered 2017-07-11: 25 mg via INTRAVENOUS

## 2017-07-11 MED ORDER — SODIUM CHLORIDE 0.9 % IV SOLN
80.0000 mg/m2 | Freq: Once | INTRAVENOUS | Status: AC
  Administered 2017-07-11: 162 mg via INTRAVENOUS
  Filled 2017-07-11: qty 27

## 2017-07-11 MED ORDER — DEXAMETHASONE SODIUM PHOSPHATE 10 MG/ML IJ SOLN
INTRAMUSCULAR | Status: AC
Start: 2017-07-11 — End: ?
  Filled 2017-07-11: qty 1

## 2017-07-11 MED ORDER — SODIUM CHLORIDE 0.9 % IV SOLN
Freq: Once | INTRAVENOUS | Status: AC
  Administered 2017-07-11: 12:00:00 via INTRAVENOUS

## 2017-07-11 MED ORDER — OXYCODONE HCL ER 20 MG PO T12A: 20.0000 mg | EXTENDED_RELEASE_TABLET | Freq: Two times a day (BID) | ORAL | 0 refills | Status: DC

## 2017-07-11 MED ORDER — DIAZEPAM 5 MG PO TABS: 5.0000 mg | ORAL_TABLET | Freq: Every evening | ORAL | 0 refills | Status: DC | PRN

## 2017-07-11 MED ORDER — OXYCODONE HCL ER 20 MG PO T12A: EXTENDED_RELEASE_TABLET | ORAL | 0 refills | Status: DC

## 2017-07-11 MED ORDER — SODIUM CHLORIDE 0.9% FLUSH
10.0000 mL | INTRAVENOUS | Status: DC | PRN
  Administered 2017-07-11: 10 mL
  Filled 2017-07-11: qty 10

## 2017-07-11 MED ORDER — SODIUM CHLORIDE 0.9% FLUSH
10.0000 mL | Freq: Once | INTRAVENOUS | Status: AC
  Administered 2017-07-11: 10 mL
  Filled 2017-07-11: qty 10

## 2017-07-11 MED ORDER — DIPHENHYDRAMINE HCL 25 MG PO CAPS
ORAL_CAPSULE | ORAL | Status: AC
Start: 2017-07-11 — End: ?
  Filled 2017-07-11: qty 1

## 2017-07-11 MED ORDER — FAMOTIDINE IN NACL 20-0.9 MG/50ML-% IV SOLN
INTRAVENOUS | Status: AC
  Filled 2017-07-11: qty 50

## 2017-07-11 MED ORDER — OXYCODONE-ACETAMINOPHEN 10-325 MG PO TABS: 1.0000 | ORAL_TABLET | ORAL | 0 refills | Status: DC | PRN

## 2017-07-11 MED ORDER — DEXAMETHASONE SODIUM PHOSPHATE 10 MG/ML IJ SOLN
10.0000 mg | Freq: Once | INTRAMUSCULAR | Status: AC
  Administered 2017-07-11: 10 mg via INTRAVENOUS

## 2017-07-11 MED ORDER — FAMOTIDINE IN NACL 20-0.9 MG/50ML-% IV SOLN
20.0000 mg | Freq: Once | INTRAVENOUS | Status: AC
  Administered 2017-07-11: 20 mg via INTRAVENOUS

## 2017-07-11 MED ORDER — DIPHENHYDRAMINE HCL 50 MG/ML IJ SOLN
INTRAMUSCULAR | Status: AC
  Filled 2017-07-11: qty 1

## 2017-07-11 MED ORDER — HEPARIN SOD (PORK) LOCK FLUSH 100 UNIT/ML IV SOLN
500.0000 [IU] | Freq: Once | INTRAVENOUS | Status: AC | PRN
  Administered 2017-07-11: 500 [IU]
  Filled 2017-07-11: qty 5

## 2017-07-11 MED FILL — OXYCODONE-APAP 10-325: 10-325 | 30 days supply | Qty: 180 | Fill #0

## 2017-07-11 MED FILL — diazePAM 5 MG TABS: 5 | 30 days supply | Qty: 30 | Fill #0

## 2017-07-11 MED FILL — OxyCONTIN 20 MG T12A: 20 | 30 days supply | Qty: 90 | Fill #0

## 2017-07-11 NOTE — Telephone Encounter (Signed)
Appointments scheduled AVS/Calendar printed per 6/24 los °

## 2017-07-11 NOTE — Progress Notes (Signed)
[name] OFFICE PROGRESS NOTE   Diagnosis: Breast cancer  INTERVAL HISTORY:   [name] returns for a scheduled visit.  She continues weekly Taxol.  No change in neuropathy symptoms.  She has noted increased pain over the past few weeks.  She feels the pain is related to "cancer "pain as opposed to pain from rheumatoid arthritis.  She takes breakthrough oxycodone more frequently.  Objective:  Vital signs in last 24 hours:  Blood pressure (!) 151/64, pulse 81, temperature 98.1 F (36.7 C), temperature source Oral, resp. rate 16, height 5' 2.5" (1.588 m), weight 201 lb 4.8 oz (91.3 kg), SpO2 100 %.    HEENT: No thrush, small ulcer at the right side of the tongue Resp: Lungs clear bilaterally Cardio: Regular rate and rhythm GI: No hepatosplenomegaly, no mass, nontender Vascular: No leg edema  Portacath/PICC-without erythema  Lab Results:  Lab Results  Component Value Date   WBC 3.7 (L) 07/11/2017   HGB 10.8 (L) 07/11/2017   HCT 33.6 (L) 07/11/2017   MCV 98.8 07/11/2017   PLT 169 07/11/2017   NEUTROABS 2.9 07/11/2017    CMP  Lab Results  Component Value Date   NA 140 07/11/2017   K 4.0 07/11/2017   CL 105 07/11/2017   CO2 26 07/11/2017   GLUCOSE 92 07/11/2017   BUN 16 07/11/2017   CREATININE 1.00 07/11/2017   CALCIUM 8.7 07/11/2017   PROT 6.7 07/11/2017   ALBUMIN 3.6 07/11/2017   AST 14 07/11/2017   ALT 7 07/11/2017   ALKPHOS 98 07/11/2017   BILITOT <0.2 (L) 07/11/2017   GFRNONAA 57 (L) 07/11/2017   GFRAA >60 07/11/2017     Medications: I have reviewed the patient's current medications.   Assessment/Plan:  1. 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 ER positive, PR positive, HER-2 negative She completed 4 cycles of adjuvant AC chemotherapy June 15 through September 03, 2008, and then completed the left  chest wall radiation. She began Arimidex following an office visit on November 11, 2008.  Bone scan 06/01/2013 suggestive of thoracic spine metastases, thoracic MRI 06/27/2011 consistent with multiple bone metastases involving the thoracolumbar spine   PET scan 07/09/2013 with multiple hypermetabolic bone lesions and hypermetabolic mediastinal nodes.   Left iliac lesion biopsy 07/26/2013. Pathology showed metastatic carcinoma, ER positive, PR positive, HER-2 negative   Initiation of Faslodex 08/10/2013.  Xgeva every 3 months initiated 07/27/2013.  Restaging PET scan 01/23/2014 with no residual hypermetabolic mediastinal disease; marked improvement in the metastatic bone disease.  Restaging PET scan 02/17/2015 with recurrence of skeletal metastasis. New hypermetabolic lesions within the pelvis and ribs. No hypermetabolic lymphadenopathy.  Continuation of Faslodex; initiation of Ibrance 03/05/2015  Cycle 2 Ibrance 04/09/2015-dose reduced to 100 mg daily  Cycle 3 Ibrance 05/08/2015  Cycle 4 Ibrance 06/11/2015  Cycle 5 Ibrance 07/15/2015  PET scan 08/12/2015-increase in size and increased metabolic activity associated with bone metastases, no new lesions  Ibrance/Faslodex discontinued  Tamoxifen started 08/07/2015  PET scan 02/04/2016 with new hypermetabolic liver lesions and numerous new skeletal lesions.  Xeloda, 7 days on/7 days off initiated February 2018  Xeloda dose reduced secondary to hand/foot syndrome beginning on 05/04/2016  Restaging MRI liver 05/18/2016-2 dominant hepatic metastases measuring up to 13 mm favored to be improved.  Xeloda continued  PET scan 11/12/2016-significant improvement with complete resolution of multiple hepatic hypermetabolic lesions and most of  the prior bony hypermetabolic lesions. The remaining hypermetabolic hepatic and bony lesions are smaller and/or demonstrate significantly reduced activity.  Xeloda continued  MRI abdomen  05/16/2017-mixed response to therapy. 1 previously noted liver lesion in segment 3 completely resolved; other previously noted hepatic lesion increased significantly in size. 2 new liver lesions. Previously noted lesions in the lumbar spine similar.  Cycle 1 weekly Taxol 05/30/2017  Cycle 2 weekly Taxol 06/27/2017 2. Left chest subcutaneous mass noted on exam October 04, 2008, status post biopsy by Dr. Brantley Stage with a benign pathology. 3. History of delayed healing of the left mastectomy incision. 4. Rheumatoid arthritis. 5. Diabetes. 6. Hypercholesterolemia. 7. Hypertension. 8. Family history of breast cancer. She has been evaluated at the genetic screening clinic. 9. 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 10. 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 is completing a second cycle of weekly Taxol.  She has increased pain secondary to metastatic breast cancer.  We increase the OxyContin dose today.  She will return for an office visit with the plan to begin cycle 3 Taxol in 2 weeks.  We will discontinue Taxol and change to a different systemic therapy regimen if the pain continues to increase.  Betsy Coder, MD  07/11/2017  11:07 AM

## 2017-07-11 NOTE — Patient Instructions (Signed)
[facility] Discharge Instructions for Patients Receiving Chemotherapy  Today you received the following chemotherapy agent: Taxol  To help prevent nausea and vomiting after your treatment, we encourage you to take your nausea medication as prescribed.   If you develop nausea and vomiting that is not controlled by your nausea medication, call the clinic.   BELOW ARE SYMPTOMS THAT SHOULD BE REPORTED IMMEDIATELY:  *FEVER GREATER THAN 100.5 F  *CHILLS WITH OR WITHOUT FEVER  NAUSEA AND VOMITING THAT IS NOT CONTROLLED WITH YOUR NAUSEA MEDICATION  *UNUSUAL SHORTNESS OF BREATH  *UNUSUAL BRUISING OR BLEEDING  TENDERNESS IN MOUTH AND THROAT WITH OR WITHOUT PRESENCE OF ULCERS  *URINARY PROBLEMS  *BOWEL PROBLEMS  UNUSUAL RASH Items with * indicate a potential emergency and should be followed up as soon as possible.  Feel free to call the clinic should you have any questions or concerns. The clinic phone number is [phone].  Please show the CHEMO ALERT CARD at check-in to the Emergency Department and triage nurse.   

## 2017-07-23 ENCOUNTER — Other Ambulatory Visit: Payer: Self-pay | Admitting: Oncology

## 2017-07-25 ENCOUNTER — Inpatient Hospital Stay: Payer: Medicare Other | Admitting: *Deleted

## 2017-07-25 ENCOUNTER — Telehealth: Payer: Self-pay

## 2017-07-25 ENCOUNTER — Encounter: Payer: Self-pay | Admitting: Nurse Practitioner

## 2017-07-25 ENCOUNTER — Inpatient Hospital Stay (HOSPITAL_BASED_OUTPATIENT_CLINIC_OR_DEPARTMENT_OTHER): Payer: Medicare Other | Admitting: Nurse Practitioner

## 2017-07-25 ENCOUNTER — Inpatient Hospital Stay: Payer: Medicare Other | Attending: Oncology

## 2017-07-25 ENCOUNTER — Other Ambulatory Visit: Payer: Self-pay | Admitting: *Deleted

## 2017-07-25 VITALS — BP 131/57 | HR 80 | Temp 97.9°F | Resp 18 | Wt 198.1 lb

## 2017-07-25 DIAGNOSIS — I10 Essential (primary) hypertension: Secondary | ICD-10-CM | POA: Diagnosis not present

## 2017-07-25 DIAGNOSIS — C7951 Secondary malignant neoplasm of bone: Secondary | ICD-10-CM | POA: Diagnosis not present

## 2017-07-25 DIAGNOSIS — C50919 Malignant neoplasm of unspecified site of unspecified female breast: Secondary | ICD-10-CM

## 2017-07-25 DIAGNOSIS — C50912 Malignant neoplasm of unspecified site of left female breast: Secondary | ICD-10-CM

## 2017-07-25 DIAGNOSIS — Z5111 Encounter for antineoplastic chemotherapy: Secondary | ICD-10-CM | POA: Diagnosis not present

## 2017-07-25 DIAGNOSIS — R200 Anesthesia of skin: Secondary | ICD-10-CM | POA: Diagnosis not present

## 2017-07-25 DIAGNOSIS — E119 Type 2 diabetes mellitus without complications: Secondary | ICD-10-CM

## 2017-07-25 DIAGNOSIS — C787 Secondary malignant neoplasm of liver and intrahepatic bile duct: Secondary | ICD-10-CM | POA: Diagnosis present

## 2017-07-25 DIAGNOSIS — M069 Rheumatoid arthritis, unspecified: Secondary | ICD-10-CM

## 2017-07-25 DIAGNOSIS — Z95828 Presence of other vascular implants and grafts: Secondary | ICD-10-CM

## 2017-07-25 LAB — CMP (CANCER CENTER ONLY)
ALBUMIN: 3.8 g/dL (ref 3.5–5.0)
ALK PHOS: 105 U/L (ref 38–126)
ALT: 15 U/L (ref 0–44)
AST: 19 U/L (ref 15–41)
Anion gap: 7 (ref 5–15)
BUN: 16 mg/dL (ref 8–23)
CALCIUM: 9.5 mg/dL (ref 8.9–10.3)
CHLORIDE: 105 mmol/L (ref 98–111)
CO2: 29 mmol/L (ref 22–32)
CREATININE: 1.31 mg/dL — AB (ref 0.44–1.00)
GFR, Est AFR Am: 48 mL/min — ABNORMAL LOW (ref >60)
GFR, Estimated: 41 mL/min — ABNORMAL LOW (ref >60)
GLUCOSE: 79 mg/dL (ref 70–99)
Potassium: 4.1 mmol/L (ref 3.5–5.1)
SODIUM: 141 mmol/L (ref 135–145)
Total Bilirubin: 0.3 mg/dL (ref 0.3–1.2)
Total Protein: 6.9 g/dL (ref 6.5–8.1)

## 2017-07-25 LAB — CBC WITH DIFFERENTIAL (CANCER CENTER ONLY)
BASOS ABS: 0 10*3/uL (ref 0.0–0.1)
BASOS PCT: 0 %
EOS ABS: 0.1 10*3/uL (ref 0.0–0.5)
Eosinophils Relative: 3 %
HCT: 33.5 % — ABNORMAL LOW (ref 34.8–46.6)
Hemoglobin: 11.1 g/dL — ABNORMAL LOW (ref 11.6–15.9)
Lymphocytes Relative: 15 %
Lymphs Abs: 0.7 10*3/uL — ABNORMAL LOW (ref 0.9–3.3)
MCH: 32.3 pg (ref 25.1–34.0)
MCHC: 33 g/dL (ref 31.5–36.0)
MCV: 97.7 fL (ref 79.5–101.0)
Monocytes Absolute: 0.6 10*3/uL (ref 0.1–0.9)
Monocytes Relative: 14 %
Neutro Abs: 3 10*3/uL (ref 1.5–6.5)
Neutrophils Relative %: 68 %
Platelet Count: 196 10*3/uL (ref 145–400)
RBC: 3.43 MIL/uL — AB (ref 3.70–5.45)
RDW: 14.9 % — ABNORMAL HIGH (ref 11.2–14.5)
WBC: 4.4 10*3/uL (ref 3.9–10.3)

## 2017-07-25 MED ORDER — FAMOTIDINE IN NACL 20-0.9 MG/50ML-% IV SOLN
20.0000 mg | Freq: Once | INTRAVENOUS | Status: AC
  Administered 2017-07-25: 20 mg via INTRAVENOUS

## 2017-07-25 MED ORDER — SODIUM CHLORIDE 0.9% FLUSH
10.0000 mL | INTRAVENOUS | Status: DC | PRN
  Administered 2017-07-25: 10 mL
  Filled 2017-07-25: qty 10

## 2017-07-25 MED ORDER — DEXAMETHASONE SODIUM PHOSPHATE 10 MG/ML IJ SOLN
10.0000 mg | Freq: Once | INTRAMUSCULAR | Status: AC
  Administered 2017-07-25: 10 mg via INTRAVENOUS

## 2017-07-25 MED ORDER — DIPHENHYDRAMINE HCL 50 MG/ML IJ SOLN
25.0000 mg | Freq: Once | INTRAMUSCULAR | Status: AC
  Administered 2017-07-25: 25 mg via INTRAVENOUS

## 2017-07-25 MED ORDER — HEPARIN SOD (PORK) LOCK FLUSH 100 UNIT/ML IV SOLN
500.0000 [IU] | Freq: Once | INTRAVENOUS | Status: AC | PRN
  Administered 2017-07-25: 500 [IU]
  Filled 2017-07-25: qty 5

## 2017-07-25 MED ORDER — SODIUM CHLORIDE 0.9 % IV SOLN
80.0000 mg/m2 | Freq: Once | INTRAVENOUS | Status: AC
  Administered 2017-07-25: 162 mg via INTRAVENOUS
  Filled 2017-07-25: qty 27

## 2017-07-25 MED ORDER — DENOSUMAB 120 MG/1.7ML ~~LOC~~ SOLN
120.0000 mg | Freq: Once | SUBCUTANEOUS | Status: AC
  Administered 2017-07-25: 120 mg via SUBCUTANEOUS

## 2017-07-25 MED ORDER — SODIUM CHLORIDE 0.9% FLUSH
10.0000 mL | Freq: Once | INTRAVENOUS | Status: AC
  Administered 2017-07-25: 10 mL
  Filled 2017-07-25: qty 10

## 2017-07-25 MED ORDER — FAMOTIDINE IN NACL 20-0.9 MG/50ML-% IV SOLN
INTRAVENOUS | Status: AC
  Filled 2017-07-25: qty 50

## 2017-07-25 MED ORDER — DENOSUMAB 120 MG/1.7ML ~~LOC~~ SOLN
SUBCUTANEOUS | Status: AC
  Filled 2017-07-25: qty 1.7

## 2017-07-25 MED ORDER — DEXAMETHASONE SODIUM PHOSPHATE 10 MG/ML IJ SOLN
INTRAMUSCULAR | Status: AC
  Filled 2017-07-25: qty 1

## 2017-07-25 MED ORDER — SODIUM CHLORIDE 0.9 % IV SOLN
Freq: Once | INTRAVENOUS | Status: AC
  Administered 2017-07-25: 15:00:00 via INTRAVENOUS

## 2017-07-25 MED ORDER — DIPHENHYDRAMINE HCL 50 MG/ML IJ SOLN
INTRAMUSCULAR | Status: AC
  Filled 2017-07-25: qty 1

## 2017-07-25 NOTE — Progress Notes (Signed)
Per Dr. Benay Spice,  [place] to treat with Crea 1.31 today.

## 2017-07-25 NOTE — Progress Notes (Signed)
[place] OFFICE PROGRESS NOTE   Diagnosis:  Breast cancer  INTERVAL HISTORY:   [name] returns as scheduled. She completed cycle 2 weekly Taxol 07/11/2017.  She has periodic "dry heaves".  No vomiting.  She occasionally has loose stools.  She has a healing ulcer along the right side of her tongue.  She is able to tolerate liquids without difficulty.  She has consistent numbness of the fingertips on her right hand; intermittent numbness first and second right toes.  The numbness does not interfere with activity.  She notes overall improved pain control since the pain medication adjustment at the time of her last office visit.  Objective:  Vital signs in last 24 hours:  Blood pressure (!) 131/57, pulse 80, temperature 97.9 F (36.6 C), temperature source Oral, resp. rate 18, weight 198 lb 1 oz (89.8 kg), SpO2 97 %.    HEENT: Healing ulceration left buccal mucosa.  Healing ulceration right lateral tongue. Resp: Lungs clear bilaterally. Cardio: Regular rate and rhythm. GI: Abdomen soft and nontender.  No hepatomegaly. Vascular: No leg edema. Neuro: Vibratory sense mildly decreased over the fingertips per tuning fork exam.  Port-A-Cath without erythema.  Lab Results:  Lab Results  Component Value Date   WBC 4.4 07/25/2017   HGB 11.1 (L) 07/25/2017   HCT 33.5 (L) 07/25/2017   MCV 97.7 07/25/2017   PLT 196 07/25/2017   NEUTROABS 3.0 07/25/2017    Imaging:  No results found.  Medications: I have reviewed the patient's current medications.  Assessment/Plan: 1. 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 ER positive, PR positive, HER-2 negative She completed 4 cycles of adjuvant AC chemotherapy June 15 through September 03, 2008, and then completed the left chest wall radiation. She began Arimidex following an office visit on  November 11, 2008.  Bone scan 06/01/2013 suggestive of thoracic spine metastases, thoracic MRI 06/27/2011 consistent with multiple bone metastases involving the thoracolumbar spine   PET scan 07/09/2013 with multiple hypermetabolic bone lesions and hypermetabolic mediastinal nodes.   Left iliac lesion biopsy 07/26/2013. Pathology showed metastatic carcinoma, ER positive, PR positive, HER-2 negative   Initiation of Faslodex 08/10/2013.  Xgeva every 3 months initiated 07/27/2013.  Restaging PET scan 01/23/2014 with no residual hypermetabolic mediastinal disease; marked improvement in the metastatic bone disease.  Restaging PET scan 02/17/2015 with recurrence of skeletal metastasis. New hypermetabolic lesions within the pelvis and ribs. No hypermetabolic lymphadenopathy.  Continuation of Faslodex; initiation of Ibrance 03/05/2015  Cycle 2 Ibrance 04/09/2015-dose reduced to 100 mg daily  Cycle 3 Ibrance 05/08/2015  Cycle 4 Ibrance 06/11/2015  Cycle 5 Ibrance 07/15/2015  PET scan 08/12/2015-increase in size and increased metabolic activity associated with bone metastases, no new lesions  Ibrance/Faslodex discontinued  Tamoxifen started 08/07/2015  PET scan 02/04/2016 with new hypermetabolic liver lesions and numerous new skeletal lesions.  Xeloda, 7 days on/7 days off initiated February 2018  Xeloda dose reduced secondary to hand/foot syndrome beginning on 05/04/2016  Restaging MRI liver 05/18/2016-2 dominant hepatic metastases measuring up to 13 mm favored to be improved.  Xeloda continued  PET scan 11/12/2016-significant improvement with complete resolution of multiple hepatic hypermetabolic lesions and most of the prior bony hypermetabolic lesions. The remaining hypermetabolic hepatic and bony lesions are smaller and/or demonstrate significantly reduced activity.  Xeloda continued  MRI abdomen 05/16/2017-mixed response to therapy. 1 previously noted liver lesion in  segment  3 completely resolved; other previously noted hepatic lesion increased significantly in size. 2 new liver lesions. Previously noted lesions in the lumbar spine similar.  Cycle 1 weekly Taxol 05/30/2017  Cycle 2 weekly Taxol 06/27/2017  Cycle 3 weekly Taxol 07/25/2017 2. Left chest subcutaneous mass noted on exam October 04, 2008, status post biopsy by Dr. Brantley Stage with a benign pathology. 3. History of delayed healing of the left mastectomy incision. 4. Rheumatoid arthritis. 5. Diabetes. 6. Hypercholesterolemia. 7. Hypertension. 8. Family history of breast cancer. She has been evaluated at the genetic screening clinic. 9. 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 10. 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 appears unchanged.  She has completed 2 cycles of weekly Taxol.  Plan to proceed with cycle 3 today as scheduled.  She will be referred for a restaging evaluation after completion of cycle 3.  We reviewed the CBC from today.  Counts are adequate for treatment.  She will return for lab and Taxol in 1 week.  We will see her in follow-up in 2 weeks.  She will contact the office in the interim with any problems.  Plan reviewed with Dr. Benay Spice.    [name] ANP/GNP-BC   07/25/2017  2:06 PM

## 2017-07-25 NOTE — Telephone Encounter (Signed)
Printed avs and calender of upcoming appointment. Per 7/8 los 

## 2017-07-25 NOTE — Patient Instructions (Signed)
[facility] Discharge Instructions for Patients Receiving Chemotherapy  Today you received the following chemotherapy agents :  Taxol,  Xgeva.  To help prevent nausea and vomiting after your treatment, we encourage you to take your nausea medication as prescribed.   If you develop nausea and vomiting that is not controlled by your nausea medication, call the clinic.   BELOW ARE SYMPTOMS THAT SHOULD BE REPORTED IMMEDIATELY:  *FEVER GREATER THAN 100.5 F  *CHILLS WITH OR WITHOUT FEVER  NAUSEA AND VOMITING THAT IS NOT CONTROLLED WITH YOUR NAUSEA MEDICATION  *UNUSUAL SHORTNESS OF BREATH  *UNUSUAL BRUISING OR BLEEDING  TENDERNESS IN MOUTH AND THROAT WITH OR WITHOUT PRESENCE OF ULCERS  *URINARY PROBLEMS  *BOWEL PROBLEMS  UNUSUAL RASH Items with * indicate a potential emergency and should be followed up as soon as possible.  Feel free to call the clinic should you have any questions or concerns. The clinic phone number is [phone].  Please show the [place] at check-in to the Emergency Department and triage nurse.

## 2017-07-25 NOTE — Telephone Encounter (Signed)
Spoke with patient concerning appointment verification. Per 7/8 phone msg return

## 2017-07-25 NOTE — Telephone Encounter (Signed)
Had a cancellation and was able to move flush closer to est visit. Per 7/8 los. Reprinted avs and calender and gave to patient in infusion

## 2017-07-25 NOTE — Progress Notes (Signed)
Complete 07/25/17 

## 2017-07-26 ENCOUNTER — Telehealth: Payer: Self-pay | Admitting: Nurse Practitioner

## 2017-07-26 LAB — CANCER ANTIGEN 27.29: CA 27.29: 57.3 U/mL — ABNORMAL HIGH (ref 0.0–38.6)

## 2017-07-26 NOTE — Telephone Encounter (Signed)
TC to Pt. To inform of CA27.29 results per [name], left message to return call.

## 2017-07-26 NOTE — Telephone Encounter (Signed)
-----   Message from Owens Shark, NP sent at 07/26/2017  8:45 AM EDT ----- Please let her know CA27.29 is better. F/U as scheduled.

## 2017-07-31 ENCOUNTER — Other Ambulatory Visit: Payer: Self-pay | Admitting: Oncology

## 2017-08-01 ENCOUNTER — Inpatient Hospital Stay: Payer: Medicare Other

## 2017-08-01 VITALS — BP 130/84 | HR 94 | Temp 98.4°F | Resp 18

## 2017-08-01 DIAGNOSIS — C7951 Secondary malignant neoplasm of bone: Principal | ICD-10-CM

## 2017-08-01 DIAGNOSIS — C50912 Malignant neoplasm of unspecified site of left female breast: Secondary | ICD-10-CM

## 2017-08-01 DIAGNOSIS — Z5111 Encounter for antineoplastic chemotherapy: Secondary | ICD-10-CM | POA: Diagnosis not present

## 2017-08-01 DIAGNOSIS — Z95828 Presence of other vascular implants and grafts: Secondary | ICD-10-CM

## 2017-08-01 DIAGNOSIS — C50919 Malignant neoplasm of unspecified site of unspecified female breast: Secondary | ICD-10-CM

## 2017-08-01 LAB — CBC WITH DIFFERENTIAL (CANCER CENTER ONLY)
Basophils Absolute: 0 10*3/uL (ref 0.0–0.1)
Basophils Relative: 0 %
Eosinophils Absolute: 0.2 10*3/uL (ref 0.0–0.5)
Eosinophils Relative: 4 %
HEMATOCRIT: 34.5 % — AB (ref 34.8–46.6)
HEMOGLOBIN: 11.1 g/dL — AB (ref 11.6–15.9)
LYMPHS ABS: 0.6 10*3/uL — AB (ref 0.9–3.3)
Lymphocytes Relative: 13 %
MCH: 31.4 pg (ref 25.1–34.0)
MCHC: 32.2 g/dL (ref 31.5–36.0)
MCV: 97.7 fL (ref 79.5–101.0)
MONOS PCT: 8 %
Monocytes Absolute: 0.4 10*3/uL (ref 0.1–0.9)
NEUTROS PCT: 75 %
Neutro Abs: 3.5 10*3/uL (ref 1.5–6.5)
Platelet Count: 195 10*3/uL (ref 145–400)
RBC: 3.53 MIL/uL — ABNORMAL LOW (ref 3.70–5.45)
RDW: 14.1 % (ref 11.2–14.5)
WBC Count: 4.8 10*3/uL (ref 3.9–10.3)

## 2017-08-01 MED ORDER — SODIUM CHLORIDE 0.9 % IV SOLN
Freq: Once | INTRAVENOUS | Status: AC
  Administered 2017-08-01: 14:00:00 via INTRAVENOUS

## 2017-08-01 MED ORDER — DIPHENHYDRAMINE HCL 50 MG/ML IJ SOLN
INTRAMUSCULAR | Status: AC
  Filled 2017-08-01: qty 1

## 2017-08-01 MED ORDER — DEXAMETHASONE SODIUM PHOSPHATE 10 MG/ML IJ SOLN
10.0000 mg | Freq: Once | INTRAMUSCULAR | Status: AC
Start: 2017-08-01 — End: 2017-08-01
  Administered 2017-08-01: 10 mg via INTRAVENOUS

## 2017-08-01 MED ORDER — SODIUM CHLORIDE 0.9% FLUSH
10.0000 mL | Freq: Once | INTRAVENOUS | Status: AC
  Administered 2017-08-01: 10 mL
  Filled 2017-08-01: qty 10

## 2017-08-01 MED ORDER — FAMOTIDINE IN NACL 20-0.9 MG/50ML-% IV SOLN
20.0000 mg | Freq: Once | INTRAVENOUS | Status: AC
  Administered 2017-08-01: 20 mg via INTRAVENOUS

## 2017-08-01 MED ORDER — DEXAMETHASONE SODIUM PHOSPHATE 10 MG/ML IJ SOLN
INTRAMUSCULAR | Status: AC
  Filled 2017-08-01: qty 1

## 2017-08-01 MED ORDER — DIPHENHYDRAMINE HCL 50 MG/ML IJ SOLN
25.0000 mg | Freq: Once | INTRAMUSCULAR | Status: AC
  Administered 2017-08-01: 25 mg via INTRAVENOUS

## 2017-08-01 MED ORDER — SODIUM CHLORIDE 0.9% FLUSH
10.0000 mL | INTRAVENOUS | Status: DC | PRN
  Filled 2017-08-01: qty 10

## 2017-08-01 MED ORDER — FAMOTIDINE IN NACL 20-0.9 MG/50ML-% IV SOLN
INTRAVENOUS | Status: AC
  Filled 2017-08-01: qty 50

## 2017-08-01 MED ORDER — HEPARIN SOD (PORK) LOCK FLUSH 100 UNIT/ML IV SOLN
500.0000 [IU] | Freq: Once | INTRAVENOUS | Status: DC | PRN
  Filled 2017-08-01: qty 5

## 2017-08-01 MED ORDER — SODIUM CHLORIDE 0.9 % IV SOLN
80.0000 mg/m2 | Freq: Once | INTRAVENOUS | Status: AC
  Administered 2017-08-01: 162 mg via INTRAVENOUS
  Filled 2017-08-01: qty 27

## 2017-08-01 NOTE — Progress Notes (Signed)
Ok to treat with CMP from 07/08 per Dr. Sherrill   

## 2017-08-01 NOTE — Patient Instructions (Signed)
[facility] Discharge Instructions for Patients Receiving Chemotherapy  Today you received the following chemotherapy agents :  Taxol.  To help prevent nausea and vomiting after your treatment, we encourage you to take your nausea medication as prescribed.   If you develop nausea and vomiting that is not controlled by your nausea medication, call the clinic.   BELOW ARE SYMPTOMS THAT SHOULD BE REPORTED IMMEDIATELY:  *FEVER GREATER THAN 100.5 F  *CHILLS WITH OR WITHOUT FEVER  NAUSEA AND VOMITING THAT IS NOT CONTROLLED WITH YOUR NAUSEA MEDICATION  *UNUSUAL SHORTNESS OF BREATH  *UNUSUAL BRUISING OR BLEEDING  TENDERNESS IN MOUTH AND THROAT WITH OR WITHOUT PRESENCE OF ULCERS  *URINARY PROBLEMS  *BOWEL PROBLEMS  UNUSUAL RASH Items with * indicate a potential emergency and should be followed up as soon as possible.  Feel free to call the clinic should you have any questions or concerns. The clinic phone number is [phone].  Please show the CHEMO ALERT CARD at check-in to the Emergency Department and triage nurse.   

## 2017-08-08 ENCOUNTER — Ambulatory Visit: Payer: Medicare Other

## 2017-08-08 ENCOUNTER — Other Ambulatory Visit: Payer: Medicare Other

## 2017-08-09 ENCOUNTER — Telehealth: Payer: Self-pay | Admitting: Emergency Medicine

## 2017-08-09 ENCOUNTER — Encounter: Payer: Self-pay | Admitting: Nurse Practitioner

## 2017-08-09 ENCOUNTER — Other Ambulatory Visit: Payer: Medicare Other

## 2017-08-09 ENCOUNTER — Inpatient Hospital Stay (HOSPITAL_BASED_OUTPATIENT_CLINIC_OR_DEPARTMENT_OTHER): Payer: Medicare Other | Admitting: Nurse Practitioner

## 2017-08-09 ENCOUNTER — Inpatient Hospital Stay: Payer: Medicare Other

## 2017-08-09 VITALS — BP 127/57 | HR 90 | Temp 98.5°F | Resp 17 | Ht 62.5 in | Wt 196.6 lb

## 2017-08-09 DIAGNOSIS — I10 Essential (primary) hypertension: Secondary | ICD-10-CM

## 2017-08-09 DIAGNOSIS — Z95828 Presence of other vascular implants and grafts: Secondary | ICD-10-CM

## 2017-08-09 DIAGNOSIS — R52 Pain, unspecified: Secondary | ICD-10-CM

## 2017-08-09 DIAGNOSIS — C7951 Secondary malignant neoplasm of bone: Secondary | ICD-10-CM | POA: Diagnosis not present

## 2017-08-09 DIAGNOSIS — C50919 Malignant neoplasm of unspecified site of unspecified female breast: Secondary | ICD-10-CM

## 2017-08-09 DIAGNOSIS — K1379 Other lesions of oral mucosa: Secondary | ICD-10-CM

## 2017-08-09 DIAGNOSIS — E119 Type 2 diabetes mellitus without complications: Secondary | ICD-10-CM | POA: Diagnosis not present

## 2017-08-09 DIAGNOSIS — C50912 Malignant neoplasm of unspecified site of left female breast: Secondary | ICD-10-CM

## 2017-08-09 DIAGNOSIS — M069 Rheumatoid arthritis, unspecified: Secondary | ICD-10-CM

## 2017-08-09 DIAGNOSIS — Z5111 Encounter for antineoplastic chemotherapy: Secondary | ICD-10-CM | POA: Diagnosis not present

## 2017-08-09 LAB — CBC WITH DIFFERENTIAL (CANCER CENTER ONLY)
BASOS ABS: 0 10*3/uL (ref 0.0–0.1)
Basophils Relative: 0 %
EOS PCT: 3 %
Eosinophils Absolute: 0.1 10*3/uL (ref 0.0–0.5)
HCT: 33 % — ABNORMAL LOW (ref 34.8–46.6)
Hemoglobin: 11 g/dL — ABNORMAL LOW (ref 11.6–15.9)
LYMPHS ABS: 0.4 10*3/uL — AB (ref 0.9–3.3)
LYMPHS PCT: 10 %
MCH: 32.2 pg (ref 25.1–34.0)
MCHC: 33.3 g/dL (ref 31.5–36.0)
MCV: 96.5 fL (ref 79.5–101.0)
MONO ABS: 0.4 10*3/uL (ref 0.1–0.9)
Monocytes Relative: 9 %
Neutro Abs: 3.2 10*3/uL (ref 1.5–6.5)
Neutrophils Relative %: 78 %
PLATELETS: 198 10*3/uL (ref 145–400)
RBC: 3.42 MIL/uL — AB (ref 3.70–5.45)
RDW: 15.2 % — AB (ref 11.2–14.5)
WBC: 4.1 10*3/uL (ref 3.9–10.3)

## 2017-08-09 MED ORDER — SODIUM CHLORIDE 0.9 % IV SOLN
Freq: Once | INTRAVENOUS | Status: AC
  Administered 2017-08-09: 14:00:00 via INTRAVENOUS

## 2017-08-09 MED ORDER — SODIUM CHLORIDE 0.9% FLUSH
10.0000 mL | INTRAVENOUS | Status: DC | PRN
  Administered 2017-08-09: 10 mL
  Filled 2017-08-09: qty 10

## 2017-08-09 MED ORDER — SODIUM CHLORIDE 0.9% FLUSH
10.0000 mL | Freq: Once | INTRAVENOUS | Status: AC
  Administered 2017-08-09: 10 mL
  Filled 2017-08-09: qty 10

## 2017-08-09 MED ORDER — OXYCODONE HCL ER 20 MG PO T12A: EXTENDED_RELEASE_TABLET | ORAL | 0 refills | Status: DC

## 2017-08-09 MED ORDER — DEXAMETHASONE SODIUM PHOSPHATE 10 MG/ML IJ SOLN
10.0000 mg | Freq: Once | INTRAMUSCULAR | Status: AC
  Administered 2017-08-09: 10 mg via INTRAVENOUS

## 2017-08-09 MED ORDER — FAMOTIDINE IN NACL 20-0.9 MG/50ML-% IV SOLN
INTRAVENOUS | Status: AC
  Filled 2017-08-09: qty 50

## 2017-08-09 MED ORDER — HEPARIN SOD (PORK) LOCK FLUSH 100 UNIT/ML IV SOLN
250.0000 [IU] | Freq: Once | INTRAVENOUS | Status: DC
  Filled 2017-08-09: qty 5

## 2017-08-09 MED ORDER — HEPARIN SOD (PORK) LOCK FLUSH 100 UNIT/ML IV SOLN
500.0000 [IU] | Freq: Once | INTRAVENOUS | Status: AC | PRN
  Administered 2017-08-09: 500 [IU]
  Filled 2017-08-09: qty 5

## 2017-08-09 MED ORDER — FAMOTIDINE IN NACL 20-0.9 MG/50ML-% IV SOLN
20.0000 mg | Freq: Once | INTRAVENOUS | Status: AC
  Administered 2017-08-09: 20 mg via INTRAVENOUS

## 2017-08-09 MED ORDER — DIPHENHYDRAMINE HCL 50 MG/ML IJ SOLN
INTRAMUSCULAR | Status: AC
  Filled 2017-08-09: qty 1

## 2017-08-09 MED ORDER — DEXAMETHASONE SODIUM PHOSPHATE 10 MG/ML IJ SOLN
INTRAMUSCULAR | Status: AC
  Filled 2017-08-09: qty 1

## 2017-08-09 MED ORDER — DIAZEPAM 5 MG PO TABS: 5.0000 mg | ORAL_TABLET | Freq: Every evening | ORAL | 0 refills | Status: DC | PRN

## 2017-08-09 MED ORDER — OXYCODONE-ACETAMINOPHEN 10-325 MG PO TABS: 1.0000 | ORAL_TABLET | ORAL | 0 refills | Status: DC | PRN

## 2017-08-09 MED ORDER — SODIUM CHLORIDE 0.9 % IV SOLN
80.0000 mg/m2 | Freq: Once | INTRAVENOUS | Status: AC
  Administered 2017-08-09: 162 mg via INTRAVENOUS
  Filled 2017-08-09: qty 27

## 2017-08-09 MED ORDER — DIPHENHYDRAMINE HCL 50 MG/ML IJ SOLN
25.0000 mg | Freq: Once | INTRAMUSCULAR | Status: AC
  Administered 2017-08-09: 25 mg via INTRAVENOUS

## 2017-08-09 MED FILL — OXYCODONE-APAP 10-325: 10-325 | 30 days supply | Qty: 180 | Fill #0

## 2017-08-09 MED FILL — diazePAM 5 MG TABS: 5 | 30 days supply | Qty: 30 | Fill #0

## 2017-08-09 MED FILL — OxyCONTIN 20 MG T12A: 20 | 30 days supply | Qty: 90 | Fill #0

## 2017-08-09 NOTE — Patient Instructions (Signed)
[facility] Discharge Instructions for Patients Receiving Chemotherapy  Today you received the following chemotherapy agents :  Taxol.  To help prevent nausea and vomiting after your treatment, we encourage you to take your nausea medication as prescribed.   If you develop nausea and vomiting that is not controlled by your nausea medication, call the clinic.   BELOW ARE SYMPTOMS THAT SHOULD BE REPORTED IMMEDIATELY:  *FEVER GREATER THAN 100.5 F  *CHILLS WITH OR WITHOUT FEVER  NAUSEA AND VOMITING THAT IS NOT CONTROLLED WITH YOUR NAUSEA MEDICATION  *UNUSUAL SHORTNESS OF BREATH  *UNUSUAL BRUISING OR BLEEDING  TENDERNESS IN MOUTH AND THROAT WITH OR WITHOUT PRESENCE OF ULCERS  *URINARY PROBLEMS  *BOWEL PROBLEMS  UNUSUAL RASH Items with * indicate a potential emergency and should be followed up as soon as possible.  Feel free to call the clinic should you have any questions or concerns. The clinic phone number is [phone].  Please show the CHEMO ALERT CARD at check-in to the Emergency Department and triage nurse.   

## 2017-08-09 NOTE — Telephone Encounter (Signed)
Ok to tx w/ just CBC today per Lattie Haw NP

## 2017-08-09 NOTE — Progress Notes (Signed)
[name] OFFICE PROGRESS NOTE   Diagnosis: Breast cancer  INTERVAL HISTORY:   [name] returns as scheduled.  She completed day 8 of cycle 3 weekly Taxol 08/01/2017.  She had some dry heaves.  No vomiting.  She has a persistent sore on the right side of the tongue.  She had loose stools for 1 day.  She took Imodium with good results.  Stable neuropathy symptoms in the fingertips.  Overall pain is well controlled on the current regimen.  Objective:  Vital signs in last 24 hours:  Blood pressure (!) 127/57, pulse 90, temperature 98.5 F (36.9 C), temperature source Oral, resp. rate 17, height 5' 2.5" (1.588 m), weight 196 lb 9.6 oz (89.2 kg), SpO2 100 %.    HEENT: Persistent ulceration right lateral tongue. Resp: Lungs clear bilaterally. Cardio: Regular rate and rhythm. GI: Abdomen soft and nontender.  No hepatomegaly. Vascular: No leg edema.  Skin: No rash. Port-A-Cath without erythema.   Lab Results:  Lab Results  Component Value Date   WBC 4.1 08/09/2017   HGB 11.0 (L) 08/09/2017   HCT 33.0 (L) 08/09/2017   MCV 96.5 08/09/2017   PLT 198 08/09/2017   NEUTROABS 3.2 08/09/2017    Imaging:  No results found.  Medications: I have reviewed the patient's current medications.  Assessment/Plan: 1. 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 ER positive, PR positive, HER-2 negative She completed 4 cycles of adjuvant AC chemotherapy June 15 through September 03, 2008, and then completed the left chest wall radiation. She began Arimidex following an office visit on November 11, 2008.  Bone scan 06/01/2013 suggestive of thoracic spine metastases, thoracic MRI 06/27/2011 consistent with multiple bone metastases involving the thoracolumbar spine   PET scan 07/09/2013 with multiple hypermetabolic bone lesions and hypermetabolic  mediastinal nodes.   Left iliac lesion biopsy 07/26/2013. Pathology showed metastatic carcinoma, ER positive, PR positive, HER-2 negative   Initiation of Faslodex 08/10/2013.  Xgeva every 3 months initiated 07/27/2013.  Restaging PET scan 01/23/2014 with no residual hypermetabolic mediastinal disease; marked improvement in the metastatic bone disease.  Restaging PET scan 02/17/2015 with recurrence of skeletal metastasis. New hypermetabolic lesions within the pelvis and ribs. No hypermetabolic lymphadenopathy.  Continuation of Faslodex; initiation of Ibrance 03/05/2015  Cycle 2 Ibrance 04/09/2015-dose reduced to 100 mg daily  Cycle 3 Ibrance 05/08/2015  Cycle 4 Ibrance 06/11/2015  Cycle 5 Ibrance 07/15/2015  PET scan 08/12/2015-increase in size and increased metabolic activity associated with bone metastases, no new lesions  Ibrance/Faslodex discontinued  Tamoxifen started 08/07/2015  PET scan 02/04/2016 with new hypermetabolic liver lesions and numerous new skeletal lesions.  Xeloda, 7 days on/7 days off initiated February 2018  Xeloda dose reduced secondary to hand/foot syndrome beginning on 05/04/2016  Restaging MRI liver 05/18/2016-2 dominant hepatic metastases measuring up to 13 mm favored to be improved.  Xeloda continued  PET scan 11/12/2016-significant improvement with complete resolution of multiple hepatic hypermetabolic lesions and most of the prior bony hypermetabolic lesions. The remaining hypermetabolic hepatic and bony lesions are smaller and/or demonstrate significantly reduced activity.  Xeloda continued  MRI abdomen 05/16/2017-mixed response to therapy. 1 previously noted liver lesion in segment 3 completely resolved; other previously noted hepatic lesion increased significantly in size. 2 new liver lesions. Previously noted lesions in the lumbar spine similar.  Cycle 1 weekly Taxol 05/30/2017  Cycle 2 weekly Taxol 06/27/2017  Cycle 3 weekly Taxol  07/25/2017 2. Left chest subcutaneous mass noted on exam October 04, 2008, status post biopsy by Dr. Brantley Stage with a benign pathology. 3. History of delayed healing of the left mastectomy incision. 4. Rheumatoid arthritis. 5. Diabetes. 6. Hypercholesterolemia. 7. Hypertension. 8. Family history of breast cancer. She has been evaluated at the genetic screening clinic. 9. 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 10. 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 Hand/foot syndrome secondary to Xeloda-Xeloda dose reduced 05/04/2016     Disposition: [name] appears unchanged.  Plan to proceed with Taxol today as scheduled to complete cycle 3.   CBC from today reviewed.  Counts adequate for treatment.   We are referring her for a restaging MRI of the abdomen next week.  She will return for a follow-up visit in 2 weeks.  She will contact the office in the interim with any problems.  Plan reviewed with Dr. Benay [name].    [name] ANP/GNP-BC   08/09/2017  12:39 PM

## 2017-08-10 ENCOUNTER — Telehealth: Payer: Self-pay | Admitting: Nurse Practitioner

## 2017-08-10 NOTE — Telephone Encounter (Signed)
Scheduled appt per 7/23 los - gave patient aVS and calender per los.  

## 2017-08-17 ENCOUNTER — Ambulatory Visit
Admission: RE | Admit: 2017-08-17 | Discharge: 2017-08-17 | Disposition: A | Discharge status: Home / Self Care | Payer: Medicare Other | Source: Ambulatory Visit | Attending: Nurse Practitioner | Admitting: Nurse Practitioner

## 2017-08-17 DIAGNOSIS — C50919 Malignant neoplasm of unspecified site of unspecified female breast: Secondary | ICD-10-CM

## 2017-08-17 DIAGNOSIS — C7951 Secondary malignant neoplasm of bone: Principal | ICD-10-CM

## 2017-08-17 MED ORDER — GADOBENATE DIMEGLUMINE 529 MG/ML IV SOLN
14.0000 mL | Freq: Once | INTRAVENOUS | Status: AC | PRN
  Administered 2017-08-17: 14 mL via INTRAVENOUS

## 2017-08-23 ENCOUNTER — Inpatient Hospital Stay: Payer: Medicare Other

## 2017-08-23 ENCOUNTER — Telehealth: Payer: Self-pay

## 2017-08-23 ENCOUNTER — Inpatient Hospital Stay: Payer: Medicare Other | Attending: Oncology | Admitting: Nurse Practitioner

## 2017-08-23 ENCOUNTER — Encounter: Payer: Self-pay | Admitting: Nurse Practitioner

## 2017-08-23 VITALS — BP 150/68 | HR 96 | Temp 98.6°F | Resp 20 | Ht 62.5 in | Wt 198.5 lb

## 2017-08-23 DIAGNOSIS — C7951 Secondary malignant neoplasm of bone: Secondary | ICD-10-CM | POA: Insufficient documentation

## 2017-08-23 DIAGNOSIS — C50912 Malignant neoplasm of unspecified site of left female breast: Secondary | ICD-10-CM

## 2017-08-23 DIAGNOSIS — Z5111 Encounter for antineoplastic chemotherapy: Secondary | ICD-10-CM | POA: Insufficient documentation

## 2017-08-23 DIAGNOSIS — Z95828 Presence of other vascular implants and grafts: Secondary | ICD-10-CM

## 2017-08-23 DIAGNOSIS — C50919 Malignant neoplasm of unspecified site of unspecified female breast: Secondary | ICD-10-CM | POA: Diagnosis present

## 2017-08-23 DIAGNOSIS — C787 Secondary malignant neoplasm of liver and intrahepatic bile duct: Secondary | ICD-10-CM | POA: Diagnosis present

## 2017-08-23 DIAGNOSIS — M069 Rheumatoid arthritis, unspecified: Secondary | ICD-10-CM | POA: Insufficient documentation

## 2017-08-23 DIAGNOSIS — E119 Type 2 diabetes mellitus without complications: Secondary | ICD-10-CM | POA: Insufficient documentation

## 2017-08-23 DIAGNOSIS — I10 Essential (primary) hypertension: Secondary | ICD-10-CM

## 2017-08-23 DIAGNOSIS — R200 Anesthesia of skin: Secondary | ICD-10-CM | POA: Insufficient documentation

## 2017-08-23 LAB — CBC WITH DIFFERENTIAL (CANCER CENTER ONLY)
BASOS PCT: 0 %
Basophils Absolute: 0 10*3/uL (ref 0.0–0.1)
EOS ABS: 0.1 10*3/uL (ref 0.0–0.5)
Eosinophils Relative: 3 %
HCT: 34.2 % — ABNORMAL LOW (ref 34.8–46.6)
HEMOGLOBIN: 11.3 g/dL — AB (ref 11.6–15.9)
Lymphocytes Relative: 10 %
Lymphs Abs: 0.5 10*3/uL — ABNORMAL LOW (ref 0.9–3.3)
MCH: 31.9 pg (ref 25.1–34.0)
MCHC: 33.2 g/dL (ref 31.5–36.0)
MCV: 95.9 fL (ref 79.5–101.0)
Monocytes Absolute: 0.6 10*3/uL (ref 0.1–0.9)
Monocytes Relative: 12 %
NEUTROS PCT: 75 %
Neutro Abs: 3.9 10*3/uL (ref 1.5–6.5)
Platelet Count: 190 10*3/uL (ref 145–400)
RBC: 3.56 MIL/uL — AB (ref 3.70–5.45)
RDW: 15 % — AB (ref 11.2–14.5)
WBC: 5.1 10*3/uL (ref 3.9–10.3)

## 2017-08-23 LAB — CMP (CANCER CENTER ONLY)
ALK PHOS: 138 U/L — AB (ref 38–126)
ALT: 14 U/L (ref 0–44)
AST: 17 U/L (ref 15–41)
Albumin: 3.5 g/dL (ref 3.5–5.0)
Anion gap: 11 (ref 5–15)
BUN: 19 mg/dL (ref 8–23)
CALCIUM: 10.3 mg/dL (ref 8.9–10.3)
CO2: 24 mmol/L (ref 22–32)
CREATININE: 1.2 mg/dL — AB (ref 0.44–1.00)
Chloride: 103 mmol/L (ref 98–111)
GFR, EST AFRICAN AMERICAN: 53 mL/min — AB (ref >60)
GFR, EST NON AFRICAN AMERICAN: 46 mL/min — AB (ref >60)
Glucose, Bld: 79 mg/dL (ref 70–99)
Potassium: 4 mmol/L (ref 3.5–5.1)
Sodium: 138 mmol/L (ref 135–145)
TOTAL PROTEIN: 6.8 g/dL (ref 6.5–8.1)
Total Bilirubin: 0.3 mg/dL (ref 0.3–1.2)

## 2017-08-23 MED ORDER — SODIUM CHLORIDE 0.9% FLUSH
10.0000 mL | INTRAVENOUS | Status: DC | PRN
  Administered 2017-08-23: 10 mL
  Filled 2017-08-23: qty 10

## 2017-08-23 MED ORDER — SODIUM CHLORIDE 0.9 % IV SOLN
80.0000 mg/m2 | Freq: Once | INTRAVENOUS | Status: AC
  Administered 2017-08-23: 162 mg via INTRAVENOUS
  Filled 2017-08-23: qty 27

## 2017-08-23 MED ORDER — DIPHENHYDRAMINE HCL 50 MG/ML IJ SOLN
25.0000 mg | Freq: Once | INTRAMUSCULAR | Status: AC
  Administered 2017-08-23: 25 mg via INTRAVENOUS

## 2017-08-23 MED ORDER — SODIUM CHLORIDE 0.9 % IV SOLN
Freq: Once | INTRAVENOUS | Status: AC
  Administered 2017-08-23: 15:00:00 via INTRAVENOUS
  Filled 2017-08-23: qty 250

## 2017-08-23 MED ORDER — HEPARIN SOD (PORK) LOCK FLUSH 100 UNIT/ML IV SOLN
500.0000 [IU] | Freq: Once | INTRAVENOUS | Status: AC | PRN
  Administered 2017-08-23: 500 [IU]
  Filled 2017-08-23: qty 5

## 2017-08-23 MED ORDER — FAMOTIDINE IN NACL 20-0.9 MG/50ML-% IV SOLN
INTRAVENOUS | Status: AC
  Filled 2017-08-23: qty 50

## 2017-08-23 MED ORDER — DEXAMETHASONE SODIUM PHOSPHATE 10 MG/ML IJ SOLN
INTRAMUSCULAR | Status: AC
  Filled 2017-08-23: qty 1

## 2017-08-23 MED ORDER — DEXAMETHASONE SODIUM PHOSPHATE 10 MG/ML IJ SOLN
10.0000 mg | Freq: Once | INTRAMUSCULAR | Status: AC
  Administered 2017-08-23: 10 mg via INTRAVENOUS

## 2017-08-23 MED ORDER — SODIUM CHLORIDE 0.9 % IV SOLN
INTRAVENOUS | Status: DC
  Filled 2017-08-23: qty 250

## 2017-08-23 MED ORDER — DIPHENHYDRAMINE HCL 50 MG/ML IJ SOLN
INTRAMUSCULAR | Status: AC
  Filled 2017-08-23: qty 1

## 2017-08-23 MED ORDER — SODIUM CHLORIDE 0.9% FLUSH
10.0000 mL | INTRAVENOUS | Status: DC | PRN
  Administered 2017-08-23: 10 mL via INTRAVENOUS
  Filled 2017-08-23: qty 10

## 2017-08-23 MED ORDER — FAMOTIDINE IN NACL 20-0.9 MG/50ML-% IV SOLN
20.0000 mg | Freq: Once | INTRAVENOUS | Status: AC
  Administered 2017-08-23: 20 mg via INTRAVENOUS

## 2017-08-23 NOTE — Progress Notes (Addendum)
[facility] OFFICE PROGRESS NOTE   Diagnosis: Breast cancer  INTERVAL HISTORY:   [name] returns as scheduled.  She completed cycle 3 weekly Taxol 08/09/2017.  She tends to have "dry heaves" for about 3 days after the chemotherapy.  No vomiting.  Mouth sores better.  No diarrhea.  She has stable numbness in several fingertips on the right hand and at the right great toe.  Pain is unchanged.  Objective:  Vital signs in last 24 hours:  Blood pressure (!) 150/68, pulse 96, temperature 98.6 F (37 C), temperature source Oral, resp. rate 20, height 5' 2.5" (1.588 m), weight 198 lb 8 oz (90 kg), SpO2 97 %.   Resp: Lungs clear bilaterally. Cardio: Regular rate and rhythm. GI: Abdomen soft and nontender.  No hepatomegaly. Vascular: No leg edema. Breast: Status post left mastectomy.   Skin: No rash. Port-A-Cath without erythema.   Lab Results:  Lab Results  Component Value Date   WBC 5.1 08/23/2017   HGB 11.3 (L) 08/23/2017   HCT 34.2 (L) 08/23/2017   MCV 95.9 08/23/2017   PLT 190 08/23/2017   NEUTROABS 3.9 08/23/2017    Imaging:  No results found.  Medications: I have reviewed the patient's current medications.  Assessment/Plan: 1. 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 ER positive, PR positive, HER-2 negative She completed 4 cycles of adjuvant AC chemotherapy June 15 through September 03, 2008, and then completed the left chest wall radiation. She began Arimidex following an office visit on November 11, 2008.  Bone scan 06/01/2013 suggestive of thoracic spine metastases, thoracic MRI 06/27/2011 consistent with multiple bone metastases involving the thoracolumbar spine   PET scan 07/09/2013 with multiple hypermetabolic bone lesions and hypermetabolic mediastinal nodes.   Left iliac lesion biopsy 07/26/2013. Pathology  showed metastatic carcinoma, ER positive, PR positive, HER-2 negative   Initiation of Faslodex 08/10/2013.  Xgeva every 3 months initiated 07/27/2013.  Restaging PET scan 01/23/2014 with no residual hypermetabolic mediastinal disease; marked improvement in the metastatic bone disease.  Restaging PET scan 02/17/2015 with recurrence of skeletal metastasis. New hypermetabolic lesions within the pelvis and ribs. No hypermetabolic lymphadenopathy.  Continuation of Faslodex; initiation of Ibrance 03/05/2015  Cycle 2 Ibrance 04/09/2015-dose reduced to 100 mg daily  Cycle 3 Ibrance 05/08/2015  Cycle 4 Ibrance 06/11/2015  Cycle 5 Ibrance 07/15/2015  PET scan 08/12/2015-increase in size and increased metabolic activity associated with bone metastases, no new lesions  Ibrance/Faslodex discontinued  Tamoxifen started 08/07/2015  PET scan 02/04/2016 with new hypermetabolic liver lesions and numerous new skeletal lesions.  Xeloda, 7 days on/7 days off initiated February 2018  Xeloda dose reduced secondary to hand/foot syndrome beginning on 05/04/2016  Restaging MRI liver 05/18/2016-2 dominant hepatic metastases measuring up to 13 mm favored to be improved.  Xeloda continued  PET scan 11/12/2016-significant improvement with complete resolution of multiple hepatic hypermetabolic lesions and most of the prior bony hypermetabolic lesions. The remaining hypermetabolic hepatic and bony lesions are smaller and/or demonstrate significantly reduced activity.  Xeloda continued  MRI abdomen 05/16/2017-mixed response to therapy. 1 previously noted liver lesion in segment 3 completely resolved; other previously noted hepatic lesion increased significantly in size. 2 new liver lesions. Previously noted lesions in the lumbar spine similar.  Cycle 1 weekly Taxol 05/30/2017  Cycle 2 weekly Taxol 06/27/2017  Cycle 3 weekly Taxol 07/25/2017  MRI abdomen 731 2019-3 previously noted  hepatic lesions  appear stable to slightly decreased in size; one new hepatic lesion measuring 1.2 x 0.9 cm.  Widespread metastatic disease to the bones redemonstrated.  Taxol continued every 2 weeks 2. Left chest subcutaneous mass noted on exam October 04, 2008, status post biopsy by Dr. Brantley Stage with a benign pathology. 3. History of delayed healing of the left mastectomy incision. 4. Rheumatoid arthritis. 5. Diabetes. 6. Hypercholesterolemia. 7. Hypertension. 8. Family history of breast cancer. She has been evaluated at the genetic screening clinic. 9. 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 10. 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 appears stable.  She has completed 3 cycles of weekly Taxol.  The recent restaging abdominal MRI shows overall stable to slightly improved disease involving the liver with question of a new small liver lesion.  Decision made to continue treatment with Taxol, schedule adjusted to every 2 weeks.  Plan to proceed with treatment today as scheduled.  We have reviewed the CBC from today.  Counts adequate for treatment.  She will return for lab and Taxol in 2 weeks.  We will see her in follow-up prior to treatment in 4 weeks.  She will contact the office in the interim with any problems.  Patient seen with Dr. Benay Spice.  MRI images reviewed on the computer with [name].  25 minutes were spent face-to-face at today's visit with the majority of that time involved in counseling/coordination of care.    [name] ANP/GNP-BC   08/23/2017  12:44 PM  This was a shared visit with [name].  We reviewed the MRI images with [name].  We discussed treatment options.  The overall pattern is consistent with stable disease.  She will continue Taxol.  She will be switched to an every 2-week Taxol schedule beginning with treatment today.  [name], MD

## 2017-08-23 NOTE — Telephone Encounter (Signed)
Printed avs and calender of upcoming appointment. Per 8/6 los 

## 2017-08-23 NOTE — Patient Instructions (Signed)
[facility] Discharge Instructions for Patients Receiving Chemotherapy  Today you received the following chemotherapy agents :  Taxol.  To help prevent nausea and vomiting after your treatment, we encourage you to take your nausea medication as prescribed.   If you develop nausea and vomiting that is not controlled by your nausea medication, call the clinic.   BELOW ARE SYMPTOMS THAT SHOULD BE REPORTED IMMEDIATELY:  *FEVER GREATER THAN 100.5 F  *CHILLS WITH OR WITHOUT FEVER  NAUSEA AND VOMITING THAT IS NOT CONTROLLED WITH YOUR NAUSEA MEDICATION  *UNUSUAL SHORTNESS OF BREATH  *UNUSUAL BRUISING OR BLEEDING  TENDERNESS IN MOUTH AND THROAT WITH OR WITHOUT PRESENCE OF ULCERS  *URINARY PROBLEMS  *BOWEL PROBLEMS  UNUSUAL RASH Items with * indicate a potential emergency and should be followed up as soon as possible.  Feel free to call the clinic should you have any questions or concerns. The clinic phone number is [phone].  Please show the CHEMO ALERT CARD at check-in to the Emergency Department and triage nurse.   

## 2017-08-24 LAB — CANCER ANTIGEN 27.29: CA 27.29: 60.6 U/mL — ABNORMAL HIGH (ref 0.0–38.6)

## 2017-08-30 ENCOUNTER — Other Ambulatory Visit: Payer: Medicare Other

## 2017-08-30 ENCOUNTER — Ambulatory Visit: Payer: Medicare Other

## 2017-09-06 ENCOUNTER — Encounter: Payer: Self-pay | Admitting: General Practice

## 2017-09-06 ENCOUNTER — Inpatient Hospital Stay: Payer: Medicare Other

## 2017-09-06 ENCOUNTER — Ambulatory Visit: Payer: Medicare Other | Admitting: Oncology

## 2017-09-06 VITALS — BP 152/81 | HR 91 | Temp 98.7°F | Resp 18 | Ht 62.5 in | Wt 196.2 lb

## 2017-09-06 DIAGNOSIS — C50912 Malignant neoplasm of unspecified site of left female breast: Secondary | ICD-10-CM

## 2017-09-06 DIAGNOSIS — C7951 Secondary malignant neoplasm of bone: Principal | ICD-10-CM

## 2017-09-06 DIAGNOSIS — Z5111 Encounter for antineoplastic chemotherapy: Secondary | ICD-10-CM | POA: Diagnosis not present

## 2017-09-06 DIAGNOSIS — C50919 Malignant neoplasm of unspecified site of unspecified female breast: Secondary | ICD-10-CM

## 2017-09-06 LAB — CMP (CANCER CENTER ONLY)
ALK PHOS: 122 U/L (ref 38–126)
ALT: 8 U/L (ref 0–44)
AST: 15 U/L (ref 15–41)
Albumin: 3.3 g/dL — ABNORMAL LOW (ref 3.5–5.0)
Anion gap: 10 (ref 5–15)
BILIRUBIN TOTAL: 0.3 mg/dL (ref 0.3–1.2)
BUN: 14 mg/dL (ref 8–23)
CALCIUM: 7.6 mg/dL — AB (ref 8.9–10.3)
CO2: 23 mmol/L (ref 22–32)
Chloride: 109 mmol/L (ref 98–111)
Creatinine: 0.93 mg/dL (ref 0.44–1.00)
GFR, Estimated: >60 mL/min (ref >60)
Glucose, Bld: 100 mg/dL — ABNORMAL HIGH (ref 70–99)
Potassium: 3.4 mmol/L — ABNORMAL LOW (ref 3.5–5.1)
SODIUM: 142 mmol/L (ref 135–145)
TOTAL PROTEIN: 6.3 g/dL — AB (ref 6.5–8.1)

## 2017-09-06 LAB — CBC WITH DIFFERENTIAL (CANCER CENTER ONLY)
Basophils Absolute: 0 10*3/uL (ref 0.0–0.1)
Basophils Relative: 0 %
EOS ABS: 0.2 10*3/uL (ref 0.0–0.5)
Eosinophils Relative: 3 %
HEMATOCRIT: 35.1 % (ref 34.8–46.6)
HEMOGLOBIN: 11.1 g/dL — AB (ref 11.6–15.9)
LYMPHS ABS: 0.6 10*3/uL — AB (ref 0.9–3.3)
LYMPHS PCT: 9 %
MCH: 30.7 pg (ref 25.1–34.0)
MCHC: 31.6 g/dL (ref 31.5–36.0)
MCV: 97.2 fL (ref 79.5–101.0)
MONOS PCT: 9 %
Monocytes Absolute: 0.6 10*3/uL (ref 0.1–0.9)
NEUTROS ABS: 4.7 10*3/uL (ref 1.5–6.5)
NEUTROS PCT: 79 %
Platelet Count: 180 10*3/uL (ref 145–400)
RBC: 3.61 MIL/uL — AB (ref 3.70–5.45)
RDW: 14.3 % (ref 11.2–14.5)
WBC: 6 10*3/uL (ref 3.9–10.3)

## 2017-09-06 MED ORDER — SODIUM CHLORIDE 0.9 % IV SOLN
80.0000 mg/m2 | Freq: Once | INTRAVENOUS | Status: AC
  Administered 2017-09-06: 162 mg via INTRAVENOUS
  Filled 2017-09-06: qty 27

## 2017-09-06 MED ORDER — OXYCODONE-ACETAMINOPHEN 10-325 MG PO TABS: 1.0000 | ORAL_TABLET | ORAL | 0 refills | Status: DC | PRN

## 2017-09-06 MED ORDER — DIPHENHYDRAMINE HCL 50 MG/ML IJ SOLN
INTRAMUSCULAR | Status: AC
  Filled 2017-09-06: qty 1

## 2017-09-06 MED ORDER — FAMOTIDINE IN NACL 20-0.9 MG/50ML-% IV SOLN
20.0000 mg | Freq: Once | INTRAVENOUS | Status: AC
  Administered 2017-09-06: 20 mg via INTRAVENOUS

## 2017-09-06 MED ORDER — DEXAMETHASONE SODIUM PHOSPHATE 10 MG/ML IJ SOLN
INTRAMUSCULAR | Status: AC
  Filled 2017-09-06: qty 1

## 2017-09-06 MED ORDER — FAMOTIDINE IN NACL 20-0.9 MG/50ML-% IV SOLN
INTRAVENOUS | Status: AC
  Filled 2017-09-06: qty 50

## 2017-09-06 MED ORDER — DIPHENHYDRAMINE HCL 50 MG/ML IJ SOLN
25.0000 mg | Freq: Once | INTRAMUSCULAR | Status: AC
  Administered 2017-09-06: 25 mg via INTRAVENOUS

## 2017-09-06 MED ORDER — OXYCODONE HCL ER 20 MG PO T12A: EXTENDED_RELEASE_TABLET | ORAL | 0 refills | Status: DC

## 2017-09-06 MED ORDER — HEPARIN SOD (PORK) LOCK FLUSH 100 UNIT/ML IV SOLN
500.0000 [IU] | Freq: Once | INTRAVENOUS | Status: AC | PRN
  Administered 2017-09-06: 500 [IU]
  Filled 2017-09-06: qty 5

## 2017-09-06 MED ORDER — SODIUM CHLORIDE 0.9 % IV SOLN
Freq: Once | INTRAVENOUS | Status: AC
  Administered 2017-09-06: 10:00:00 via INTRAVENOUS
  Filled 2017-09-06: qty 250

## 2017-09-06 MED ORDER — DEXAMETHASONE SODIUM PHOSPHATE 10 MG/ML IJ SOLN
10.0000 mg | Freq: Once | INTRAMUSCULAR | Status: AC
  Administered 2017-09-06: 10 mg via INTRAVENOUS

## 2017-09-06 MED ORDER — DIAZEPAM 5 MG PO TABS: 5.0000 mg | ORAL_TABLET | Freq: Every evening | ORAL | 0 refills | Status: DC | PRN

## 2017-09-06 MED ORDER — SODIUM CHLORIDE 0.9% FLUSH
10.0000 mL | INTRAVENOUS | Status: DC | PRN
  Administered 2017-09-06: 10 mL
  Filled 2017-09-06: qty 10

## 2017-09-06 NOTE — Patient Instructions (Signed)
[facility] Discharge Instructions for Patients Receiving Chemotherapy  Today you received the following chemotherapy agents: Paclitaxel (Taxol)  To help prevent nausea and vomiting after your treatment, we encourage you to take your nausea medication as prescribed. If you develop nausea and vomiting that is not controlled by your nausea medication, call the clinic.   BELOW ARE SYMPTOMS THAT SHOULD BE REPORTED IMMEDIATELY:  *FEVER GREATER THAN 100.5 F  *CHILLS WITH OR WITHOUT FEVER  NAUSEA AND VOMITING THAT IS NOT CONTROLLED WITH YOUR NAUSEA MEDICATION  *UNUSUAL SHORTNESS OF BREATH  *UNUSUAL BRUISING OR BLEEDING  TENDERNESS IN MOUTH AND THROAT WITH OR WITHOUT PRESENCE OF ULCERS  *URINARY PROBLEMS  *BOWEL PROBLEMS  UNUSUAL RASH Items with * indicate a potential emergency and should be followed up as soon as possible.  Feel free to call the clinic should you have any questions or concerns. The clinic phone number is [phone].  Please show the CHEMO ALERT CARD at check-in to the Emergency Department and triage nurse.   

## 2017-09-06 NOTE — Progress Notes (Signed)
[facility] Spiritual Care Note  Received referrals from K Sawick/RN and K Goolsby/NT for bereavement support for [name], who found her husband deceased in his sleep on May 09, 2022. [name] welcomed Spiritual Care, verbalizing deep appreciation for visit and prayer shawl (tangible sign of comfort and care). Provided pastoral presence, reflective listening, normalization of feelings, grief education, emotional support, and referral resources (including 1:1 counseling and support groups via [facility]). We plan for me to phone next week to check in.   [place], [place], [place] [facility] Pager [phone] Voicemail [phone]

## 2017-09-07 ENCOUNTER — Other Ambulatory Visit: Payer: Self-pay | Admitting: Oncology

## 2017-09-07 DIAGNOSIS — Z95828 Presence of other vascular implants and grafts: Secondary | ICD-10-CM

## 2017-09-07 MED FILL — PROCHLORPERAZINE 10 MG TAB: 10 | 15 days supply | Qty: 60 | Fill #1

## 2017-09-07 MED FILL — LIDOCAINE-PRILOCAINE CREAM: 2.5-2.5 | 7 days supply | Qty: 30 | Fill #0

## 2017-09-09 MED FILL — OXYCODONE-APAP 10-325: 10-325 | 30 days supply | Qty: 180 | Fill #0

## 2017-09-09 MED FILL — diazePAM 5 MG TABS: 5 | 30 days supply | Qty: 30 | Fill #0

## 2017-09-09 MED FILL — OxyCONTIN 20 MG T12A: 20 | 30 days supply | Qty: 90 | Fill #0

## 2017-09-12 ENCOUNTER — Encounter: Payer: Self-pay | Admitting: General Practice

## 2017-09-12 NOTE — Progress Notes (Signed)
[facility] Spiritual Care Note  Reached [name] by phone to provide f/u bereavement support. She verbalized such appreciation for the prayer shawl and is working hard to power through logistical tasks since her husband's death. Per pt, she is more comfortable trying to resolve the details than leaving them to wait; taking action helps reduce her distress. She is also taking care of herself by managing post-chemo diarrhea, resting when she can, and setting boundaries for herself (such as leaving the sympathy cards she has received until she feels emotionally ready to read them). Overall, she seems to be coping as well as possible. Provided empathic listening, pastoral reflection, and normalization of feelings--including encouragement to pace herself and give herself space as needed in the midst of tending to logistics. She knows to contact chaplain anytime, and we also plan to f/u in infusion at her appt ca 9/17.   [place], [place], [facility] Pager [phone] Voicemail [phone]

## 2017-09-19 ENCOUNTER — Other Ambulatory Visit: Payer: Self-pay | Admitting: Oncology

## 2017-09-20 ENCOUNTER — Inpatient Hospital Stay (HOSPITAL_BASED_OUTPATIENT_CLINIC_OR_DEPARTMENT_OTHER): Payer: Medicare Other | Admitting: Nurse Practitioner

## 2017-09-20 ENCOUNTER — Inpatient Hospital Stay: Payer: Medicare Other

## 2017-09-20 ENCOUNTER — Inpatient Hospital Stay: Payer: Medicare Other | Attending: Oncology

## 2017-09-20 ENCOUNTER — Encounter: Payer: Self-pay | Admitting: Nurse Practitioner

## 2017-09-20 VITALS — BP 137/61 | HR 91 | Temp 98.6°F | Resp 18 | Ht 62.5 in | Wt 192.3 lb

## 2017-09-20 DIAGNOSIS — Z9012 Acquired absence of left breast and nipple: Secondary | ICD-10-CM | POA: Diagnosis not present

## 2017-09-20 DIAGNOSIS — G893 Neoplasm related pain (acute) (chronic): Secondary | ICD-10-CM | POA: Diagnosis not present

## 2017-09-20 DIAGNOSIS — C787 Secondary malignant neoplasm of liver and intrahepatic bile duct: Secondary | ICD-10-CM | POA: Diagnosis present

## 2017-09-20 DIAGNOSIS — C50912 Malignant neoplasm of unspecified site of left female breast: Secondary | ICD-10-CM

## 2017-09-20 DIAGNOSIS — I10 Essential (primary) hypertension: Secondary | ICD-10-CM

## 2017-09-20 DIAGNOSIS — C50919 Malignant neoplasm of unspecified site of unspecified female breast: Secondary | ICD-10-CM

## 2017-09-20 DIAGNOSIS — C7951 Secondary malignant neoplasm of bone: Secondary | ICD-10-CM

## 2017-09-20 DIAGNOSIS — E119 Type 2 diabetes mellitus without complications: Secondary | ICD-10-CM | POA: Diagnosis not present

## 2017-09-20 DIAGNOSIS — Z95828 Presence of other vascular implants and grafts: Secondary | ICD-10-CM

## 2017-09-20 DIAGNOSIS — Z5111 Encounter for antineoplastic chemotherapy: Secondary | ICD-10-CM | POA: Diagnosis not present

## 2017-09-20 DIAGNOSIS — M069 Rheumatoid arthritis, unspecified: Secondary | ICD-10-CM

## 2017-09-20 DIAGNOSIS — Z170 Estrogen receptor positive status [ER+]: Secondary | ICD-10-CM | POA: Insufficient documentation

## 2017-09-20 LAB — CBC WITH DIFFERENTIAL (CANCER CENTER ONLY)
BASOS ABS: 0 10*3/uL (ref 0.0–0.1)
Basophils Relative: 0 %
Eosinophils Absolute: 0.1 10*3/uL (ref 0.0–0.5)
Eosinophils Relative: 4 %
HCT: 34 % — ABNORMAL LOW (ref 34.8–46.6)
HEMOGLOBIN: 11.1 g/dL — AB (ref 11.6–15.9)
LYMPHS ABS: 0.4 10*3/uL — AB (ref 0.9–3.3)
LYMPHS PCT: 11 %
MCH: 31.2 pg (ref 25.1–34.0)
MCHC: 32.6 g/dL (ref 31.5–36.0)
MCV: 95.8 fL (ref 79.5–101.0)
Monocytes Absolute: 0.4 10*3/uL (ref 0.1–0.9)
Monocytes Relative: 11 %
NEUTROS PCT: 74 %
Neutro Abs: 2.8 10*3/uL (ref 1.5–6.5)
Platelet Count: 201 10*3/uL (ref 145–400)
RBC: 3.55 MIL/uL — AB (ref 3.70–5.45)
RDW: 15 % — ABNORMAL HIGH (ref 11.2–14.5)
WBC Count: 3.8 10*3/uL — ABNORMAL LOW (ref 3.9–10.3)

## 2017-09-20 LAB — CMP (CANCER CENTER ONLY)
ALT: 10 U/L (ref 0–44)
ANION GAP: 11 (ref 5–15)
AST: 16 U/L (ref 15–41)
Albumin: 3.5 g/dL (ref 3.5–5.0)
Alkaline Phosphatase: 129 U/L — ABNORMAL HIGH (ref 38–126)
BUN: 13 mg/dL (ref 8–23)
CHLORIDE: 105 mmol/L (ref 98–111)
CO2: 25 mmol/L (ref 22–32)
Calcium: 7.8 mg/dL — ABNORMAL LOW (ref 8.9–10.3)
Creatinine: 1.11 mg/dL — ABNORMAL HIGH (ref 0.44–1.00)
GFR, EST AFRICAN AMERICAN: 59 mL/min — AB (ref >60)
GFR, EST NON AFRICAN AMERICAN: 51 mL/min — AB (ref >60)
Glucose, Bld: 81 mg/dL (ref 70–99)
POTASSIUM: 3.6 mmol/L (ref 3.5–5.1)
Sodium: 141 mmol/L (ref 135–145)
Total Bilirubin: 0.3 mg/dL (ref 0.3–1.2)
Total Protein: 6.7 g/dL (ref 6.5–8.1)

## 2017-09-20 MED ORDER — DEXAMETHASONE SODIUM PHOSPHATE 10 MG/ML IJ SOLN
10.0000 mg | Freq: Once | INTRAMUSCULAR | Status: AC
  Administered 2017-09-20: 10 mg via INTRAVENOUS

## 2017-09-20 MED ORDER — SODIUM CHLORIDE 0.9 % IV SOLN
80.0000 mg/m2 | Freq: Once | INTRAVENOUS | Status: AC
  Administered 2017-09-20: 162 mg via INTRAVENOUS
  Filled 2017-09-20: qty 27

## 2017-09-20 MED ORDER — SODIUM CHLORIDE 0.9% FLUSH
10.0000 mL | INTRAVENOUS | Status: DC | PRN
  Administered 2017-09-20: 10 mL
  Filled 2017-09-20: qty 10

## 2017-09-20 MED ORDER — DEXAMETHASONE SODIUM PHOSPHATE 10 MG/ML IJ SOLN
INTRAMUSCULAR | Status: AC
  Filled 2017-09-20: qty 1

## 2017-09-20 MED ORDER — SODIUM CHLORIDE 0.9% FLUSH
10.0000 mL | Freq: Once | INTRAVENOUS | Status: AC
  Administered 2017-09-20: 10 mL
  Filled 2017-09-20: qty 10

## 2017-09-20 MED ORDER — FAMOTIDINE IN NACL 20-0.9 MG/50ML-% IV SOLN
INTRAVENOUS | Status: AC
  Filled 2017-09-20: qty 50

## 2017-09-20 MED ORDER — HEPARIN SOD (PORK) LOCK FLUSH 100 UNIT/ML IV SOLN
500.0000 [IU] | Freq: Once | INTRAVENOUS | Status: AC | PRN
  Administered 2017-09-20: 500 [IU]
  Filled 2017-09-20: qty 5

## 2017-09-20 MED ORDER — SODIUM CHLORIDE 0.9 % IV SOLN
Freq: Once | INTRAVENOUS | Status: AC
  Administered 2017-09-20: 11:00:00 via INTRAVENOUS
  Filled 2017-09-20: qty 250

## 2017-09-20 MED ORDER — FAMOTIDINE IN NACL 20-0.9 MG/50ML-% IV SOLN
20.0000 mg | Freq: Once | INTRAVENOUS | Status: AC
  Administered 2017-09-20: 20 mg via INTRAVENOUS

## 2017-09-20 MED ORDER — DIPHENHYDRAMINE HCL 50 MG/ML IJ SOLN
25.0000 mg | Freq: Once | INTRAMUSCULAR | Status: AC
  Administered 2017-09-20: 25 mg via INTRAVENOUS

## 2017-09-20 MED ORDER — DIPHENHYDRAMINE HCL 50 MG/ML IJ SOLN
INTRAMUSCULAR | Status: AC
  Filled 2017-09-20: qty 1

## 2017-09-20 NOTE — Patient Instructions (Signed)
[facility] Discharge Instructions for Patients Receiving Chemotherapy  Today you received the following chemotherapy agents: Paclitaxel (Taxol)  To help prevent nausea and vomiting after your treatment, we encourage you to take your nausea medication as prescribed. If you develop nausea and vomiting that is not controlled by your nausea medication, call the clinic.   BELOW ARE SYMPTOMS THAT SHOULD BE REPORTED IMMEDIATELY:  *FEVER GREATER THAN 100.5 F  *CHILLS WITH OR WITHOUT FEVER  NAUSEA AND VOMITING THAT IS NOT CONTROLLED WITH YOUR NAUSEA MEDICATION  *UNUSUAL SHORTNESS OF BREATH  *UNUSUAL BRUISING OR BLEEDING  TENDERNESS IN MOUTH AND THROAT WITH OR WITHOUT PRESENCE OF ULCERS  *URINARY PROBLEMS  *BOWEL PROBLEMS  UNUSUAL RASH Items with * indicate a potential emergency and should be followed up as soon as possible.  Feel free to call the clinic should you have any questions or concerns. The clinic phone number is [phone].  Please show the CHEMO ALERT CARD at check-in to the Emergency Department and triage nurse.   

## 2017-09-20 NOTE — Progress Notes (Signed)
[facility] OFFICE PROGRESS NOTE   Diagnosis: Breast cancer  INTERVAL HISTORY:   [name] returns as scheduled.  She completed a cycle of Taxol 09/06/2017.  She had some dry heaves last week.  No mouth sores.  No diarrhea or constipation.  Neuropathy symptoms are unchanged.  Pain is stable.  She continues the current pain medication regimen.  Objective:  Vital signs in last 24 hours:  Blood pressure 137/61, pulse 91, temperature 98.6 F (37 C), temperature source Oral, resp. rate 18, height 5' 2.5" (1.588 m), weight 192 lb 4.8 oz (87.2 kg), SpO2 99 %.    HEENT: No thrush or ulcers. Resp: Lungs clear bilaterally. Cardio: Regular rate and rhythm. GI: Abdomen soft and nontender.  No hepatomegaly. Vascular: No leg edema. Neuro: Alert and oriented. Skin: No rash. Port-A-Cath without erythema.   Lab Results:  Lab Results  Component Value Date   WBC 3.8 (L) 09/20/2017   HGB 11.1 (L) 09/20/2017   HCT 34.0 (L) 09/20/2017   MCV 95.8 09/20/2017   PLT 201 09/20/2017   NEUTROABS 2.8 09/20/2017    Imaging:  No results found.  Medications: I have reviewed the patient's current medications.  Assessment/Plan: 1. 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 ER positive, PR positive, HER-2 negative She completed 4 cycles of adjuvant AC chemotherapy June 15 through September 03, 2008, and then completed the left chest wall radiation. She began Arimidex following an office visit on November 11, 2008.  Bone scan 06/01/2013 suggestive of thoracic spine metastases, thoracic MRI 06/27/2011 consistent with multiple bone metastases involving the thoracolumbar spine   PET scan 07/09/2013 with multiple hypermetabolic bone lesions and hypermetabolic mediastinal nodes.   Left iliac lesion biopsy 07/26/2013. Pathology showed metastatic carcinoma, ER  positive, PR positive, HER-2 negative   Initiation of Faslodex 08/10/2013.  Xgeva every 3 months initiated 07/27/2013.  Restaging PET scan 01/23/2014 with no residual hypermetabolic mediastinal disease; marked improvement in the metastatic bone disease.  Restaging PET scan 02/17/2015 with recurrence of skeletal metastasis. New hypermetabolic lesions within the pelvis and ribs. No hypermetabolic lymphadenopathy.  Continuation of Faslodex; initiation of Ibrance 03/05/2015  Cycle 2 Ibrance 04/09/2015-dose reduced to 100 mg daily  Cycle 3 Ibrance 05/08/2015  Cycle 4 Ibrance 06/11/2015  Cycle 5 Ibrance 07/15/2015  PET scan 08/12/2015-increase in size and increased metabolic activity associated with bone metastases, no new lesions  Ibrance/Faslodex discontinued  Tamoxifen started 08/07/2015  PET scan 02/04/2016 with new hypermetabolic liver lesions and numerous new skeletal lesions.  Xeloda, 7 days on/7 days off initiated February 2018  Xeloda dose reduced secondary to hand/foot syndrome beginning on 05/04/2016  Restaging MRI liver 05/18/2016-2 dominant hepatic metastases measuring up to 13 mm favored to be improved.  Xeloda continued  PET scan 11/12/2016-significant improvement with complete resolution of multiple hepatic hypermetabolic lesions and most of the prior bony hypermetabolic lesions. The remaining hypermetabolic hepatic and bony lesions are smaller and/or demonstrate significantly reduced activity.  Xeloda continued  MRI abdomen 05/16/2017-mixed response to therapy. 1 previously noted liver lesion in segment 3 completely resolved; other previously noted hepatic lesion increased significantly in size. 2 new liver lesions. Previously noted lesions in the lumbar spine similar.  Cycle 1 weekly Taxol 05/30/2017  Cycle 2 weekly Taxol 06/27/2017  Cycle 3 weekly Taxol 07/25/2017  MRI abdomen 731 2019-3 previously noted hepatic lesions appear stable to slightly decreased  in  size; one new hepatic lesion measuring 1.2 x 0.9 cm.  Widespread metastatic disease to the bones redemonstrated.  Taxol continued every 2 weeks 2. Left chest subcutaneous mass noted on exam October 04, 2008, status post biopsy by Dr. Brantley Stage with a benign pathology. 3. History of delayed healing of the left mastectomy incision. 4. Rheumatoid arthritis. 5. Diabetes. 6. Hypercholesterolemia. 7. Hypertension. 8. Family history of breast cancer. She has been evaluated at the genetic screening clinic. 9. 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 10. 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 Hand/foot syndrome secondary to Xeloda-Xeloda dose reduced 05/04/2016  Disposition: [name] appears unchanged.  There is no clinical evidence of disease progression.  Plan to continue Taxol every 2 weeks.  We reviewed the CBC from today.  Counts are adequate for treatment.  She will return for lab, follow-up and Taxol in 2 weeks.  She will contact the office in the interim with any problems.    [name] ANP/GNP-BC   09/20/2017  9:46 AM

## 2017-09-21 LAB — CANCER ANTIGEN 27.29: CAN 27.29: 68.9 U/mL — AB (ref 0.0–38.6)

## 2017-10-02 ENCOUNTER — Other Ambulatory Visit: Payer: Self-pay | Admitting: Oncology

## 2017-10-03 ENCOUNTER — Inpatient Hospital Stay: Payer: Medicare Other

## 2017-10-03 ENCOUNTER — Telehealth: Payer: Self-pay | Admitting: Nurse Practitioner

## 2017-10-03 ENCOUNTER — Encounter: Payer: Self-pay | Admitting: Nurse Practitioner

## 2017-10-03 ENCOUNTER — Inpatient Hospital Stay (HOSPITAL_BASED_OUTPATIENT_CLINIC_OR_DEPARTMENT_OTHER): Payer: Medicare Other | Admitting: Nurse Practitioner

## 2017-10-03 ENCOUNTER — Encounter: Payer: Self-pay | Admitting: General Practice

## 2017-10-03 VITALS — BP 149/61 | HR 86 | Temp 98.9°F | Resp 18 | Ht 62.5 in | Wt 190.5 lb

## 2017-10-03 DIAGNOSIS — C50912 Malignant neoplasm of unspecified site of left female breast: Secondary | ICD-10-CM

## 2017-10-03 DIAGNOSIS — C50919 Malignant neoplasm of unspecified site of unspecified female breast: Secondary | ICD-10-CM

## 2017-10-03 DIAGNOSIS — M069 Rheumatoid arthritis, unspecified: Secondary | ICD-10-CM

## 2017-10-03 DIAGNOSIS — E119 Type 2 diabetes mellitus without complications: Secondary | ICD-10-CM

## 2017-10-03 DIAGNOSIS — I10 Essential (primary) hypertension: Secondary | ICD-10-CM | POA: Diagnosis not present

## 2017-10-03 DIAGNOSIS — Z95828 Presence of other vascular implants and grafts: Secondary | ICD-10-CM

## 2017-10-03 DIAGNOSIS — R194 Change in bowel habit: Secondary | ICD-10-CM

## 2017-10-03 DIAGNOSIS — C7951 Secondary malignant neoplasm of bone: Secondary | ICD-10-CM | POA: Diagnosis not present

## 2017-10-03 DIAGNOSIS — Z5111 Encounter for antineoplastic chemotherapy: Secondary | ICD-10-CM | POA: Diagnosis not present

## 2017-10-03 LAB — CMP (CANCER CENTER ONLY)
ALBUMIN: 3.3 g/dL — AB (ref 3.5–5.0)
ALK PHOS: 132 U/L — AB (ref 38–126)
ALT: 8 U/L (ref 0–44)
ANION GAP: 11 (ref 5–15)
AST: 17 U/L (ref 15–41)
BILIRUBIN TOTAL: 0.3 mg/dL (ref 0.3–1.2)
BUN: 13 mg/dL (ref 8–23)
CALCIUM: 8.7 mg/dL — AB (ref 8.9–10.3)
CO2: 25 mmol/L (ref 22–32)
CREATININE: 1.03 mg/dL — AB (ref 0.44–1.00)
Chloride: 105 mmol/L (ref 98–111)
GFR, Est AFR Am: >60 mL/min (ref >60)
GFR, Estimated: 55 mL/min — ABNORMAL LOW (ref >60)
GLUCOSE: 76 mg/dL (ref 70–99)
Potassium: 3.6 mmol/L (ref 3.5–5.1)
SODIUM: 141 mmol/L (ref 135–145)
TOTAL PROTEIN: 6.5 g/dL (ref 6.5–8.1)

## 2017-10-03 LAB — CBC WITH DIFFERENTIAL (CANCER CENTER ONLY)
BASOS ABS: 0 10*3/uL (ref 0.0–0.1)
BASOS PCT: 0 %
EOS ABS: 0.1 10*3/uL (ref 0.0–0.5)
EOS PCT: 4 %
HCT: 34.2 % — ABNORMAL LOW (ref 34.8–46.6)
Hemoglobin: 10.9 g/dL — ABNORMAL LOW (ref 11.6–15.9)
Lymphocytes Relative: 13 %
Lymphs Abs: 0.5 10*3/uL — ABNORMAL LOW (ref 0.9–3.3)
MCH: 30.7 pg (ref 25.1–34.0)
MCHC: 31.9 g/dL (ref 31.5–36.0)
MCV: 96.3 fL (ref 79.5–101.0)
MONO ABS: 0.6 10*3/uL (ref 0.1–0.9)
MONOS PCT: 16 %
Neutro Abs: 2.6 10*3/uL (ref 1.5–6.5)
Neutrophils Relative %: 67 %
PLATELETS: 149 10*3/uL (ref 145–400)
RBC: 3.55 MIL/uL — ABNORMAL LOW (ref 3.70–5.45)
RDW: 14.3 % (ref 11.2–14.5)
WBC Count: 3.9 10*3/uL (ref 3.9–10.3)

## 2017-10-03 MED ORDER — DEXAMETHASONE SODIUM PHOSPHATE 10 MG/ML IJ SOLN
10.0000 mg | Freq: Once | INTRAMUSCULAR | Status: AC
  Administered 2017-10-03: 10 mg via INTRAVENOUS

## 2017-10-03 MED ORDER — HEPARIN SOD (PORK) LOCK FLUSH 100 UNIT/ML IV SOLN
500.0000 [IU] | Freq: Once | INTRAVENOUS | Status: AC | PRN
  Administered 2017-10-03: 500 [IU]
  Filled 2017-10-03: qty 5

## 2017-10-03 MED ORDER — FAMOTIDINE IN NACL 20-0.9 MG/50ML-% IV SOLN
20.0000 mg | Freq: Once | INTRAVENOUS | Status: AC
  Administered 2017-10-03: 20 mg via INTRAVENOUS

## 2017-10-03 MED ORDER — SODIUM CHLORIDE 0.9 % IV SOLN
Freq: Once | INTRAVENOUS | Status: AC
  Administered 2017-10-03: 14:00:00 via INTRAVENOUS
  Filled 2017-10-03: qty 250

## 2017-10-03 MED ORDER — SODIUM CHLORIDE 0.9% FLUSH
10.0000 mL | INTRAVENOUS | Status: DC | PRN
  Administered 2017-10-03: 10 mL
  Filled 2017-10-03: qty 10

## 2017-10-03 MED ORDER — SODIUM CHLORIDE 0.9% FLUSH
10.0000 mL | Freq: Once | INTRAVENOUS | Status: AC
  Administered 2017-10-03: 10 mL
  Filled 2017-10-03: qty 10

## 2017-10-03 MED ORDER — SODIUM CHLORIDE 0.9 % IV SOLN
80.0000 mg/m2 | Freq: Once | INTRAVENOUS | Status: AC
  Administered 2017-10-03: 162 mg via INTRAVENOUS
  Filled 2017-10-03: qty 27

## 2017-10-03 MED ORDER — DEXAMETHASONE SODIUM PHOSPHATE 10 MG/ML IJ SOLN
INTRAMUSCULAR | Status: AC
  Filled 2017-10-03: qty 1

## 2017-10-03 MED ORDER — FAMOTIDINE IN NACL 20-0.9 MG/50ML-% IV SOLN
INTRAVENOUS | Status: AC
  Filled 2017-10-03: qty 50

## 2017-10-03 MED ORDER — DIPHENHYDRAMINE HCL 50 MG/ML IJ SOLN
25.0000 mg | Freq: Once | INTRAMUSCULAR | Status: AC
  Administered 2017-10-03: 25 mg via INTRAVENOUS

## 2017-10-03 MED ORDER — DIPHENHYDRAMINE HCL 50 MG/ML IJ SOLN
INTRAMUSCULAR | Status: AC
  Filled 2017-10-03: qty 1

## 2017-10-03 NOTE — Progress Notes (Signed)
[name] OFFICE PROGRESS NOTE   Diagnosis: Breast cancer  INTERVAL HISTORY:   [name] returns as scheduled.  She completed another cycle of Taxol 09/20/2017.  She had "dry heaves" for the first few days.  No mouth sores.  She continues to have periodic loose stools.  Stable neuropathy symptoms in 3 fingers on the right hand.  Objective:  Vital signs in last 24 hours:  Blood pressure (!) 149/61, pulse 86, temperature 98.9 F (37.2 C), temperature source Oral, resp. rate 18, height 5' 2.5" (1.588 m), weight 190 lb 8 oz (86.4 kg), SpO2 97 %.    HEENT: No thrush or ulcers. Resp: Lungs clear bilaterally. Cardio: Regular rate and rhythm. GI: Abdomen soft and nontender.  No hepatomegaly. Vascular: No leg edema. Port-A-Cath without erythema.  Lab Results:  Lab Results  Component Value Date   WBC 3.9 10/03/2017   HGB 10.9 (L) 10/03/2017   HCT 34.2 (L) 10/03/2017   MCV 96.3 10/03/2017   PLT 149 10/03/2017   NEUTROABS 2.6 10/03/2017    Imaging:  No results found.  Medications: I have reviewed the patient's current medications.  Assessment/Plan: 1. 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 ER positive, PR positive, HER-2 negative She completed 4 cycles of adjuvant AC chemotherapy June 15 through September 03, 2008, and then completed the left chest wall radiation. She began Arimidex following an office visit on November 11, 2008.  Bone scan 06/01/2013 suggestive of thoracic spine metastases, thoracic MRI 06/27/2011 consistent with multiple bone metastases involving the thoracolumbar spine   PET scan 07/09/2013 with multiple hypermetabolic bone lesions and hypermetabolic mediastinal nodes.   Left iliac lesion biopsy 07/26/2013. Pathology showed metastatic carcinoma, ER positive, PR positive, HER-2 negative   Initiation of  Faslodex 08/10/2013.  Xgeva every 3 months initiated 07/27/2013.  Restaging PET scan 01/23/2014 with no residual hypermetabolic mediastinal disease; marked improvement in the metastatic bone disease.  Restaging PET scan 02/17/2015 with recurrence of skeletal metastasis. New hypermetabolic lesions within the pelvis and ribs. No hypermetabolic lymphadenopathy.  Continuation of Faslodex; initiation of Ibrance 03/05/2015  Cycle 2 Ibrance 04/09/2015-dose reduced to 100 mg daily  Cycle 3 Ibrance 05/08/2015  Cycle 4 Ibrance 06/11/2015  Cycle 5 Ibrance 07/15/2015  PET scan 08/12/2015-increase in size and increased metabolic activity associated with bone metastases, no new lesions  Ibrance/Faslodex discontinued  Tamoxifen started 08/07/2015  PET scan 02/04/2016 with new hypermetabolic liver lesions and numerous new skeletal lesions.  Xeloda, 7 days on/7 days off initiated February 2018  Xeloda dose reduced secondary to hand/foot syndrome beginning on 05/04/2016  Restaging MRI liver 05/18/2016-2 dominant hepatic metastases measuring up to 13 mm favored to be improved.  Xeloda continued  PET scan 11/12/2016-significant improvement with complete resolution of multiple hepatic hypermetabolic lesions and most of the prior bony hypermetabolic lesions. The remaining hypermetabolic hepatic and bony lesions are smaller and/or demonstrate significantly reduced activity.  Xeloda continued  MRI abdomen 05/16/2017-mixed response to therapy. 1 previously noted liver lesion in segment 3 completely resolved; other previously noted hepatic lesion increased significantly in size. 2 new liver lesions. Previously noted lesions in the lumbar spine similar.  Cycle 1 weekly Taxol 05/30/2017  Cycle 2 weekly Taxol 06/27/2017  Cycle 3 weekly Taxol 07/25/2017  MRI abdomen 731 2019-3 previously noted hepatic lesions appear stable to slightly decreased in size; one new hepatic lesion measuring 1.2 x 0.9 cm.  Widespread metastatic disease to the bones redemonstrated.  Taxol continued every 2 weeks 2. Left chest subcutaneous mass noted on exam October 04, 2008, status post biopsy by Dr. Brantley Stage with a benign pathology. 3. History of delayed healing of the left mastectomy incision. 4. Rheumatoid arthritis. 5. Diabetes. 6. Hypercholesterolemia. 7. Hypertension. 8. Family history of breast cancer. She has been evaluated at the genetic screening clinic. 9. 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 10. 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 Hand/foot syndrome secondary to Xeloda-Xeloda dose reduced 05/04/2016  Disposition: [name] appears unchanged.  There is no clinical evidence of disease progression.  Plan to continue Taxol every 2 weeks.  We reviewed the CBC from today.  Counts are adequate for treatment.  She will return for lab, follow-up and Taxol on 10/18/2017.  She will contact the office in the interim with any problems.    [name] ANP/GNP-BC   10/03/2017  12:35 PM

## 2017-10-03 NOTE — Telephone Encounter (Signed)
No 9/16 los.

## 2017-10-03 NOTE — Patient Instructions (Signed)
[facility] Discharge Instructions for Patients Receiving Chemotherapy  Today you received the following chemotherapy agents:  Taxol.  To help prevent nausea and vomiting after your treatment, we encourage you to take your nausea medication as directed.   If you develop nausea and vomiting that is not controlled by your nausea medication, call the clinic.   BELOW ARE SYMPTOMS THAT SHOULD BE REPORTED IMMEDIATELY:  *FEVER GREATER THAN 100.5 F  *CHILLS WITH OR WITHOUT FEVER  NAUSEA AND VOMITING THAT IS NOT CONTROLLED WITH YOUR NAUSEA MEDICATION  *UNUSUAL SHORTNESS OF BREATH  *UNUSUAL BRUISING OR BLEEDING  TENDERNESS IN MOUTH AND THROAT WITH OR WITHOUT PRESENCE OF ULCERS  *URINARY PROBLEMS  *BOWEL PROBLEMS  UNUSUAL RASH Items with * indicate a potential emergency and should be followed up as soon as possible.  Feel free to call the clinic should you have any questions or concerns. The clinic phone number is [phone].  Please show the CHEMO ALERT CARD at check-in to the Emergency Department and triage nurse.   

## 2017-10-03 NOTE — Progress Notes (Signed)
[facility] Spiritual Care Note  Followed up with H[facility] in infusion as planned. Per pt, she is working both on the logistical/financial arrangements that her husband's death has precipitated and on her own grief, including telling her well-intentioned family that she needs some space to grieve and process on her own. Normalized feelings, providing pastoral reflection, affirmation, and encouragement. Suggested grief counseling and/or support groups as potential resources for extended family as well, normalizing the seeking of help and shared wisdom to cope with guilt, sorrow, and other feelings that naturally arise in grief.   Kristalyn and I plan to f/u when she is next in infusion, but she knows to phone or request a page in between as well.   [place], [place], [facility] Pager [phone] Voicemail [phone]

## 2017-10-16 ENCOUNTER — Other Ambulatory Visit: Payer: Self-pay | Admitting: Oncology

## 2017-10-18 ENCOUNTER — Encounter: Payer: Self-pay | Admitting: General Practice

## 2017-10-18 ENCOUNTER — Inpatient Hospital Stay (HOSPITAL_BASED_OUTPATIENT_CLINIC_OR_DEPARTMENT_OTHER): Payer: Medicare Other | Admitting: Oncology

## 2017-10-18 ENCOUNTER — Inpatient Hospital Stay: Payer: Medicare Other

## 2017-10-18 ENCOUNTER — Telehealth: Payer: Self-pay

## 2017-10-18 ENCOUNTER — Inpatient Hospital Stay: Payer: Medicare Other | Attending: Oncology

## 2017-10-18 DIAGNOSIS — C50912 Malignant neoplasm of unspecified site of left female breast: Secondary | ICD-10-CM

## 2017-10-18 DIAGNOSIS — C50919 Malignant neoplasm of unspecified site of unspecified female breast: Secondary | ICD-10-CM | POA: Diagnosis present

## 2017-10-18 DIAGNOSIS — C7951 Secondary malignant neoplasm of bone: Secondary | ICD-10-CM | POA: Insufficient documentation

## 2017-10-18 DIAGNOSIS — Z170 Estrogen receptor positive status [ER+]: Secondary | ICD-10-CM | POA: Insufficient documentation

## 2017-10-18 DIAGNOSIS — E119 Type 2 diabetes mellitus without complications: Secondary | ICD-10-CM | POA: Insufficient documentation

## 2017-10-18 DIAGNOSIS — Z5111 Encounter for antineoplastic chemotherapy: Secondary | ICD-10-CM | POA: Diagnosis present

## 2017-10-18 DIAGNOSIS — M898X9 Other specified disorders of bone, unspecified site: Secondary | ICD-10-CM | POA: Diagnosis not present

## 2017-10-18 DIAGNOSIS — I10 Essential (primary) hypertension: Secondary | ICD-10-CM

## 2017-10-18 DIAGNOSIS — Z23 Encounter for immunization: Secondary | ICD-10-CM

## 2017-10-18 DIAGNOSIS — Z95828 Presence of other vascular implants and grafts: Secondary | ICD-10-CM

## 2017-10-18 DIAGNOSIS — M069 Rheumatoid arthritis, unspecified: Secondary | ICD-10-CM

## 2017-10-18 LAB — CMP (CANCER CENTER ONLY)
ALBUMIN: 3.4 g/dL — AB (ref 3.5–5.0)
ALT: 12 U/L (ref 0–44)
AST: 18 U/L (ref 15–41)
Alkaline Phosphatase: 129 U/L — ABNORMAL HIGH (ref 38–126)
Anion gap: 10 (ref 5–15)
BUN: 10 mg/dL (ref 8–23)
CHLORIDE: 105 mmol/L (ref 98–111)
CO2: 26 mmol/L (ref 22–32)
CREATININE: 1 mg/dL (ref 0.44–1.00)
Calcium: 8.7 mg/dL — ABNORMAL LOW (ref 8.9–10.3)
GFR, Estimated: 57 mL/min — ABNORMAL LOW (ref >60)
GLUCOSE: 94 mg/dL (ref 70–99)
Potassium: 3.5 mmol/L (ref 3.5–5.1)
SODIUM: 141 mmol/L (ref 135–145)
Total Bilirubin: 0.3 mg/dL (ref 0.3–1.2)
Total Protein: 6.5 g/dL (ref 6.5–8.1)

## 2017-10-18 LAB — CBC WITH DIFFERENTIAL (CANCER CENTER ONLY)
BASOS PCT: 0 %
Basophils Absolute: 0 10*3/uL (ref 0.0–0.1)
EOS ABS: 0.1 10*3/uL (ref 0.0–0.5)
Eosinophils Relative: 2 %
HEMATOCRIT: 33 % — AB (ref 34.8–46.6)
HEMOGLOBIN: 10.8 g/dL — AB (ref 11.6–15.9)
Lymphocytes Relative: 11 %
Lymphs Abs: 0.4 10*3/uL — ABNORMAL LOW (ref 0.9–3.3)
MCH: 30.9 pg (ref 25.1–34.0)
MCHC: 32.7 g/dL (ref 31.5–36.0)
MCV: 94.3 fL (ref 79.5–101.0)
MONOS PCT: 12 %
Monocytes Absolute: 0.5 10*3/uL (ref 0.1–0.9)
NEUTROS ABS: 3.1 10*3/uL (ref 1.5–6.5)
NEUTROS PCT: 75 %
PLATELETS: 195 10*3/uL (ref 145–400)
RBC: 3.5 MIL/uL — ABNORMAL LOW (ref 3.70–5.45)
RDW: 15.4 % — ABNORMAL HIGH (ref 11.2–14.5)
WBC: 4.2 10*3/uL (ref 3.9–10.3)

## 2017-10-18 MED ORDER — HEPARIN SOD (PORK) LOCK FLUSH 100 UNIT/ML IV SOLN
500.0000 [IU] | Freq: Once | INTRAVENOUS | Status: AC | PRN
  Administered 2017-10-18: 500 [IU]
  Filled 2017-10-18: qty 5

## 2017-10-18 MED ORDER — DIAZEPAM 5 MG PO TABS: 5.0000 mg | ORAL_TABLET | Freq: Three times a day (TID) | ORAL | 0 refills | Status: DC | PRN

## 2017-10-18 MED ORDER — DENOSUMAB 120 MG/1.7ML ~~LOC~~ SOLN
120.0000 mg | Freq: Once | SUBCUTANEOUS | Status: AC
  Administered 2017-10-18: 120 mg via SUBCUTANEOUS

## 2017-10-18 MED ORDER — DEXAMETHASONE SODIUM PHOSPHATE 10 MG/ML IJ SOLN
10.0000 mg | Freq: Once | INTRAMUSCULAR | Status: AC
  Administered 2017-10-18: 10 mg via INTRAVENOUS

## 2017-10-18 MED ORDER — INFLUENZA VAC SPLIT QUAD 0.5 ML IM SUSY
0.5000 mL | PREFILLED_SYRINGE | Freq: Once | INTRAMUSCULAR | Status: AC
  Administered 2017-10-18: 0.5 mL via INTRAMUSCULAR

## 2017-10-18 MED ORDER — OXYCODONE-ACETAMINOPHEN 10-325 MG PO TABS: 1.0000 | ORAL_TABLET | ORAL | 0 refills | Status: DC | PRN

## 2017-10-18 MED ORDER — OXYCODONE HCL ER 20 MG PO T12A: EXTENDED_RELEASE_TABLET | ORAL | 0 refills | Status: DC

## 2017-10-18 MED ORDER — DIPHENHYDRAMINE HCL 50 MG/ML IJ SOLN
25.0000 mg | Freq: Once | INTRAMUSCULAR | Status: AC
  Administered 2017-10-18: 25 mg via INTRAVENOUS

## 2017-10-18 MED ORDER — DENOSUMAB 120 MG/1.7ML ~~LOC~~ SOLN
SUBCUTANEOUS | Status: AC
  Filled 2017-10-18: qty 1.7

## 2017-10-18 MED ORDER — INFLUENZA VAC SPLIT QUAD 0.5 ML IM SUSY
PREFILLED_SYRINGE | INTRAMUSCULAR | Status: AC
  Filled 2017-10-18: qty 0.5

## 2017-10-18 MED ORDER — SODIUM CHLORIDE 0.9 % IV SOLN
80.0000 mg/m2 | Freq: Once | INTRAVENOUS | Status: AC
  Administered 2017-10-18: 162 mg via INTRAVENOUS
  Filled 2017-10-18: qty 27

## 2017-10-18 MED ORDER — SODIUM CHLORIDE 0.9% FLUSH
10.0000 mL | Freq: Once | INTRAVENOUS | Status: AC
  Administered 2017-10-18: 10 mL
  Filled 2017-10-18: qty 10

## 2017-10-18 MED ORDER — DIPHENHYDRAMINE HCL 50 MG/ML IJ SOLN
INTRAMUSCULAR | Status: AC
  Filled 2017-10-18: qty 1

## 2017-10-18 MED ORDER — SODIUM CHLORIDE 0.9% FLUSH
10.0000 mL | INTRAVENOUS | Status: DC | PRN
  Administered 2017-10-18: 10 mL
  Filled 2017-10-18: qty 10

## 2017-10-18 MED ORDER — DEXAMETHASONE SODIUM PHOSPHATE 10 MG/ML IJ SOLN
INTRAMUSCULAR | Status: AC
  Filled 2017-10-18: qty 1

## 2017-10-18 MED ORDER — SODIUM CHLORIDE 0.9 % IV SOLN
Freq: Once | INTRAVENOUS | Status: AC
  Administered 2017-10-18: 11:00:00 via INTRAVENOUS
  Filled 2017-10-18: qty 250

## 2017-10-18 MED ORDER — FAMOTIDINE IN NACL 20-0.9 MG/50ML-% IV SOLN
20.0000 mg | Freq: Once | INTRAVENOUS | Status: AC
  Administered 2017-10-18: 20 mg via INTRAVENOUS

## 2017-10-18 MED ORDER — FAMOTIDINE IN NACL 20-0.9 MG/50ML-% IV SOLN
INTRAVENOUS | Status: AC
  Filled 2017-10-18: qty 50

## 2017-10-18 MED FILL — OxyCONTIN 20 MG T12A: 20 | 30 days supply | Qty: 90 | Fill #0

## 2017-10-18 MED FILL — diazePAM 5 MG TABS: 5 | 20 days supply | Qty: 60 | Fill #0

## 2017-10-18 MED FILL — OXYCODONE-APAP 10-325: 10-325 | 30 days supply | Qty: 180 | Fill #0

## 2017-10-18 NOTE — Patient Instructions (Signed)
[facility] Discharge Instructions for Patients Receiving Chemotherapy  Today you received the following chemotherapy agents:  Taxol.  To help prevent nausea and vomiting after your treatment, we encourage you to take your nausea medication as directed.   If you develop nausea and vomiting that is not controlled by your nausea medication, call the clinic.   BELOW ARE SYMPTOMS THAT SHOULD BE REPORTED IMMEDIATELY:  *FEVER GREATER THAN 100.5 F  *CHILLS WITH OR WITHOUT FEVER  NAUSEA AND VOMITING THAT IS NOT CONTROLLED WITH YOUR NAUSEA MEDICATION  *UNUSUAL SHORTNESS OF BREATH  *UNUSUAL BRUISING OR BLEEDING  TENDERNESS IN MOUTH AND THROAT WITH OR WITHOUT PRESENCE OF ULCERS  *URINARY PROBLEMS  *BOWEL PROBLEMS  UNUSUAL RASH Items with * indicate a potential emergency and should be followed up as soon as possible.  Feel free to call the clinic should you have any questions or concerns. The clinic phone number is [phone].  Please show the CHEMO ALERT CARD at check-in to the Emergency Department and triage nurse.   

## 2017-10-18 NOTE — Progress Notes (Signed)
[name] OFFICE PROGRESS NOTE   Diagnosis: Breast cancer  INTERVAL HISTORY:   [name] completed another treatment with Taxol on 10/03/2017.  She reports tolerating the chemotherapy well.  Stable bone pain.  She has anxiety related to the recent death of her husband.  Objective:  Vital signs in last 24 hours:  Blood pressure 134/73, pulse 86, temperature 99 F (37.2 C), temperature source Oral, resp. rate 18, height 5' 2.5" (1.588 m), weight 192 lb 9.6 oz (87.4 kg), SpO2 97 %.    HEENT: No thrush or ulcers Resp: Lungs clear bilaterally Cardio: Regular rate and rhythm GI: No hepatomegaly, nontender Vascular: No leg edema   Portacath/PICC-without erythema  Lab Results:  Lab Results  Component Value Date   WBC 4.2 10/18/2017   HGB 10.8 (L) 10/18/2017   HCT 33.0 (L) 10/18/2017   MCV 94.3 10/18/2017   PLT 195 10/18/2017   NEUTROABS 3.1 10/18/2017    CMP  Lab Results  Component Value Date   NA 141 10/18/2017   K 3.5 10/18/2017   CL 105 10/18/2017   CO2 26 10/18/2017   GLUCOSE 94 10/18/2017   BUN 10 10/18/2017   CREATININE 1.00 10/18/2017   CALCIUM 8.7 (L) 10/18/2017   PROT 6.5 10/18/2017   ALBUMIN 3.4 (L) 10/18/2017   AST 18 10/18/2017   ALT 12 10/18/2017   ALKPHOS 129 (H) 10/18/2017   BILITOT 0.3 10/18/2017   GFRNONAA 57 (L) 10/18/2017   GFRAA >60 10/18/2017     Medications: I have reviewed the patient's current medications.   Assessment/Plan: 1. 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 ER positive, PR positive, HER-2 negative She completed 4 cycles of adjuvant AC chemotherapy June 15 through September 03, 2008, and then completed the left chest wall radiation. She began Arimidex following an office visit on November 11, 2008.  Bone scan 06/01/2013 suggestive of thoracic spine metastases, thoracic MRI  06/27/2011 consistent with multiple bone metastases involving the thoracolumbar spine   PET scan 07/09/2013 with multiple hypermetabolic bone lesions and hypermetabolic mediastinal nodes.   Left iliac lesion biopsy 07/26/2013. Pathology showed metastatic carcinoma, ER positive, PR positive, HER-2 negative   Initiation of Faslodex 08/10/2013.  Xgeva every 3 months initiated 07/27/2013.  Restaging PET scan 01/23/2014 with no residual hypermetabolic mediastinal disease; marked improvement in the metastatic bone disease.  Restaging PET scan 02/17/2015 with recurrence of skeletal metastasis. New hypermetabolic lesions within the pelvis and ribs. No hypermetabolic lymphadenopathy.  Continuation of Faslodex; initiation of Ibrance 03/05/2015  Cycle 2 Ibrance 04/09/2015-dose reduced to 100 mg daily  Cycle 3 Ibrance 05/08/2015  Cycle 4 Ibrance 06/11/2015  Cycle 5 Ibrance 07/15/2015  PET scan 08/12/2015-increase in size and increased metabolic activity associated with bone metastases, no new lesions  Ibrance/Faslodex discontinued  Tamoxifen started 08/07/2015  PET scan 02/04/2016 with new hypermetabolic liver lesions and numerous new skeletal lesions.  Xeloda, 7 days on/7 days off initiated February 2018  Xeloda dose reduced secondary to hand/foot syndrome beginning on 05/04/2016  Restaging MRI liver 05/18/2016-2 dominant hepatic metastases measuring up to 13 mm favored to be improved.  Xeloda continued  PET scan 11/12/2016-significant improvement with complete resolution of multiple hepatic hypermetabolic lesions and most of the prior bony hypermetabolic lesions. The remaining hypermetabolic hepatic and bony lesions are smaller and/or demonstrate significantly reduced activity.  Xeloda continued  MRI abdomen 05/16/2017-mixed response to therapy. 1  previously noted liver lesion in segment 3 completely resolved; other previously noted hepatic lesion increased significantly in size.  2 new liver lesions. Previously noted lesions in the lumbar spine similar.  Cycle 1 weekly Taxol 05/30/2017  Cycle 2 weekly Taxol 06/27/2017  Cycle 3 weekly Taxol 07/25/2017  MRI abdomen 08/17/2017-3 previously noted hepatic lesions appear stable to slightly decreased in size; one new hepatic lesion measuring 1.2 x 0.9 cm. Widespread metastatic disease to the bones redemonstrated.  Taxol continued every 2 weeks 2. Left chest subcutaneous mass noted on exam October 04, 2008, status post biopsy by Dr. Brantley Stage with a benign pathology. 3. History of delayed healing of the left mastectomy incision. 4. Rheumatoid arthritis. 5. Diabetes. 6. Hypercholesterolemia. 7. Hypertension. 8. Family history of breast cancer. She has been evaluated at the genetic screening clinic. 9. 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 10. 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 Hand/foot syndrome secondary to Xeloda-Xeloda dose reduced 05/04/2016  Disposition: [name] appears unchanged.  She will continue every 2-week Taxol.  She will receive Xgeva and an influenza vaccine today.  She is scheduled for an office visit and chemotherapy in 2 weeks.  We will schedule a restaging liver MRI in approximately 2 months.  15 minutes were spent with the patient today.  The majority of the time was used for counseling and coordination of care.  Betsy Coder, MD  10/18/2017  10:00 AM

## 2017-10-18 NOTE — Progress Notes (Signed)
[facility] Spiritual Care Note  Missed Gia in infusion today when she was done earlier than anticipated, but put her on my calendar for her 10/15 tx.   [place], [place], [facility] Pager [phone] Voicemail [phone]

## 2017-10-18 NOTE — Telephone Encounter (Signed)
PRINTED AVS AND CALENDER OF UPCOMING APPOINTMENT. PER 10/1 LOS

## 2017-10-19 LAB — CANCER ANTIGEN 27.29: CA 27.29: 76.8 U/mL — ABNORMAL HIGH (ref 0.0–38.6)

## 2017-11-01 ENCOUNTER — Inpatient Hospital Stay: Payer: Medicare Other

## 2017-11-01 ENCOUNTER — Encounter: Payer: Self-pay | Admitting: Nurse Practitioner

## 2017-11-01 ENCOUNTER — Encounter: Payer: Self-pay | Admitting: General Practice

## 2017-11-01 ENCOUNTER — Inpatient Hospital Stay (HOSPITAL_BASED_OUTPATIENT_CLINIC_OR_DEPARTMENT_OTHER): Payer: Medicare Other | Admitting: Nurse Practitioner

## 2017-11-01 ENCOUNTER — Telehealth: Payer: Self-pay | Admitting: Nurse Practitioner

## 2017-11-01 VITALS — BP 150/65 | HR 87 | Temp 98.7°F | Resp 18 | Ht 62.5 in | Wt 197.5 lb

## 2017-11-01 DIAGNOSIS — M069 Rheumatoid arthritis, unspecified: Secondary | ICD-10-CM

## 2017-11-01 DIAGNOSIS — C50912 Malignant neoplasm of unspecified site of left female breast: Secondary | ICD-10-CM

## 2017-11-01 DIAGNOSIS — C7951 Secondary malignant neoplasm of bone: Secondary | ICD-10-CM

## 2017-11-01 DIAGNOSIS — C50919 Malignant neoplasm of unspecified site of unspecified female breast: Secondary | ICD-10-CM | POA: Diagnosis not present

## 2017-11-01 DIAGNOSIS — I10 Essential (primary) hypertension: Secondary | ICD-10-CM

## 2017-11-01 DIAGNOSIS — E119 Type 2 diabetes mellitus without complications: Secondary | ICD-10-CM | POA: Diagnosis not present

## 2017-11-01 DIAGNOSIS — Z95828 Presence of other vascular implants and grafts: Secondary | ICD-10-CM

## 2017-11-01 DIAGNOSIS — Z5111 Encounter for antineoplastic chemotherapy: Secondary | ICD-10-CM | POA: Diagnosis not present

## 2017-11-01 LAB — CMP (CANCER CENTER ONLY)
ALBUMIN: 3.3 g/dL — AB (ref 3.5–5.0)
ALK PHOS: 152 U/L — AB (ref 38–126)
ALT: 7 U/L (ref 0–44)
AST: 19 U/L (ref 15–41)
Anion gap: 12 (ref 5–15)
BILIRUBIN TOTAL: 0.4 mg/dL (ref 0.3–1.2)
BUN: 12 mg/dL (ref 8–23)
CALCIUM: 8.9 mg/dL (ref 8.9–10.3)
CO2: 26 mmol/L (ref 22–32)
Chloride: 105 mmol/L (ref 98–111)
Creatinine: 0.97 mg/dL (ref 0.44–1.00)
GFR, Est AFR Am: >60 mL/min (ref >60)
GFR, Estimated: 60 mL/min — ABNORMAL LOW (ref >60)
GLUCOSE: 58 mg/dL — AB (ref 70–99)
Potassium: 3.5 mmol/L (ref 3.5–5.1)
SODIUM: 143 mmol/L (ref 135–145)
TOTAL PROTEIN: 6.9 g/dL (ref 6.5–8.1)

## 2017-11-01 LAB — CBC WITH DIFFERENTIAL (CANCER CENTER ONLY)
ABS IMMATURE GRANULOCYTES: 0.01 10*3/uL (ref 0.00–0.07)
Basophils Absolute: 0 10*3/uL (ref 0.0–0.1)
Basophils Relative: 0 %
EOS ABS: 0.2 10*3/uL (ref 0.0–0.5)
Eosinophils Relative: 4 %
HEMATOCRIT: 33.8 % — AB (ref 36.0–46.0)
Hemoglobin: 10.6 g/dL — ABNORMAL LOW (ref 12.0–15.0)
Immature Granulocytes: 0 %
Lymphocytes Relative: 14 %
Lymphs Abs: 0.7 10*3/uL (ref 0.7–4.0)
MCH: 30.1 pg (ref 26.0–34.0)
MCHC: 31.4 g/dL (ref 30.0–36.0)
MCV: 96 fL (ref 80.0–100.0)
MONO ABS: 0.6 10*3/uL (ref 0.1–1.0)
MONOS PCT: 12 %
Neutro Abs: 3.5 10*3/uL (ref 1.7–7.7)
Neutrophils Relative %: 70 %
PLATELETS: 188 10*3/uL (ref 150–400)
RBC: 3.52 MIL/uL — ABNORMAL LOW (ref 3.87–5.11)
RDW: 14.7 % (ref 11.5–15.5)
WBC Count: 5 10*3/uL (ref 4.0–10.5)
nRBC: 0 % (ref 0.0–0.2)

## 2017-11-01 MED ORDER — SODIUM CHLORIDE 0.9 % IV SOLN
80.0000 mg/m2 | Freq: Once | INTRAVENOUS | Status: AC
  Administered 2017-11-01: 162 mg via INTRAVENOUS
  Filled 2017-11-01: qty 27

## 2017-11-01 MED ORDER — SODIUM CHLORIDE 0.9 % IV SOLN
Freq: Once | INTRAVENOUS | Status: AC
  Administered 2017-11-01: 13:00:00 via INTRAVENOUS
  Filled 2017-11-01: qty 250

## 2017-11-01 MED ORDER — SODIUM CHLORIDE 0.9% FLUSH
10.0000 mL | Freq: Once | INTRAVENOUS | Status: AC
  Administered 2017-11-01: 10 mL
  Filled 2017-11-01: qty 10

## 2017-11-01 MED ORDER — DEXAMETHASONE SODIUM PHOSPHATE 10 MG/ML IJ SOLN
INTRAMUSCULAR | Status: AC
  Filled 2017-11-01: qty 1

## 2017-11-01 MED ORDER — DEXAMETHASONE SODIUM PHOSPHATE 10 MG/ML IJ SOLN
10.0000 mg | Freq: Once | INTRAMUSCULAR | Status: AC
  Administered 2017-11-01: 10 mg via INTRAVENOUS

## 2017-11-01 MED ORDER — FAMOTIDINE IN NACL 20-0.9 MG/50ML-% IV SOLN
20.0000 mg | Freq: Once | INTRAVENOUS | Status: AC
  Administered 2017-11-01: 20 mg via INTRAVENOUS

## 2017-11-01 MED ORDER — HEPARIN SOD (PORK) LOCK FLUSH 100 UNIT/ML IV SOLN
500.0000 [IU] | Freq: Once | INTRAVENOUS | Status: AC | PRN
  Administered 2017-11-01: 500 [IU]
  Filled 2017-11-01: qty 5

## 2017-11-01 MED ORDER — SODIUM CHLORIDE 0.9% FLUSH
10.0000 mL | INTRAVENOUS | Status: DC | PRN
  Administered 2017-11-01: 10 mL
  Filled 2017-11-01: qty 10

## 2017-11-01 MED ORDER — DIPHENHYDRAMINE HCL 50 MG/ML IJ SOLN
25.0000 mg | Freq: Once | INTRAMUSCULAR | Status: AC
  Administered 2017-11-01: 25 mg via INTRAVENOUS

## 2017-11-01 MED ORDER — DIPHENHYDRAMINE HCL 50 MG/ML IJ SOLN
INTRAMUSCULAR | Status: AC
  Filled 2017-11-01: qty 1

## 2017-11-01 MED ORDER — FAMOTIDINE IN NACL 20-0.9 MG/50ML-% IV SOLN
INTRAVENOUS | Status: AC
  Filled 2017-11-01: qty 50

## 2017-11-01 NOTE — Progress Notes (Signed)
[facility] Spiritual Care Note  Followed up with [name] in infusion, providing opportunity for her to process and reflect on her big achievements and grief/healing experiences within in the last week. Hearing them reflected back to her gave her a deeper sense of accomplishment and pride. She appears to be doing and coping well with the combination of her husband's death and being in active tx, balancing task management with feeling her feelings. Per pt, nights are more difficult than daytime, but she is still finding joy and connection with others. Following for support, but please page if immediate needs arise. Thank you.   [place], [place], [facility] Pager [phone] Voicemail [phone]

## 2017-11-01 NOTE — Patient Instructions (Signed)
[facility] Discharge Instructions for Patients Receiving Chemotherapy  Today you received the following chemotherapy agents:  Taxol.  To help prevent nausea and vomiting after your treatment, we encourage you to take your nausea medication as directed.   If you develop nausea and vomiting that is not controlled by your nausea medication, call the clinic.   BELOW ARE SYMPTOMS THAT SHOULD BE REPORTED IMMEDIATELY:  *FEVER GREATER THAN 100.5 F  *CHILLS WITH OR WITHOUT FEVER  NAUSEA AND VOMITING THAT IS NOT CONTROLLED WITH YOUR NAUSEA MEDICATION  *UNUSUAL SHORTNESS OF BREATH  *UNUSUAL BRUISING OR BLEEDING  TENDERNESS IN MOUTH AND THROAT WITH OR WITHOUT PRESENCE OF ULCERS  *URINARY PROBLEMS  *BOWEL PROBLEMS  UNUSUAL RASH Items with * indicate a potential emergency and should be followed up as soon as possible.  Feel free to call the clinic should you have any questions or concerns. The clinic phone number is [phone].  Please show the CHEMO ALERT CARD at check-in to the Emergency Department and triage nurse.   

## 2017-11-01 NOTE — Telephone Encounter (Signed)
No 10/15 los 

## 2017-11-01 NOTE — Progress Notes (Signed)
[facility] OFFICE PROGRESS NOTE   Diagnosis: Breast cancer  INTERVAL HISTORY:   [name] returns as scheduled.  She completed another cycle of Taxol 10/18/2017.  She has dry heaves after chemotherapy.  No mouth sores.  No diarrhea.  She had recent mild constipation.  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  Objective:  Vital signs in last 24 hours:  Blood pressure (!) 150/65, pulse 87, temperature 98.7 F (37.1 C), temperature source Oral, resp. rate 18, height 5' 2.5" (1.588 m), weight 197 lb 8 oz (89.6 kg), SpO2 96 %.    HEENT: No thrush or ulcers. Resp: Lungs clear bilaterally. Cardio: Regular rate and rhythm. GI: Abdomen soft and nontender.  No hepatomegaly. Vascular: No leg edema. Neuro: Vibratory sense intact over the fingertips per tuning fork exam.  Port-A-Cath without erythema.  Lab Results:  Lab Results  Component Value Date   WBC 5.0 11/01/2017   HGB 10.6 (L) 11/01/2017   HCT 33.8 (L) 11/01/2017   MCV 96.0 11/01/2017   PLT 188 11/01/2017   NEUTROABS 3.5 11/01/2017    Imaging:  No results found.  Medications: I have reviewed the patient's current medications.  Assessment/Plan: 1. 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 ER positive, PR positive, HER-2 negative She completed 4 cycles of adjuvant AC chemotherapy June 15 through September 03, 2008, and then completed the left chest wall radiation. She began Arimidex following an office visit on November 11, 2008.  Bone scan 06/01/2013 suggestive of thoracic spine metastases, thoracic MRI 06/27/2011 consistent with multiple bone metastases involving the thoracolumbar spine   PET scan 07/09/2013 with multiple hypermetabolic bone lesions and hypermetabolic mediastinal nodes.   Left iliac lesion biopsy 07/26/2013. Pathology showed metastatic  carcinoma, ER positive, PR positive, HER-2 negative   Initiation of Faslodex 08/10/2013.  Xgeva every 3 months initiated 07/27/2013.  Restaging PET scan 01/23/2014 with no residual hypermetabolic mediastinal disease; marked improvement in the metastatic bone disease.  Restaging PET scan 02/17/2015 with recurrence of skeletal metastasis. New hypermetabolic lesions within the pelvis and ribs. No hypermetabolic lymphadenopathy.  Continuation of Faslodex; initiation of Ibrance 03/05/2015  Cycle 2 Ibrance 04/09/2015-dose reduced to 100 mg daily  Cycle 3 Ibrance 05/08/2015  Cycle 4 Ibrance 06/11/2015  Cycle 5 Ibrance 07/15/2015  PET scan 08/12/2015-increase in size and increased metabolic activity associated with bone metastases, no new lesions  Ibrance/Faslodex discontinued  Tamoxifen started 08/07/2015  PET scan 02/04/2016 with new hypermetabolic liver lesions and numerous new skeletal lesions.  Xeloda, 7 days on/7 days off initiated February 2018  Xeloda dose reduced secondary to hand/foot syndrome beginning on 05/04/2016  Restaging MRI liver 05/18/2016-2 dominant hepatic metastases measuring up to 13 mm favored to be improved.  Xeloda continued  PET scan 11/12/2016-significant improvement with complete resolution of multiple hepatic hypermetabolic lesions and most of the prior bony hypermetabolic lesions. The remaining hypermetabolic hepatic and bony lesions are smaller and/or demonstrate significantly reduced activity.  Xeloda continued  MRI abdomen 05/16/2017-mixed response to therapy. 1 previously noted liver lesion in segment 3 completely resolved; other previously noted hepatic lesion increased significantly in size. 2 new liver lesions. Previously noted lesions in the lumbar spine similar.  Cycle 1 weekly Taxol 05/30/2017  Cycle 2 weekly Taxol 06/27/2017  Cycle 3 weekly Taxol 07/25/2017  MRI abdomen 08/17/2017-3 previously noted hepatic lesions appear stable to  slightly decreased in  size; one new hepatic lesion measuring 1.2 x 0.9 cm. Widespread metastatic disease to the bones redemonstrated.  Taxol continued every 2 weeks 2. Left chest subcutaneous mass noted on exam October 04, 2008, status post biopsy by Dr. Brantley Stage with a benign pathology. 3. History of delayed healing of the left mastectomy incision. 4. Rheumatoid arthritis. 5. Diabetes. 6. Hypercholesterolemia. 7. Hypertension. 8. Family history of breast cancer. She has been evaluated at the genetic screening clinic. 9. 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 10. 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 Hand/foot syndrome secondary to Xeloda-Xeloda dose reduced 05/04/2016  Disposition: [name] appears unchanged.  There is no clinical evidence of disease progression.  Plan to continue every 2-week Taxol.  She will return for lab, follow-up and chemotherapy in 2 weeks.  She will contact the office in the interim with any problems.    [name] ANP/GNP-BC   11/01/2017  11:12 AM

## 2017-11-13 ENCOUNTER — Other Ambulatory Visit: Payer: Self-pay | Admitting: Oncology

## 2017-11-15 ENCOUNTER — Inpatient Hospital Stay: Payer: Medicare Other

## 2017-11-15 ENCOUNTER — Inpatient Hospital Stay (HOSPITAL_BASED_OUTPATIENT_CLINIC_OR_DEPARTMENT_OTHER): Payer: Medicare Other | Admitting: Oncology

## 2017-11-15 ENCOUNTER — Other Ambulatory Visit: Payer: Self-pay | Admitting: *Deleted

## 2017-11-15 ENCOUNTER — Telehealth: Payer: Self-pay | Admitting: Oncology

## 2017-11-15 VITALS — BP 177/72 | HR 79

## 2017-11-15 VITALS — BP 184/109 | HR 80 | Temp 99.0°F | Resp 18 | Ht 62.5 in | Wt 205.3 lb

## 2017-11-15 DIAGNOSIS — C50919 Malignant neoplasm of unspecified site of unspecified female breast: Secondary | ICD-10-CM

## 2017-11-15 DIAGNOSIS — C7951 Secondary malignant neoplasm of bone: Secondary | ICD-10-CM | POA: Diagnosis not present

## 2017-11-15 DIAGNOSIS — Z95828 Presence of other vascular implants and grafts: Secondary | ICD-10-CM

## 2017-11-15 DIAGNOSIS — M069 Rheumatoid arthritis, unspecified: Secondary | ICD-10-CM

## 2017-11-15 DIAGNOSIS — G893 Neoplasm related pain (acute) (chronic): Secondary | ICD-10-CM | POA: Diagnosis not present

## 2017-11-15 DIAGNOSIS — Z5111 Encounter for antineoplastic chemotherapy: Secondary | ICD-10-CM | POA: Diagnosis not present

## 2017-11-15 DIAGNOSIS — I10 Essential (primary) hypertension: Secondary | ICD-10-CM | POA: Diagnosis not present

## 2017-11-15 DIAGNOSIS — E119 Type 2 diabetes mellitus without complications: Secondary | ICD-10-CM

## 2017-11-15 DIAGNOSIS — C50912 Malignant neoplasm of unspecified site of left female breast: Secondary | ICD-10-CM

## 2017-11-15 LAB — CMP (CANCER CENTER ONLY)
ALK PHOS: 196 U/L — AB (ref 38–126)
ALT: 9 U/L (ref 0–44)
AST: 19 U/L (ref 15–41)
Albumin: 3.1 g/dL — ABNORMAL LOW (ref 3.5–5.0)
Anion gap: 8 (ref 5–15)
BUN: 13 mg/dL (ref 8–23)
CALCIUM: 8.7 mg/dL — AB (ref 8.9–10.3)
CO2: 29 mmol/L (ref 22–32)
Chloride: 105 mmol/L (ref 98–111)
Creatinine: 0.91 mg/dL (ref 0.44–1.00)
GFR, Est AFR Am: >60 mL/min (ref >60)
GLUCOSE: 71 mg/dL (ref 70–99)
POTASSIUM: 3.6 mmol/L (ref 3.5–5.1)
SODIUM: 142 mmol/L (ref 135–145)
Total Bilirubin: 0.3 mg/dL (ref 0.3–1.2)
Total Protein: 6.4 g/dL — ABNORMAL LOW (ref 6.5–8.1)

## 2017-11-15 LAB — CBC WITH DIFFERENTIAL (CANCER CENTER ONLY)
ABS IMMATURE GRANULOCYTES: 0.02 10*3/uL (ref 0.00–0.07)
Basophils Absolute: 0 10*3/uL (ref 0.0–0.1)
Basophils Relative: 0 %
Eosinophils Absolute: 0.2 10*3/uL (ref 0.0–0.5)
Eosinophils Relative: 4 %
HEMATOCRIT: 31.8 % — AB (ref 36.0–46.0)
Hemoglobin: 9.9 g/dL — ABNORMAL LOW (ref 12.0–15.0)
IMMATURE GRANULOCYTES: 0 %
LYMPHS ABS: 0.6 10*3/uL — AB (ref 0.7–4.0)
Lymphocytes Relative: 11 %
MCH: 29.7 pg (ref 26.0–34.0)
MCHC: 31.1 g/dL (ref 30.0–36.0)
MCV: 95.5 fL (ref 80.0–100.0)
MONOS PCT: 10 %
Monocytes Absolute: 0.5 10*3/uL (ref 0.1–1.0)
NEUTROS ABS: 3.9 10*3/uL (ref 1.7–7.7)
NEUTROS PCT: 75 %
PLATELETS: 168 10*3/uL (ref 150–400)
RBC: 3.33 MIL/uL — ABNORMAL LOW (ref 3.87–5.11)
RDW: 15.1 % (ref 11.5–15.5)
WBC Count: 5.3 10*3/uL (ref 4.0–10.5)
nRBC: 0 % (ref 0.0–0.2)

## 2017-11-15 MED ORDER — OXYCODONE HCL ER 20 MG PO T12A: EXTENDED_RELEASE_TABLET | ORAL | 0 refills | Status: DC

## 2017-11-15 MED ORDER — DIPHENHYDRAMINE HCL 50 MG/ML IJ SOLN
25.0000 mg | Freq: Once | INTRAMUSCULAR | Status: AC
  Administered 2017-11-15: 25 mg via INTRAVENOUS

## 2017-11-15 MED ORDER — SODIUM CHLORIDE 0.9 % IV SOLN
Freq: Once | INTRAVENOUS | Status: AC
  Administered 2017-11-15: 14:00:00 via INTRAVENOUS
  Filled 2017-11-15: qty 250

## 2017-11-15 MED ORDER — FAMOTIDINE IN NACL 20-0.9 MG/50ML-% IV SOLN
20.0000 mg | Freq: Once | INTRAVENOUS | Status: AC
  Administered 2017-11-15: 20 mg via INTRAVENOUS

## 2017-11-15 MED ORDER — FAMOTIDINE IN NACL 20-0.9 MG/50ML-% IV SOLN
INTRAVENOUS | Status: AC
  Filled 2017-11-15: qty 50

## 2017-11-15 MED ORDER — DEXAMETHASONE SODIUM PHOSPHATE 10 MG/ML IJ SOLN
10.0000 mg | Freq: Once | INTRAMUSCULAR | Status: AC
  Administered 2017-11-15: 10 mg via INTRAVENOUS

## 2017-11-15 MED ORDER — DIPHENHYDRAMINE HCL 50 MG/ML IJ SOLN
INTRAMUSCULAR | Status: AC
  Filled 2017-11-15: qty 1

## 2017-11-15 MED ORDER — DIAZEPAM 5 MG PO TABS: 5.0000 mg | ORAL_TABLET | Freq: Three times a day (TID) | ORAL | 0 refills | Status: DC | PRN

## 2017-11-15 MED ORDER — HEPARIN SOD (PORK) LOCK FLUSH 100 UNIT/ML IV SOLN
500.0000 [IU] | Freq: Once | INTRAVENOUS | Status: AC | PRN
  Administered 2017-11-15: 500 [IU]
  Filled 2017-11-15: qty 5

## 2017-11-15 MED ORDER — OXYCODONE-ACETAMINOPHEN 10-325 MG PO TABS: 1.0000 | ORAL_TABLET | ORAL | 0 refills | Status: DC | PRN

## 2017-11-15 MED ORDER — SODIUM CHLORIDE 0.9% FLUSH
10.0000 mL | Freq: Once | INTRAVENOUS | Status: AC
  Administered 2017-11-15: 10 mL
  Filled 2017-11-15: qty 10

## 2017-11-15 MED ORDER — DEXAMETHASONE SODIUM PHOSPHATE 10 MG/ML IJ SOLN
INTRAMUSCULAR | Status: AC
  Filled 2017-11-15: qty 1

## 2017-11-15 MED ORDER — SODIUM CHLORIDE 0.9% FLUSH
10.0000 mL | INTRAVENOUS | Status: DC | PRN
  Administered 2017-11-15: 10 mL
  Filled 2017-11-15: qty 10

## 2017-11-15 MED ORDER — SODIUM CHLORIDE 0.9 % IV SOLN
80.0000 mg/m2 | Freq: Once | INTRAVENOUS | Status: AC
  Administered 2017-11-15: 162 mg via INTRAVENOUS
  Filled 2017-11-15: qty 27

## 2017-11-15 MED FILL — OXYCODONE-APAP 10-325: 10-325 | 30 days supply | Qty: 180 | Fill #0

## 2017-11-15 MED FILL — OxyCONTIN 20 MG T12A: 20 | 30 days supply | Qty: 90 | Fill #0

## 2017-11-15 MED FILL — diazePAM 5 MG TABS: 5 | 20 days supply | Qty: 60 | Fill #0

## 2017-11-15 NOTE — Patient Instructions (Signed)
[facility] Discharge Instructions for Patients Receiving Chemotherapy  Today you received the following chemotherapy agents: Paclitaxel (Taxol)  To help prevent nausea and vomiting after your treatment, we encourage you to take your nausea medication as prescribed. If you develop nausea and vomiting that is not controlled by your nausea medication, call the clinic.   BELOW ARE SYMPTOMS THAT SHOULD BE REPORTED IMMEDIATELY:  *FEVER GREATER THAN 100.5 F  *CHILLS WITH OR WITHOUT FEVER  NAUSEA AND VOMITING THAT IS NOT CONTROLLED WITH YOUR NAUSEA MEDICATION  *UNUSUAL SHORTNESS OF BREATH  *UNUSUAL BRUISING OR BLEEDING  TENDERNESS IN MOUTH AND THROAT WITH OR WITHOUT PRESENCE OF ULCERS  *URINARY PROBLEMS  *BOWEL PROBLEMS  UNUSUAL RASH Items with * indicate a potential emergency and should be followed up as soon as possible.  Feel free to call the clinic should you have any questions or concerns. The clinic phone number is [phone].  Please show the CHEMO ALERT CARD at check-in to the Emergency Department and triage nurse.   

## 2017-11-15 NOTE — Progress Notes (Signed)
[name] OFFICE PROGRESS NOTE   Diagnosis: Breast cancer  INTERVAL HISTORY:   [name] returns as scheduled.  She completed another treatment with Taxol on 11/01/2017.  No progressive neuropathy symptoms.  She has persistent bone pain that she relates to cancer.  She continues twice daily OxyContin in addition to breakthrough oxycodone.  Objective:  Vital signs in last 24 hours:  Blood pressure (!) 184/109, pulse 80, temperature 99 F (37.2 C), temperature source Oral, resp. rate 18, height 5' 2.5" (1.588 m), weight 205 lb 4.8 oz (93.1 kg), SpO2 96 %.    Resp: Lungs clear bilaterally Cardio: Regular rate and rhythm GI: No hepatomegaly Vascular: No leg edema   Portacath/PICC-without erythema  Lab Results:  Lab Results  Component Value Date   WBC 5.3 11/15/2017   HGB 9.9 (L) 11/15/2017   HCT 31.8 (L) 11/15/2017   MCV 95.5 11/15/2017   PLT 168 11/15/2017   NEUTROABS 3.9 11/15/2017    CMP  Lab Results  Component Value Date   NA 142 11/15/2017   K 3.6 11/15/2017   CL 105 11/15/2017   CO2 29 11/15/2017   GLUCOSE 71 11/15/2017   BUN 13 11/15/2017   CREATININE 0.91 11/15/2017   CALCIUM 8.7 (L) 11/15/2017   PROT 6.4 (L) 11/15/2017   ALBUMIN 3.1 (L) 11/15/2017   AST 19 11/15/2017   ALT 9 11/15/2017   ALKPHOS 196 (H) 11/15/2017   BILITOT 0.3 11/15/2017   GFRNONAA >60 11/15/2017   GFRAA >60 11/15/2017     Medications: I have reviewed the patient's current medications.   Assessment/Plan: 1. 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 ER positive, PR positive, HER-2 negative She completed 4 cycles of adjuvant AC chemotherapy June 15 through September 03, 2008, and then completed the left chest wall radiation. She began Arimidex following an office visit on November 11, 2008.  Bone scan 06/01/2013 suggestive of  thoracic spine metastases, thoracic MRI 06/27/2011 consistent with multiple bone metastases involving the thoracolumbar spine   PET scan 07/09/2013 with multiple hypermetabolic bone lesions and hypermetabolic mediastinal nodes.   Left iliac lesion biopsy 07/26/2013. Pathology showed metastatic carcinoma, ER positive, PR positive, HER-2 negative   Initiation of Faslodex 08/10/2013.  Xgeva every 3 months initiated 07/27/2013.  Restaging PET scan 01/23/2014 with no residual hypermetabolic mediastinal disease; marked improvement in the metastatic bone disease.  Restaging PET scan 02/17/2015 with recurrence of skeletal metastasis. New hypermetabolic lesions within the pelvis and ribs. No hypermetabolic lymphadenopathy.  Continuation of Faslodex; initiation of Ibrance 03/05/2015  Cycle 2 Ibrance 04/09/2015-dose reduced to 100 mg daily  Cycle 3 Ibrance 05/08/2015  Cycle 4 Ibrance 06/11/2015  Cycle 5 Ibrance 07/15/2015  PET scan 08/12/2015-increase in size and increased metabolic activity associated with bone metastases, no new lesions  Ibrance/Faslodex discontinued  Tamoxifen started 08/07/2015  PET scan 02/04/2016 with new hypermetabolic liver lesions and numerous new skeletal lesions.  Xeloda, 7 days on/7 days off initiated February 2018  Xeloda dose reduced secondary to hand/foot syndrome beginning on 05/04/2016  Restaging MRI liver 05/18/2016-2 dominant hepatic metastases measuring up to 13 mm favored to be improved.  Xeloda continued  PET scan 11/12/2016-significant improvement with complete resolution of multiple hepatic hypermetabolic lesions and most of the prior bony hypermetabolic lesions. The remaining hypermetabolic hepatic and bony lesions are smaller and/or demonstrate significantly reduced activity.  Xeloda continued  MRI abdomen 05/16/2017-mixed  response to therapy. 1 previously noted liver lesion in segment 3 completely resolved; other previously noted hepatic  lesion increased significantly in size. 2 new liver lesions. Previously noted lesions in the lumbar spine similar.  Cycle 1 weekly Taxol 05/30/2017  Cycle 2 weekly Taxol 06/27/2017  Cycle 3 weekly Taxol 07/25/2017  MRI abdomen7/31/2019-3 previously noted hepatic lesions appear stable to slightly decreased in size; one new hepatic lesion measuring 1.2 x 0.9 cm. Widespread metastatic disease to the bones redemonstrated.  Taxol continued every 2 weeks 2. Left chest subcutaneous mass noted on exam October 04, 2008, status post biopsy by Dr. Brantley Stage with a benign pathology. 3. History of delayed healing of the left mastectomy incision. 4. Rheumatoid arthritis. 5. Diabetes. 6. Hypercholesterolemia. 7. Hypertension. 8. Family history of breast cancer. She has been evaluated at the genetic screening clinic. 9. 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 10. 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 Hand/foot syndrome secondary to Xeloda-Xeloda dose reduced 05/04/2016   Disposition: [name] appears unchanged.  She will continue every 2-week Taxol.  She will be scheduled for a restaging liver MRI prior to a return office visit in 4 weeks.  She will continue the current narcotic regimen.  15 minutes were spent with the patient today.  The majority of the time was used for counseling and coordination of care.  Betsy Coder, MD  11/15/2017  1:06 PM

## 2017-11-15 NOTE — Telephone Encounter (Signed)
Gave pt avs and calendar  °

## 2017-11-16 LAB — CANCER ANTIGEN 27.29: CA 27.29: 94 U/mL — ABNORMAL HIGH (ref 0.0–38.6)

## 2017-11-26 ENCOUNTER — Other Ambulatory Visit: Payer: Self-pay | Admitting: Oncology

## 2017-11-29 ENCOUNTER — Inpatient Hospital Stay: Payer: Medicare Other

## 2017-11-29 ENCOUNTER — Inpatient Hospital Stay: Payer: Medicare Other | Attending: Oncology

## 2017-11-29 ENCOUNTER — Ambulatory Visit (HOSPITAL_COMMUNITY)
Admission: RE | Admit: 2017-11-29 | Discharge: 2017-11-29 | Disposition: A | Discharge status: Home / Self Care | Payer: Medicare Other | Source: Ambulatory Visit | Attending: Oncology | Admitting: Oncology

## 2017-11-29 ENCOUNTER — Inpatient Hospital Stay (HOSPITAL_BASED_OUTPATIENT_CLINIC_OR_DEPARTMENT_OTHER): Payer: Medicare Other | Admitting: Oncology

## 2017-11-29 VITALS — BP 185/88 | HR 92 | Temp 98.4°F | Resp 18 | Ht 62.5 in | Wt 191.0 lb

## 2017-11-29 DIAGNOSIS — E119 Type 2 diabetes mellitus without complications: Secondary | ICD-10-CM | POA: Insufficient documentation

## 2017-11-29 DIAGNOSIS — C50912 Malignant neoplasm of unspecified site of left female breast: Secondary | ICD-10-CM | POA: Diagnosis present

## 2017-11-29 DIAGNOSIS — C50919 Malignant neoplasm of unspecified site of unspecified female breast: Secondary | ICD-10-CM | POA: Diagnosis not present

## 2017-11-29 DIAGNOSIS — I10 Essential (primary) hypertension: Secondary | ICD-10-CM | POA: Insufficient documentation

## 2017-11-29 DIAGNOSIS — R911 Solitary pulmonary nodule: Secondary | ICD-10-CM | POA: Insufficient documentation

## 2017-11-29 DIAGNOSIS — C7951 Secondary malignant neoplasm of bone: Secondary | ICD-10-CM | POA: Insufficient documentation

## 2017-11-29 DIAGNOSIS — Z170 Estrogen receptor positive status [ER+]: Secondary | ICD-10-CM | POA: Insufficient documentation

## 2017-11-29 DIAGNOSIS — M069 Rheumatoid arthritis, unspecified: Secondary | ICD-10-CM | POA: Diagnosis not present

## 2017-11-29 DIAGNOSIS — Z95828 Presence of other vascular implants and grafts: Secondary | ICD-10-CM

## 2017-11-29 DIAGNOSIS — R51 Headache: Secondary | ICD-10-CM | POA: Diagnosis not present

## 2017-11-29 DIAGNOSIS — C787 Secondary malignant neoplasm of liver and intrahepatic bile duct: Secondary | ICD-10-CM | POA: Insufficient documentation

## 2017-11-29 DIAGNOSIS — Z5111 Encounter for antineoplastic chemotherapy: Secondary | ICD-10-CM | POA: Diagnosis not present

## 2017-11-29 LAB — CBC WITH DIFFERENTIAL (CANCER CENTER ONLY)
Abs Immature Granulocytes: 0.02 10*3/uL (ref 0.00–0.07)
BASOS PCT: 0 %
Basophils Absolute: 0 10*3/uL (ref 0.0–0.1)
Eosinophils Absolute: 0.1 10*3/uL (ref 0.0–0.5)
Eosinophils Relative: 3 %
HCT: 33 % — ABNORMAL LOW (ref 36.0–46.0)
HEMOGLOBIN: 10.3 g/dL — AB (ref 12.0–15.0)
IMMATURE GRANULOCYTES: 0 %
Lymphocytes Relative: 10 %
Lymphs Abs: 0.4 10*3/uL — ABNORMAL LOW (ref 0.7–4.0)
MCH: 29.8 pg (ref 26.0–34.0)
MCHC: 31.2 g/dL (ref 30.0–36.0)
MCV: 95.4 fL (ref 80.0–100.0)
MONO ABS: 0.6 10*3/uL (ref 0.1–1.0)
MONOS PCT: 12 %
NEUTROS ABS: 3.5 10*3/uL (ref 1.7–7.7)
NEUTROS PCT: 75 %
PLATELETS: 169 10*3/uL (ref 150–400)
RBC: 3.46 MIL/uL — ABNORMAL LOW (ref 3.87–5.11)
RDW: 15.3 % (ref 11.5–15.5)
WBC Count: 4.6 10*3/uL (ref 4.0–10.5)
nRBC: 0 % (ref 0.0–0.2)

## 2017-11-29 LAB — CMP (CANCER CENTER ONLY)
ALK PHOS: 139 U/L — AB (ref 38–126)
ALT: 7 U/L (ref 0–44)
AST: 16 U/L (ref 15–41)
Albumin: 3.5 g/dL (ref 3.5–5.0)
Anion gap: 11 (ref 5–15)
BUN: 14 mg/dL (ref 8–23)
CALCIUM: 9.3 mg/dL (ref 8.9–10.3)
CO2: 32 mmol/L (ref 22–32)
CREATININE: 1.13 mg/dL — AB (ref 0.44–1.00)
Chloride: 99 mmol/L (ref 98–111)
GFR, Est AFR Am: 57 mL/min — ABNORMAL LOW (ref >60)
GFR, Estimated: 50 mL/min — ABNORMAL LOW (ref >60)
GLUCOSE: 107 mg/dL — AB (ref 70–99)
Potassium: 3.3 mmol/L — ABNORMAL LOW (ref 3.5–5.1)
SODIUM: 142 mmol/L (ref 135–145)
TOTAL PROTEIN: 6.9 g/dL (ref 6.5–8.1)
Total Bilirubin: 0.4 mg/dL (ref 0.3–1.2)

## 2017-11-29 MED ORDER — DIPHENHYDRAMINE HCL 50 MG/ML IJ SOLN
INTRAMUSCULAR | Status: AC
  Filled 2017-11-29: qty 1

## 2017-11-29 MED ORDER — FAMOTIDINE IN NACL 20-0.9 MG/50ML-% IV SOLN: 20.0000 mg | Freq: Once | INTRAVENOUS | Status: DC

## 2017-11-29 MED ORDER — SODIUM CHLORIDE 0.9% FLUSH
10.0000 mL | INTRAVENOUS | Status: DC | PRN
  Administered 2017-11-29: 10 mL
  Filled 2017-11-29: qty 10

## 2017-11-29 MED ORDER — DEXAMETHASONE SODIUM PHOSPHATE 10 MG/ML IJ SOLN
10.0000 mg | Freq: Once | INTRAMUSCULAR | Status: AC
  Administered 2017-11-29: 10 mg via INTRAVENOUS

## 2017-11-29 MED ORDER — DEXAMETHASONE SODIUM PHOSPHATE 10 MG/ML IJ SOLN
INTRAMUSCULAR | Status: AC
  Filled 2017-11-29: qty 1

## 2017-11-29 MED ORDER — SODIUM CHLORIDE 0.9 % IV SOLN
80.0000 mg/m2 | Freq: Once | INTRAVENOUS | Status: AC
  Administered 2017-11-29: 162 mg via INTRAVENOUS
  Filled 2017-11-29: qty 27

## 2017-11-29 MED ORDER — SODIUM CHLORIDE 0.9% FLUSH
10.0000 mL | Freq: Once | INTRAVENOUS | Status: AC
  Administered 2017-11-29: 10 mL
  Filled 2017-11-29: qty 10

## 2017-11-29 MED ORDER — SODIUM CHLORIDE 0.9 % IV SOLN
Freq: Once | INTRAVENOUS | Status: AC
  Administered 2017-11-29: 10:00:00 via INTRAVENOUS
  Filled 2017-11-29: qty 250

## 2017-11-29 MED ORDER — SODIUM CHLORIDE 0.9 % IV SOLN
20.0000 mg | Freq: Once | INTRAVENOUS | Status: AC
  Administered 2017-11-29: 20 mg via INTRAVENOUS
  Filled 2017-11-29: qty 2

## 2017-11-29 MED ORDER — DIPHENHYDRAMINE HCL 50 MG/ML IJ SOLN
25.0000 mg | Freq: Once | INTRAMUSCULAR | Status: AC
  Administered 2017-11-29: 25 mg via INTRAVENOUS

## 2017-11-29 MED ORDER — HEPARIN SOD (PORK) LOCK FLUSH 100 UNIT/ML IV SOLN
500.0000 [IU] | Freq: Once | INTRAVENOUS | Status: AC | PRN
  Administered 2017-11-29: 500 [IU]
  Filled 2017-11-29: qty 5

## 2017-11-29 NOTE — Patient Instructions (Signed)
[facility] Discharge Instructions for Patients Receiving Chemotherapy  Today you received the following chemotherapy agents: Paclitaxel (Taxol).  To help prevent nausea and vomiting after your treatment, we encourage you to take your nausea medication as directed.    If you develop nausea and vomiting that is not controlled by your nausea medication, call the clinic.   BELOW ARE SYMPTOMS THAT SHOULD BE REPORTED IMMEDIATELY:  *FEVER GREATER THAN 100.5 F  *CHILLS WITH OR WITHOUT FEVER  NAUSEA AND VOMITING THAT IS NOT CONTROLLED WITH YOUR NAUSEA MEDICATION  *UNUSUAL SHORTNESS OF BREATH  *UNUSUAL BRUISING OR BLEEDING  TENDERNESS IN MOUTH AND THROAT WITH OR WITHOUT PRESENCE OF ULCERS  *URINARY PROBLEMS  *BOWEL PROBLEMS  UNUSUAL RASH Items with * indicate a potential emergency and should be followed up as soon as possible.  Feel free to call the clinic should you have any questions or concerns. The clinic phone number is [phone].  Please show the CHEMO ALERT CARD at check-in to the Emergency Department and triage nurse.   

## 2017-11-29 NOTE — Progress Notes (Signed)
[facility] OFFICE PROGRESS NOTE   Diagnosis: Breast cancer  INTERVAL HISTORY:   [name] returns as scheduled.  She completed another treatment with Taxol on 11/15/2017.  She reports stable bone pain.  She has new intermittent discomfort of the right side of the skull and right face.  No rash.  She has swelling and discomfort at the right lower leg. She relates weight loss to decreased lower extremity edema.  She has a good appetite.  Objective:  Vital signs in last 24 hours:  Blood pressure (!) 185/88, pulse 92, temperature 98.4 F (36.9 C), temperature source Oral, resp. rate 18, height 5' 2.5" (1.588 m), weight 191 lb (86.6 kg), SpO2 94 %.    HEENT: No thrush. Resp: Lungs clear bilaterally Cardio: Regular rate and rhythm GI: No hepatomegaly, nontender Vascular: Edema and mild erythema at the right leg below the knee Skin: No rash over the scalp or face  Portacath/PICC-without erythema  Lab Results:  Lab Results  Component Value Date   WBC 4.6 11/29/2017   HGB 10.3 (L) 11/29/2017   HCT 33.0 (L) 11/29/2017   MCV 95.4 11/29/2017   PLT 169 11/29/2017   NEUTROABS 3.5 11/29/2017    CMP  Lab Results  Component Value Date   NA 142 11/29/2017   K 3.3 (L) 11/29/2017   CL 99 11/29/2017   CO2 32 11/29/2017   GLUCOSE 107 (H) 11/29/2017   BUN 14 11/29/2017   CREATININE 1.13 (H) 11/29/2017   CALCIUM 9.3 11/29/2017   PROT 6.9 11/29/2017   ALBUMIN 3.5 11/29/2017   AST 16 11/29/2017   ALT 7 11/29/2017   ALKPHOS 139 (H) 11/29/2017   BILITOT 0.4 11/29/2017   GFRNONAA 50 (L) 11/29/2017   GFRAA 57 (L) 11/29/2017     Medications: I have reviewed the patient's current medications.   Assessment/Plan: 1. 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 ER positive, PR positive, HER-2 negative She completed 4 cycles of adjuvant AC  chemotherapy June 15 through September 03, 2008, and then completed the left chest wall radiation. She began Arimidex following an office visit on November 11, 2008.  Bone scan 06/01/2013 suggestive of thoracic spine metastases, thoracic MRI 06/27/2011 consistent with multiple bone metastases involving the thoracolumbar spine   PET scan 07/09/2013 with multiple hypermetabolic bone lesions and hypermetabolic mediastinal nodes.   Left iliac lesion biopsy 07/26/2013. Pathology showed metastatic carcinoma, ER positive, PR positive, HER-2 negative   Initiation of Faslodex 08/10/2013.  Xgeva every 3 months initiated 07/27/2013.  Restaging PET scan 01/23/2014 with no residual hypermetabolic mediastinal disease; marked improvement in the metastatic bone disease.  Restaging PET scan 02/17/2015 with recurrence of skeletal metastasis. New hypermetabolic lesions within the pelvis and ribs. No hypermetabolic lymphadenopathy.  Continuation of Faslodex; initiation of Ibrance 03/05/2015  Cycle 2 Ibrance 04/09/2015-dose reduced to 100 mg daily  Cycle 3 Ibrance 05/08/2015  Cycle 4 Ibrance 06/11/2015  Cycle 5 Ibrance 07/15/2015  PET scan 08/12/2015-increase in size and increased metabolic activity associated with bone metastases, no new lesions  Ibrance/Faslodex discontinued  Tamoxifen started 08/07/2015  PET scan 02/04/2016 with new hypermetabolic liver lesions and numerous new skeletal lesions.  Xeloda, 7 days on/7 days off initiated February 2018  Xeloda dose reduced secondary to hand/foot syndrome beginning on 05/04/2016  Restaging MRI liver 05/18/2016-2 dominant hepatic metastases measuring up to 13 mm favored to be improved.  Xeloda  continued  PET scan 11/12/2016-significant improvement with complete resolution of multiple hepatic hypermetabolic lesions and most of the prior bony hypermetabolic lesions. The remaining hypermetabolic hepatic and bony lesions are smaller and/or demonstrate  significantly reduced activity.  Xeloda continued  MRI abdomen 05/16/2017-mixed response to therapy. 1 previously noted liver lesion in segment 3 completely resolved; other previously noted hepatic lesion increased significantly in size. 2 new liver lesions. Previously noted lesions in the lumbar spine similar.  Cycle 1 weekly Taxol 05/30/2017  Cycle 2 weekly Taxol 06/27/2017  Cycle 3 weekly Taxol 07/25/2017  MRI abdomen7/31/2019-3 previously noted hepatic lesions appear stable to slightly decreased in size; one new hepatic lesion measuring 1.2 x 0.9 cm. Widespread metastatic disease to the bones redemonstrated.  Taxol continued every 2 weeks 2. Left chest subcutaneous mass noted on exam October 04, 2008, status post biopsy by Dr. Brantley Stage with a benign pathology. 3. History of delayed healing of the left mastectomy incision. 4. Rheumatoid arthritis. 5. Diabetes. 6. Hypercholesterolemia. 7. Hypertension. 8. Family history of breast cancer. She has been evaluated at the genetic screening clinic. 9. 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 10. 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 Hand/foot syndrome secondary to Xeloda-Xeloda dose reduced 05/04/2016  Disposition: [name] continues every 2-week Taxol.  There is no clinical evidence of disease progression.  She will complete another treatment today.  She will undergo a restaging liver MRI prior to an office visit in 2 weeks.  She has discomfort and swelling at the right lower leg today.  She will be referred for a Doppler today.  We will place her on anticoagulation therapy as indicated.  25 minutes were spent with the patient today.   The majority of the time was used for counseling and coordination of care.  Betsy Coder, MD  11/29/2017  9:51 AM

## 2017-11-29 NOTE — Progress Notes (Signed)
Right lower extremity venous duplex has been completed. Negative for DVT. Results were given to [place] at Dr. Gearldine Shown office.  11/29/17 1:56 PM Carlos Levering RVT

## 2017-11-29 NOTE — Progress Notes (Signed)
Reports pain/ache right side of head that radiates to her right side of face and down her throat-intermittent

## 2017-11-30 LAB — CANCER ANTIGEN 27.29: CAN 27.29: 109.7 U/mL — AB (ref 0.0–38.6)

## 2017-12-09 ENCOUNTER — Ambulatory Visit (HOSPITAL_COMMUNITY)
Admission: RE | Admit: 2017-12-09 | Discharge: 2017-12-09 | Disposition: A | Discharge status: Home / Self Care | Payer: Medicare Other | Source: Ambulatory Visit | Attending: Oncology | Admitting: Oncology

## 2017-12-09 DIAGNOSIS — C7951 Secondary malignant neoplasm of bone: Secondary | ICD-10-CM | POA: Diagnosis not present

## 2017-12-09 DIAGNOSIS — C787 Secondary malignant neoplasm of liver and intrahepatic bile duct: Secondary | ICD-10-CM | POA: Insufficient documentation

## 2017-12-09 DIAGNOSIS — C50912 Malignant neoplasm of unspecified site of left female breast: Secondary | ICD-10-CM | POA: Insufficient documentation

## 2017-12-09 MED ORDER — GADOBUTROL 1 MMOL/ML IV SOLN
8.0000 mL | Freq: Once | INTRAVENOUS | Status: AC | PRN
  Administered 2017-12-09: 8 mL via INTRAVENOUS

## 2017-12-09 MED ORDER — HEPARIN SOD (PORK) LOCK FLUSH 100 UNIT/ML IV SOLN
INTRAVENOUS | Status: AC
  Filled 2017-12-09: qty 5

## 2017-12-11 ENCOUNTER — Other Ambulatory Visit: Payer: Self-pay | Admitting: Oncology

## 2017-12-13 ENCOUNTER — Encounter: Payer: Self-pay | Admitting: Nurse Practitioner

## 2017-12-13 ENCOUNTER — Inpatient Hospital Stay: Payer: Medicare Other

## 2017-12-13 ENCOUNTER — Inpatient Hospital Stay (HOSPITAL_BASED_OUTPATIENT_CLINIC_OR_DEPARTMENT_OTHER): Payer: Medicare Other | Admitting: Nurse Practitioner

## 2017-12-13 VITALS — BP 166/90 | HR 92 | Temp 98.3°F | Resp 18 | Ht 62.5 in | Wt 186.8 lb

## 2017-12-13 DIAGNOSIS — C50912 Malignant neoplasm of unspecified site of left female breast: Secondary | ICD-10-CM

## 2017-12-13 DIAGNOSIS — Z95828 Presence of other vascular implants and grafts: Secondary | ICD-10-CM

## 2017-12-13 DIAGNOSIS — I10 Essential (primary) hypertension: Secondary | ICD-10-CM

## 2017-12-13 DIAGNOSIS — C787 Secondary malignant neoplasm of liver and intrahepatic bile duct: Secondary | ICD-10-CM

## 2017-12-13 DIAGNOSIS — C7951 Secondary malignant neoplasm of bone: Secondary | ICD-10-CM

## 2017-12-13 DIAGNOSIS — Z5111 Encounter for antineoplastic chemotherapy: Secondary | ICD-10-CM | POA: Diagnosis not present

## 2017-12-13 DIAGNOSIS — M069 Rheumatoid arthritis, unspecified: Secondary | ICD-10-CM

## 2017-12-13 DIAGNOSIS — R911 Solitary pulmonary nodule: Secondary | ICD-10-CM

## 2017-12-13 DIAGNOSIS — R51 Headache: Secondary | ICD-10-CM

## 2017-12-13 DIAGNOSIS — E119 Type 2 diabetes mellitus without complications: Secondary | ICD-10-CM

## 2017-12-13 DIAGNOSIS — C50919 Malignant neoplasm of unspecified site of unspecified female breast: Secondary | ICD-10-CM

## 2017-12-13 LAB — CMP (CANCER CENTER ONLY)
ALT: 7 U/L (ref 0–44)
AST: 19 U/L (ref 15–41)
Albumin: 3.4 g/dL — ABNORMAL LOW (ref 3.5–5.0)
Alkaline Phosphatase: 169 U/L — ABNORMAL HIGH (ref 38–126)
Anion gap: 13 (ref 5–15)
BILIRUBIN TOTAL: 0.3 mg/dL (ref 0.3–1.2)
BUN: 19 mg/dL (ref 8–23)
CO2: 25 mmol/L (ref 22–32)
CREATININE: 1.14 mg/dL — AB (ref 0.44–1.00)
Calcium: 9.4 mg/dL (ref 8.9–10.3)
Chloride: 104 mmol/L (ref 98–111)
GFR, Est AFR Am: 58 mL/min — ABNORMAL LOW (ref >60)
GFR, Estimated: 50 mL/min — ABNORMAL LOW (ref >60)
GLUCOSE: 101 mg/dL — AB (ref 70–99)
POTASSIUM: 3.5 mmol/L (ref 3.5–5.1)
Sodium: 142 mmol/L (ref 135–145)
TOTAL PROTEIN: 6.9 g/dL (ref 6.5–8.1)

## 2017-12-13 LAB — CBC WITH DIFFERENTIAL (CANCER CENTER ONLY)
ABS IMMATURE GRANULOCYTES: 0.01 10*3/uL (ref 0.00–0.07)
BASOS PCT: 0 %
Basophils Absolute: 0 10*3/uL (ref 0.0–0.1)
Eosinophils Absolute: 0.2 10*3/uL (ref 0.0–0.5)
Eosinophils Relative: 3 %
HEMATOCRIT: 34.4 % — AB (ref 36.0–46.0)
HEMOGLOBIN: 10.9 g/dL — AB (ref 12.0–15.0)
Immature Granulocytes: 0 %
LYMPHS PCT: 9 %
Lymphs Abs: 0.5 10*3/uL — ABNORMAL LOW (ref 0.7–4.0)
MCH: 29.2 pg (ref 26.0–34.0)
MCHC: 31.7 g/dL (ref 30.0–36.0)
MCV: 92.2 fL (ref 80.0–100.0)
MONO ABS: 0.5 10*3/uL (ref 0.1–1.0)
MONOS PCT: 9 %
NEUTROS ABS: 4.6 10*3/uL (ref 1.7–7.7)
Neutrophils Relative %: 79 %
PLATELETS: 178 10*3/uL (ref 150–400)
RBC: 3.73 MIL/uL — ABNORMAL LOW (ref 3.87–5.11)
RDW: 15.6 % — AB (ref 11.5–15.5)
WBC Count: 5.7 10*3/uL (ref 4.0–10.5)
nRBC: 0 % (ref 0.0–0.2)

## 2017-12-13 MED ORDER — OXYCODONE-ACETAMINOPHEN 10-325 MG PO TABS: 1.0000 | ORAL_TABLET | ORAL | 0 refills | Status: DC | PRN

## 2017-12-13 MED ORDER — DIAZEPAM 5 MG PO TABS: 5.0000 mg | ORAL_TABLET | Freq: Three times a day (TID) | ORAL | 0 refills | Status: DC | PRN

## 2017-12-13 MED ORDER — HEPARIN SOD (PORK) LOCK FLUSH 100 UNIT/ML IV SOLN
500.0000 [IU] | Freq: Once | INTRAVENOUS | Status: AC
  Administered 2017-12-13: 500 [IU]
  Filled 2017-12-13: qty 5

## 2017-12-13 MED ORDER — OXYCODONE HCL ER 20 MG PO T12A: EXTENDED_RELEASE_TABLET | ORAL | 0 refills | Status: DC

## 2017-12-13 MED ORDER — SODIUM CHLORIDE 0.9% FLUSH
10.0000 mL | Freq: Once | INTRAVENOUS | Status: AC
  Administered 2017-12-13: 10 mL
  Filled 2017-12-13: qty 10

## 2017-12-13 MED FILL — OXYCODONE-APAP 10-325: 10-325 | 30 days supply | Qty: 180 | Fill #0

## 2017-12-13 MED FILL — diazePAM 5 MG TABS: 5 | 20 days supply | Qty: 60 | Fill #0

## 2017-12-13 MED FILL — OxyCONTIN 20 MG T12A: 20 | 30 days supply | Qty: 90 | Fill #0

## 2017-12-13 NOTE — Progress Notes (Addendum)
[facility] OFFICE PROGRESS NOTE   Diagnosis: Breast cancer  INTERVAL HISTORY:   [name] returns as scheduled.  She completed another cycle of Taxol 11/29/2017.  She has intermittent dry heaves.  She thinks she may have a small sore along the right tongue.  No significant diarrhea or constipation.  Stable neuropathy symptoms.  Leg swelling has resolved.  She continues to note right facial swelling/pain.  She is fatigued.  Objective:  Vital signs in last 24 hours:  Blood pressure (!) 166/100, pulse 92, temperature 98.3 F (36.8 C), temperature source Oral, resp. rate 18, height 5' 2.5" (1.588 m), weight 186 lb 12.8 oz (84.7 kg), SpO2 97 %.    HEENT: Right periorbital/facial edema.  No rash.  Extraocular movements intact.  Pupils equal round reactive to light. Lymphatics: No palpable cervical or supraclavicular lymph nodes. Resp: Lungs clear bilaterally. Cardio: Regular rate and rhythm. GI: Abdomen soft and nontender.  No hepatomegaly. Vascular: No leg edema. Port-A-Cath without erythema.  Lab Results:  Lab Results  Component Value Date   WBC 5.7 12/13/2017   HGB 10.9 (L) 12/13/2017   HCT 34.4 (L) 12/13/2017   MCV 92.2 12/13/2017   PLT 178 12/13/2017   NEUTROABS 4.6 12/13/2017    Imaging:  No results found.  Medications: I have reviewed the patient's current medications.  Assessment/Plan: 1. 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 ER positive, PR positive, HER-2 negative She completed 4 cycles of adjuvant AC chemotherapy June 15 through September 03, 2008, and then completed the left chest wall radiation. She began Arimidex following an office visit on November 11, 2008.  Bone scan 06/01/2013 suggestive of thoracic spine metastases, thoracic MRI 06/27/2011 consistent with multiple bone metastases involving the  thoracolumbar spine   PET scan 07/09/2013 with multiple hypermetabolic bone lesions and hypermetabolic mediastinal nodes.   Left iliac lesion biopsy 07/26/2013. Pathology showed metastatic carcinoma, ER positive, PR positive, HER-2 negative   Initiation of Faslodex 08/10/2013.  Xgeva every 3 months initiated 07/27/2013.  Restaging PET scan 01/23/2014 with no residual hypermetabolic mediastinal disease; marked improvement in the metastatic bone disease.  Restaging PET scan 02/17/2015 with recurrence of skeletal metastasis. New hypermetabolic lesions within the pelvis and ribs. No hypermetabolic lymphadenopathy.  Continuation of Faslodex; initiation of Ibrance 03/05/2015  Cycle 2 Ibrance 04/09/2015-dose reduced to 100 mg daily  Cycle 3 Ibrance 05/08/2015  Cycle 4 Ibrance 06/11/2015  Cycle 5 Ibrance 07/15/2015  PET scan 08/12/2015-increase in size and increased metabolic activity associated with bone metastases, no new lesions  Ibrance/Faslodex discontinued  Tamoxifen started 08/07/2015  PET scan 02/04/2016 with new hypermetabolic liver lesions and numerous new skeletal lesions.  Xeloda, 7 days on/7 days off initiated February 2018  Xeloda dose reduced secondary to hand/foot syndrome beginning on 05/04/2016  Restaging MRI liver 05/18/2016-2 dominant hepatic metastases measuring up to 13 mm favored to be improved.  Xeloda continued  PET scan 11/12/2016-significant improvement with complete resolution of multiple hepatic hypermetabolic lesions and most of the prior bony hypermetabolic lesions. The remaining hypermetabolic hepatic and bony lesions are smaller and/or demonstrate significantly reduced activity.  Xeloda continued  MRI abdomen 05/16/2017-mixed response to therapy. 1 previously noted liver lesion in segment 3 completely resolved; other previously noted hepatic lesion increased significantly in size. 2 new liver lesions. Previously noted lesions in the lumbar  spine similar.  Cycle 1 weekly Taxol 05/30/2017  Cycle  2 weekly Taxol 06/27/2017  Cycle 3 weekly Taxol 07/25/2017  MRI abdomen7/31/2019-3 previously noted hepatic lesions appear stable to slightly decreased in size; one new hepatic lesion measuring 1.2 x 0.9 cm. Widespread metastatic disease to the bones redemonstrated.  Taxol continued every 2 weeks  MRI abdomen 12/09/2017- liver metastases increased in size and number.  Several new pulmonary nodules at the lung bases bilaterally.  Widespread bone metastases appear stable. 2. Left chest subcutaneous mass noted on exam October 04, 2008, status post biopsy by Dr. Brantley Stage with a benign pathology. 3. History of delayed healing of the left mastectomy incision. 4. Rheumatoid arthritis. 5. Diabetes. 6. Hypercholesterolemia. 7. Hypertension. 8. Family history of breast cancer. She has been evaluated at the genetic screening clinic. 9. 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 10. 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 Hand/foot syndrome secondary to Xeloda-Xeloda dose reduced 05/04/2016   Disposition: [name] has most recently been treated with Taxol on a 2-week schedule.  Recent abdominal MRI shows evidence of progression in the liver with an increase in the size and number of lesions.  She was also noted to have several new nodules at the lung bases bilaterally.  CT images were reviewed with her and her sister at today's visit.  Dr. Benay Spice recommends discontinuation of Taxol.  We are referring her for biopsy of a liver lesion which will be sent for foundation 1 testing and breast prognostic profile.  She is having right facial pain.  We are  referring her for a brain CT.  She will return for a follow-up visit in approximately 3 weeks.  She will contact the office in the interim with any problems.  Patient seen with Dr. Benay Spice.  25 minutes were spent face-to-face at today's visit with the majority of that time involved in counseling/coordination of care.    [name] ANP/GNP-BC   12/13/2017  10:19 AM  This was a shared visit with [name].  [name] has progressive metastatic breast cancer.  We reviewed the MRI images with her.  I will refer her for a biopsy of a liver lesion to repeat a breast prognostic profile and to obtain tissue for molecular testing.  She will return for an office visit in 3 weeks to discuss these results and plan additional systemic therapy.  We will check a brain CT to evaluate the right-sided head/face pain.  [name], MD

## 2017-12-16 ENCOUNTER — Other Ambulatory Visit: Payer: Self-pay | Admitting: Radiology

## 2017-12-19 ENCOUNTER — Ambulatory Visit (HOSPITAL_COMMUNITY)
Admission: RE | Admit: 2017-12-19 | Discharge: 2017-12-19 | Disposition: A | Discharge status: Home / Self Care | Payer: Medicare Other | Source: Ambulatory Visit | Attending: Nurse Practitioner | Admitting: Nurse Practitioner

## 2017-12-19 ENCOUNTER — Encounter (HOSPITAL_COMMUNITY): Payer: Self-pay

## 2017-12-19 ENCOUNTER — Ambulatory Visit (HOSPITAL_COMMUNITY)
Admission: RE | Admit: 2017-12-19 | Discharge: 2017-12-19 | Disposition: A | Discharge status: Home / Self Care | Payer: Medicare Other | Source: Ambulatory Visit | Attending: Oncology | Admitting: Oncology

## 2017-12-19 ENCOUNTER — Other Ambulatory Visit: Payer: Self-pay

## 2017-12-19 DIAGNOSIS — Z881 Allergy status to other antibiotic agents status: Secondary | ICD-10-CM | POA: Insufficient documentation

## 2017-12-19 DIAGNOSIS — Z79899 Other long term (current) drug therapy: Secondary | ICD-10-CM | POA: Diagnosis not present

## 2017-12-19 DIAGNOSIS — Z9012 Acquired absence of left breast and nipple: Secondary | ICD-10-CM | POA: Insufficient documentation

## 2017-12-19 DIAGNOSIS — Z96651 Presence of right artificial knee joint: Secondary | ICD-10-CM | POA: Diagnosis not present

## 2017-12-19 DIAGNOSIS — Z7984 Long term (current) use of oral hypoglycemic drugs: Secondary | ICD-10-CM | POA: Insufficient documentation

## 2017-12-19 DIAGNOSIS — E119 Type 2 diabetes mellitus without complications: Secondary | ICD-10-CM | POA: Insufficient documentation

## 2017-12-19 DIAGNOSIS — E785 Hyperlipidemia, unspecified: Secondary | ICD-10-CM | POA: Insufficient documentation

## 2017-12-19 DIAGNOSIS — C50912 Malignant neoplasm of unspecified site of left female breast: Secondary | ICD-10-CM | POA: Diagnosis present

## 2017-12-19 DIAGNOSIS — I10 Essential (primary) hypertension: Secondary | ICD-10-CM | POA: Diagnosis not present

## 2017-12-19 DIAGNOSIS — Z96641 Presence of right artificial hip joint: Secondary | ICD-10-CM | POA: Diagnosis not present

## 2017-12-19 DIAGNOSIS — Z923 Personal history of irradiation: Secondary | ICD-10-CM | POA: Insufficient documentation

## 2017-12-19 DIAGNOSIS — Z9049 Acquired absence of other specified parts of digestive tract: Secondary | ICD-10-CM | POA: Diagnosis not present

## 2017-12-19 DIAGNOSIS — C7951 Secondary malignant neoplasm of bone: Secondary | ICD-10-CM | POA: Insufficient documentation

## 2017-12-19 DIAGNOSIS — C787 Secondary malignant neoplasm of liver and intrahepatic bile duct: Secondary | ICD-10-CM | POA: Diagnosis not present

## 2017-12-19 LAB — COMPREHENSIVE METABOLIC PANEL
ALBUMIN: 3.7 g/dL (ref 3.5–5.0)
ALT: 13 U/L (ref 0–44)
AST: 31 U/L (ref 15–41)
Alkaline Phosphatase: 182 U/L — ABNORMAL HIGH (ref 38–126)
Anion gap: 10 (ref 5–15)
BUN: 15 mg/dL (ref 8–23)
CO2: 28 mmol/L (ref 22–32)
Calcium: 8.9 mg/dL (ref 8.9–10.3)
Chloride: 100 mmol/L (ref 98–111)
Creatinine, Ser: 0.96 mg/dL (ref 0.44–1.00)
GFR calc Af Amer: >60 mL/min (ref >60)
Glucose, Bld: 123 mg/dL — ABNORMAL HIGH (ref 70–99)
Potassium: 3.7 mmol/L (ref 3.5–5.1)
Sodium: 138 mmol/L (ref 135–145)
Total Bilirubin: 0.5 mg/dL (ref 0.3–1.2)
Total Protein: 7 g/dL (ref 6.5–8.1)

## 2017-12-19 LAB — CBC WITH DIFFERENTIAL/PLATELET
Abs Immature Granulocytes: 0.04 10*3/uL (ref 0.00–0.07)
BASOS ABS: 0 10*3/uL (ref 0.0–0.1)
Basophils Relative: 0 %
Eosinophils Absolute: 0.1 10*3/uL (ref 0.0–0.5)
Eosinophils Relative: 3 %
HEMATOCRIT: 35.5 % — AB (ref 36.0–46.0)
Hemoglobin: 10.9 g/dL — ABNORMAL LOW (ref 12.0–15.0)
Immature Granulocytes: 1 %
Lymphocytes Relative: 11 %
Lymphs Abs: 0.6 10*3/uL — ABNORMAL LOW (ref 0.7–4.0)
MCH: 29.5 pg (ref 26.0–34.0)
MCHC: 30.7 g/dL (ref 30.0–36.0)
MCV: 96.2 fL (ref 80.0–100.0)
Monocytes Absolute: 0.6 10*3/uL (ref 0.1–1.0)
Monocytes Relative: 11 %
Neutro Abs: 3.6 10*3/uL (ref 1.7–7.7)
Neutrophils Relative %: 74 %
Platelets: 215 10*3/uL (ref 150–400)
RBC: 3.69 MIL/uL — ABNORMAL LOW (ref 3.87–5.11)
RDW: 15.7 % — ABNORMAL HIGH (ref 11.5–15.5)
WBC: 4.9 10*3/uL (ref 4.0–10.5)
nRBC: 0 % (ref 0.0–0.2)

## 2017-12-19 LAB — GLUCOSE, CAPILLARY: Glucose-Capillary: 103 mg/dL — ABNORMAL HIGH (ref 70–99)

## 2017-12-19 LAB — PROTIME-INR
INR: 0.99
Prothrombin Time: 13 seconds (ref 11.4–15.2)

## 2017-12-19 MED ORDER — MIDAZOLAM HCL 2 MG/2ML IJ SOLN
INTRAMUSCULAR | Status: AC
  Filled 2017-12-19: qty 2

## 2017-12-19 MED ORDER — FENTANYL CITRATE (PF) 100 MCG/2ML IJ SOLN
INTRAMUSCULAR | Status: AC
  Filled 2017-12-19: qty 2

## 2017-12-19 MED ORDER — LIDOCAINE HCL 1 % IJ SOLN
INTRAMUSCULAR | Status: AC
  Filled 2017-12-19: qty 20

## 2017-12-19 MED ORDER — HYDROCODONE-ACETAMINOPHEN 5-325 MG PO TABS: 1.0000 | ORAL_TABLET | ORAL | Status: DC | PRN

## 2017-12-19 MED ORDER — HEPARIN SOD (PORK) LOCK FLUSH 100 UNIT/ML IV SOLN
500.0000 [IU] | Freq: Once | INTRAVENOUS | Status: AC
  Administered 2017-12-19: 500 [IU] via INTRAVENOUS
  Filled 2017-12-19: qty 5

## 2017-12-19 MED ORDER — SODIUM CHLORIDE 0.9 % IV SOLN: INTRAVENOUS | Status: DC

## 2017-12-19 MED ORDER — FENTANYL CITRATE (PF) 100 MCG/2ML IJ SOLN
INTRAMUSCULAR | Status: AC | PRN
  Administered 2017-12-19 (×3): 50 ug via INTRAVENOUS

## 2017-12-19 MED ORDER — MIDAZOLAM HCL 2 MG/2ML IJ SOLN
INTRAMUSCULAR | Status: AC | PRN
  Administered 2017-12-19 (×3): 1 mg via INTRAVENOUS

## 2017-12-19 MED ORDER — GELATIN ABSORBABLE 12-7 MM EX MISC
CUTANEOUS | Status: AC
  Filled 2017-12-19: qty 1

## 2017-12-19 NOTE — H&P (Signed)
Referring Physician(s): Sherrill,B  Supervising Physician: Markus Daft  Patient Status:  [facility] OP  Chief Complaint:  "I'm having a liver biopsy"  Subjective: Patient familiar to IR service from prior left iliac bone lesion biopsy on 07/26/2013 and port a cath placement on 05/23/2017.  [name] has a history of progressive metastatic breast cancer by imaging (originally diagnosed 2010) and presents again today for image guided liver lesion biopsy for foundation 1 and breast prognostic profile. [name] currently denies fever, CP,dyspnea, cough, vomiting or bleeding. [name] does have HA/facial pain, abd/back/hip pain, occ dry heaves.    Past Medical History:  Diagnosis Date  . Arthritis   . Bone metastases ([place])    breast primary  . Breast cancer ([place])   . Cancer ([place])    left breast  . Diabetes mellitus   . History of radiation therapy 10/2008   left chest wall, left supraclavicular region, mastectomy scar  . Hyperlipidemia   . Hypertension    Past Surgical History:  Procedure Laterality Date  . BONE DENSITY TEST  2008  . BREAST SURGERY    . CHOLECYSTECTOMY    . COLONOSCOPY  2007  . IR FLUORO GUIDE PORT INSERTION RIGHT  05/23/2017  . IR US GUIDE VASC ACCESS RIGHT  05/23/2017  . MASTECTOMY  2010   LEFT BREAST  . PORT-A-CATH REMOVAL    . PORTACATH PLACEMENT    . REPLACEMENT TOTAL KNEE  2008   RIGHT KNEE  . TOTAL HIP ARTHROPLASTY  2004   RIGHT HIP      Allergies: Doxycycline  Medications: Prior to Admission medications   Medication Sig Start Date End Date Taking? Authorizing Provider  Ascorbic Acid (VITAMIN C) 500 MG tablet Take 500 mg by mouth daily.     Yes [provider]  Calcium-Magnesium-Vitamin D (CALCIUM 500 PO) Take 1 tablet by mouth daily.   Yes [provider]  diazepam (VALIUM) 5 MG tablet Take 1 tablet (5 mg total) by mouth every 8 (eight) hours as needed for anxiety. 12/13/17  Yes Owens Shark, NP  diclofenac sodium (VOLTAREN) 1 % GEL Apply 2 g  topically as needed (for left hip, hands,shoulders,back).    Yes [provider]  diphenhydrAMINE (BENADRYL) 25 mg capsule Take 25 mg by mouth at bedtime as needed for sleep.   Yes [provider]  glipiZIDE (GLUCOTROL) 5 MG tablet Take 5 mg by mouth daily before breakfast. Reported on 07/24/2015   Yes [provider]  lidocaine-prilocaine (EMLA) cream APPLY 1-2 TEASPOONSFULL TO PORT SITE AS DIRECTED 1 - 2 HOURS PRIOR TO CHEMO APPOINTMENTS 09/07/17  Yes Ladell Pier, MD  magic mouthwash SOLN Take 5 mLs by mouth 4 (four) times daily as needed for mouth pain.   Yes [provider]  Multiple Vitamins-Minerals (CENTRUM SILVER PO) Take 1 tablet by mouth daily.    Yes [provider]  oxyCODONE (OXYCONTIN) 20 mg 12 hr tablet Take 2 tablets in the morning and 1 tablet in the evening 12/13/17  Yes Owens Shark, NP  oxyCODONE-acetaminophen (PERCOCET) 10-325 MG tablet Take 1 tablet by mouth every 4 (four) hours as needed for pain. Dx: U27.253, C79.51 12/13/17  Yes Owens Shark, NP  quinapril (ACCUPRIL) 20 MG tablet Take 20 mg by mouth at bedtime.   Yes [provider]  prochlorperazine (COMPAZINE) 10 MG tablet Take 1 tablet (10 mg total) by mouth every 6 (six) hours as needed for nausea or vomiting. 02/09/17   Betsy Coder  B, MD  rosuvastatin (CRESTOR) 10 MG tablet  04/11/17   [provider]     Vital Signs: BP 181/81  HR 81  R 18  O2 SATS 99% RA, TEMP 98.8    Physical Exam awake/alert; chest CTA bilat; clean, intact rt chest wall port a cath; heart- RRR; abd- obese,+BS,NT; no LE edema  Imaging: No results found.  Labs:  CBC: Recent Labs    11/15/17 1119 11/29/17 0814 12/13/17 0844 12/19/17 1130  WBC 5.3 4.6 5.7 4.9  HGB 9.9* 10.3* 10.9* 10.9*  HCT 31.8* 33.0* 34.4* 35.5*  PLT 168 169 178 215    COAGS: Recent Labs    05/23/17 1230 12/19/17 1130  INR 0.96 0.99    BMP: Recent Labs    11/01/17 1030 11/15/17 1119  11/29/17 0814 12/13/17 0844  NA 143 142 142 142  K 3.5 3.6 3.3* 3.5  CL 105 105 99 104  CO2 26 29 32 25  GLUCOSE 58* 71 107* 101*  BUN 12 13 14 19   CALCIUM 8.9 8.7* 9.3 9.4  CREATININE 0.97 0.91 1.13* 1.14*  GFRNONAA 60* >60 50* 50*  GFRAA >60 >60 57* 58*    LIVER FUNCTION TESTS: Recent Labs    11/01/17 1030 11/15/17 1119 11/29/17 0814 12/13/17 0844  BILITOT 0.4 0.3 0.4 0.3  AST 19 19 16 19   ALT 7 9 7 7   ALKPHOS 152* 196* 139* 169*  PROT 6.9 6.4* 6.9 6.9  ALBUMIN 3.3* 3.1* 3.5 3.4*    Assessment and Plan:  Pt with history of progressive metastatic breast cancer by imaging (originally diagnosed 2010); scheduled today for image guided liver lesion biopsy for foundation 1 and breast prognostic profile.Risks and benefits discussed with the patient/sister including, but not limited to bleeding, infection, damage to adjacent structures or low yield requiring additional tests.  All of the patient's questions were answered, patient is agreeable to proceed. Consent signed and in chart.      Electronically Signed: D. Rowe Robert, PA-C 12/19/2017, 12:20 PM   I spent a total of 25 minutes at the the patient's bedside AND on the patient's hospital floor or unit, greater than 50% of which was counseling/coordinating care for image guided liver lesion biopsy

## 2017-12-19 NOTE — Discharge Instructions (Signed)
Liver Biopsy, Care After °These instructions give you information on caring for yourself after your procedure. Your doctor may also give you more specific instructions. Call your doctor if you have any problems or questions after your procedure. °Follow these instructions at home: °· Rest at home for 1-2 days or as told by your doctor. °· Have someone stay with you for at least 24 hours. °· Do not do these things in the first 24 hours: °? Drive. °? Use machinery. °? Take care of other people. °? Sign legal documents. °? Take a bath or shower. °· There are many different ways to close and cover a cut (incision). For example, a cut can be closed with stitches, skin glue, or adhesive strips. Follow your doctor's instructions on: °? Taking care of your cut. °? Changing and removing your bandage (dressing). °? Removing whatever was used to close your cut. °· Do not drink alcohol in the first week. °· Do not lift more than 5 pounds or play contact sports for the first 2 weeks. °· Take medicines only as told by your doctor. For 1 week, do not take medicine that has aspirin in it or medicines like ibuprofen. °· Get your test results. °Contact a doctor if: °· A cut bleeds and leaves more than just a small spot of blood. °· A cut is red, puffs up (swells), or hurts more than before. °· Fluid or something else comes from a cut. °· A cut smells bad. °· You have a fever or chills. °Get help right away if: °· You have swelling, bloating, or pain in your belly (abdomen). °· You get dizzy or faint. °· You have a rash. °· You feel sick to your stomach (nauseous) or throw up (vomit). °· You have trouble breathing, feel short of breath, or feel faint. °· Your chest hurts. °· You have problems talking or seeing. °· You have trouble balancing or moving your arms or legs. °This information is not intended to replace advice given to you by your health care provider. Make sure you discuss any questions you have with your health care  provider. °Document Released: 10/14/2007 Document Revised: 06/12/2015 Document Reviewed: 03/02/2013 °Elsevier Interactive Patient Education © 2018 [employer] ° ° ° ° °Moderate Conscious Sedation, Adult, Care After °These instructions provide you with information about caring for yourself after your procedure. Your health care provider may also give you more specific instructions. Your treatment has been planned according to current medical practices, but problems sometimes occur. Call your health care provider if you have any problems or questions after your procedure. °What can I expect after the procedure? °After your procedure, it is common: °· To feel sleepy for several hours. °· To feel clumsy and have poor balance for several hours. °· To have poor judgment for several hours. °· To vomit if you eat too soon. ° °Follow these instructions at home: °For at least 24 hours after the procedure: ° °· Do not: °? Participate in activities where you could fall or become injured. °? Drive. °? Use heavy machinery. °? Drink alcohol. °? Take sleeping pills or medicines that cause drowsiness. °? Make important decisions or sign legal documents. °? Take care of children on your own. °· Rest. °Eating and drinking °· Follow the diet recommended by your health care provider. °· If you vomit: °? Drink water, juice, or soup when you can drink without vomiting. °? Make sure you have little or no nausea before eating solid foods. °General instructions °·   Have a responsible adult stay with you until you are awake and alert. °· Take over-the-counter and prescription medicines only as told by your health care provider. °· If you smoke, do not smoke without supervision. °· Keep all follow-up visits as told by your health care provider. This is important. °Contact a health care provider if: °· You keep feeling nauseous or you keep vomiting. °· You feel light-headed. °· You develop a rash. °· You have a fever. °Get help right away  if: °· You have trouble breathing. °This information is not intended to replace advice given to you by your health care provider. Make sure you discuss any questions you have with your health care provider. °Document Released: 10/25/2012 Document Revised: 06/09/2015 Document Reviewed: 04/26/2015 °Elsevier Interactive Patient Education © 2018 [employer] ° ° °

## 2017-12-19 NOTE — Procedures (Signed)
Interventional Radiology Procedure Note  Procedure: US guided liver lesion biopsy  Complications: None  Estimated Blood Loss: Minimal  Findings: Hypoechoic liver lesions.  5 cores obtained from right hepatic lesion.  Karelyn Brisby R. Anselm Pancoast, M.D Pager:  [phone]

## 2017-12-21 ENCOUNTER — Encounter (HOSPITAL_COMMUNITY): Payer: Self-pay

## 2017-12-21 ENCOUNTER — Ambulatory Visit (HOSPITAL_COMMUNITY)
Admission: RE | Admit: 2017-12-21 | Discharge: 2017-12-21 | Disposition: A | Discharge status: Home / Self Care | Payer: Medicare Other | Source: Ambulatory Visit | Attending: Nurse Practitioner | Admitting: Nurse Practitioner

## 2017-12-21 DIAGNOSIS — C50912 Malignant neoplasm of unspecified site of left female breast: Secondary | ICD-10-CM | POA: Diagnosis not present

## 2017-12-21 DIAGNOSIS — C7951 Secondary malignant neoplasm of bone: Secondary | ICD-10-CM | POA: Insufficient documentation

## 2017-12-21 MED ORDER — IOHEXOL 300 MG/ML  SOLN
75.0000 mL | Freq: Once | INTRAMUSCULAR | Status: AC | PRN
  Administered 2017-12-21: 75 mL via INTRAVENOUS

## 2017-12-21 MED ORDER — SODIUM CHLORIDE (PF) 0.9 % IJ SOLN
INTRAMUSCULAR | Status: AC
  Filled 2017-12-21: qty 50

## 2017-12-21 MED ORDER — HEPARIN SOD (PORK) LOCK FLUSH 100 UNIT/ML IV SOLN
500.0000 [IU] | Freq: Once | INTRAVENOUS | Status: AC
  Administered 2017-12-21: 500 [IU]

## 2017-12-21 MED ORDER — HEPARIN SOD (PORK) LOCK FLUSH 100 UNIT/ML IV SOLN
INTRAVENOUS | Status: AC
  Administered 2017-12-21: 500 [IU]
  Filled 2017-12-21: qty 5

## 2018-01-02 ENCOUNTER — Inpatient Hospital Stay: Payer: Medicare Other | Attending: Oncology | Admitting: Oncology

## 2018-01-02 ENCOUNTER — Telehealth: Payer: Self-pay | Admitting: *Deleted

## 2018-01-02 ENCOUNTER — Inpatient Hospital Stay: Payer: Medicare Other

## 2018-01-02 VITALS — BP 167/107 | HR 84 | Temp 98.4°F | Resp 18 | Ht 62.5 in | Wt 178.4 lb

## 2018-01-02 DIAGNOSIS — I10 Essential (primary) hypertension: Secondary | ICD-10-CM | POA: Diagnosis not present

## 2018-01-02 DIAGNOSIS — C50919 Malignant neoplasm of unspecified site of unspecified female breast: Secondary | ICD-10-CM | POA: Diagnosis present

## 2018-01-02 DIAGNOSIS — G893 Neoplasm related pain (acute) (chronic): Secondary | ICD-10-CM

## 2018-01-02 DIAGNOSIS — C50912 Malignant neoplasm of unspecified site of left female breast: Secondary | ICD-10-CM

## 2018-01-02 DIAGNOSIS — R911 Solitary pulmonary nodule: Secondary | ICD-10-CM

## 2018-01-02 DIAGNOSIS — C7951 Secondary malignant neoplasm of bone: Secondary | ICD-10-CM | POA: Insufficient documentation

## 2018-01-02 DIAGNOSIS — R4182 Altered mental status, unspecified: Secondary | ICD-10-CM | POA: Diagnosis not present

## 2018-01-02 DIAGNOSIS — C787 Secondary malignant neoplasm of liver and intrahepatic bile duct: Secondary | ICD-10-CM | POA: Diagnosis not present

## 2018-01-02 DIAGNOSIS — Z5111 Encounter for antineoplastic chemotherapy: Secondary | ICD-10-CM | POA: Insufficient documentation

## 2018-01-02 DIAGNOSIS — E119 Type 2 diabetes mellitus without complications: Secondary | ICD-10-CM | POA: Diagnosis not present

## 2018-01-02 DIAGNOSIS — M069 Rheumatoid arthritis, unspecified: Secondary | ICD-10-CM | POA: Diagnosis not present

## 2018-01-02 DIAGNOSIS — R627 Adult failure to thrive: Secondary | ICD-10-CM | POA: Insufficient documentation

## 2018-01-02 LAB — CMP (CANCER CENTER ONLY)
ALT: 10 U/L (ref 0–44)
AST: 31 U/L (ref 15–41)
Albumin: 3.5 g/dL (ref 3.5–5.0)
Alkaline Phosphatase: 240 U/L — ABNORMAL HIGH (ref 38–126)
Anion gap: 12 (ref 5–15)
BUN: 15 mg/dL (ref 8–23)
CHLORIDE: 103 mmol/L (ref 98–111)
CO2: 28 mmol/L (ref 22–32)
Calcium: 9.6 mg/dL (ref 8.9–10.3)
Creatinine: 0.95 mg/dL (ref 0.44–1.00)
GFR, Est AFR Am: >60 mL/min (ref >60)
Glucose, Bld: 63 mg/dL — ABNORMAL LOW (ref 70–99)
POTASSIUM: 3.6 mmol/L (ref 3.5–5.1)
Sodium: 143 mmol/L (ref 135–145)
Total Bilirubin: 0.4 mg/dL (ref 0.3–1.2)
Total Protein: 7.5 g/dL (ref 6.5–8.1)

## 2018-01-02 MED ORDER — HEPARIN SOD (PORK) LOCK FLUSH 100 UNIT/ML IV SOLN
500.0000 [IU] | Freq: Once | INTRAVENOUS | Status: AC
  Administered 2018-01-02: 500 [IU] via INTRAVENOUS
  Filled 2018-01-02: qty 5

## 2018-01-02 MED ORDER — SODIUM CHLORIDE 0.9% FLUSH
10.0000 mL | INTRAVENOUS | Status: DC | PRN
  Administered 2018-01-02: 10 mL via INTRAVENOUS
  Filled 2018-01-02: qty 10

## 2018-01-02 NOTE — Progress Notes (Signed)
[place] OFFICE PROGRESS NOTE   Diagnosis: Breast cancer  INTERVAL HISTORY:   [name] returns prior to a scheduled visit.  She is here today with her sister.  Her sister reports she has anorexia and confusion.  She has had very little to eat or drink over the past few days.  She has increased pain.  The family is not sure whether she is taking the pain medication correctly. She underwent an ultrasound-guided biopsy of a liver lesion on 12/19/2017.  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  Objective:  Vital signs in last 24 hours:  Blood pressure (!) 167/107, pulse 84, temperature 98.4 F (36.9 C), temperature source Oral, resp. rate 18, height 5' 2.5" (1.588 m), weight 178 lb 6.4 oz (80.9 kg), SpO2 97 %.    HEENT: No thrush Resp: Lungs clear bilaterally Cardio: Regular rate and rhythm GI: No hepatomegaly, nontender Vascular: No leg edema Neuro: Alert, oriented to place.  Not oriented to day or year.  The face is symmetric.  The motor exam appears intact in the upper and lower extremities the extraocular movements are intact    Portacath/PICC-without erythema  Lab Results:  Lab Results  Component Value Date   WBC 4.9 12/19/2017   HGB 10.9 (L) 12/19/2017   HCT 35.5 (L) 12/19/2017   MCV 96.2 12/19/2017   PLT 215 12/19/2017   NEUTROABS 3.6 12/19/2017    CMP  Lab Results  Component Value Date   NA 143 01/02/2018   K 3.6 01/02/2018   CL 103 01/02/2018   CO2 28 01/02/2018   GLUCOSE 63 (L) 01/02/2018   BUN 15 01/02/2018   CREATININE 0.95 01/02/2018   CALCIUM 9.6 01/02/2018   PROT 7.5 01/02/2018   ALBUMIN 3.5 01/02/2018   AST 31 01/02/2018   ALT 10 01/02/2018   ALKPHOS 240 (H) 01/02/2018   BILITOT 0.4 01/02/2018   GFRNONAA >60 01/02/2018   GFRAA >60 01/02/2018     Medications: I have reviewed the patient's current medications.   Assessment/Plan: . 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 ER positive, PR positive, HER-2 negative She completed 4 cycles of adjuvant AC chemotherapy June 15 through September 03, 2008, and then completed the left chest wall radiation. She began Arimidex following an office visit on November 11, 2008.  Bone scan 06/01/2013 suggestive of thoracic spine metastases, thoracic MRI 06/27/2011 consistent with multiple bone metastases involving the thoracolumbar spine   PET scan 07/09/2013 with multiple hypermetabolic bone lesions and hypermetabolic mediastinal nodes.   Left iliac lesion biopsy 07/26/2013. Pathology showed metastatic carcinoma, ER positive, PR positive, HER-2 negative   Initiation of Faslodex 08/10/2013.  Xgeva every 3 months initiated 07/27/2013.  Restaging PET scan 01/23/2014 with no residual hypermetabolic mediastinal disease; marked improvement in the metastatic bone disease.  Restaging PET scan 02/17/2015 with recurrence of skeletal metastasis. New hypermetabolic lesions within the pelvis and ribs. No hypermetabolic lymphadenopathy.  Continuation of Faslodex; initiation of Ibrance 03/05/2015  Cycle 2 Ibrance 04/09/2015-dose reduced to 100 mg daily  Cycle 3 Ibrance 05/08/2015  Cycle 4 Ibrance 06/11/2015  Cycle 5 Ibrance 07/15/2015  PET scan 08/12/2015-increase in size and increased metabolic activity associated with bone metastases, no new lesions  Ibrance/Faslodex discontinued  Tamoxifen started 08/07/2015  PET scan 02/04/2016 with new hypermetabolic liver lesions and numerous new skeletal lesions.  Xeloda, 7 days  on/7 days off initiated February 2018  Xeloda dose reduced secondary to hand/foot syndrome beginning on 05/04/2016  Restaging MRI liver 05/18/2016-2 dominant hepatic metastases measuring up to 13 mm favored to be improved.  Xeloda continued  PET scan  11/12/2016-significant improvement with complete resolution of multiple hepatic hypermetabolic lesions and most of the prior bony hypermetabolic lesions. The remaining hypermetabolic hepatic and bony lesions are smaller and/or demonstrate significantly reduced activity.  Xeloda continued  MRI abdomen 05/16/2017-mixed response to therapy. 1 previously noted liver lesion in segment 3 completely resolved; other previously noted hepatic lesion increased significantly in size. 2 new liver lesions. Previously noted lesions in the lumbar spine similar.  Cycle 1 weekly Taxol 05/30/2017  Cycle 2 weekly Taxol 06/27/2017  Cycle 3 weekly Taxol 07/25/2017  MRI abdomen7/31/2019-3 previously noted hepatic lesions appear stable to slightly decreased in size; one new hepatic lesion measuring 1.2 x 0.9 cm. Widespread metastatic disease to the bones redemonstrated.  Taxol continued every 2 weeks  MRI abdomen 12/09/2017- liver metastases increased in size and number.  Several new pulmonary nodules at the lung bases bilaterally.  Widespread bone metastases appear stable.  Biopsy of right liver lesion 12/19/2017, metastatic adenocarcinoma, ER positive, PR positive, HER-2 negative, MSS, tumor mutation burden-8, PI K3CA alteration 2. Left chest subcutaneous mass noted on exam October 04, 2008, status post biopsy by Dr. Brantley Stage with a benign pathology. 3. History of delayed healing of the left mastectomy incision. 4. Rheumatoid arthritis. 5. Diabetes. 6. Hypercholesterolemia. 7. Hypertension. 8. Family history of breast cancer. She has been evaluated at the genetic screening clinic. 9. 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 10. 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 Hand/foot syndrome secondary to Xeloda-Xeloda dose reduced 05/04/2016   Disposition: [name] has metastatic breast cancer.  She has clinical and radiologic evidence of progressive metastatic disease.  She presents today with failure to thrive and altered mental status.  A CT of the brain on 12/21/2017 revealed no evidence of metastatic disease and no acute finding.  The altered mental status may be related to polypharmacy or CNS disease not appreciated on the CT.  Her sister will administer her medication.  She will be referred for a brain MRI.  She will return for an office visit on 01/06/2018.  She has progressive metastatic breast cancer after multiple lines of systemic therapy.  We began a discussion regarding hospice care versus a trial of salvage systemic therapy.  We can consider treatment with systemic therapy based on the PIK3ca alteration.  25 minutes were spent with the patient today.  The majority of the time was used for counseling and coordination of care.  Betsy Coder, MD  01/02/2018  5:11 PM

## 2018-01-02 NOTE — Telephone Encounter (Signed)
Called sister back and offered her an appointment with Dr. Benay Spice at 2:30 pm today with labs prior. Her other sister will help get her in. She has become weaker and difficult to move around. Also just reported that right side of her head looks swollen to them. They will get her in by 2:15 pm.

## 2018-01-02 NOTE — Telephone Encounter (Signed)
Call from sister reporting she is becoming more confused. Conversation rarely makes sense any longer. She is more lethargic as well. No nausea. Still seems to have some headaches-rubs right side of head. Does not see any weakness in one side over the other. Difficult to get her to eat or drink. Had CT brain on 12/21/17.

## 2018-01-04 ENCOUNTER — Telehealth: Payer: Self-pay | Admitting: *Deleted

## 2018-01-04 NOTE — Telephone Encounter (Signed)
Per Dr. Benay Spice: Cancel MRI for now. Follow up as scheduled on 01/06/18. Notified sister and she agrees. Called [facility] and cancelled the scan per Dr. Benay Spice.

## 2018-01-04 NOTE — Telephone Encounter (Signed)
Sister, Margaretha Sheffield called to follow up on status of MRI Dr. Benay Spice had ordered. This RN called [facility] on [address] to initiate the process. Was informed there are no openings this week. Drusilla Kanner will speak with her supervisor to determine if they can scan her this week. Requested contact information for the patient stating they need to speak with patient to schedule the scan. Provided number of her sister, Margaretha Sheffield and stressed she needs to be called asap--patient has appointment on 01/06/18 with oncologist. Clyde Canterbury and updated her on status. Also provided her phone # for imaging center to call if she does not hear from them soon. She reports that "her mind has cleared up now". She is back to herself and actually slept 5 hours straight last night. They have taken over distribution of her medications and someone is with her always. She is asking if we still need to do the MRI. Will follow up with MD and call her back.

## 2018-01-06 ENCOUNTER — Inpatient Hospital Stay (HOSPITAL_BASED_OUTPATIENT_CLINIC_OR_DEPARTMENT_OTHER): Payer: Medicare Other | Admitting: Oncology

## 2018-01-06 ENCOUNTER — Telehealth: Payer: Self-pay

## 2018-01-06 ENCOUNTER — Telehealth: Payer: Self-pay | Admitting: Pharmacist

## 2018-01-06 VITALS — BP 149/77 | HR 95 | Temp 99.0°F | Resp 18 | Ht 62.0 in | Wt 181.0 lb

## 2018-01-06 DIAGNOSIS — M545 Low back pain: Secondary | ICD-10-CM | POA: Diagnosis not present

## 2018-01-06 DIAGNOSIS — C787 Secondary malignant neoplasm of liver and intrahepatic bile duct: Secondary | ICD-10-CM | POA: Diagnosis not present

## 2018-01-06 DIAGNOSIS — R627 Adult failure to thrive: Secondary | ICD-10-CM

## 2018-01-06 DIAGNOSIS — M25559 Pain in unspecified hip: Secondary | ICD-10-CM

## 2018-01-06 DIAGNOSIS — I10 Essential (primary) hypertension: Secondary | ICD-10-CM

## 2018-01-06 DIAGNOSIS — C50912 Malignant neoplasm of unspecified site of left female breast: Secondary | ICD-10-CM

## 2018-01-06 DIAGNOSIS — R4182 Altered mental status, unspecified: Secondary | ICD-10-CM

## 2018-01-06 DIAGNOSIS — C7951 Secondary malignant neoplasm of bone: Secondary | ICD-10-CM | POA: Diagnosis not present

## 2018-01-06 DIAGNOSIS — Z95828 Presence of other vascular implants and grafts: Secondary | ICD-10-CM

## 2018-01-06 DIAGNOSIS — M069 Rheumatoid arthritis, unspecified: Secondary | ICD-10-CM

## 2018-01-06 DIAGNOSIS — E119 Type 2 diabetes mellitus without complications: Secondary | ICD-10-CM

## 2018-01-06 DIAGNOSIS — C50919 Malignant neoplasm of unspecified site of unspecified female breast: Secondary | ICD-10-CM

## 2018-01-06 DIAGNOSIS — Z5111 Encounter for antineoplastic chemotherapy: Secondary | ICD-10-CM | POA: Diagnosis not present

## 2018-01-06 MED ORDER — FULVESTRANT 250 MG/5ML IM SOLN
500.0000 mg | Freq: Once | INTRAMUSCULAR | Status: AC
  Administered 2018-01-06: 500 mg via INTRAMUSCULAR

## 2018-01-06 MED ORDER — FULVESTRANT 250 MG/5ML IM SOLN
INTRAMUSCULAR | Status: AC
  Filled 2018-01-06: qty 5

## 2018-01-06 MED ORDER — ALPELISIB (300 MG DAILY DOSE) 2 X 150 MG PO TBPK: ORAL_TABLET | ORAL | 0 refills | Status: DC

## 2018-01-06 NOTE — Addendum Note (Signed)
Addended by: Lenox Ponds E on: 01/06/2018 11:16 AM   Modules accepted: Orders

## 2018-01-06 NOTE — Addendum Note (Signed)
Addended by: Tania Ade on: 01/06/2018 01:42 PM   Modules accepted: Orders

## 2018-01-06 NOTE — Telephone Encounter (Signed)
Oral Oncology Patient Advocate Encounter  Received notification from [place] that prior authorization for [name] is required.  PA submitted on CoverMyMeds Key [id number] Status is pending  Oral Oncology Clinic will continue to follow.  [place] Patient [name] Phone [phone] Fax [phone]

## 2018-01-06 NOTE — Patient Instructions (Signed)

## 2018-01-06 NOTE — Telephone Encounter (Signed)
Oral Oncology Pharmacist Encounter  Received new prescription for Piqray (alpelisib) for the treatment of metastatic, hormone receptor positive breast cancer, with known PIK3CA mutation in conjunction with Faslodex, planned duration until disease progression or unacceptable toxicity.  Labs from 01/02/18 assessed, OK for treatment.  No HbA1c in Epic, will be checked at next visit and monitored periodically during treatment with [place].  Noted CBGs alternate between slightly high and slightly low, patient on glipizide for CBG control, low threshold for adding additional hyperglycemic agents, will hold off at this time due to some low readings. Will discuss CBG monitoring with patient.  Current medication list in Epic reviewed, no DDIs with Piqray identified.  Prescription has been e-scribed to the [facility] for benefits analysis and approval.  Oral Oncology Clinic will continue to follow for insurance authorization, copayment issues, initial counseling and start date.  Johny Drilling, PharmD, BCPS, BCOP  01/06/2018 10:01 AM Oral Oncology Clinic [phone]

## 2018-01-06 NOTE — Telephone Encounter (Signed)
Oral Oncology Patient Advocate Encounter  Prior Authorization for [name] has been approved.    PA# [id number] Effective dates: 01/06/18 through 01/18/19  Oral Oncology Clinic will continue to follow.   [place] Patient [name] Phone [phone] Fax [phone]

## 2018-01-06 NOTE — Telephone Encounter (Signed)
Printed avs and calender of upcoming appointment. per 12/20 los

## 2018-01-06 NOTE — Telephone Encounter (Signed)
Oral Chemotherapy Pharmacist Encounter   I spoke with patient and family members in exam room for overview of new oral chemotherapy medication: Piqray (alpelisib) for the treatment of metastatic, hormone receptor positive breast cancer, previously treated with endocrine based therapy, with known PIK3CA mutation, in conjunction with Faslodex, planned duration until disease progression or unacceptable toxicity.  Counseled on administration, dosing, side effects, monitoring, drug-food interactions, safe handling, storage, and disposal.  Patient will take Piqray 163m tablets, 2 tablets (300 mg) by mouth once daily with food.  Fluvestrant will be initiated with loading sequence as 5031mIM injection on days 1, 15, and 28, continued thereafter as 50015mM injections once monthly.  Fulvestrant start date: 01/06/18 Piqray start date: TBD, pending medication acquisition  Side effects include but not limited to: hyperglycemia, rash/hypersensitivity reaction, diarrhea, fatigue, nausea, vomiting, mouth sores, alopecia, taste changes, decreased appetite, severe lung reactions, and arthralgias.  Patient has anti-emetic on hand and knows to take it if nausea develops.   Patient will obtain anti diarrheal and alert the office of 4 or more loose stools above baseline.  Discussed with patient need for glucose control prior to PiqWashingtonitiation, and possible need for additional anti-diabetic agents for the treatment of medication-induced hyperglycemia while on treatment with Piqray.   She currently checks her CBGs 3x/week and will alert the office to any new hyperglycemia immediately.  Discussed possibility of severe cutaneous reactions with Piqray. Patient will alert the office of any dermatologic toxicity that may occur after Piqray initiation. She will obtain Zyrtec from the pharmacy for prophylaxis.  Reviewed with patient importance of keeping a medication schedule and plan for any missed doses.  [name] voiced understanding and appreciation.   All questions answered. Medication reconciliation performed and medication/allergy list updated.  Will follow up with patient regarding insurance and pharmacy once insurance authorization is obtained and copayment is known.   Patient knows to call the office with questions or concerns. Oral Oncology Clinic will continue to follow.  JesJohny DrillingharmD, BCPS, BCOP  01/06/2018   11:07 AM Oral Oncology Clinic [phone]

## 2018-01-06 NOTE — Progress Notes (Signed)
[place] OFFICE PROGRESS NOTE   Diagnosis: Breast cancer  INTERVAL HISTORY:   [name] returns for a scheduled visit.  She had confusion and failure to thrive when I saw her earlier this week.  Her family report her performance status is much improved over the past few days.  They have been dispensing her medications.  Her mental status has returned to baseline.  She complains of increased pain in the back and hip.  The pain is not relieved with 10 mg of oxycodone.  Objective:  Vital signs in last 24 hours:  Blood pressure (!) 149/77, pulse 95, temperature 99 F (37.2 C), temperature source Oral, resp. rate 18, height '5\' 2"'  (1.575 m), weight 181 lb (82.1 kg), SpO2 94 %.    HEENT: No thrush Resp: Lungs clear bilaterally Cardio: Regular rate and rhythm GI: No hepatomegaly, nontender Vascular: No leg edema Neuro: Alert and oriented   Portacath/PICC-without erythema  Lab Results:  Lab Results  Component Value Date   WBC 4.9 12/19/2017   HGB 10.9 (L) 12/19/2017   HCT 35.5 (L) 12/19/2017   MCV 96.2 12/19/2017   PLT 215 12/19/2017   NEUTROABS 3.6 12/19/2017    CMP  Lab Results  Component Value Date   NA 143 01/02/2018   K 3.6 01/02/2018   CL 103 01/02/2018   CO2 28 01/02/2018   GLUCOSE 63 (L) 01/02/2018   BUN 15 01/02/2018   CREATININE 0.95 01/02/2018   CALCIUM 9.6 01/02/2018   PROT 7.5 01/02/2018   ALBUMIN 3.5 01/02/2018   AST 31 01/02/2018   ALT 10 01/02/2018   ALKPHOS 240 (H) 01/02/2018   BILITOT 0.4 01/02/2018   GFRNONAA >60 01/02/2018   GFRAA >60 01/02/2018     Medications: I have reviewed the patient's current medications.   Assessment/Plan:  1. 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 ER positive, PR positive, HER-2 negative She completed 4 cycles of adjuvant AC chemotherapy June 15 through  September 03, 2008, and then completed the left chest wall radiation. She began Arimidex following an office visit on November 11, 2008.  Bone scan 06/01/2013 suggestive of thoracic spine metastases, thoracic MRI 06/27/2011 consistent with multiple bone metastases involving the thoracolumbar spine   PET scan 07/09/2013 with multiple hypermetabolic bone lesions and hypermetabolic mediastinal nodes.   Left iliac lesion biopsy 07/26/2013. Pathology showed metastatic carcinoma, ER positive, PR positive, HER-2 negative   Initiation of Faslodex 08/10/2013.  Xgeva every 3 months initiated 07/27/2013.  Restaging PET scan 01/23/2014 with no residual hypermetabolic mediastinal disease; marked improvement in the metastatic bone disease.  Restaging PET scan 02/17/2015 with recurrence of skeletal metastasis. New hypermetabolic lesions within the pelvis and ribs. No hypermetabolic lymphadenopathy.  Continuation of Faslodex; initiation of Ibrance 03/05/2015  Cycle 2 Ibrance 04/09/2015-dose reduced to 100 mg daily  Cycle 3 Ibrance 05/08/2015  Cycle 4 Ibrance 06/11/2015  Cycle 5 Ibrance 07/15/2015  PET scan 08/12/2015-increase in size and increased metabolic activity associated with bone metastases, no new lesions  Ibrance/Faslodex discontinued  Tamoxifen started 08/07/2015  PET scan 02/04/2016 with new hypermetabolic liver lesions and numerous new skeletal lesions.  Xeloda, 7 days on/7 days off initiated February 2018  Xeloda dose reduced secondary to hand/foot syndrome beginning on 05/04/2016  Restaging MRI liver 05/18/2016-2 dominant hepatic metastases measuring up to 13 mm favored to be improved.  Xeloda continued  PET scan 11/12/2016-significant  improvement with complete resolution of multiple hepatic hypermetabolic lesions and most of the prior bony hypermetabolic lesions. The remaining hypermetabolic hepatic and bony lesions are smaller and/or demonstrate significantly reduced  activity.  Xeloda continued  MRI abdomen 05/16/2017-mixed response to therapy. 1 previously noted liver lesion in segment 3 completely resolved; other previously noted hepatic lesion increased significantly in size. 2 new liver lesions. Previously noted lesions in the lumbar spine similar.  Cycle 1 weekly Taxol 05/30/2017  Cycle 2 weekly Taxol 06/27/2017  Cycle 3 weekly Taxol 07/25/2017  MRI abdomen7/31/2019-3 previously noted hepatic lesions appear stable to slightly decreased in size; one new hepatic lesion measuring 1.2 x 0.9 cm. Widespread metastatic disease to the bones redemonstrated.  Taxol continued every 2 weeks  MRI abdomen 12/09/2017- liver metastases increased in size and number.  Several new pulmonary nodules at the lung bases bilaterally.  Widespread bone metastases appear stable.  Biopsy of right liver lesion 12/19/2017, metastatic adenocarcinoma, ER positive, PR positive, HER-2 negative, MSS, tumor mutation burden-8, PI K3CA alteration 2. Left chest subcutaneous mass noted on exam October 04, 2008, status post biopsy by Dr. Brantley Stage with a benign pathology. 3. History of delayed healing of the left mastectomy incision. 4. Rheumatoid arthritis. 5. Diabetes. 6. Hypercholesterolemia. 7. Hypertension. 8. Family history of breast cancer. She has been evaluated at the genetic screening clinic. 9. 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 10. 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 Hand/foot syndrome secondary to Xeloda-Xeloda dose reduced 05/04/2016  Disposition: [name] has metastatic breast cancer.  The tumor is hormone receptor positive and there is a PIK3CA alteration.  I  discussed treatment options with Ms. Lento and her family.  This included a discussion of supportive care versus a trial of salvage systemic therapy.  I recommend she get a trial of fulvestrant and alpelisib.  We discussed potential toxicities associated with this regimen including the chance for hyperglycemia, rash, and diarrhea with the alpelisib.  She agrees to proceed.  She will meet with the Cancer center pharmacist today.  We will plan to begin treatment within the next 1 week.  [name] has a healthcare power of attorney.  She would like to be placed on a no CODE BLUE status.  Her family is concerned about her safety in the home.  We will make a referral to advanced home care for a home PT/OT safety evaluation.  She will return for an office lab visit on 01/13/2018.  Betsy Coder, MD  01/06/2018  9:34 AM

## 2018-01-09 ENCOUNTER — Telehealth: Payer: Self-pay | Admitting: *Deleted

## 2018-01-09 MED FILL — PIQRAY 300MG DAILY DOSE 2X1: 2 X 150 | 28 days supply | Qty: 56 | Fill #0

## 2018-01-09 NOTE — Telephone Encounter (Signed)
Need verbal orders to see patient twice this week and once next week as well as a Education officer, museum referral to look into community resources. Provided OK per Dr. Benay Spice for above.

## 2018-01-10 ENCOUNTER — Telehealth: Payer: Self-pay | Admitting: *Deleted

## 2018-01-10 NOTE — Telephone Encounter (Signed)
Called to request verbal order for home health nurse to see patient. OK per Dr. Benay Spice.

## 2018-01-12 ENCOUNTER — Inpatient Hospital Stay: Payer: Medicare Other

## 2018-01-12 ENCOUNTER — Telehealth: Payer: Self-pay | Admitting: *Deleted

## 2018-01-12 VITALS — BP 193/94 | HR 68 | Temp 98.6°F | Resp 16

## 2018-01-12 DIAGNOSIS — C7951 Secondary malignant neoplasm of bone: Principal | ICD-10-CM

## 2018-01-12 DIAGNOSIS — Z5111 Encounter for antineoplastic chemotherapy: Secondary | ICD-10-CM | POA: Diagnosis not present

## 2018-01-12 DIAGNOSIS — C50912 Malignant neoplasm of unspecified site of left female breast: Secondary | ICD-10-CM

## 2018-01-12 DIAGNOSIS — Z95828 Presence of other vascular implants and grafts: Secondary | ICD-10-CM

## 2018-01-12 LAB — CBC WITH DIFFERENTIAL (CANCER CENTER ONLY)
Abs Immature Granulocytes: 0.06 10*3/uL (ref 0.00–0.07)
Basophils Absolute: 0 10*3/uL (ref 0.0–0.1)
Basophils Relative: 0 %
Eosinophils Absolute: 0.3 10*3/uL (ref 0.0–0.5)
Eosinophils Relative: 4 %
HCT: 38.2 % (ref 36.0–46.0)
Hemoglobin: 12 g/dL (ref 12.0–15.0)
Immature Granulocytes: 1 %
Lymphocytes Relative: 15 %
Lymphs Abs: 1.1 10*3/uL (ref 0.7–4.0)
MCH: 28.8 pg (ref 26.0–34.0)
MCHC: 31.4 g/dL (ref 30.0–36.0)
MCV: 91.6 fL (ref 80.0–100.0)
MONOS PCT: 7 %
Monocytes Absolute: 0.5 10*3/uL (ref 0.1–1.0)
NEUTROS PCT: 73 %
Neutro Abs: 5.3 10*3/uL (ref 1.7–7.7)
Platelet Count: 220 10*3/uL (ref 150–400)
RBC: 4.17 MIL/uL (ref 3.87–5.11)
RDW: 15.2 % (ref 11.5–15.5)
WBC Count: 7.3 10*3/uL (ref 4.0–10.5)
nRBC: 0 % (ref 0.0–0.2)

## 2018-01-12 LAB — CMP (CANCER CENTER ONLY)
ALT: 11 U/L (ref 0–44)
AST: 29 U/L (ref 15–41)
Albumin: 3.2 g/dL — ABNORMAL LOW (ref 3.5–5.0)
Alkaline Phosphatase: 197 U/L — ABNORMAL HIGH (ref 38–126)
Anion gap: 13 (ref 5–15)
BUN: 21 mg/dL (ref 8–23)
CALCIUM: 9.9 mg/dL (ref 8.9–10.3)
CO2: 28 mmol/L (ref 22–32)
Chloride: 97 mmol/L — ABNORMAL LOW (ref 98–111)
Creatinine: 1.41 mg/dL — ABNORMAL HIGH (ref 0.44–1.00)
GFR, Est AFR Am: 45 mL/min — ABNORMAL LOW (ref >60)
GFR, Estimated: 39 mL/min — ABNORMAL LOW (ref >60)
GLUCOSE: 235 mg/dL — AB (ref 70–99)
Potassium: 3.9 mmol/L (ref 3.5–5.1)
Sodium: 138 mmol/L (ref 135–145)
Total Bilirubin: 0.2 mg/dL — ABNORMAL LOW (ref 0.3–1.2)
Total Protein: 7.2 g/dL (ref 6.5–8.1)

## 2018-01-12 MED ORDER — HEPARIN SOD (PORK) LOCK FLUSH 100 UNIT/ML IV SOLN
500.0000 [IU] | INTRAVENOUS | Status: AC | PRN
  Administered 2018-01-12: 500 [IU]
  Filled 2018-01-12: qty 5

## 2018-01-12 MED ORDER — SODIUM CHLORIDE 0.9% FLUSH
10.0000 mL | INTRAVENOUS | Status: AC | PRN
  Administered 2018-01-12: 10 mL
  Filled 2018-01-12: qty 10

## 2018-01-12 NOTE — Telephone Encounter (Signed)
Provided [name] the lab results and instructions to check her glucose daily and call if over 300. She reports Friday, 01/20/18 would be best for her to come to appointment and she has transportation that day. Has lab appointment at PCP office, [facility]. Suggested we do all her labs here that day and send results there. She was in agreement with this. Sister asking if Dr. Benay Spice would take over all her PCP issues? [name] will call back tomorrow with her work schedule. [place] and was informed she had following labs ordered: CBC, CMP, Lipid Panel and A1-C. Informed them we will do all these here (A1-c already done today) and fax results there to [phone]. They will keep her 01/26/18 visit with PCP.

## 2018-01-12 NOTE — Telephone Encounter (Signed)
-----   Message from Ladell Pier, MD sent at 01/12/2018  2:29 PM EST ----- Please call patient, labs look ok, blood sugar is high, check blood sugar daily- call for blood sugar greater than 300, we will schedule f/u for 1 week

## 2018-01-12 NOTE — Telephone Encounter (Signed)
Informed patient of her lab results and to check BS daily and call for glucose >300. MD wants to see her on 1/2 with her lab/injection, but she has no transportation for that day. She will have her sister, Margaretha Sheffield call and let office know when she can bring her next week.

## 2018-01-12 NOTE — Telephone Encounter (Signed)
Oral Oncology Patient Advocate Encounter  Confirmed with [place] that [name] was picked up on 01/09/18 with a  $65 copay   [place] Patient [name] Phone [phone] Fax [phone]

## 2018-01-12 NOTE — Addendum Note (Signed)
Addended by: Tania Ade on: 01/12/2018 04:51 PM   Modules accepted: Orders

## 2018-01-13 ENCOUNTER — Telehealth: Payer: Self-pay | Admitting: Oncology

## 2018-01-13 ENCOUNTER — Ambulatory Visit: Payer: Medicare Other | Admitting: Oncology

## 2018-01-13 ENCOUNTER — Other Ambulatory Visit: Payer: Self-pay | Admitting: *Deleted

## 2018-01-13 LAB — HEMOGLOBIN A1C
Hgb A1c MFr Bld: 5.5 % (ref 4.8–5.6)
Mean Plasma Glucose: 111 mg/dL

## 2018-01-13 NOTE — Progress Notes (Signed)
Sister, Margaretha Sheffield called back and can bring patient any time on 01/20/18 or in afternoon on 01/19/18. Per Dr. Benay Spice, schedule her to see him 01/20/18 at 2:15 with lab prior and injection afterwards. High priority scheduling message sent w/request to call Margaretha Sheffield with the date/time [phone] work today).

## 2018-01-13 NOTE — Telephone Encounter (Signed)
Scheduled appt per 12/27 sch message - pt sister is aware of appt date and time

## 2018-01-16 ENCOUNTER — Other Ambulatory Visit: Payer: Self-pay | Admitting: Nurse Practitioner

## 2018-01-16 ENCOUNTER — Telehealth: Payer: Self-pay | Admitting: *Deleted

## 2018-01-16 DIAGNOSIS — C7951 Secondary malignant neoplasm of bone: Principal | ICD-10-CM

## 2018-01-16 DIAGNOSIS — C50919 Malignant neoplasm of unspecified site of unspecified female breast: Secondary | ICD-10-CM

## 2018-01-16 DIAGNOSIS — C50912 Malignant neoplasm of unspecified site of left female breast: Secondary | ICD-10-CM

## 2018-01-16 MED ORDER — DIAZEPAM 5 MG PO TABS: 5.0000 mg | ORAL_TABLET | Freq: Three times a day (TID) | ORAL | 0 refills | Status: DC | PRN

## 2018-01-16 MED ORDER — OXYCODONE HCL ER 20 MG PO T12A: EXTENDED_RELEASE_TABLET | ORAL | 0 refills | Status: DC

## 2018-01-16 MED ORDER — OXYCODONE-ACETAMINOPHEN 10-325 MG PO TABS: 1.0000 | ORAL_TABLET | ORAL | 0 refills | Status: DC | PRN

## 2018-01-16 MED FILL — OxyCONTIN 20 MG T12A: 20 | 30 days supply | Qty: 90 | Fill #0

## 2018-01-16 MED FILL — diazePAM 5 MG TABS: 5 | 20 days supply | Qty: 60 | Fill #0

## 2018-01-16 MED FILL — OXYCODONE-APAP 10-325: 10-325 | 30 days supply | Qty: 180 | Fill #0

## 2018-01-16 NOTE — Telephone Encounter (Signed)
Pt left message for [name], NP to escribe her pain medicine: oxycontin, percocet and diazepam to [facility]

## 2018-01-20 ENCOUNTER — Other Ambulatory Visit: Payer: Self-pay | Admitting: Oncology

## 2018-01-20 ENCOUNTER — Encounter: Payer: Self-pay | Admitting: *Deleted

## 2018-01-20 ENCOUNTER — Inpatient Hospital Stay: Payer: Medicare Other | Attending: Oncology

## 2018-01-20 ENCOUNTER — Other Ambulatory Visit: Payer: Self-pay | Admitting: *Deleted

## 2018-01-20 ENCOUNTER — Inpatient Hospital Stay (HOSPITAL_BASED_OUTPATIENT_CLINIC_OR_DEPARTMENT_OTHER): Payer: Medicare Other | Admitting: Oncology

## 2018-01-20 ENCOUNTER — Inpatient Hospital Stay: Payer: Medicare Other

## 2018-01-20 ENCOUNTER — Telehealth: Payer: Self-pay

## 2018-01-20 VITALS — BP 154/85 | HR 84 | Temp 98.9°F | Resp 17 | Ht 62.0 in | Wt 174.5 lb

## 2018-01-20 DIAGNOSIS — C787 Secondary malignant neoplasm of liver and intrahepatic bile duct: Secondary | ICD-10-CM

## 2018-01-20 DIAGNOSIS — Z5111 Encounter for antineoplastic chemotherapy: Secondary | ICD-10-CM | POA: Diagnosis present

## 2018-01-20 DIAGNOSIS — C7951 Secondary malignant neoplasm of bone: Principal | ICD-10-CM

## 2018-01-20 DIAGNOSIS — R7989 Other specified abnormal findings of blood chemistry: Secondary | ICD-10-CM | POA: Diagnosis not present

## 2018-01-20 DIAGNOSIS — D709 Neutropenia, unspecified: Secondary | ICD-10-CM | POA: Insufficient documentation

## 2018-01-20 DIAGNOSIS — I10 Essential (primary) hypertension: Secondary | ICD-10-CM | POA: Diagnosis not present

## 2018-01-20 DIAGNOSIS — M069 Rheumatoid arthritis, unspecified: Secondary | ICD-10-CM

## 2018-01-20 DIAGNOSIS — E1165 Type 2 diabetes mellitus with hyperglycemia: Secondary | ICD-10-CM | POA: Diagnosis not present

## 2018-01-20 DIAGNOSIS — C50912 Malignant neoplasm of unspecified site of left female breast: Secondary | ICD-10-CM

## 2018-01-20 DIAGNOSIS — C50919 Malignant neoplasm of unspecified site of unspecified female breast: Secondary | ICD-10-CM | POA: Diagnosis present

## 2018-01-20 DIAGNOSIS — R52 Pain, unspecified: Secondary | ICD-10-CM

## 2018-01-20 DIAGNOSIS — D696 Thrombocytopenia, unspecified: Secondary | ICD-10-CM

## 2018-01-20 LAB — CMP (CANCER CENTER ONLY)
ALT: 8 U/L (ref 0–44)
AST: 21 U/L (ref 15–41)
Albumin: 3.5 g/dL (ref 3.5–5.0)
Alkaline Phosphatase: 189 U/L — ABNORMAL HIGH (ref 38–126)
Anion gap: 13 (ref 5–15)
BILIRUBIN TOTAL: 0.6 mg/dL (ref 0.3–1.2)
BUN: 19 mg/dL (ref 8–23)
CO2: 28 mmol/L (ref 22–32)
Calcium: 9.9 mg/dL (ref 8.9–10.3)
Chloride: 93 mmol/L — ABNORMAL LOW (ref 98–111)
Creatinine: 1.73 mg/dL — ABNORMAL HIGH (ref 0.44–1.00)
GFR, Est AFR Am: 35 mL/min — ABNORMAL LOW (ref >60)
GFR, Estimated: 30 mL/min — ABNORMAL LOW (ref >60)
Glucose, Bld: 261 mg/dL — ABNORMAL HIGH (ref 70–99)
Potassium: 4 mmol/L (ref 3.5–5.1)
Sodium: 134 mmol/L — ABNORMAL LOW (ref 135–145)
TOTAL PROTEIN: 7.7 g/dL (ref 6.5–8.1)

## 2018-01-20 LAB — CBC WITH DIFFERENTIAL (CANCER CENTER ONLY)
Abs Immature Granulocytes: 0.05 10*3/uL (ref 0.00–0.07)
BASOS PCT: 0 %
Basophils Absolute: 0 10*3/uL (ref 0.0–0.1)
Eosinophils Absolute: 0.2 10*3/uL (ref 0.0–0.5)
Eosinophils Relative: 3 %
HCT: 40.9 % (ref 36.0–46.0)
Hemoglobin: 13 g/dL (ref 12.0–15.0)
Immature Granulocytes: 1 %
Lymphocytes Relative: 10 %
Lymphs Abs: 0.8 10*3/uL (ref 0.7–4.0)
MCH: 28.4 pg (ref 26.0–34.0)
MCHC: 31.8 g/dL (ref 30.0–36.0)
MCV: 89.5 fL (ref 80.0–100.0)
Monocytes Absolute: 0.5 10*3/uL (ref 0.1–1.0)
Monocytes Relative: 6 %
Neutro Abs: 6.3 10*3/uL (ref 1.7–7.7)
Neutrophils Relative %: 80 %
Platelet Count: 92 10*3/uL — ABNORMAL LOW (ref 150–400)
RBC: 4.57 MIL/uL (ref 3.87–5.11)
RDW: 15.4 % (ref 11.5–15.5)
WBC Count: 7.7 10*3/uL (ref 4.0–10.5)
nRBC: 0 % (ref 0.0–0.2)

## 2018-01-20 LAB — LIPID PANEL
CHOLESTEROL: 155 mg/dL (ref 0–200)
HDL: 93 mg/dL (ref >40)
LDL Cholesterol: 47 mg/dL (ref 0–99)
Total CHOL/HDL Ratio: 1.7 RATIO
Triglycerides: 75 mg/dL (ref <150)
VLDL: 15 mg/dL (ref 0–40)

## 2018-01-20 MED ORDER — FULVESTRANT 250 MG/5ML IM SOLN
INTRAMUSCULAR | Status: AC
  Filled 2018-01-20: qty 10

## 2018-01-20 MED ORDER — ALPELISIB (300 MG DAILY DOSE) 2 X 150 MG PO TBPK: 150.0000 mg | ORAL_TABLET | Freq: Every day | ORAL | 0 refills | Status: DC

## 2018-01-20 MED ORDER — FULVESTRANT 250 MG/5ML IM SOLN
500.0000 mg | Freq: Once | INTRAMUSCULAR | Status: AC
  Administered 2018-01-20: 500 mg via INTRAMUSCULAR

## 2018-01-20 MED ORDER — DENOSUMAB 120 MG/1.7ML ~~LOC~~ SOLN
SUBCUTANEOUS | Status: AC
  Filled 2018-01-20: qty 1.7

## 2018-01-20 NOTE — Progress Notes (Signed)
Called PCP office (Dr. Deatra Ina) to confirm proper dosing of her glipizide and was notified that last office note states she should be taking it twice daily. Requested their office draw CBC and CMP when she is there on 01/26/18 and fax results to [phone]. Faxed copy of labs on 12/26 and today with office note and med list to PCP office [phone]). Left VM for sister, Margaretha Sheffield to inform her that glipizide should be twice daily.

## 2018-01-20 NOTE — Progress Notes (Signed)
[place] OFFICE PROGRESS NOTE   Diagnosis: Cancer  INTERVAL HISTORY:   [name] returns as scheduled.  She began [place] on 01/09/2018.  Minimal diarrhea.  No rash.  Her blood sugar has been running between 250 and 350.  She saw Dr. Deatra Ina and reports she is to increase the Glucotrol to 2 pills daily.  She continues to have pain.  She takes OxyContin and oxycodone.  Objective:  Vital signs in last 24 hours:  Blood pressure (!) 154/85, pulse 84, temperature 98.9 F (37.2 C), temperature source Oral, resp. rate 17, height _0  (1.575 m), weight 174 lb 8 oz (79.2 kg), SpO2 96 %.    HEENT: No thrush or ulcers Resp: Lungs clear bilaterally Cardio: Regular rate and rhythm GI: No hepatomegaly Vascular: No leg edema Neuro: Alert, follows commands, mild confusion Skin: No rash  Portacath/PICC-without erythema  Lab Results:  Lab Results  Component Value Date   WBC 7.7 01/20/2018   HGB 13.0 01/20/2018   HCT 40.9 01/20/2018   MCV 89.5 01/20/2018   PLT 92 (L) 01/20/2018   NEUTROABS 6.3 01/20/2018    CMP  Lab Results  Component Value Date   NA 134 (L) 01/20/2018   K 4.0 01/20/2018   CL 93 (L) 01/20/2018   CO2 28 01/20/2018   GLUCOSE 261 (H) 01/20/2018   BUN 19 01/20/2018   CREATININE 1.73 (H) 01/20/2018   CALCIUM 9.9 01/20/2018   PROT 7.7 01/20/2018   ALBUMIN 3.5 01/20/2018   AST 21 01/20/2018   ALT 8 01/20/2018   ALKPHOS 189 (H) 01/20/2018   BILITOT 0.6 01/20/2018   GFRNONAA 30 (L) 01/20/2018   GFRAA 35 (L) 01/20/2018     Medications: I have reviewed the patient's current medications.   Assessment/Plan:  1. 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 ER positive, PR positive, HER-2 negative She completed 4 cycles of adjuvant AC chemotherapy June 15 through September 03, 2008, and then completed the left chest  wall radiation. She began Arimidex following an office visit on November 11, 2008.  Bone scan 06/01/2013 suggestive of thoracic spine metastases, thoracic MRI 06/27/2011 consistent with multiple bone metastases involving the thoracolumbar spine   PET scan 07/09/2013 with multiple hypermetabolic bone lesions and hypermetabolic mediastinal nodes.   Left iliac lesion biopsy 07/26/2013. Pathology showed metastatic carcinoma, ER positive, PR positive, HER-2 negative   Initiation of Faslodex 08/10/2013.  Xgeva every 3 months initiated 07/27/2013.  Restaging PET scan 01/23/2014 with no residual hypermetabolic mediastinal disease; marked improvement in the metastatic bone disease.  Restaging PET scan 02/17/2015 with recurrence of skeletal metastasis. New hypermetabolic lesions within the pelvis and ribs. No hypermetabolic lymphadenopathy.  Continuation of Faslodex; initiation of Ibrance 03/05/2015  Cycle 2 Ibrance 04/09/2015-dose reduced to 100 mg daily  Cycle 3 Ibrance 05/08/2015  Cycle 4 Ibrance 06/11/2015  Cycle 5 Ibrance 07/15/2015  PET scan 08/12/2015-increase in size and increased metabolic activity associated with bone metastases, no new lesions  Ibrance/Faslodex discontinued  Tamoxifen started 08/07/2015  PET scan 02/04/2016 with new hypermetabolic liver lesions and numerous new skeletal lesions.  Xeloda, 7 days on/7 days off initiated February 2018  Xeloda dose reduced secondary to hand/foot syndrome beginning on 05/04/2016  Restaging MRI liver 05/18/2016-2 dominant hepatic metastases measuring up to 13 mm favored to be improved.  Xeloda continued  PET scan 11/12/2016-significant improvement with complete resolution of multiple hepatic  hypermetabolic lesions and most of the prior bony hypermetabolic lesions. The remaining hypermetabolic hepatic and bony lesions are smaller and/or demonstrate significantly reduced activity.  Xeloda continued  MRI abdomen  05/16/2017-mixed response to therapy. 1 previously noted liver lesion in segment 3 completely resolved; other previously noted hepatic lesion increased significantly in size. 2 new liver lesions. Previously noted lesions in the lumbar spine similar.  Cycle 1 weekly Taxol 05/30/2017  Cycle 2 weekly Taxol 06/27/2017  Cycle 3 weekly Taxol 07/25/2017  MRI abdomen7/31/2019-3 previously noted hepatic lesions appear stable to slightly decreased in size; one new hepatic lesion measuring 1.2 x 0.9 cm. Widespread metastatic disease to the bones redemonstrated.  Taxol continued every 2 weeks  MRI abdomen 12/09/2017- liver metastases increased in size and number.  Several new pulmonary nodules at the lung bases bilaterally.  Widespread bone metastases appear stable.  Biopsy of right liver lesion 12/19/2017, metastatic adenocarcinoma, ER positive, PR positive, HER-2 negative, MSS, tumor mutation burden-8, PI K3CA alteration  Faslodex (01/06/2018)/piqray (01/09/2018) 2. Left chest subcutaneous mass noted on exam October 04, 2008, status post biopsy by Dr. Brantley Stage with a benign pathology. 3. History of delayed healing of the left mastectomy incision. 4. Rheumatoid arthritis. 5. Diabetes. 6. Hypercholesterolemia. 7. Hypertension. 8. Family history of breast cancer. She has been evaluated at the genetic screening clinic. 9. 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 10. 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 Hand/foot syndrome secondary to Xeloda-Xeloda dose reduced 05/04/2016     Disposition: She appears unchanged.  She has developed increased hyperglycemia while on piqray.  I recommended she avoid  concentrated sweets.  We will contact her primary provider regarding management of diabetes.  The creatinine is higher today.  This may be related to toxicity from piray, the quinapril, or diabetes/dehydration.  She will discontinue quinapril.  We will ask her primary office to obtain a CBC and chemistry panel when I see her next week .  Thrombocytopenia may be related to bone marrow involvement with breast cancer or medication.  We will ask for a CBC next week.  She will receive Faslodex today.  She will return for an office and lab visit in 2 weeks.  I recommended she check the blood sugar daily and call for a blood sugar of greater than 400. We reviewed her medication list with her sister.  Her sister will ensure she is taking the medications correctly. Betsy Coder, MD  01/20/2018  3:14 PM

## 2018-01-20 NOTE — Telephone Encounter (Signed)
Printed avs and calender of upcoming appointment. Per 1/3 los 

## 2018-01-20 NOTE — Patient Instructions (Signed)
Discontinue the following meds:  Accupril  Multivitamin  Crestor  Decrease your Piqray to 150 mg daily (one pill)  Follow up with your PCP on 01/26/18 and have labs drawn that day

## 2018-01-20 NOTE — Progress Notes (Signed)
Patient wants Dr. Benay Spice to take over her primary care issues if he is willing. Blood sugar readings have been in high 200's and was low 300's last night.

## 2018-01-20 NOTE — Addendum Note (Signed)
Addended by: Tania Ade on: 01/20/2018 03:42 PM   Modules accepted: Orders

## 2018-01-20 NOTE — Progress Notes (Signed)
MD holding [name] today due to increases in her Scr and glucose. Pt returning on 02/03/18.  B. Corey Skains, PharmD, BCPS, BCOP

## 2018-01-27 ENCOUNTER — Encounter: Payer: Self-pay | Admitting: *Deleted

## 2018-01-27 NOTE — Progress Notes (Unsigned)
Call to PCP office and spoke with [facility] requesting results of labs drawn there on 01/26/18. She will send message back to nurse to fax results.

## 2018-02-03 ENCOUNTER — Encounter: Payer: Self-pay | Admitting: Nurse Practitioner

## 2018-02-03 ENCOUNTER — Inpatient Hospital Stay (HOSPITAL_BASED_OUTPATIENT_CLINIC_OR_DEPARTMENT_OTHER): Payer: Medicare Other | Admitting: Nurse Practitioner

## 2018-02-03 ENCOUNTER — Inpatient Hospital Stay: Payer: Medicare Other

## 2018-02-03 ENCOUNTER — Telehealth: Payer: Self-pay | Admitting: Oncology

## 2018-02-03 VITALS — BP 136/72 | HR 97 | Temp 98.8°F | Resp 18 | Ht 62.0 in | Wt 171.0 lb

## 2018-02-03 DIAGNOSIS — Z95828 Presence of other vascular implants and grafts: Secondary | ICD-10-CM

## 2018-02-03 DIAGNOSIS — R7989 Other specified abnormal findings of blood chemistry: Secondary | ICD-10-CM

## 2018-02-03 DIAGNOSIS — C7951 Secondary malignant neoplasm of bone: Secondary | ICD-10-CM | POA: Diagnosis not present

## 2018-02-03 DIAGNOSIS — C50912 Malignant neoplasm of unspecified site of left female breast: Secondary | ICD-10-CM

## 2018-02-03 DIAGNOSIS — K1379 Other lesions of oral mucosa: Secondary | ICD-10-CM

## 2018-02-03 DIAGNOSIS — D696 Thrombocytopenia, unspecified: Secondary | ICD-10-CM | POA: Diagnosis not present

## 2018-02-03 DIAGNOSIS — D709 Neutropenia, unspecified: Secondary | ICD-10-CM

## 2018-02-03 DIAGNOSIS — C50919 Malignant neoplasm of unspecified site of unspecified female breast: Secondary | ICD-10-CM

## 2018-02-03 DIAGNOSIS — C787 Secondary malignant neoplasm of liver and intrahepatic bile duct: Secondary | ICD-10-CM

## 2018-02-03 DIAGNOSIS — I10 Essential (primary) hypertension: Secondary | ICD-10-CM

## 2018-02-03 DIAGNOSIS — Z5111 Encounter for antineoplastic chemotherapy: Secondary | ICD-10-CM | POA: Diagnosis not present

## 2018-02-03 DIAGNOSIS — E119 Type 2 diabetes mellitus without complications: Secondary | ICD-10-CM

## 2018-02-03 DIAGNOSIS — R197 Diarrhea, unspecified: Secondary | ICD-10-CM

## 2018-02-03 DIAGNOSIS — M069 Rheumatoid arthritis, unspecified: Secondary | ICD-10-CM

## 2018-02-03 LAB — CMP (CANCER CENTER ONLY)
ALK PHOS: 212 U/L — AB (ref 38–126)
ALT: 8 U/L (ref 0–44)
AST: 25 U/L (ref 15–41)
Albumin: 3.2 g/dL — ABNORMAL LOW (ref 3.5–5.0)
Anion gap: 13 (ref 5–15)
BILIRUBIN TOTAL: 0.4 mg/dL (ref 0.3–1.2)
BUN: 21 mg/dL (ref 8–23)
CALCIUM: 8.4 mg/dL — AB (ref 8.9–10.3)
CO2: 25 mmol/L (ref 22–32)
Chloride: 98 mmol/L (ref 98–111)
Creatinine: 1.53 mg/dL — ABNORMAL HIGH (ref 0.44–1.00)
GFR, Est AFR Am: 41 mL/min — ABNORMAL LOW (ref >60)
GFR, Estimated: 35 mL/min — ABNORMAL LOW (ref >60)
GLUCOSE: 181 mg/dL — AB (ref 70–99)
Potassium: 4.1 mmol/L (ref 3.5–5.1)
Sodium: 136 mmol/L (ref 135–145)
TOTAL PROTEIN: 6.9 g/dL (ref 6.5–8.1)

## 2018-02-03 LAB — CBC WITH DIFFERENTIAL (CANCER CENTER ONLY)
Abs Immature Granulocytes: 0.02 10*3/uL (ref 0.00–0.07)
BASOS ABS: 0 10*3/uL (ref 0.0–0.1)
Basophils Relative: 0 %
Eosinophils Absolute: 0.4 10*3/uL (ref 0.0–0.5)
Eosinophils Relative: 12 %
HCT: 35.1 % — ABNORMAL LOW (ref 36.0–46.0)
Hemoglobin: 11.5 g/dL — ABNORMAL LOW (ref 12.0–15.0)
Immature Granulocytes: 1 %
Lymphocytes Relative: 26 %
Lymphs Abs: 0.8 10*3/uL (ref 0.7–4.0)
MCH: 28 pg (ref 26.0–34.0)
MCHC: 32.8 g/dL (ref 30.0–36.0)
MCV: 85.6 fL (ref 80.0–100.0)
Monocytes Absolute: 0.4 10*3/uL (ref 0.1–1.0)
Monocytes Relative: 13 %
NEUTROS PCT: 48 %
Neutro Abs: 1.6 10*3/uL — ABNORMAL LOW (ref 1.7–7.7)
Platelet Count: 77 10*3/uL — ABNORMAL LOW (ref 150–400)
RBC: 4.1 MIL/uL (ref 3.87–5.11)
RDW: 16.1 % — ABNORMAL HIGH (ref 11.5–15.5)
WBC Count: 3.2 10*3/uL — ABNORMAL LOW (ref 4.0–10.5)
nRBC: 0 % (ref 0.0–0.2)

## 2018-02-03 MED ORDER — DENOSUMAB 120 MG/1.7ML ~~LOC~~ SOLN
SUBCUTANEOUS | Status: AC
  Filled 2018-02-03: qty 1.7

## 2018-02-03 MED ORDER — FULVESTRANT 250 MG/5ML IM SOLN
500.0000 mg | Freq: Once | INTRAMUSCULAR | Status: AC
  Administered 2018-02-03: 500 mg via INTRAMUSCULAR

## 2018-02-03 MED ORDER — MAGIC MOUTHWASH: 5.0000 mL | Freq: Four times a day (QID) | ORAL | 2 refills | Status: AC | PRN

## 2018-02-03 MED ORDER — SODIUM CHLORIDE 0.9% FLUSH
10.0000 mL | Freq: Once | INTRAVENOUS | Status: AC
  Administered 2018-02-03: 10 mL
  Filled 2018-02-03: qty 10

## 2018-02-03 MED ORDER — DENOSUMAB 120 MG/1.7ML ~~LOC~~ SOLN: 120.0000 mg | Freq: Once | SUBCUTANEOUS | Status: DC

## 2018-02-03 MED ORDER — HEPARIN SOD (PORK) LOCK FLUSH 100 UNIT/ML IV SOLN
500.0000 [IU] | Freq: Once | INTRAVENOUS | Status: AC
  Administered 2018-02-03: 500 [IU]
  Filled 2018-02-03: qty 5

## 2018-02-03 MED ORDER — FULVESTRANT 250 MG/5ML IM SOLN
INTRAMUSCULAR | Status: AC
  Filled 2018-02-03: qty 10

## 2018-02-03 MED FILL — MAGIC MW (NYS,BEN,MAAL): 8 days supply | Qty: 240 | Fill #0

## 2018-02-03 NOTE — Telephone Encounter (Signed)
Scheduled appt per 11/7 los - gave patient AVS and calender per los.   

## 2018-02-03 NOTE — Progress Notes (Signed)
Per Lattie Haw NP, pt to only receive faslodex today and not xgeva

## 2018-02-03 NOTE — Progress Notes (Signed)
No Xgeva today per Leonette Nutting. Faslodex only.  K. Newsome, PharmD//B. Corey Skains, PharmD, BCPS, BCOP

## 2018-02-03 NOTE — Patient Instructions (Signed)

## 2018-02-03 NOTE — Progress Notes (Addendum)
[facility] OFFICE PROGRESS NOTE   Diagnosis: Breast cancer  INTERVAL HISTORY:   [name] returns as scheduled.  She continues piqray/Faslodex.  She has mild intermittent nausea with "dry heaves".  Mouth feels sore.  She is using Magic mouthwash.  She had an episode of right jaw pain this morning when she yawned.  No pain at present.  She began having diarrhea intermittently last week.  No more than 3 loose stools a day.  She denies shortness of breath.  No cough.  No fever.  Blood sugars have ranged from 125-280.  No bleeding.  She feels her pain overall is better.  Objective:  Vital signs in last 24 hours:  Blood pressure 136/72, pulse 97, temperature 98.8 F (37.1 C), temperature source Oral, resp. rate 18, height _0  (1.575 m), weight 171 lb (77.6 kg), SpO2 97 %.    HEENT: No thrush or ulcers. Lymphatics: No palpable cervical or supraclavicular lymph nodes. Resp: Lungs clear bilaterally. Cardio: Regular rate and rhythm. GI: Abdomen soft and nontender.  No hepatomegaly. Vascular: No leg edema. Neuro: Alert and oriented. Skin: No rash.   Lab Results:  Lab Results  Component Value Date   WBC 3.2 (L) 02/03/2018   HGB 11.5 (L) 02/03/2018   HCT 35.1 (L) 02/03/2018   MCV 85.6 02/03/2018   PLT 77 (L) 02/03/2018   NEUTROABS 1.6 (L) 02/03/2018    Imaging:  No results found.  Medications: I have reviewed the patient's current medications.  Assessment/Plan: 1. 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 ER positive, PR positive, HER-2 negative She completed 4 cycles of adjuvant AC chemotherapy June 15 through September 03, 2008, and then completed the left chest wall radiation. She began Arimidex following an office visit on November 11, 2008.  Bone scan 06/01/2013 suggestive of thoracic spine metastases, thoracic MRI 06/27/2011  consistent with multiple bone metastases involving the thoracolumbar spine   PET scan 07/09/2013 with multiple hypermetabolic bone lesions and hypermetabolic mediastinal nodes.   Left iliac lesion biopsy 07/26/2013. Pathology showed metastatic carcinoma, ER positive, PR positive, HER-2 negative   Initiation of Faslodex 08/10/2013.  Xgeva every 3 months initiated 07/27/2013.  Restaging PET scan 01/23/2014 with no residual hypermetabolic mediastinal disease; marked improvement in the metastatic bone disease.  Restaging PET scan 02/17/2015 with recurrence of skeletal metastasis. New hypermetabolic lesions within the pelvis and ribs. No hypermetabolic lymphadenopathy.  Continuation of Faslodex; initiation of Ibrance 03/05/2015  Cycle 2 Ibrance 04/09/2015-dose reduced to 100 mg daily  Cycle 3 Ibrance 05/08/2015  Cycle 4 Ibrance 06/11/2015  Cycle 5 Ibrance 07/15/2015  PET scan 08/12/2015-increase in size and increased metabolic activity associated with bone metastases, no new lesions  Ibrance/Faslodex discontinued  Tamoxifen started 08/07/2015  PET scan 02/04/2016 with new hypermetabolic liver lesions and numerous new skeletal lesions.  Xeloda, 7 days on/7 days off initiated February 2018  Xeloda dose reduced secondary to hand/foot syndrome beginning on 05/04/2016  Restaging MRI liver 05/18/2016-2 dominant hepatic metastases measuring up to 13 mm favored to be improved.  Xeloda continued  PET scan 11/12/2016-significant improvement with complete resolution of multiple hepatic hypermetabolic lesions and most of the prior bony hypermetabolic lesions. The remaining hypermetabolic hepatic and bony lesions are smaller and/or demonstrate significantly reduced activity.  Xeloda continued  MRI abdomen 05/16/2017-mixed response to therapy. 1 previously noted liver lesion in segment 3 completely resolved; other previously noted  hepatic lesion increased significantly in size. 2 new  liver lesions. Previously noted lesions in the lumbar spine similar.  Cycle 1 weekly Taxol 05/30/2017  Cycle 2 weekly Taxol 06/27/2017  Cycle 3 weekly Taxol 07/25/2017  MRI abdomen7/31/2019-3 previously noted hepatic lesions appear stable to slightly decreased in size; one new hepatic lesion measuring 1.2 x 0.9 cm. Widespread metastatic disease to the bones redemonstrated.  Taxol continued every 2 weeks  MRI abdomen 12/09/2017- liver metastases increased in size and number.  Several new pulmonary nodules at the lung bases bilaterally.  Widespread bone metastases appear stable.  Biopsy of right liver lesion 12/19/2017, metastatic adenocarcinoma, ER positive, PR positive, HER-2 negative, MSS, tumor mutation burden-8, PI K3CA alteration  Faslodex (01/06/2018)/piqray (01/09/2018)  piqray dose decreased to 150 mg daily 01/20/2018 2. Left chest subcutaneous mass noted on exam October 04, 2008, status post biopsy by Dr. Brantley Stage with a benign pathology. 3. History of delayed healing of the left mastectomy incision. 4. Rheumatoid arthritis. 5. Diabetes. 6. Hypercholesterolemia. 7. Hypertension. 8. Family history of breast cancer. She has been evaluated at the genetic screening clinic. 9. 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 10. 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 appears stable.  She will continue Piqray at the current dose.  She will receive a Faslodex injection today.    We reviewed the CBC from today.  She has mild neutropenia and thrombocytopenia.  She will return for a follow-up CBC in  1 week.  She understands to contact the office with fever, chills, other signs of infection, bleeding.  She is having mouth discomfort with no evidence of mucositis on exam.  We sent a prescription to her pharmacy for Magic mouthwash.  She is having intermittent loose stools possibly related to the [place].  She will continue Imodium.  She understands to contact the office with worsening/poorly controlled diarrhea.  Creatinine is mildly elevated.  We will hold Xgeva today.  She will return for repeat labs in 1 week.  We will see her in follow-up in 2 weeks.  She will contact the office in the interim as outlined above or with any other problems.  Patient seen with Dr. Benay Spice.      [name] ANP/GNP-BC   02/03/2018  2:34 PM  This was a shared visit with [name].  [name] is tolerating the piqray well, but she is developing hematologic toxicity.  She will continue the current dose and return for a CBC next week.  Her pain and overall performance status appear improved.  [name], MD

## 2018-02-09 ENCOUNTER — Inpatient Hospital Stay: Payer: Medicare Other

## 2018-02-09 DIAGNOSIS — Z5111 Encounter for antineoplastic chemotherapy: Secondary | ICD-10-CM | POA: Diagnosis not present

## 2018-02-09 DIAGNOSIS — C50912 Malignant neoplasm of unspecified site of left female breast: Secondary | ICD-10-CM

## 2018-02-09 DIAGNOSIS — C7951 Secondary malignant neoplasm of bone: Principal | ICD-10-CM

## 2018-02-09 DIAGNOSIS — Z95828 Presence of other vascular implants and grafts: Secondary | ICD-10-CM

## 2018-02-09 LAB — CBC WITH DIFFERENTIAL (CANCER CENTER ONLY)
Abs Immature Granulocytes: 0.05 10*3/uL (ref 0.00–0.07)
Basophils Absolute: 0 10*3/uL (ref 0.0–0.1)
Basophils Relative: 1 %
Eosinophils Absolute: 0.6 10*3/uL — ABNORMAL HIGH (ref 0.0–0.5)
Eosinophils Relative: 9 %
HCT: 33.9 % — ABNORMAL LOW (ref 36.0–46.0)
Hemoglobin: 11 g/dL — ABNORMAL LOW (ref 12.0–15.0)
Immature Granulocytes: 1 %
LYMPHS ABS: 1.4 10*3/uL (ref 0.7–4.0)
Lymphocytes Relative: 20 %
MCH: 27.6 pg (ref 26.0–34.0)
MCHC: 32.4 g/dL (ref 30.0–36.0)
MCV: 85 fL (ref 80.0–100.0)
Monocytes Absolute: 1.2 10*3/uL — ABNORMAL HIGH (ref 0.1–1.0)
Monocytes Relative: 17 %
NEUTROS ABS: 3.4 10*3/uL (ref 1.7–7.7)
Neutrophils Relative %: 52 %
Platelet Count: 158 10*3/uL (ref 150–400)
RBC: 3.99 MIL/uL (ref 3.87–5.11)
RDW: 16.1 % — ABNORMAL HIGH (ref 11.5–15.5)
WBC Count: 6.6 10*3/uL (ref 4.0–10.5)
nRBC: 0 % (ref 0.0–0.2)

## 2018-02-09 LAB — CMP (CANCER CENTER ONLY)
ALT: 6 U/L (ref 0–44)
AST: 19 U/L (ref 15–41)
Albumin: 3 g/dL — ABNORMAL LOW (ref 3.5–5.0)
Alkaline Phosphatase: 189 U/L — ABNORMAL HIGH (ref 38–126)
Anion gap: 12 (ref 5–15)
BUN: 24 mg/dL — ABNORMAL HIGH (ref 8–23)
CHLORIDE: 99 mmol/L (ref 98–111)
CO2: 25 mmol/L (ref 22–32)
Calcium: 8.7 mg/dL — ABNORMAL LOW (ref 8.9–10.3)
Creatinine: 1.66 mg/dL — ABNORMAL HIGH (ref 0.44–1.00)
GFR, Est AFR Am: 37 mL/min — ABNORMAL LOW (ref >60)
GFR, Estimated: 32 mL/min — ABNORMAL LOW (ref >60)
Glucose, Bld: 68 mg/dL — ABNORMAL LOW (ref 70–99)
Potassium: 3.8 mmol/L (ref 3.5–5.1)
Sodium: 136 mmol/L (ref 135–145)
Total Bilirubin: 0.3 mg/dL (ref 0.3–1.2)
Total Protein: 6.8 g/dL (ref 6.5–8.1)

## 2018-02-09 MED ORDER — HEPARIN SOD (PORK) LOCK FLUSH 100 UNIT/ML IV SOLN
250.0000 [IU] | Freq: Once | INTRAVENOUS | Status: AC
  Administered 2018-02-09: 250 [IU]
  Filled 2018-02-09: qty 5

## 2018-02-09 MED ORDER — SODIUM CHLORIDE 0.9% FLUSH
10.0000 mL | Freq: Once | INTRAVENOUS | Status: AC
  Administered 2018-02-09: 10 mL
  Filled 2018-02-09: qty 10

## 2018-02-13 ENCOUNTER — Other Ambulatory Visit: Payer: Self-pay | Admitting: Nurse Practitioner

## 2018-02-13 ENCOUNTER — Telehealth: Payer: Self-pay | Admitting: *Deleted

## 2018-02-13 DIAGNOSIS — C7951 Secondary malignant neoplasm of bone: Principal | ICD-10-CM

## 2018-02-13 DIAGNOSIS — C50919 Malignant neoplasm of unspecified site of unspecified female breast: Secondary | ICD-10-CM

## 2018-02-13 DIAGNOSIS — C50912 Malignant neoplasm of unspecified site of left female breast: Secondary | ICD-10-CM

## 2018-02-13 MED ORDER — OXYCODONE-ACETAMINOPHEN 10-325 MG PO TABS: 1.0000 | ORAL_TABLET | ORAL | 0 refills | Status: DC | PRN

## 2018-02-13 MED ORDER — OXYCODONE HCL ER 20 MG PO T12A: EXTENDED_RELEASE_TABLET | ORAL | 0 refills | Status: DC

## 2018-02-13 MED FILL — OxyCONTIN 20 MG T12A: 20 | 30 days supply | Qty: 90 | Fill #0

## 2018-02-13 MED FILL — OXYCODONE-APAP 10-325: 10-325 | 30 days supply | Qty: 180 | Fill #0

## 2018-02-13 NOTE — Telephone Encounter (Signed)
Called to request lab results from 02/09/18. Her phone is out of order-call results to her sister, Margaretha Sheffield at [phone]. Left VM for Margaretha Sheffield with CBC results and chemistry panel results. Will call them if MD feels there is any change in treatment plan or her management based on the labs.

## 2018-02-14 ENCOUNTER — Other Ambulatory Visit: Payer: Self-pay | Admitting: Nurse Practitioner

## 2018-02-14 ENCOUNTER — Other Ambulatory Visit: Payer: Self-pay | Admitting: Oncology

## 2018-02-14 DIAGNOSIS — C7951 Secondary malignant neoplasm of bone: Principal | ICD-10-CM

## 2018-02-14 DIAGNOSIS — C50912 Malignant neoplasm of unspecified site of left female breast: Secondary | ICD-10-CM

## 2018-02-14 MED ORDER — DIAZEPAM 5 MG PO TABS: 5.0000 mg | ORAL_TABLET | Freq: Three times a day (TID) | ORAL | 0 refills | Status: DC | PRN

## 2018-02-14 NOTE — Telephone Encounter (Signed)
Pt. Has an appt. On 02/17/18 per Lattie Haw will refill after appointment

## 2018-02-14 NOTE — Telephone Encounter (Signed)
Given to [place] to refill

## 2018-02-17 ENCOUNTER — Other Ambulatory Visit: Payer: Self-pay | Admitting: *Deleted

## 2018-02-17 ENCOUNTER — Telehealth: Payer: Self-pay | Admitting: Nurse Practitioner

## 2018-02-17 ENCOUNTER — Inpatient Hospital Stay (HOSPITAL_BASED_OUTPATIENT_CLINIC_OR_DEPARTMENT_OTHER): Payer: Medicare Other | Admitting: Nurse Practitioner

## 2018-02-17 ENCOUNTER — Inpatient Hospital Stay: Payer: Medicare Other

## 2018-02-17 ENCOUNTER — Encounter: Payer: Self-pay | Admitting: Nurse Practitioner

## 2018-02-17 VITALS — BP 134/56 | HR 83 | Temp 98.2°F | Resp 18

## 2018-02-17 VITALS — BP 147/67 | HR 80 | Temp 97.6°F | Resp 18 | Ht 62.0 in | Wt 171.5 lb

## 2018-02-17 DIAGNOSIS — C7951 Secondary malignant neoplasm of bone: Principal | ICD-10-CM

## 2018-02-17 DIAGNOSIS — R197 Diarrhea, unspecified: Secondary | ICD-10-CM

## 2018-02-17 DIAGNOSIS — M069 Rheumatoid arthritis, unspecified: Secondary | ICD-10-CM

## 2018-02-17 DIAGNOSIS — E119 Type 2 diabetes mellitus without complications: Secondary | ICD-10-CM

## 2018-02-17 DIAGNOSIS — C50919 Malignant neoplasm of unspecified site of unspecified female breast: Secondary | ICD-10-CM

## 2018-02-17 DIAGNOSIS — Z5111 Encounter for antineoplastic chemotherapy: Secondary | ICD-10-CM | POA: Diagnosis not present

## 2018-02-17 DIAGNOSIS — C787 Secondary malignant neoplasm of liver and intrahepatic bile duct: Secondary | ICD-10-CM | POA: Diagnosis not present

## 2018-02-17 DIAGNOSIS — R0781 Pleurodynia: Secondary | ICD-10-CM | POA: Diagnosis not present

## 2018-02-17 DIAGNOSIS — C50912 Malignant neoplasm of unspecified site of left female breast: Secondary | ICD-10-CM

## 2018-02-17 DIAGNOSIS — I10 Essential (primary) hypertension: Secondary | ICD-10-CM

## 2018-02-17 DIAGNOSIS — R110 Nausea: Secondary | ICD-10-CM

## 2018-02-17 DIAGNOSIS — Z95828 Presence of other vascular implants and grafts: Secondary | ICD-10-CM

## 2018-02-17 LAB — CBC WITH DIFFERENTIAL (CANCER CENTER ONLY)
Abs Immature Granulocytes: 0.14 10*3/uL — ABNORMAL HIGH (ref 0.00–0.07)
Basophils Absolute: 0 10*3/uL (ref 0.0–0.1)
Basophils Relative: 0 %
EOS ABS: 0.6 10*3/uL — AB (ref 0.0–0.5)
EOS PCT: 7 %
HCT: 34 % — ABNORMAL LOW (ref 36.0–46.0)
Hemoglobin: 10.8 g/dL — ABNORMAL LOW (ref 12.0–15.0)
Immature Granulocytes: 2 %
LYMPHS ABS: 1.2 10*3/uL (ref 0.7–4.0)
Lymphocytes Relative: 13 %
MCH: 27.5 pg (ref 26.0–34.0)
MCHC: 31.8 g/dL (ref 30.0–36.0)
MCV: 86.5 fL (ref 80.0–100.0)
Monocytes Absolute: 0.6 10*3/uL (ref 0.1–1.0)
Monocytes Relative: 6 %
Neutro Abs: 6.8 10*3/uL (ref 1.7–7.7)
Neutrophils Relative %: 72 %
Platelet Count: 136 10*3/uL — ABNORMAL LOW (ref 150–400)
RBC: 3.93 MIL/uL (ref 3.87–5.11)
RDW: 16.8 % — ABNORMAL HIGH (ref 11.5–15.5)
WBC Count: 9.4 10*3/uL (ref 4.0–10.5)
nRBC: 0 % (ref 0.0–0.2)

## 2018-02-17 LAB — CMP (CANCER CENTER ONLY)
ALT: 6 U/L (ref 0–44)
AST: 23 U/L (ref 15–41)
Albumin: 2.9 g/dL — ABNORMAL LOW (ref 3.5–5.0)
Alkaline Phosphatase: 208 U/L — ABNORMAL HIGH (ref 38–126)
Anion gap: 13 (ref 5–15)
BUN: 19 mg/dL (ref 8–23)
CALCIUM: 8.2 mg/dL — AB (ref 8.9–10.3)
CO2: 23 mmol/L (ref 22–32)
CREATININE: 1.3 mg/dL — AB (ref 0.44–1.00)
Chloride: 103 mmol/L (ref 98–111)
GFR, EST AFRICAN AMERICAN: 49 mL/min — AB (ref >60)
GFR, Estimated: 42 mL/min — ABNORMAL LOW (ref >60)
Glucose, Bld: 160 mg/dL — ABNORMAL HIGH (ref 70–99)
Potassium: 3.9 mmol/L (ref 3.5–5.1)
SODIUM: 139 mmol/L (ref 135–145)
Total Bilirubin: 0.3 mg/dL (ref 0.3–1.2)
Total Protein: 6.4 g/dL — ABNORMAL LOW (ref 6.5–8.1)

## 2018-02-17 MED ORDER — DENOSUMAB 120 MG/1.7ML ~~LOC~~ SOLN
120.0000 mg | Freq: Once | SUBCUTANEOUS | Status: AC
  Administered 2018-02-17: 120 mg via SUBCUTANEOUS

## 2018-02-17 MED ORDER — SODIUM CHLORIDE 0.9% FLUSH
10.0000 mL | Freq: Once | INTRAVENOUS | Status: AC
  Administered 2018-02-17: 10 mL
  Filled 2018-02-17: qty 10

## 2018-02-17 MED ORDER — ALPELISIB (300 MG DAILY DOSE) 2 X 150 MG PO TBPK: ORAL_TABLET | ORAL | 0 refills | Status: AC

## 2018-02-17 MED ORDER — HEPARIN SOD (PORK) LOCK FLUSH 100 UNIT/ML IV SOLN
500.0000 [IU] | Freq: Once | INTRAVENOUS | Status: AC
  Administered 2018-02-17: 500 [IU]
  Filled 2018-02-17: qty 5

## 2018-02-17 MED FILL — diazePAM 5 MG TABS: 5 | 20 days supply | Qty: 60 | Fill #0

## 2018-02-17 NOTE — Telephone Encounter (Signed)
Scheduled appt per 01/31 los. ° °Printed calendar and avs. °

## 2018-02-17 NOTE — Patient Instructions (Signed)

## 2018-02-17 NOTE — Patient Instructions (Signed)

## 2018-02-17 NOTE — Progress Notes (Signed)
Per Lattie Haw only injection to be given today is xgeva, no faslodex

## 2018-02-17 NOTE — Progress Notes (Signed)
[facility] OFFICE PROGRESS NOTE   Diagnosis: Breast cancer  INTERVAL HISTORY:   [name] returns as scheduled.  She continues piqray/Faslodex.  She has intermittent nausea with dry heaves.  Some burning in the mouth.  No ulcers.  She is having intermittent diarrhea relieved with Imodium.  She is tired today.  Blood sugars ranging  108-167.  She is trying to eat small frequent meals.  She is drinking nutritional supplements.  She reports her weight is stable.  Some increase in pain at the right ribs, otherwise pain is stable.  She continues OxyContin with Percocet as needed.  Objective:  Vital signs in last 24 hours:  Blood pressure (!) 147/67, pulse 80, temperature 97.6 F (36.4 C), temperature source Oral, resp. rate 18, height '5\' 2"'  (1.575 m), weight 171 lb 8 oz (77.8 kg), SpO2 100 %.    HEENT: No thrush or ulcers.   Resp: Lungs clear bilaterally. Cardio: Regular rate and rhythm. GI: Abdomen soft and nontender.  No hepatomegaly. Vascular: No leg edema.  Skin: A few small erythematous macular lesions skin just superior to the upper lip. Port-A-Cath without erythema.  Lab Results:  Lab Results  Component Value Date   WBC 9.4 02/17/2018   HGB 10.8 (L) 02/17/2018   HCT 34.0 (L) 02/17/2018   MCV 86.5 02/17/2018   PLT 136 (L) 02/17/2018   NEUTROABS 6.8 02/17/2018    Imaging:  No results found.  Medications: I have reviewed the patient's current medications.  Assessment/Plan: 1. 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 ER positive, PR positive, HER-2 negative She completed 4 cycles of adjuvant AC chemotherapy June 15 through September 03, 2008, and then completed the left chest wall radiation. She began Arimidex following an office visit on November 11, 2008.  Bone scan 06/01/2013 suggestive of thoracic spine metastases,  thoracic MRI 06/27/2011 consistent with multiple bone metastases involving the thoracolumbar spine   PET scan 07/09/2013 with multiple hypermetabolic bone lesions and hypermetabolic mediastinal nodes.   Left iliac lesion biopsy 07/26/2013. Pathology showed metastatic carcinoma, ER positive, PR positive, HER-2 negative   Initiation of Faslodex 08/10/2013.  Xgeva every 3 months initiated 07/27/2013.  Restaging PET scan 01/23/2014 with no residual hypermetabolic mediastinal disease; marked improvement in the metastatic bone disease.  Restaging PET scan 02/17/2015 with recurrence of skeletal metastasis. New hypermetabolic lesions within the pelvis and ribs. No hypermetabolic lymphadenopathy.  Continuation of Faslodex; initiation of Ibrance 03/05/2015  Cycle 2 Ibrance 04/09/2015-dose reduced to 100 mg daily  Cycle 3 Ibrance 05/08/2015  Cycle 4 Ibrance 06/11/2015  Cycle 5 Ibrance 07/15/2015  PET scan 08/12/2015-increase in size and increased metabolic activity associated with bone metastases, no new lesions  Ibrance/Faslodex discontinued  Tamoxifen started 08/07/2015  PET scan 02/04/2016 with new hypermetabolic liver lesions and numerous new skeletal lesions.  Xeloda, 7 days on/7 days off initiated February 2018  Xeloda dose reduced secondary to hand/foot syndrome beginning on 05/04/2016  Restaging MRI liver 05/18/2016-2 dominant hepatic metastases measuring up to 13 mm favored to be improved.  Xeloda continued  PET scan 11/12/2016-significant improvement with complete resolution of multiple hepatic hypermetabolic lesions and most of the prior bony hypermetabolic lesions. The remaining hypermetabolic hepatic and bony lesions are smaller and/or demonstrate significantly reduced activity.  Xeloda continued  MRI abdomen 05/16/2017-mixed response to therapy. 1 previously noted liver lesion in segment 3 completely resolved; other previously noted  hepatic lesion increased  significantly in size. 2 new liver lesions. Previously noted lesions in the lumbar spine similar.  Cycle 1 weekly Taxol 05/30/2017  Cycle 2 weekly Taxol 06/27/2017  Cycle 3 weekly Taxol 07/25/2017  MRI abdomen7/31/2019-3 previously noted hepatic lesions appear stable to slightly decreased in size; one new hepatic lesion measuring 1.2 x 0.9 cm. Widespread metastatic disease to the bones redemonstrated.  Taxol continued every 2 weeks  MRI abdomen 12/09/2017- liver metastases increased in size and number. Several new pulmonary nodules at the lung bases bilaterally. Widespread bone metastases appear stable.  Biopsy of right liver lesion 12/19/2017, metastatic adenocarcinoma, ER positive, PR positive, HER-2 negative, MSS, tumor mutation burden-8, PI K3CA alteration  Faslodex (01/06/2018)/piqray(01/09/2018)  piqray dose decreased to 150 mg daily 01/20/2018 2. Left chest subcutaneous mass noted on exam October 04, 2008, status post biopsy by Dr. Brantley Stage with a benign pathology. 3. History of delayed healing of the left mastectomy incision. 4. Rheumatoid arthritis. 5. Diabetes. 6. Hypercholesterolemia. 7. Hypertension. 8. Family history of breast cancer. She has been evaluated at the genetic screening clinic. 9. 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 10. 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 Hand/foot syndrome secondary to Xeloda-Xeloda dose reduced 05/04/2016   Disposition: [name] appears stable.  We reviewed the CBC from today.  Blood counts have improved since the piqray dose reduction. She will continue Piqray at the current dose of 150 mg daily.  She notes improved control of  blood sugars.  She will continue to monitor closely.  Today's creatinine is better.  She will receive Xgeva today as planned.  She will return for lab, follow-up and Faslodex in 2 weeks.  She will contact the office in the interim with any problems.  Plan reviewed with Dr. Benay Spice.  25 minutes were spent face-to-face at today's visit with the majority of that time involved in counseling/coordination of care.    [name] ANP/GNP-BC   02/17/2018  9:16 AM

## 2018-02-18 LAB — CANCER ANTIGEN 27.29: CA 27.29: 199.7 U/mL — ABNORMAL HIGH (ref 0.0–38.6)

## 2018-02-24 MED FILL — PIQRAY 300MG DAILY DOSE 2X1: 2 X 150 | 56 days supply | Qty: 56 | Fill #0

## 2018-03-03 ENCOUNTER — Inpatient Hospital Stay: Payer: Medicare Other

## 2018-03-03 ENCOUNTER — Inpatient Hospital Stay: Payer: Medicare Other | Attending: Oncology

## 2018-03-03 ENCOUNTER — Inpatient Hospital Stay (HOSPITAL_BASED_OUTPATIENT_CLINIC_OR_DEPARTMENT_OTHER): Payer: Medicare Other | Admitting: Nurse Practitioner

## 2018-03-03 ENCOUNTER — Encounter: Payer: Self-pay | Admitting: Nurse Practitioner

## 2018-03-03 ENCOUNTER — Telehealth: Payer: Self-pay

## 2018-03-03 VITALS — BP 159/78 | HR 79 | Temp 98.2°F | Resp 18 | Ht 62.0 in | Wt 171.6 lb

## 2018-03-03 DIAGNOSIS — Z95828 Presence of other vascular implants and grafts: Secondary | ICD-10-CM

## 2018-03-03 DIAGNOSIS — C50912 Malignant neoplasm of unspecified site of left female breast: Secondary | ICD-10-CM

## 2018-03-03 DIAGNOSIS — C773 Secondary and unspecified malignant neoplasm of axilla and upper limb lymph nodes: Secondary | ICD-10-CM

## 2018-03-03 DIAGNOSIS — E119 Type 2 diabetes mellitus without complications: Secondary | ICD-10-CM

## 2018-03-03 DIAGNOSIS — C7951 Secondary malignant neoplasm of bone: Secondary | ICD-10-CM | POA: Diagnosis present

## 2018-03-03 DIAGNOSIS — Z5111 Encounter for antineoplastic chemotherapy: Secondary | ICD-10-CM | POA: Insufficient documentation

## 2018-03-03 DIAGNOSIS — M069 Rheumatoid arthritis, unspecified: Secondary | ICD-10-CM

## 2018-03-03 DIAGNOSIS — C787 Secondary malignant neoplasm of liver and intrahepatic bile duct: Secondary | ICD-10-CM | POA: Insufficient documentation

## 2018-03-03 DIAGNOSIS — C50919 Malignant neoplasm of unspecified site of unspecified female breast: Secondary | ICD-10-CM

## 2018-03-03 DIAGNOSIS — R21 Rash and other nonspecific skin eruption: Secondary | ICD-10-CM

## 2018-03-03 DIAGNOSIS — I10 Essential (primary) hypertension: Secondary | ICD-10-CM

## 2018-03-03 LAB — CBC WITH DIFFERENTIAL (CANCER CENTER ONLY)
Abs Immature Granulocytes: 0.05 10*3/uL (ref 0.00–0.07)
Basophils Absolute: 0 10*3/uL (ref 0.0–0.1)
Basophils Relative: 0 %
Eosinophils Absolute: 0.5 10*3/uL (ref 0.0–0.5)
Eosinophils Relative: 7 %
HEMATOCRIT: 34.3 % — AB (ref 36.0–46.0)
Hemoglobin: 11 g/dL — ABNORMAL LOW (ref 12.0–15.0)
IMMATURE GRANULOCYTES: 1 %
LYMPHS ABS: 1.1 10*3/uL (ref 0.7–4.0)
Lymphocytes Relative: 18 %
MCH: 28 pg (ref 26.0–34.0)
MCHC: 32.1 g/dL (ref 30.0–36.0)
MCV: 87.3 fL (ref 80.0–100.0)
Monocytes Absolute: 0.4 10*3/uL (ref 0.1–1.0)
Monocytes Relative: 7 %
Neutro Abs: 4.4 10*3/uL (ref 1.7–7.7)
Neutrophils Relative %: 67 %
Platelet Count: 175 10*3/uL (ref 150–400)
RBC: 3.93 MIL/uL (ref 3.87–5.11)
RDW: 18.4 % — ABNORMAL HIGH (ref 11.5–15.5)
WBC Count: 6.5 10*3/uL (ref 4.0–10.5)
nRBC: 0 % (ref 0.0–0.2)

## 2018-03-03 LAB — CMP (CANCER CENTER ONLY)
ALT: 8 U/L (ref 0–44)
AST: 31 U/L (ref 15–41)
Albumin: 3.1 g/dL — ABNORMAL LOW (ref 3.5–5.0)
Alkaline Phosphatase: 269 U/L — ABNORMAL HIGH (ref 38–126)
Anion gap: 13 (ref 5–15)
BUN: 18 mg/dL (ref 8–23)
CO2: 22 mmol/L (ref 22–32)
Calcium: 8.6 mg/dL — ABNORMAL LOW (ref 8.9–10.3)
Chloride: 103 mmol/L (ref 98–111)
Creatinine: 1.31 mg/dL — ABNORMAL HIGH (ref 0.44–1.00)
GFR, Est AFR Am: 49 mL/min — ABNORMAL LOW (ref >60)
GFR, Estimated: 42 mL/min — ABNORMAL LOW (ref >60)
Glucose, Bld: 146 mg/dL — ABNORMAL HIGH (ref 70–99)
Potassium: 4.2 mmol/L (ref 3.5–5.1)
Sodium: 138 mmol/L (ref 135–145)
Total Bilirubin: 0.4 mg/dL (ref 0.3–1.2)
Total Protein: 6.8 g/dL (ref 6.5–8.1)

## 2018-03-03 MED ORDER — SODIUM CHLORIDE 0.9% FLUSH
10.0000 mL | Freq: Once | INTRAVENOUS | Status: AC
  Administered 2018-03-03: 10 mL
  Filled 2018-03-03: qty 10

## 2018-03-03 MED ORDER — HEPARIN SOD (PORK) LOCK FLUSH 100 UNIT/ML IV SOLN
500.0000 [IU] | Freq: Once | INTRAVENOUS | Status: AC
  Administered 2018-03-03: 500 [IU]
  Filled 2018-03-03: qty 5

## 2018-03-03 MED ORDER — FULVESTRANT 250 MG/5ML IM SOLN
500.0000 mg | Freq: Once | INTRAMUSCULAR | Status: AC
  Administered 2018-03-03: 500 mg via INTRAMUSCULAR

## 2018-03-03 MED ORDER — FULVESTRANT 250 MG/5ML IM SOLN
INTRAMUSCULAR | Status: AC
  Filled 2018-03-03: qty 10

## 2018-03-03 NOTE — Patient Instructions (Signed)

## 2018-03-03 NOTE — Telephone Encounter (Signed)
Printed avs and calender of upcoming appointment. Per 2/14 los 

## 2018-03-03 NOTE — Progress Notes (Addendum)
[place] OFFICE PROGRESS NOTE   Diagnosis: Breast cancer  INTERVAL HISTORY:   [name] returns as scheduled.  She continues piqray/Faslodex.  She has had a few episodes of "dry heaves".  Diarrhea is controlled with Imodium.  The rash around her mouth has increased and "burns".  She has also noted the rash on her face.  No associated pruritus.  Mouth is sore.  No ulcers.  Pain overall is unchanged.  Blood sugars typically run 1 16-1 25.  Objective:  Vital signs in last 24 hours:  Blood pressure (!) 159/78, pulse 79, temperature 98.2 F (36.8 C), temperature source Oral, resp. rate 18, height 5' 2" (1.575 m), weight 171 lb 9.6 oz (77.8 kg), SpO2 97 %.    HEENT: No obvious thrush or ulcers (evaluation limited due to difficulty opening her mouth wide) Resp: Lungs clear bilaterally. Cardio: Regular rate and rhythm. GI: Abdomen soft and nontender.  No hepatomegaly. Vascular: No leg edema  Skin: Erythematous macular rash perioral distribution.  Erythematous mildly excoriated rash right face; small area of erythema with central small scabbed lesion left face. Port-A-Cath without erythema.  Lab Results:  Lab Results  Component Value Date   WBC 6.5 03/03/2018   HGB 11.0 (L) 03/03/2018   HCT 34.3 (L) 03/03/2018   MCV 87.3 03/03/2018   PLT 175 03/03/2018   NEUTROABS 4.4 03/03/2018    Imaging:  No results found.  Medications: I have reviewed the patient's current medications.  Assessment/Plan: 1. 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 ER positive, PR positive, HER-2 negative She completed 4 cycles of adjuvant AC chemotherapy June 15 through September 03, 2008, and then completed the left chest wall radiation. She began Arimidex following an office visit on November 11, 2008.  Bone scan 06/01/2013 suggestive of thoracic spine  metastases, thoracic MRI 06/27/2011 consistent with multiple bone metastases involving the thoracolumbar spine   PET scan 07/09/2013 with multiple hypermetabolic bone lesions and hypermetabolic mediastinal nodes.   Left iliac lesion biopsy 07/26/2013. Pathology showed metastatic carcinoma, ER positive, PR positive, HER-2 negative   Initiation of Faslodex 08/10/2013.  Xgeva every 3 months initiated 07/27/2013.  Restaging PET scan 01/23/2014 with no residual hypermetabolic mediastinal disease; marked improvement in the metastatic bone disease.  Restaging PET scan 02/17/2015 with recurrence of skeletal metastasis. New hypermetabolic lesions within the pelvis and ribs. No hypermetabolic lymphadenopathy.  Continuation of Faslodex; initiation of Ibrance 03/05/2015  Cycle 2 Ibrance 04/09/2015-dose reduced to 100 mg daily  Cycle 3 Ibrance 05/08/2015  Cycle 4 Ibrance 06/11/2015  Cycle 5 Ibrance 07/15/2015  PET scan 08/12/2015-increase in size and increased metabolic activity associated with bone metastases, no new lesions  Ibrance/Faslodex discontinued  Tamoxifen started 08/07/2015  PET scan 02/04/2016 with new hypermetabolic liver lesions and numerous new skeletal lesions.  Xeloda, 7 days on/7 days off initiated February 2018  Xeloda dose reduced secondary to hand/foot syndrome beginning on 05/04/2016  Restaging MRI liver 05/18/2016-2 dominant hepatic metastases measuring up to 13 mm favored to be improved.  Xeloda continued  PET scan 11/12/2016-significant improvement with complete resolution of multiple hepatic hypermetabolic lesions and most of the prior bony hypermetabolic lesions. The remaining hypermetabolic hepatic and bony lesions are smaller and/or demonstrate significantly reduced activity.  Xeloda continued  MRI abdomen 05/16/2017-mixed response to therapy. 1 previously noted liver lesion in segment 3 completely resolved; other previously noted hepatic lesion  increased significantly in size. 2 new liver lesions. Previously noted lesions in the lumbar spine similar.  Cycle 1 weekly Taxol 05/30/2017  Cycle 2 weekly Taxol 06/27/2017  Cycle 3 weekly Taxol 07/25/2017  MRI abdomen7/31/2019-3 previously noted hepatic lesions appear stable to slightly decreased in size; one new hepatic lesion measuring 1.2 x 0.9 cm. Widespread metastatic disease to the bones redemonstrated.  Taxol continued every 2 weeks  MRI abdomen 12/09/2017-liver metastases increased in size and number. Several new pulmonary nodules at the lung bases bilaterally. Widespread bone metastases appear stable.  Biopsy of right liver lesion 12/19/2017, metastatic adenocarcinoma, ER positive, PR positive, HER-2 negative, MSS, tumor mutation burden-8, PI K3CA alteration  Faslodex (01/06/2018)/piqray(01/09/2018)  piqraydose decreased to 150 mg daily 01/20/2018 2. Left chest subcutaneous mass noted on exam October 04, 2008, status post biopsy by Dr. Cornett with a benign pathology. 3. History of delayed healing of the left mastectomy incision. 4. Rheumatoid arthritis. 5. Diabetes. 6. Hypercholesterolemia. 7. Hypertension. 8. Family history of breast cancer. She has been evaluated at the genetic screening clinic. 9. 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 10. 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  She continues Piqray/Faslodex.  She is scheduled to receive a Faslodex injection today.  She will undergo a restaging MRI of the liver prior to her next office visit in 4  weeks.  The perioral/facial rash is likely related to Piqray.  She will continue Zyrtec and begin over-the-counter topical hydrocortisone.  She understands to contact the office if the rash progresses.  We reviewed the CBC from today.  Blood counts remain stable.   She will return for labs in 2 weeks.  She will return for lab and follow-up in 4 weeks with an MRI of the liver a few days prior.  She will contact the office in the interim as outlined above or with any other problems.  Patient seen with Dr. Sherrill.    Lisa Thomas ANP/GNP-BC   03/03/2018  2:37 PM  This was a visit with Lisa Thomas.  Ms. Brisendine was interviewed and examined.  The periorbital lash facial rash is likely related to the piqray.  She is otherwise tolerating the treatment well.  She will undergo a restaging MRI of the liver prior to an office visit in 4 weeks.  Brad Sherrill, MD      

## 2018-03-13 ENCOUNTER — Other Ambulatory Visit: Payer: Self-pay | Admitting: Medical Oncology

## 2018-03-13 ENCOUNTER — Telehealth: Payer: Self-pay | Admitting: *Deleted

## 2018-03-13 ENCOUNTER — Telehealth: Payer: Self-pay | Admitting: Medical Oncology

## 2018-03-13 NOTE — Telephone Encounter (Signed)
Pain med refill and valium refill.

## 2018-03-13 NOTE — Telephone Encounter (Deleted)
Refill -Percocet, Valium and Oxycontin.

## 2018-03-13 NOTE — Telephone Encounter (Signed)
Reports sharp pain with a "popping" sensation in her left hip when she is in bed and leans forward to get up. Pain is sudden and sharp (rated 7/10) and then stops. Sudden onset as of 03/02/18. Does not hurt to ambulate on the leg. Wanted MD aware.

## 2018-03-13 NOTE — Telephone Encounter (Signed)
err

## 2018-03-14 ENCOUNTER — Other Ambulatory Visit: Payer: Self-pay | Admitting: Nurse Practitioner

## 2018-03-14 DIAGNOSIS — C50919 Malignant neoplasm of unspecified site of unspecified female breast: Secondary | ICD-10-CM

## 2018-03-14 DIAGNOSIS — C50912 Malignant neoplasm of unspecified site of left female breast: Secondary | ICD-10-CM

## 2018-03-14 DIAGNOSIS — C7951 Secondary malignant neoplasm of bone: Principal | ICD-10-CM

## 2018-03-14 MED ORDER — OXYCODONE HCL ER 20 MG PO T12A: EXTENDED_RELEASE_TABLET | ORAL | 0 refills | Status: AC

## 2018-03-14 MED ORDER — OXYCODONE-ACETAMINOPHEN 10-325 MG PO TABS: 1.0000 | ORAL_TABLET | ORAL | 0 refills | Status: AC | PRN

## 2018-03-14 MED ORDER — DIAZEPAM 5 MG PO TABS: 5.0000 mg | ORAL_TABLET | Freq: Three times a day (TID) | ORAL | 0 refills | Status: AC | PRN

## 2018-03-14 MED FILL — OXYCODONE-APAP 10-325: 10-325 | 30 days supply | Qty: 180 | Fill #0

## 2018-03-14 MED FILL — diazePAM 5 MG TABS: 5 | 20 days supply | Qty: 60 | Fill #0

## 2018-03-14 MED FILL — OxyCONTIN 20 MG T12A: 20 | 30 days supply | Qty: 90 | Fill #0

## 2018-03-17 ENCOUNTER — Inpatient Hospital Stay: Payer: Medicare Other

## 2018-03-17 DIAGNOSIS — C7951 Secondary malignant neoplasm of bone: Principal | ICD-10-CM

## 2018-03-17 DIAGNOSIS — Z5111 Encounter for antineoplastic chemotherapy: Secondary | ICD-10-CM | POA: Diagnosis not present

## 2018-03-17 DIAGNOSIS — Z95828 Presence of other vascular implants and grafts: Secondary | ICD-10-CM

## 2018-03-17 DIAGNOSIS — C50912 Malignant neoplasm of unspecified site of left female breast: Secondary | ICD-10-CM

## 2018-03-17 LAB — CBC WITH DIFFERENTIAL (CANCER CENTER ONLY)
Abs Immature Granulocytes: 0.05 10*3/uL (ref 0.00–0.07)
Basophils Absolute: 0 10*3/uL (ref 0.0–0.1)
Basophils Relative: 0 %
EOS ABS: 0.5 10*3/uL (ref 0.0–0.5)
Eosinophils Relative: 7 %
HCT: 34.8 % — ABNORMAL LOW (ref 36.0–46.0)
Hemoglobin: 11 g/dL — ABNORMAL LOW (ref 12.0–15.0)
Immature Granulocytes: 1 %
Lymphocytes Relative: 10 %
Lymphs Abs: 0.8 10*3/uL (ref 0.7–4.0)
MCH: 28.1 pg (ref 26.0–34.0)
MCHC: 31.6 g/dL (ref 30.0–36.0)
MCV: 88.8 fL (ref 80.0–100.0)
Monocytes Absolute: 0.7 10*3/uL (ref 0.1–1.0)
Monocytes Relative: 9 %
Neutro Abs: 5.9 10*3/uL (ref 1.7–7.7)
Neutrophils Relative %: 73 %
Platelet Count: 179 10*3/uL (ref 150–400)
RBC: 3.92 MIL/uL (ref 3.87–5.11)
RDW: 19 % — ABNORMAL HIGH (ref 11.5–15.5)
WBC Count: 8 10*3/uL (ref 4.0–10.5)
nRBC: 0 % (ref 0.0–0.2)

## 2018-03-17 LAB — CMP (CANCER CENTER ONLY)
ALBUMIN: 3.2 g/dL — AB (ref 3.5–5.0)
ALT: 8 U/L (ref 0–44)
AST: 32 U/L (ref 15–41)
Alkaline Phosphatase: 287 U/L — ABNORMAL HIGH (ref 38–126)
Anion gap: 14 (ref 5–15)
BILIRUBIN TOTAL: 0.4 mg/dL (ref 0.3–1.2)
BUN: 17 mg/dL (ref 8–23)
CO2: 22 mmol/L (ref 22–32)
CREATININE: 1.18 mg/dL — AB (ref 0.44–1.00)
Calcium: 8.8 mg/dL — ABNORMAL LOW (ref 8.9–10.3)
Chloride: 104 mmol/L (ref 98–111)
GFR, Est AFR Am: 55 mL/min — ABNORMAL LOW (ref >60)
GFR, Estimated: 48 mL/min — ABNORMAL LOW (ref >60)
Glucose, Bld: 65 mg/dL — ABNORMAL LOW (ref 70–99)
Potassium: 3.5 mmol/L (ref 3.5–5.1)
Sodium: 140 mmol/L (ref 135–145)
Total Protein: 6.8 g/dL (ref 6.5–8.1)

## 2018-03-17 MED ORDER — HEPARIN SOD (PORK) LOCK FLUSH 100 UNIT/ML IV SOLN
500.0000 [IU] | Freq: Once | INTRAVENOUS | Status: AC
  Administered 2018-03-17: 500 [IU]
  Filled 2018-03-17: qty 5

## 2018-03-17 MED ORDER — SODIUM CHLORIDE 0.9% FLUSH
10.0000 mL | Freq: Once | INTRAVENOUS | Status: AC
  Administered 2018-03-17: 10 mL
  Filled 2018-03-17: qty 10

## 2018-03-18 LAB — CANCER ANTIGEN 27.29: CA 27.29: 280.5 U/mL — ABNORMAL HIGH (ref 0.0–38.6)

## 2018-03-27 ENCOUNTER — Ambulatory Visit (HOSPITAL_COMMUNITY)
Admission: RE | Admit: 2018-03-27 | Discharge: 2018-03-27 | Disposition: A | Discharge status: Home / Self Care | Payer: Medicare Other | Source: Ambulatory Visit | Attending: Nurse Practitioner | Admitting: Nurse Practitioner

## 2018-03-27 DIAGNOSIS — C7951 Secondary malignant neoplasm of bone: Secondary | ICD-10-CM | POA: Diagnosis present

## 2018-03-27 DIAGNOSIS — C50912 Malignant neoplasm of unspecified site of left female breast: Secondary | ICD-10-CM

## 2018-03-27 MED ORDER — HEPARIN SOD (PORK) LOCK FLUSH 100 UNIT/ML IV SOLN
INTRAVENOUS | Status: AC
  Filled 2018-03-27: qty 5

## 2018-03-27 MED ORDER — GADOBUTROL 1 MMOL/ML IV SOLN
7.5000 mL | Freq: Once | INTRAVENOUS | Status: AC | PRN
  Administered 2018-03-27: 7.5 mL via INTRAVENOUS

## 2018-03-27 MED ORDER — HEPARIN SOD (PORK) LOCK FLUSH 10 UNIT/ML IV SOLN
10.0000 [IU] | Freq: Once | INTRAVENOUS | Status: DC
  Filled 2018-03-27: qty 1

## 2018-03-28 ENCOUNTER — Telehealth: Payer: Self-pay | Admitting: *Deleted

## 2018-03-28 NOTE — Telephone Encounter (Signed)
Spoke with Opal Sidles @ [facility] re:  MRI results done  03/27/18.  Results printed and gave to Dr. Benay Spice for review. [facility]   Phone    [phone].

## 2018-03-31 ENCOUNTER — Inpatient Hospital Stay: Payer: Medicare Other

## 2018-03-31 ENCOUNTER — Other Ambulatory Visit: Payer: Self-pay

## 2018-03-31 ENCOUNTER — Inpatient Hospital Stay: Payer: Medicare Other | Attending: Oncology | Admitting: Oncology

## 2018-03-31 ENCOUNTER — Ambulatory Visit (HOSPITAL_COMMUNITY)
Admission: RE | Admit: 2018-03-31 | Discharge: 2018-03-31 | Disposition: A | Discharge status: Home / Self Care | Payer: Medicare Other | Source: Ambulatory Visit | Attending: Oncology | Admitting: Oncology

## 2018-03-31 ENCOUNTER — Telehealth: Payer: Self-pay | Admitting: Oncology

## 2018-03-31 VITALS — BP 156/81 | HR 86 | Temp 97.8°F | Resp 19 | Ht 62.0 in | Wt 168.6 lb

## 2018-03-31 DIAGNOSIS — C50912 Malignant neoplasm of unspecified site of left female breast: Secondary | ICD-10-CM | POA: Diagnosis not present

## 2018-03-31 DIAGNOSIS — E119 Type 2 diabetes mellitus without complications: Secondary | ICD-10-CM | POA: Insufficient documentation

## 2018-03-31 DIAGNOSIS — I10 Essential (primary) hypertension: Secondary | ICD-10-CM | POA: Diagnosis not present

## 2018-03-31 DIAGNOSIS — C773 Secondary and unspecified malignant neoplasm of axilla and upper limb lymph nodes: Secondary | ICD-10-CM | POA: Diagnosis not present

## 2018-03-31 DIAGNOSIS — C7951 Secondary malignant neoplasm of bone: Secondary | ICD-10-CM | POA: Insufficient documentation

## 2018-03-31 DIAGNOSIS — M069 Rheumatoid arthritis, unspecified: Secondary | ICD-10-CM | POA: Diagnosis not present

## 2018-03-31 DIAGNOSIS — C787 Secondary malignant neoplasm of liver and intrahepatic bile duct: Secondary | ICD-10-CM | POA: Diagnosis not present

## 2018-03-31 DIAGNOSIS — M25552 Pain in left hip: Secondary | ICD-10-CM

## 2018-03-31 DIAGNOSIS — Z95828 Presence of other vascular implants and grafts: Secondary | ICD-10-CM

## 2018-03-31 LAB — CMP (CANCER CENTER ONLY)
ALT: 9 U/L (ref 0–44)
ANION GAP: 16 — AB (ref 5–15)
AST: 39 U/L (ref 15–41)
Albumin: 3 g/dL — ABNORMAL LOW (ref 3.5–5.0)
Alkaline Phosphatase: 260 U/L — ABNORMAL HIGH (ref 38–126)
BUN: 18 mg/dL (ref 8–23)
CO2: 22 mmol/L (ref 22–32)
Calcium: 8.2 mg/dL — ABNORMAL LOW (ref 8.9–10.3)
Chloride: 101 mmol/L (ref 98–111)
Creatinine: 1.44 mg/dL — ABNORMAL HIGH (ref 0.44–1.00)
GFR, Est AFR Am: 43 mL/min — ABNORMAL LOW (ref >60)
GFR, Estimated: 37 mL/min — ABNORMAL LOW (ref >60)
Glucose, Bld: 167 mg/dL — ABNORMAL HIGH (ref 70–99)
Potassium: 3.4 mmol/L — ABNORMAL LOW (ref 3.5–5.1)
Sodium: 139 mmol/L (ref 135–145)
Total Bilirubin: 0.4 mg/dL (ref 0.3–1.2)
Total Protein: 6.6 g/dL (ref 6.5–8.1)

## 2018-03-31 LAB — CBC WITH DIFFERENTIAL (CANCER CENTER ONLY)
ABS IMMATURE GRANULOCYTES: 0.08 10*3/uL — AB (ref 0.00–0.07)
BASOS PCT: 0 %
Basophils Absolute: 0 10*3/uL (ref 0.0–0.1)
Eosinophils Absolute: 0.4 10*3/uL (ref 0.0–0.5)
Eosinophils Relative: 6 %
HCT: 36.3 % (ref 36.0–46.0)
Hemoglobin: 11.2 g/dL — ABNORMAL LOW (ref 12.0–15.0)
Immature Granulocytes: 1 %
Lymphocytes Relative: 16 %
Lymphs Abs: 1.2 10*3/uL (ref 0.7–4.0)
MCH: 28.4 pg (ref 26.0–34.0)
MCHC: 30.9 g/dL (ref 30.0–36.0)
MCV: 91.9 fL (ref 80.0–100.0)
Monocytes Absolute: 0.6 10*3/uL (ref 0.1–1.0)
Monocytes Relative: 8 %
NEUTROS PCT: 69 %
Neutro Abs: 5.2 10*3/uL (ref 1.7–7.7)
Platelet Count: 162 10*3/uL (ref 150–400)
RBC: 3.95 MIL/uL (ref 3.87–5.11)
RDW: 19.5 % — ABNORMAL HIGH (ref 11.5–15.5)
WBC Count: 7.5 10*3/uL (ref 4.0–10.5)
nRBC: 0 % (ref 0.0–0.2)

## 2018-03-31 MED ORDER — SODIUM CHLORIDE 0.9% FLUSH
10.0000 mL | Freq: Once | INTRAVENOUS | Status: AC
  Administered 2018-03-31: 10 mL
  Filled 2018-03-31: qty 10

## 2018-03-31 MED ORDER — HEPARIN SOD (PORK) LOCK FLUSH 100 UNIT/ML IV SOLN
500.0000 [IU] | Freq: Once | INTRAVENOUS | Status: AC
  Administered 2018-03-31: 500 [IU]
  Filled 2018-03-31: qty 5

## 2018-03-31 NOTE — Progress Notes (Signed)
[place] OFFICE PROGRESS NOTE   Diagnosis: Breast cancer  INTERVAL HISTORY:   [name] returns for a scheduled visit.  She has continued Faslodex and piqray.  She reports stable pain, controlled with OxyContin and oxycodone.  She takes oxycodone 3-4 times per day.  She has noted intermittent discomfort at the "hip ".  The discomfort is present with certain movements.  No consistent pain at the left hip. Her blood sugar has been running at less than 150.  Good appetite.  She is here today with her sister.  She continues to live alone.  Objective:  Vital signs in last 24 hours:  Blood pressure (!) 156/81, pulse 86, temperature 97.8 F (36.6 C), temperature source Oral, resp. rate 19, height '5\' 2"'  (1.575 m), weight 168 lb 9.6 oz (76.5 kg), SpO2 95 %.    HEENT: No thrush or ulcers, mild angular colitis Resp: Lungs clear bilaterally, dullness at the lower posterior chest bilaterally Cardio: Regular rate and rhythm GI: No hepatomegaly, nontender Vascular: No leg edema Musculoskeletal: No pain with motion at the left hip  Portacath/PICC-without erythema  Lab Results:  Lab Results  Component Value Date   WBC 7.5 03/31/2018   HGB 11.2 (L) 03/31/2018   HCT 36.3 03/31/2018   MCV 91.9 03/31/2018   PLT 162 03/31/2018   NEUTROABS 5.2 03/31/2018    CMP  Lab Results  Component Value Date   NA 139 03/31/2018   K 3.4 (L) 03/31/2018   CL 101 03/31/2018   CO2 22 03/31/2018   GLUCOSE 167 (H) 03/31/2018   BUN 18 03/31/2018   CREATININE 1.44 (H) 03/31/2018   CALCIUM 8.2 (L) 03/31/2018   PROT 6.6 03/31/2018   ALBUMIN 3.0 (L) 03/31/2018   AST 39 03/31/2018   ALT 9 03/31/2018   ALKPHOS 260 (H) 03/31/2018   BILITOT 0.4 03/31/2018   GFRNONAA 37 (L) 03/31/2018   GFRAA 43 (L) 03/31/2018     Imaging:  Mr Liver W Wo Contrast  Result Date: 03/27/2018 CLINICAL DATA:  Breast cancer with liver metastasis. Skeletal metastasis EXAM: MRI ABDOMEN WITHOUT AND WITH  CONTRAST TECHNIQUE: Multiplanar multisequence MR imaging of the abdomen was performed both before and after the administration of intravenous contrast. CONTRAST:  7.5 mL Gadavist COMPARISON:  Abdominal MRI 12/09/2017 FINDINGS: Lower chest: There are new bilateral pleural effusions. Effusions are moderate in volume. Several pulmonary nodules are present in the RIGHT lower lobe (image 1/3 and 4/3 for example). These nodules are better seen on the post-contrast enhanced series (series 904) particularly in the RIGHT middle lobe (image 23/904) and are increased in size from comparison exam Hepatobiliary: Unfortunately, there is advancement of the hepatic metastasis. Lesions are well seen on the T2 weighted series (series 3). There new and enlarged round lesions within LEFT and RIGHT hepatic lobe. Lesions number approximately 40 in total compared to approximately 10 lesions comparison exam. Large lesion in the RIGHT hepatic lobe is increased measuring 4.6 cm by 3.7 cm (image 21/3) compared with 4.5 cm x 3.2 cm. However there are 2 adjacent new nodules measuring 2.0 cm each (image 22/3) In the dome the liver multiple new lesions including 2.1 cm lesion on image 9/3. Similar findings in the LEFT lateral hepatic lobe with new round lesions measuring to 3. 5 cm These lesions demonstrate targetoid enhancement (series 904). Pancreas: Normal pancreatic parenchymal intensity. No ductal dilatation or inflammation. Spleen: Normal spleen. Adrenals/urinary tract: Adrenal glands and kidneys are normal. Stomach/Bowel: Stomach and limited of the small  bowel is unremarkable Vascular/Lymphatic: Abdominal aortic normal caliber. No retroperitoneal periportal lymphadenopathy. Musculoskeletal: Multiple enhancing lesions in the spine unchanged from comparison exam. IMPRESSION: 1. Unfortunately there is progression of hepatic metastasis with multiple new round enhancing lesions and enlargement of previous seen lesions. 2. Interval development  of moderate bilateral pleural effusions. 3. Bilateral pulmonary nodules at the lung bases are increased. 4. Stable spine metastasis. These results will be called to the ordering clinician or representative by the Radiologist Assistant, and communication documented in the PACS or zVision Dashboard. Electronically Signed   By: Suzy Bouchard M.D.   On: 03/27/2018 17:32    Medications: I have reviewed the patient's current medications.   Assessment/Plan: 1. 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 ER positive, PR positive, HER-2 negative She completed 4 cycles of adjuvant AC chemotherapy June 15 through September 03, 2008, and then completed the left chest wall radiation. She began Arimidex following an office visit on November 11, 2008.  Bone scan 06/01/2013 suggestive of thoracic spine metastases, thoracic MRI 06/27/2011 consistent with multiple bone metastases involving the thoracolumbar spine   PET scan 07/09/2013 with multiple hypermetabolic bone lesions and hypermetabolic mediastinal nodes.   Left iliac lesion biopsy 07/26/2013. Pathology showed metastatic carcinoma, ER positive, PR positive, HER-2 negative   Initiation of Faslodex 08/10/2013.  Xgeva every 3 months initiated 07/27/2013.  Restaging PET scan 01/23/2014 with no residual hypermetabolic mediastinal disease; marked improvement in the metastatic bone disease.  Restaging PET scan 02/17/2015 with recurrence of skeletal metastasis. New hypermetabolic lesions within the pelvis and ribs. No hypermetabolic lymphadenopathy.  Continuation of Faslodex; initiation of Ibrance 03/05/2015  Cycle 2 Ibrance 04/09/2015-dose reduced to 100 mg daily  Cycle 3 Ibrance 05/08/2015  Cycle 4 Ibrance 06/11/2015  Cycle 5 Ibrance 07/15/2015  PET scan 08/12/2015-increase in size and increased metabolic activity  associated with bone metastases, no new lesions  Ibrance/Faslodex discontinued  Tamoxifen started 08/07/2015  PET scan 02/04/2016 with new hypermetabolic liver lesions and numerous new skeletal lesions.  Xeloda, 7 days on/7 days off initiated February 2018  Xeloda dose reduced secondary to hand/foot syndrome beginning on 05/04/2016  Restaging MRI liver 05/18/2016-2 dominant hepatic metastases measuring up to 13 mm favored to be improved.  Xeloda continued  PET scan 11/12/2016-significant improvement with complete resolution of multiple hepatic hypermetabolic lesions and most of the prior bony hypermetabolic lesions. The remaining hypermetabolic hepatic and bony lesions are smaller and/or demonstrate significantly reduced activity.  Xeloda continued  MRI abdomen 05/16/2017-mixed response to therapy. 1 previously noted liver lesion in segment 3 completely resolved; other previously noted hepatic lesion increased significantly in size. 2 new liver lesions. Previously noted lesions in the lumbar spine similar.  Cycle 1 weekly Taxol 05/30/2017  Cycle 2 weekly Taxol 06/27/2017  Cycle 3 weekly Taxol 07/25/2017  MRI abdomen7/31/2019-3 previously noted hepatic lesions appear stable to slightly decreased in size; one new hepatic lesion measuring 1.2 x 0.9 cm. Widespread metastatic disease to the bones redemonstrated.  Taxol continued every 2 weeks  MRI abdomen 12/09/2017-liver metastases increased in size and number. Several new pulmonary nodules at the lung bases bilaterally. Widespread bone metastases appear stable.  Biopsy of right liver lesion 12/19/2017, metastatic adenocarcinoma, ER positive, PR positive, HER-2 negative, MSS, tumor mutation burden-8, PI K3CA alteration  Faslodex (01/06/2018)/piqray(01/09/2018)  piqraydose decreased to 150 mg daily 01/20/2018  MRI liver 03/27/2018- new and enlarged liver lesions, bilateral pleural  effusions, bilateral pulmonary nodules-increased in  size 2. Left chest subcutaneous mass noted on exam October 04, 2008, status post biopsy by Dr. Brantley Stage with a benign pathology. 3. History of delayed healing of the left mastectomy incision. 4. Rheumatoid arthritis. 5. Diabetes. 6. Hypercholesterolemia. 7. Hypertension. 8. Family history of breast cancer. She has been evaluated at the genetic screening clinic. 9. 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 10. 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 Hand/foot syndrome secondary to Xeloda-Xeloda dose reduced 05/04/2016   Disposition: [name] has metastatic breast cancer.  She has been maintained on piqray/Faslodex since December 2019.  The CA 27-29 was higher last month.  The restaging MRI confirms disease progression in the liver and lungs.  I discussed the MRI findings with Ms. Sciarra and her sister.  I reviewed the MRI images.  We discussed treatment options including supportive care versus a trial of salvage systemic therapy.  Her performance status has declined.  She would like to discontinue systemic therapy.  She agrees to a [facility] referral.  We discussed CPR and ACLS issues.  She will placed on a no CODE BLUE status.  We will refer her for a plain x-ray of the left hip to rule out an impending fracture.  She will return for an office visit in 3 weeks.  She will continue the current narcotic pain regimen.  Betsy Coder, MD  03/31/2018  11:45 AM

## 2018-03-31 NOTE — Telephone Encounter (Signed)
Gave avs and calendar ° °

## 2018-03-31 NOTE — Progress Notes (Signed)
Called to referral center for [facility]) and requested admission to their care. They will call patient to arrange first visit.

## 2018-04-07 ENCOUNTER — Telehealth: Payer: Self-pay | Admitting: *Deleted

## 2018-04-07 NOTE — Telephone Encounter (Signed)
Received call from pt's hospice nurse with an update. Pt is declining as evidenced by S02 87%, T 99.6, pulse 115. Pt is becoming anxious and with increased pain, some confusion. Pt is not able to be cared for at home therefore is being transferred to [facility] tomorrow for end of life care.

## 2018-04-12 ENCOUNTER — Telehealth: Payer: Self-pay | Admitting: Nurse Practitioner

## 2018-04-12 NOTE — Telephone Encounter (Signed)
Called per 3/23 VM scheduling log.  Patient is in hospice care/  Cancelled appt per patient sister request.

## 2018-04-19 ENCOUNTER — Encounter: Payer: Self-pay | Admitting: Nurse Practitioner

## 2018-04-19 DEATH — deceased

## 2018-04-21 ENCOUNTER — Ambulatory Visit: Payer: Medicare Other | Admitting: Nurse Practitioner

## 2020-04-09 IMAGING — MR MR ABDOMEN WO/W CM
11 of 17 series · 28 of 48 positions shown · IV contrast (10 ML MULTIHANCE)
Comparison: MRI of the abdomen 05/18/2016.

CLINICAL DATA: 67-year-old female with history of breast cancer
diagnosed in 8151. Hepatic lesions. Follow-up study.

EXAM:
MRI ABDOMEN WITHOUT AND WITH CONTRAST
TECHNIQUE: Multiplanar multisequence MR imaging of the abdomen was performed
both before and after the administration of intravenous contrast.
CONTRAST:  10mL MULTIHANCE GADOBENATE DIMEGLUMINE 529 MG/ML IV SOLN

[Series 3: T2 · coronal · 5.0mm · 1.45mm/px · 2 of 31 slices shown (1 of 3)]
[im 1/31]
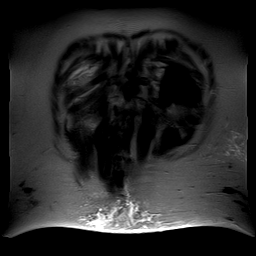
[im 31/31]
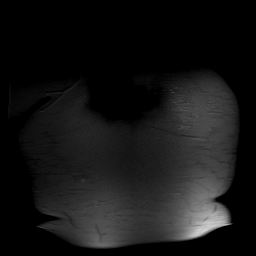

[Series 4: T2 · axial · 5.0mm · 1.41mm/px · z∈[-57,+187]mm · 2 of 40 slices shown (2 of 3)]
[im 1/40]
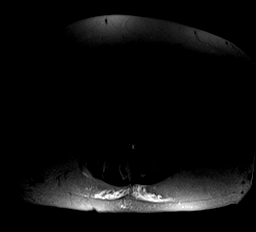
[im 40/40]
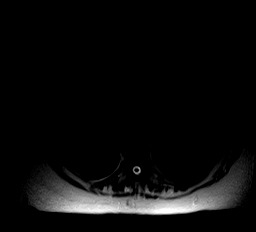

[Series 5: axial in out · axial · 5.5mm · 0.70mm/px · z∈[-58,+188]mm · 4 of 80 slices shown]
[im 1/80]
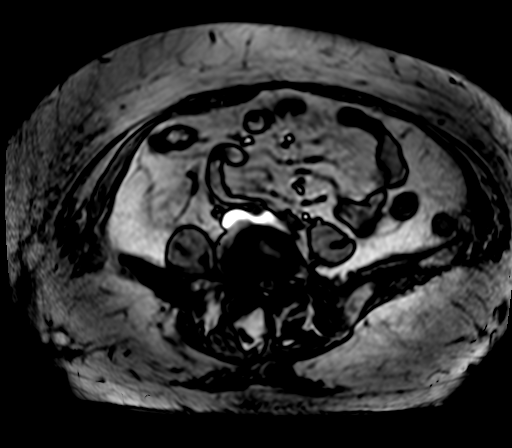
[im 27/80]
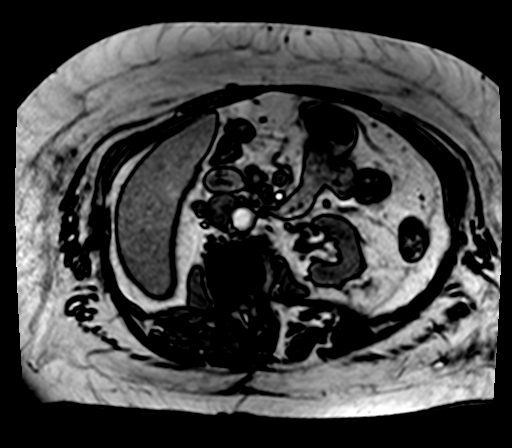
[im 53/80]
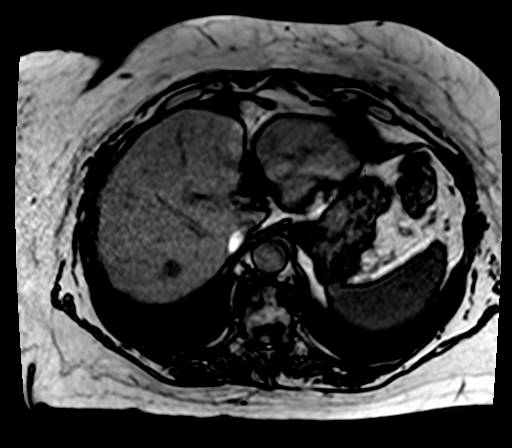
[im 80/80]
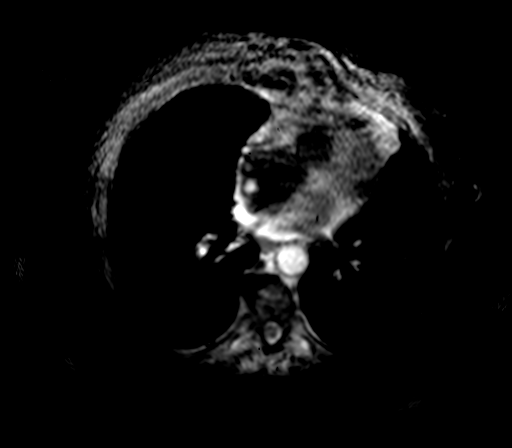

[Series 6: axial tru fisp · axial · 5.0mm · 1.41mm/px · z∈[-47,+212]mm · 3 of 46 slices shown]
[im 1/46]
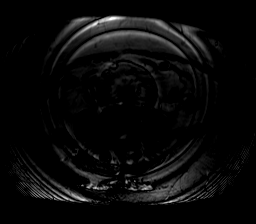
[im 23/46]
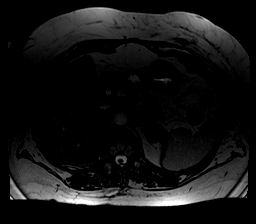
[im 46/46]
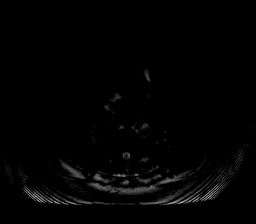

[Series 7: T2 · axial · 5.0mm · 0.74mm/px · z∈[-44,+200]mm · 2 of 40 slices shown (3 of 3)]
[im 1/40]
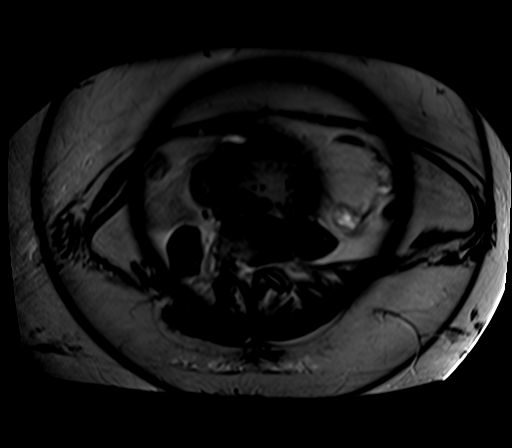
[im 40/40]
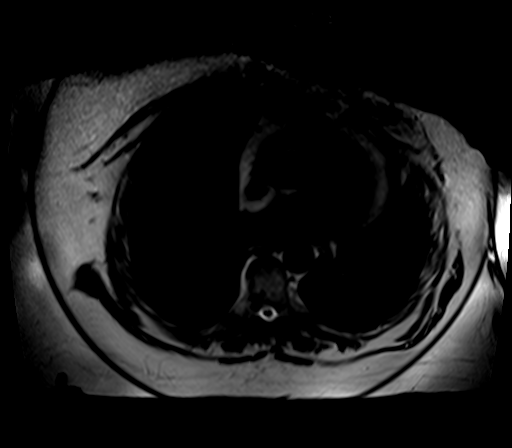

[Series 8: [id number]_trig · axial · 5.0mm · 1.88mm/px · z∈[-44,+200]mm · 4 of 120 slices shown]
[im 1/120]
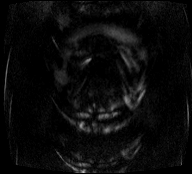
[im 40/120]
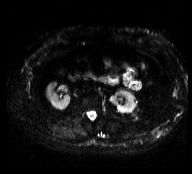
[im 80/120]
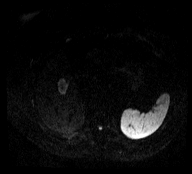
[im 120/120]
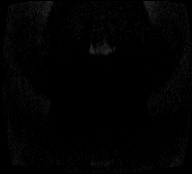

[Series 9: [id number]_trig_adc · axial · 5.0mm · 1.88mm/px · 1 of 40 slices shown]
[im 1/40]
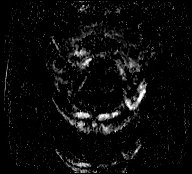

[Series 10: T1 dynamic · axial · non-contrast · 2.3mm · 0.78mm/px · z∈[-34,+203]mm · 3 of 104 slices shown]
[im 1/104]
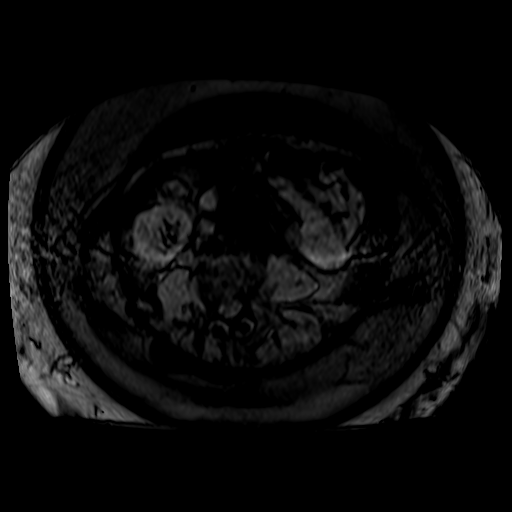
[im 52/104]
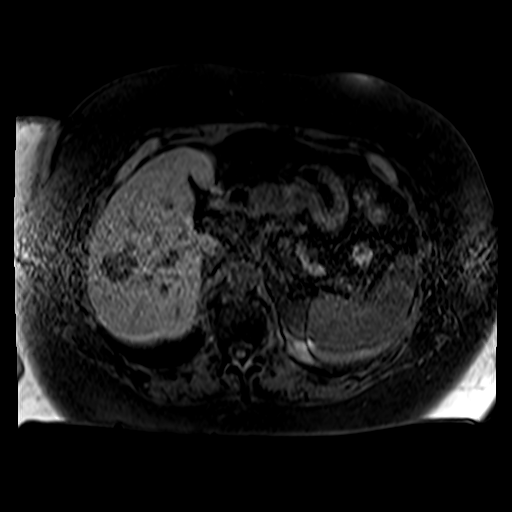
[im 104/104]
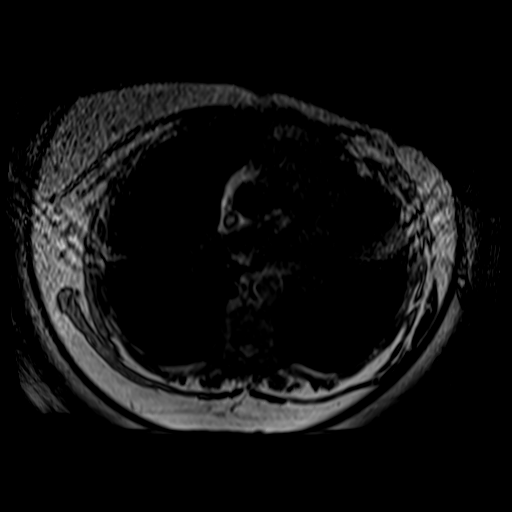

[Series 11: post 25 sec · axial · 2.3mm · 0.78mm/px · z∈[-34,+203]mm · 3 of 104 slices shown]
[im 1/104]
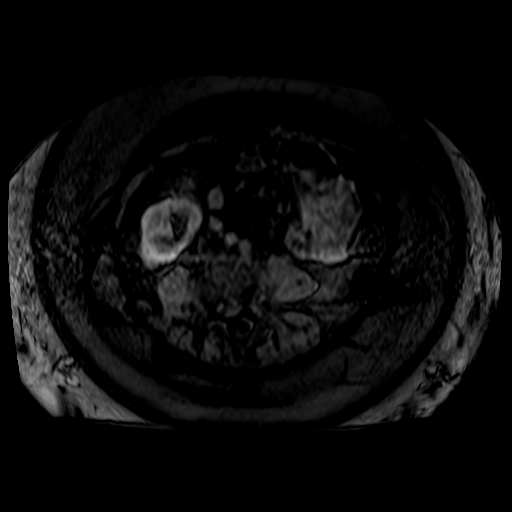
[im 52/104]
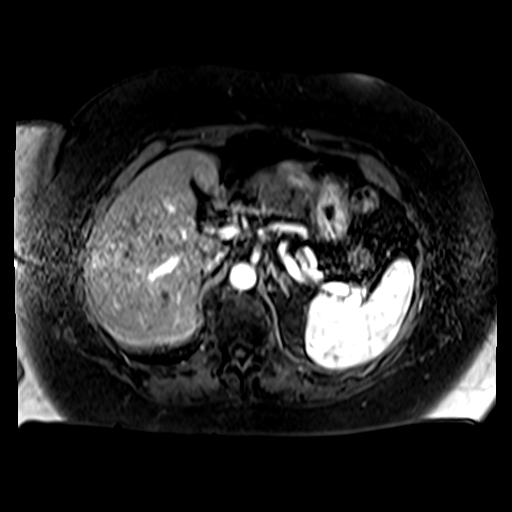
[im 104/104]
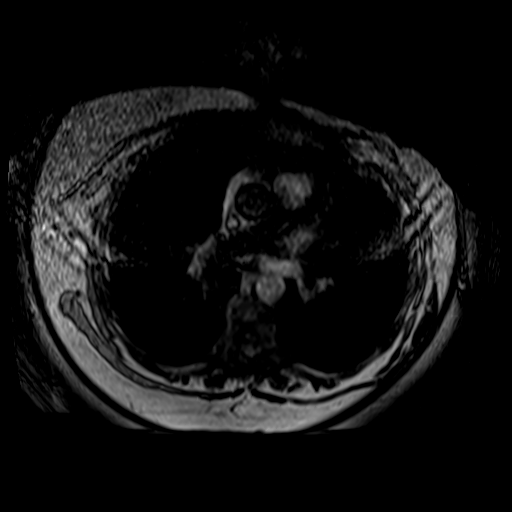

[Series 12: post 25 sec_sub · axial · 2.3mm · 0.78mm/px · z∈[-34,+203]mm · 3 of 104 slices shown]
[im 1/104]
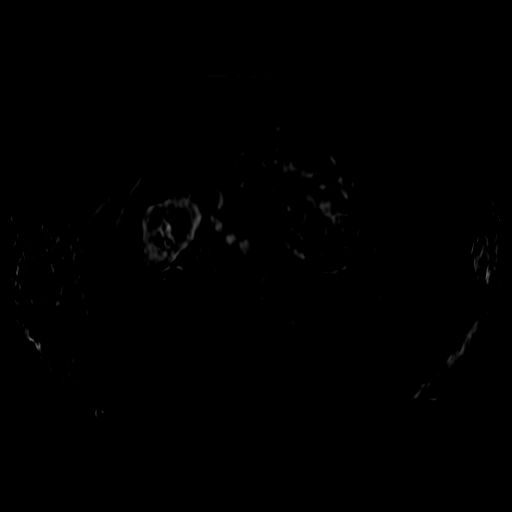
[im 52/104]
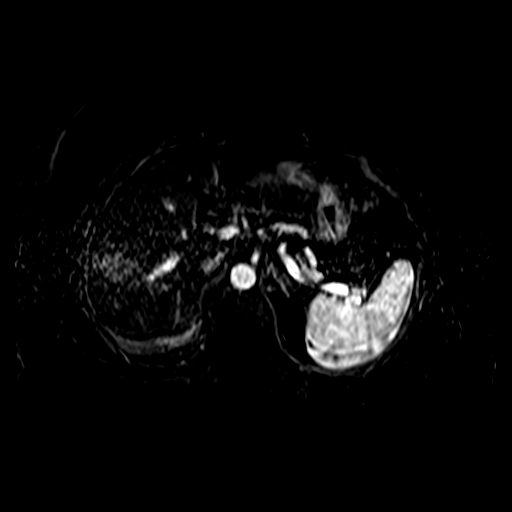
[im 104/104]
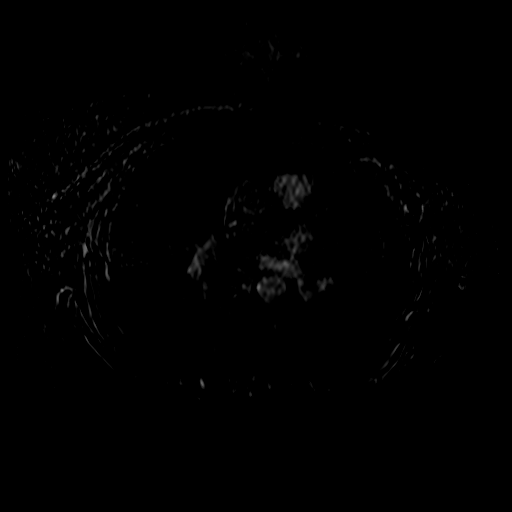

[Series 13: post 45 sec · axial · 2.3mm · 0.78mm/px · 1 of 104 slices shown]
[im 1/104]
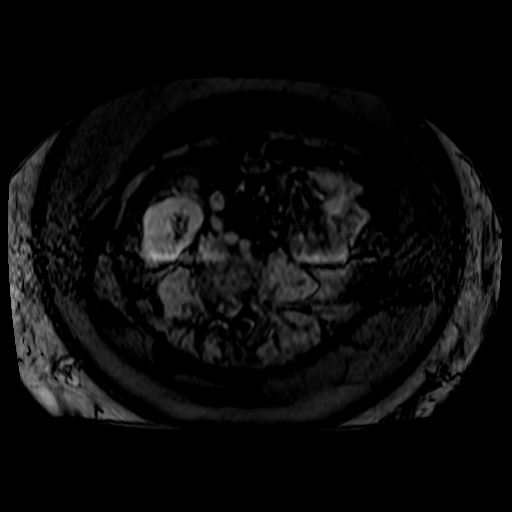

[28 of 48 positions shown; findings below may reference images not displayed]

FINDINGS: Lower chest: Postoperative changes of left mastectomy.

Hepatobiliary: Compared to the prior study, one of the two
previously noted liver lesions have increased in size, one has
resolved entirely, and 2 new liver lesion are noted. Specifically,
lesions include a 3.1 x 3.9 cm lesion centered in segment 5 (axial
image 49 of series 13) which previously measured only 13 mm on
05/18/2016, a new 3.4 x 2.2 cm lesion centered more centrally in
segment 5 (axial image 60 of series 13), and a new 12 x 15 mm lesion
centered in segment 7 (axial image 41 of series 13). Previously
noted lesion in segment 3 of the liver has completely resolved
compared to prior examinations. No intra or extrahepatic biliary
ductal dilatation. Status post cholecystectomy.

Pancreas: No pancreatic mass. No pancreatic ductal dilatation. No
pancreatic or peripancreatic fluid or inflammatory changes.

Spleen:  Unremarkable.

Adrenals/Urinary Tract: Bilateral kidneys and bilateral adrenal
glands are normal. No hydroureteronephrosis in the visualized
portions of the abdomen.

Stomach/Bowel: Visualized portions are unremarkable.

Vascular/Lymphatic: No aneurysm identified in the visualized
abdominal vasculature. No lymphadenopathy noted in the abdomen.

Other: No significant volume of ascites noted in the visualized
portions of the peritoneal cavity.

Musculoskeletal: Multiple areas of signal abnormality are again
noted throughout the visualized axial skeleton, which likely reflect
a combination of active and treated metastases. In the lumbar spine
there are enhancing lesions in L3 and L5, similar to the prior
examination.
IMPRESSION: 1. Today's study demonstrates a mixed response to therapy. While one
of the previously noted hepatic lesions in segment 3 of the liver
has completely resolved, the other previously noted hepatic lesion
has increased significantly in size. Additionally, there are 2 new
hepatic lesions, compatible with progressive metastatic disease to
the liver.
2. Previously noted enhancing osseous lesions in the lumbar spine
appears similar to the prior examination.

## 2020-04-18 IMAGING — XA IR FLUORO GUIDE CV LINE*R*
1 series · 1 of 1 positions shown · non-contrast
Comparison: none

CLINICAL DATA: Metastatic breast carcinoma, needs durable venous
access for chemotherapy
TECHNIQUE: The procedure, risks, benefits, and alternatives were explained to
the patient. Questions regarding the procedure were encouraged and
answered. The patient understands and consents to the procedure. As
antibiotic prophylaxis, cefazolin 2 g was ordered pre-procedure and
administered intravenously within one hour of incision. Patency of
the right IJ vein was confirmed with ultrasound with image
documentation. An appropriate skin site was determined. Skin site
was marked. Region was prepped using maximum barrier technique
including cap and mask, sterile gown, sterile gloves, large sterile
sheet, and Chlorhexidine as cutaneous antisepsis. The region was
infiltrated locally with 1% lidocaine. Under real-time ultrasound
guidance, the right IJ vein was accessed with a 21 gauge
micropuncture needle; the needle tip within the vein was confirmed
with ultrasound image documentation. Needle was exchanged over a 018
guidewire for transitional dilator which allowed passage of the
Benson wire into the IVC. Over this, the transitional dilator was
exchanged for a 5 French MPA catheter. A small incision was made on
the right anterior chest wall and a subcutaneous pocket fashioned.
The power-injectable port was positioned and its catheter tunneled
to the right IJ dermatotomy site. The MPA catheter was exchanged
over an Amplatz wire for a peel-away sheath, through which the port
catheter, which had been trimmed to the appropriate length, was
advanced and positioned under fluoroscopy with its tip at the
cavoatrial junction. Spot chest radiograph confirms good catheter
position and no pneumothorax. The pocket was closed with deep
interrupted and subcuticular continuous 3-0 Monocryl sutures. The
port was flushed per protocol. The incisions were covered with
Dermabond then covered with a sterile dressing.

COMPLICATIONS:
COMPLICATIONS
None immediate

[Series 300: line placements · 1 of 1 slices shown]
[im 1/1]
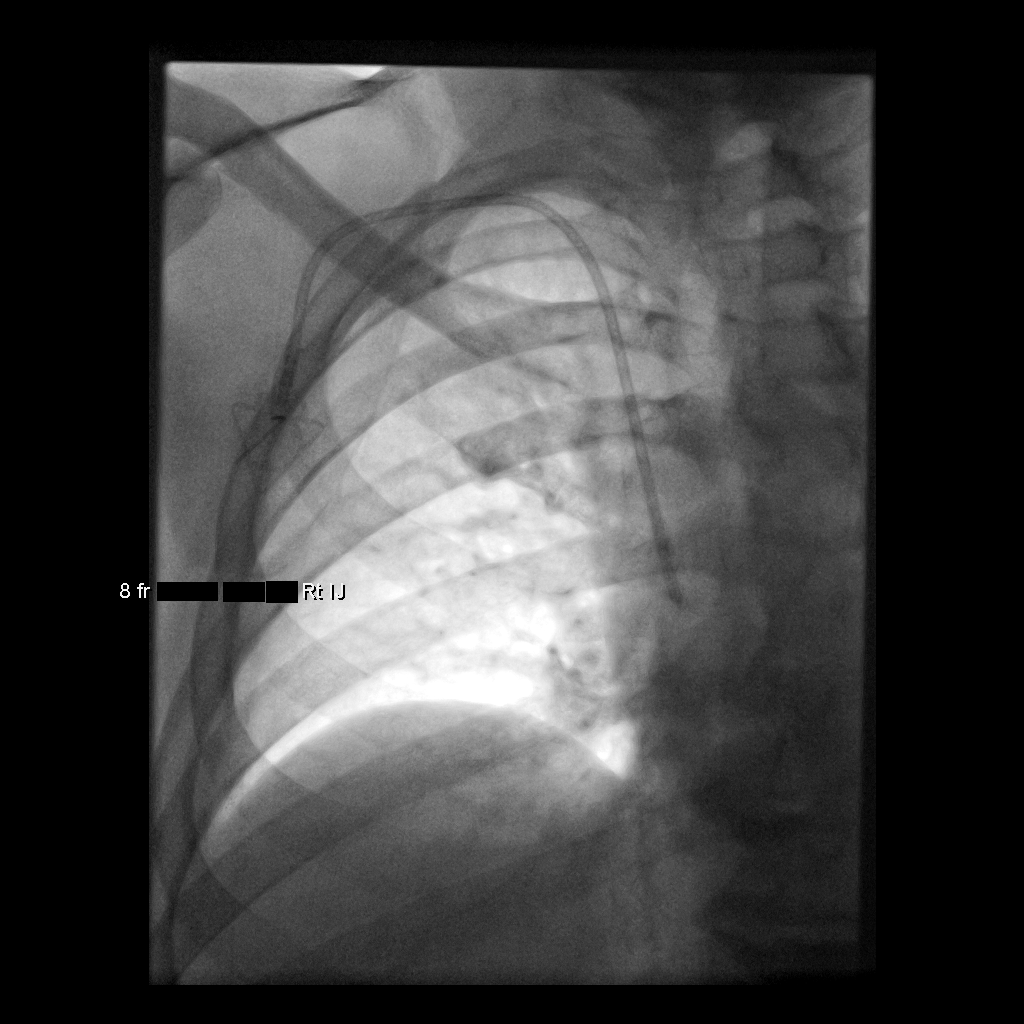

[1 of 1 positions shown; findings below may reference images not displayed]

EXAM:
TUNNELED PORT CATHETER PLACEMENT WITH ULTRASOUND AND FLUOROSCOPIC
GUIDANCE

FLUOROSCOPY TIME:  0.2 minute; 45  uCymP DAP

ANESTHESIA/SEDATION:
Intravenous Fentanyl and Versed were administered as conscious
sedation during continuous monitoring of the patient's level of
consciousness and physiological / cardiorespiratory status by the
radiology RN, with a total moderate sedation time of 16 minutes.
IMPRESSION: Technically successful right IJ power-injectable port catheter
placement. Ready for routine use.

## 2020-09-03 ENCOUNTER — Encounter: Payer: Self-pay | Admitting: Nurse Practitioner
